# Patient Record
Sex: Female | Born: 1945
Health system: Southern US, Community
[De-identification: ages and names within clinical notes are randomized; demographics above are authoritative.]

## PROBLEM LIST (undated history)

## (undated) DIAGNOSIS — F988 Other specified behavioral and emotional disorders with onset usually occurring in childhood and adolescence: Secondary | ICD-10-CM

## (undated) DIAGNOSIS — F32A Depression, unspecified: Secondary | ICD-10-CM

## (undated) DIAGNOSIS — D649 Anemia, unspecified: Secondary | ICD-10-CM

## (undated) DIAGNOSIS — B009 Herpesviral infection, unspecified: Secondary | ICD-10-CM

## (undated) DIAGNOSIS — E785 Hyperlipidemia, unspecified: Secondary | ICD-10-CM

## (undated) DIAGNOSIS — H04123 Dry eye syndrome of bilateral lacrimal glands: Secondary | ICD-10-CM

## (undated) DIAGNOSIS — T7840XA Allergy, unspecified, initial encounter: Secondary | ICD-10-CM

## (undated) DIAGNOSIS — F329 Major depressive disorder, single episode, unspecified: Secondary | ICD-10-CM

## (undated) DIAGNOSIS — L718 Other rosacea: Secondary | ICD-10-CM

## (undated) DIAGNOSIS — C801 Malignant (primary) neoplasm, unspecified: Secondary | ICD-10-CM

## (undated) DIAGNOSIS — M8580 Other specified disorders of bone density and structure, unspecified site: Secondary | ICD-10-CM

## (undated) DIAGNOSIS — F419 Anxiety disorder, unspecified: Secondary | ICD-10-CM

## (undated) DIAGNOSIS — Z923 Personal history of irradiation: Secondary | ICD-10-CM

## (undated) DIAGNOSIS — M51369 Other intervertebral disc degeneration, lumbar region without mention of lumbar back pain or lower extremity pain: Secondary | ICD-10-CM

## (undated) DIAGNOSIS — K219 Gastro-esophageal reflux disease without esophagitis: Secondary | ICD-10-CM

## (undated) DIAGNOSIS — M5136 Other intervertebral disc degeneration, lumbar region: Secondary | ICD-10-CM

## (undated) HISTORY — DX: Allergy, unspecified, initial encounter: T78.40XA

## (undated) HISTORY — PX: TONSILLECTOMY AND ADENOIDECTOMY: SUR1326

## (undated) HISTORY — PX: SKIN BIOPSY: SHX1

## (undated) HISTORY — DX: Other intervertebral disc degeneration, lumbar region without mention of lumbar back pain or lower extremity pain: M51.369

## (undated) HISTORY — DX: Depression, unspecified: F32.A

## (undated) HISTORY — DX: Other specified disorders of bone density and structure, unspecified site: M85.80

## (undated) HISTORY — DX: Major depressive disorder, single episode, unspecified: F32.9

## (undated) HISTORY — DX: Anemia, unspecified: D64.9

## (undated) HISTORY — DX: Malignant (primary) neoplasm, unspecified: C80.1

## (undated) HISTORY — DX: Other specified behavioral and emotional disorders with onset usually occurring in childhood and adolescence: F98.8

## (undated) HISTORY — DX: Other rosacea: L71.8

## (undated) HISTORY — DX: Dry eye syndrome of bilateral lacrimal glands: H04.123

## (undated) HISTORY — PX: UPPER GI ENDOSCOPY: SHX6162

## (undated) HISTORY — DX: Herpesviral infection, unspecified: B00.9

## (undated) HISTORY — DX: Anxiety disorder, unspecified: F41.9

## (undated) HISTORY — DX: Other intervertebral disc degeneration, lumbar region: M51.36

## (undated) HISTORY — DX: Gastro-esophageal reflux disease without esophagitis: K21.9

---

## 2002-01-16 ENCOUNTER — Other Ambulatory Visit: Admission: RE | Admit: 2002-01-16 | Discharge: 2002-01-16 | Payer: Self-pay | Admitting: Gynecology

## 2003-03-28 ENCOUNTER — Other Ambulatory Visit: Admission: RE | Admit: 2003-03-28 | Discharge: 2003-03-28 | Payer: Self-pay | Admitting: Gynecology

## 2004-05-10 ENCOUNTER — Other Ambulatory Visit: Admission: RE | Admit: 2004-05-10 | Discharge: 2004-05-10 | Payer: Self-pay | Admitting: Obstetrics and Gynecology

## 2004-06-14 LAB — HM COLONOSCOPY

## 2005-05-30 ENCOUNTER — Other Ambulatory Visit: Admission: RE | Admit: 2005-05-30 | Discharge: 2005-05-30 | Payer: Self-pay | Admitting: Obstetrics and Gynecology

## 2005-12-22 ENCOUNTER — Ambulatory Visit (HOSPITAL_COMMUNITY): Admission: RE | Admit: 2005-12-22 | Discharge: 2005-12-22 | Payer: Self-pay | Admitting: Internal Medicine

## 2011-09-18 ENCOUNTER — Emergency Department (HOSPITAL_COMMUNITY)
Admission: EM | Admit: 2011-09-18 | Discharge: 2011-09-19 | Disposition: A | Discharge status: Home / Self Care | Payer: Self-pay | Attending: Emergency Medicine | Admitting: Emergency Medicine

## 2011-09-18 DIAGNOSIS — L259 Unspecified contact dermatitis, unspecified cause: Secondary | ICD-10-CM | POA: Insufficient documentation

## 2011-09-19 MED FILL — Prednisone Tab 20 MG: ORAL | Qty: 3 | Status: AC

## 2011-09-19 NOTE — ED Notes (Signed)
See downtime charting. 

## 2011-12-28 ENCOUNTER — Ambulatory Visit (HOSPITAL_COMMUNITY)
Admission: RE | Admit: 2011-12-28 | Discharge: 2011-12-28 | Disposition: A | Discharge status: Home / Self Care | Payer: 59 | Source: Ambulatory Visit | Attending: Internal Medicine | Admitting: Internal Medicine

## 2011-12-28 ENCOUNTER — Other Ambulatory Visit (HOSPITAL_COMMUNITY): Payer: Self-pay | Admitting: Internal Medicine

## 2011-12-28 DIAGNOSIS — R05 Cough: Secondary | ICD-10-CM

## 2011-12-28 DIAGNOSIS — R059 Cough, unspecified: Secondary | ICD-10-CM | POA: Insufficient documentation

## 2011-12-28 DIAGNOSIS — Z0000 Encounter for general adult medical examination without abnormal findings: Secondary | ICD-10-CM | POA: Insufficient documentation

## 2012-12-31 ENCOUNTER — Other Ambulatory Visit: Payer: Self-pay | Admitting: Internal Medicine

## 2012-12-31 LAB — CBC WITH DIFFERENTIAL/PLATELET
Eosinophils Relative: 4 % (ref 0–5)
Hemoglobin: 14.2 g/dL (ref 12.0–15.0)
Lymphocytes Relative: 26 % (ref 12–46)
Lymphs Abs: 1.3 10*3/uL (ref 0.7–4.0)
MCV: 92.1 fL (ref 78.0–100.0)
Neutrophils Relative %: 58 % (ref 43–77)
Platelets: 202 10*3/uL (ref 150–400)
RBC: 4.45 MIL/uL (ref 3.87–5.11)
WBC: 5 10*3/uL (ref 4.0–10.5)

## 2012-12-31 LAB — BASIC METABOLIC PANEL WITH GFR
CO2: 29 mEq/L (ref 19–32)
Chloride: 102 mEq/L (ref 96–112)
Creat: 0.77 mg/dL (ref 0.50–1.10)
Glucose, Bld: 99 mg/dL (ref 70–99)

## 2012-12-31 LAB — HEPATIC FUNCTION PANEL
ALT: 17 U/L (ref 0–35)
AST: 15 U/L (ref 0–37)
Albumin: 4.4 g/dL (ref 3.5–5.2)
Total Protein: 6.6 g/dL (ref 6.0–8.3)

## 2012-12-31 LAB — MAGNESIUM: Magnesium: 2.1 mg/dL (ref 1.5–2.5)

## 2012-12-31 LAB — LIPID PANEL
Cholesterol: 287 mg/dL — ABNORMAL HIGH (ref 0–200)
HDL: 93 mg/dL (ref >39)
Total CHOL/HDL Ratio: 3.1 Ratio

## 2013-01-01 LAB — URINALYSIS, ROUTINE W REFLEX MICROSCOPIC
Bilirubin Urine: NEGATIVE
Ketones, ur: NEGATIVE mg/dL
Nitrite: NEGATIVE
Specific Gravity, Urine: 1.016 (ref 1.005–1.030)
pH: 6.5 (ref 5.0–8.0)

## 2013-01-01 LAB — MICROALBUMIN / CREATININE URINE RATIO: Microalb, Ur: 0.5 mg/dL (ref 0.00–1.89)

## 2013-02-15 ENCOUNTER — Other Ambulatory Visit: Payer: Self-pay | Admitting: Physician Assistant

## 2013-02-15 MED ORDER — AMPHETAMINE-DEXTROAMPHETAMINE 30 MG PO TABS: 30.0000 mg | ORAL_TABLET | Freq: Three times a day (TID) | ORAL | Status: DC

## 2013-06-10 ENCOUNTER — Other Ambulatory Visit: Payer: Self-pay | Admitting: Emergency Medicine

## 2013-06-26 ENCOUNTER — Encounter: Payer: Self-pay | Admitting: *Deleted

## 2013-06-26 DIAGNOSIS — T7840XA Allergy, unspecified, initial encounter: Secondary | ICD-10-CM | POA: Insufficient documentation

## 2013-06-26 DIAGNOSIS — F988 Other specified behavioral and emotional disorders with onset usually occurring in childhood and adolescence: Secondary | ICD-10-CM | POA: Insufficient documentation

## 2013-06-26 DIAGNOSIS — D649 Anemia, unspecified: Secondary | ICD-10-CM | POA: Insufficient documentation

## 2013-06-26 DIAGNOSIS — L718 Other rosacea: Secondary | ICD-10-CM | POA: Insufficient documentation

## 2013-06-26 DIAGNOSIS — M8580 Other specified disorders of bone density and structure, unspecified site: Secondary | ICD-10-CM | POA: Insufficient documentation

## 2013-06-26 DIAGNOSIS — M5136 Other intervertebral disc degeneration, lumbar region: Secondary | ICD-10-CM | POA: Insufficient documentation

## 2013-06-26 DIAGNOSIS — K219 Gastro-esophageal reflux disease without esophagitis: Secondary | ICD-10-CM | POA: Insufficient documentation

## 2013-06-26 DIAGNOSIS — M51369 Other intervertebral disc degeneration, lumbar region without mention of lumbar back pain or lower extremity pain: Secondary | ICD-10-CM | POA: Insufficient documentation

## 2013-06-26 DIAGNOSIS — B009 Herpesviral infection, unspecified: Secondary | ICD-10-CM | POA: Insufficient documentation

## 2013-06-26 DIAGNOSIS — F325 Major depressive disorder, single episode, in full remission: Secondary | ICD-10-CM | POA: Insufficient documentation

## 2013-06-27 ENCOUNTER — Ambulatory Visit (INDEPENDENT_AMBULATORY_CARE_PROVIDER_SITE_OTHER): Payer: Medicare Other | Admitting: Physician Assistant

## 2013-06-27 ENCOUNTER — Encounter: Payer: Self-pay | Admitting: Physician Assistant

## 2013-06-27 VITALS — BP 132/80 | HR 72 | Temp 97.9°F | Resp 16 | Wt 184.0 lb

## 2013-06-27 DIAGNOSIS — Z79899 Other long term (current) drug therapy: Secondary | ICD-10-CM

## 2013-06-27 DIAGNOSIS — E785 Hyperlipidemia, unspecified: Secondary | ICD-10-CM | POA: Insufficient documentation

## 2013-06-27 DIAGNOSIS — Z0000 Encounter for general adult medical examination without abnormal findings: Secondary | ICD-10-CM

## 2013-06-27 DIAGNOSIS — Z789 Other specified health status: Secondary | ICD-10-CM

## 2013-06-27 DIAGNOSIS — F988 Other specified behavioral and emotional disorders with onset usually occurring in childhood and adolescence: Secondary | ICD-10-CM

## 2013-06-27 DIAGNOSIS — F32A Depression, unspecified: Secondary | ICD-10-CM

## 2013-06-27 DIAGNOSIS — E559 Vitamin D deficiency, unspecified: Secondary | ICD-10-CM

## 2013-06-27 DIAGNOSIS — F329 Major depressive disorder, single episode, unspecified: Secondary | ICD-10-CM

## 2013-06-27 DIAGNOSIS — T7840XA Allergy, unspecified, initial encounter: Secondary | ICD-10-CM

## 2013-06-27 LAB — CBC WITH DIFFERENTIAL/PLATELET
BASOS ABS: 0 10*3/uL (ref 0.0–0.1)
BASOS PCT: 0 % (ref 0–1)
EOS ABS: 0.2 10*3/uL (ref 0.0–0.7)
Eosinophils Relative: 3 % (ref 0–5)
HCT: 39.3 % (ref 36.0–46.0)
HEMOGLOBIN: 13.3 g/dL (ref 12.0–15.0)
Lymphocytes Relative: 24 % (ref 12–46)
Lymphs Abs: 1.5 10*3/uL (ref 0.7–4.0)
MCH: 31.6 pg (ref 26.0–34.0)
MCHC: 33.8 g/dL (ref 30.0–36.0)
MCV: 93.3 fL (ref 78.0–100.0)
MONO ABS: 0.7 10*3/uL (ref 0.1–1.0)
MONOS PCT: 11 % (ref 3–12)
NEUTROS ABS: 3.8 10*3/uL (ref 1.7–7.7)
NEUTROS PCT: 62 % (ref 43–77)
Platelets: 209 10*3/uL (ref 150–400)
RBC: 4.21 MIL/uL (ref 3.87–5.11)
RDW: 12.7 % (ref 11.5–15.5)
WBC: 6.2 10*3/uL (ref 4.0–10.5)

## 2013-06-27 LAB — HEPATIC FUNCTION PANEL
ALT: 16 U/L (ref 0–35)
AST: 16 U/L (ref 0–37)
Albumin: 4.2 g/dL (ref 3.5–5.2)
Alkaline Phosphatase: 64 U/L (ref 39–117)
Bilirubin, Direct: 0.1 mg/dL (ref 0.0–0.3)
Indirect Bilirubin: 0.6 mg/dL (ref 0.2–1.2)
TOTAL PROTEIN: 6.4 g/dL (ref 6.0–8.3)
Total Bilirubin: 0.7 mg/dL (ref 0.2–1.2)

## 2013-06-27 LAB — BASIC METABOLIC PANEL WITH GFR
BUN: 17 mg/dL (ref 6–23)
CALCIUM: 9.1 mg/dL (ref 8.4–10.5)
CO2: 28 mEq/L (ref 19–32)
Chloride: 103 mEq/L (ref 96–112)
Creat: 0.87 mg/dL (ref 0.50–1.10)
GFR, EST AFRICAN AMERICAN: 81 mL/min
GFR, EST NON AFRICAN AMERICAN: 70 mL/min
GLUCOSE: 87 mg/dL (ref 70–99)
Potassium: 4.3 mEq/L (ref 3.5–5.3)
SODIUM: 140 meq/L (ref 135–145)

## 2013-06-27 LAB — LIPID PANEL
CHOLESTEROL: 265 mg/dL — AB (ref 0–200)
HDL: 84 mg/dL (ref >39)
LDL Cholesterol: 159 mg/dL — ABNORMAL HIGH (ref 0–99)
TRIGLYCERIDES: 110 mg/dL (ref <150)
Total CHOL/HDL Ratio: 3.2 Ratio
VLDL: 22 mg/dL (ref 0–40)

## 2013-06-27 LAB — TSH: TSH: 2.101 u[IU]/mL (ref 0.350–4.500)

## 2013-06-27 LAB — MAGNESIUM: Magnesium: 2.1 mg/dL (ref 1.5–2.5)

## 2013-06-27 MED ORDER — AMPHETAMINE-DEXTROAMPHETAMINE 30 MG PO TABS: 30.0000 mg | ORAL_TABLET | Freq: Three times a day (TID) | ORAL | Status: DC

## 2013-06-27 NOTE — Patient Instructions (Signed)
Your LDL is not in range. Your LDL is the bad cholesterol that can lead to heart attack and stroke. To lower your number you can decrease your fatty foods, red meat, cheese, milk and increase fiber like whole grains and veggies. You can also add a fiber supplement Benefiber.     Bad carbs also include fruit juice, alcohol, and sweet tea. These are empty calories that do not signal to your brain that you are full.   Please remember the good carbs are still carbs which convert into sugar. So please measure them out no more than 1/2-1 cup of rice, oatmeal, pasta, and beans.  Veggies are however free foods! Pile them on.   I like lean protein at every meal such as chicken, [place], pork chops, cottage cheese, etc. Just do not fry these meats and please center your meal around vegetable, the meats should be a side dish.   No all fruit is created equal. Please see the list below, the fruit at the bottom is higher in sugars than the fruit at the top   Preventative Care for Adults - Female      MAINTAIN REGULAR HEALTH EXAMS:  A routine yearly physical is a good way to check in with your primary care provider about your health and preventive screening. It is also an opportunity to share updates about your health and any concerns you have, and receive a thorough all-over exam.   Most health insurance companies pay for at least some preventative services.  Check with your health plan for specific coverages.  WHAT PREVENTATIVE SERVICES DO WOMEN NEED?  Adult women should have their weight and blood pressure checked regularly.   Women age 70 and older should have their cholesterol levels checked regularly.  Women should be screened for cervical cancer with a Pap smear and pelvic exam beginning at either age 51, or 3 years after they become sexually activity.    Breast cancer screening generally begins at age 25 with a mammogram and breast exam by your primary care provider.    Beginning at age 62 and  continuing to age 32, women should be screened for colorectal cancer.  Certain people may need continued testing until age 50.  Updating vaccinations is part of preventative care.  Vaccinations help protect against diseases such as the flu.  Osteoporosis is a disease in which the bones lose minerals and strength as we age. Women ages 70 and over should discuss this with their caregivers, as should women after menopause who have other risk factors.  Lab tests are generally done as part of preventative care to screen for anemia and blood disorders, to screen for problems with the kidneys and liver, to screen for bladder problems, to check blood sugar, and to check your cholesterol level.  Preventative services generally include counseling about diet, exercise, avoiding tobacco, drugs, excessive alcohol consumption, and sexually transmitted infections.    GENERAL RECOMMENDATIONS FOR GOOD HEALTH:  Healthy diet:  Eat a variety of foods, including fruit, vegetables, animal or vegetable protein, such as meat, fish, chicken, and eggs, or beans, lentils, tofu, and grains, such as rice.  Drink plenty of water daily.  Decrease saturated fat in the diet, avoid lots of red meat, processed foods, sweets, fast foods, and fried foods.  Exercise:  Aerobic exercise helps maintain good heart health. At least 30-40 minutes of moderate-intensity exercise is recommended. For example, a brisk walk that increases your heart rate and breathing. This should be done on most  days of the week.   Find a type of exercise or a variety of exercises that you enjoy so that it becomes a part of your daily life.  Examples are running, walking, swimming, water aerobics, and biking.  For motivation and support, explore group exercise such as aerobic class, spin class, Zumba, Yoga,or  martial arts, etc.    Set exercise goals for yourself, such as a certain weight goal, walk or run in a race such as a 5k walk/run.  Speak to your  primary care provider about exercise goals.  Disease prevention:  If you smoke or chew tobacco, find out from your caregiver how to quit. It can literally save your life, no matter how long you have been a tobacco user. If you do not use tobacco, never begin.   Maintain a healthy diet and normal weight. Increased weight leads to problems with blood pressure and diabetes.   The Body Mass Index or BMI is a way of measuring how much of your body is fat. Having a BMI above 27 increases the risk of heart disease, diabetes, hypertension, stroke and other problems related to obesity. Your caregiver can help determine your BMI and based on it develop an exercise and dietary program to help you achieve or maintain this important measurement at a healthful level.  High blood pressure causes heart and blood vessel problems.  Persistent high blood pressure should be treated with medicine if weight loss and exercise do not work.   Fat and cholesterol leaves deposits in your arteries that can block them. This causes heart disease and vessel disease elsewhere in your body.  If your cholesterol is found to be high, or if you have heart disease or certain other medical conditions, then you may need to have your cholesterol monitored frequently and be treated with medication.   Ask if you should have a cardiac stress test if your history suggests this. A stress test is a test done on a treadmill that looks for heart disease. This test can find disease prior to there being a problem.  Menopause can be associated with physical symptoms and risks. Hormone replacement therapy is available to decrease these. You should talk to your caregiver about whether starting or continuing to take hormones is right for you.   Osteoporosis is a disease in which the bones lose minerals and strength as we age. This can result in serious bone fractures. Risk of osteoporosis can be identified using a bone density scan. Women ages 63 and  over should discuss this with their caregivers, as should women after menopause who have other risk factors. Ask your caregiver whether you should be taking a calcium supplement and Vitamin D, to reduce the rate of osteoporosis.   Avoid drinking alcohol in excess (more than two drinks per day).  Avoid use of street drugs. Do not share needles with anyone. Ask for professional help if you need assistance or instructions on stopping the use of alcohol, cigarettes, and/or drugs.  Brush your teeth twice a day with fluoride toothpaste, and floss once a day. Good oral hygiene prevents tooth decay and gum disease. The problems can be painful, unattractive, and can cause other health problems. Visit your dentist for a routine oral and dental check up and preventive care every 6-12 months.   Look at your skin regularly.  Use a mirror to look at your back. Notify your caregivers of changes in moles, especially if there are changes in shapes, colors, a size larger  than a pencil eraser, an irregular border, or development of new moles.  Safety:  Use seatbelts 100% of the time, whether driving or as a passenger.  Use safety devices such as hearing protection if you work in environments with loud noise or significant background noise.  Use safety glasses when doing any work that could send debris in to the eyes.  Use a helmet if you ride a bike or motorcycle.  Use appropriate safety gear for contact sports.  Talk to your caregiver about gun safety.  Use sunscreen with a SPF (or skin protection factor) of 15 or greater.  Lighter skinned people are at a greater risk of skin cancer. Don't forget to also wear sunglasses in order to protect your eyes from too much damaging sunlight. Damaging sunlight can accelerate cataract formation.   Practice safe sex. Use condoms. Condoms are used for birth control and to help reduce the spread of sexually transmitted infections (or STIs).  Some of the STIs are gonorrhea (the clap),  chlamydia, syphilis, trichomonas, herpes, HPV (human papilloma virus) and HIV (human immunodeficiency virus) which causes AIDS. The herpes, HIV and HPV are viral illnesses that have no cure. These can result in disability, cancer and death.   Keep carbon monoxide and smoke detectors in your home functioning at all times. Change the batteries every 6 months or use a model that plugs into the wall.   Vaccinations:  Stay up to date with your tetanus shots and other required immunizations. You should have a booster for tetanus every 10 years. Be sure to get your flu shot every year, since 5%-20% of the [place] population comes down with the flu. The flu vaccine changes each year, so being vaccinated once is not enough. Get your shot in the fall, before the flu season peaks.   Other vaccines to consider:  Human Papilloma Virus or HPV causes cancer of the cervix, and other infections that can be transmitted from person to person. There is a vaccine for HPV, and females should get immunized between the ages of 77 and 20. It requires a series of 3 shots.   Pneumococcal vaccine to protect against certain types of pneumonia.  This is normally recommended for adults age 28 or older.  However, adults younger than 66 years old with certain underlying conditions such as diabetes, heart or lung disease should also receive the vaccine.  Shingles vaccine to protect against Varicella Zoster if you are older than age 4, or younger than 66 years old with certain underlying illness.  Hepatitis A vaccine to protect against a form of infection of the liver by a virus acquired from food.  Hepatitis B vaccine to protect against a form of infection of the liver by a virus acquired from blood or body fluids, particularly if you work in health care.  If you plan to travel internationally, check with your local health department for specific vaccination recommendations.  Cancer Screening:  Breast cancer screening is  essential to preventive care for women. All women age 59 and older should perform a breast self-exam every month. At age 14 and older, women should have their caregiver complete a breast exam each year. Women at ages 67 and older should have a mammogram (x-ray film) of the breasts. Your caregiver can discuss how often you need mammograms.    Cervical cancer screening includes taking a Pap smear (sample of cells examined under a microscope) from the cervix (end of the uterus). It also includes testing for HPV (  Human Papilloma Virus, which can cause cervical cancer). Screening and a pelvic exam should begin at age 46, or 3 years after a woman becomes sexually active. Screening should occur every year, with a Pap smear but no HPV testing, up to age 48. After age 9, you should have a Pap smear every 3 years with HPV testing, if no HPV was found previously.   Most routine colon cancer screening begins at the age of 37. On a yearly basis, doctors may provide special easy to use take-home tests to check for hidden blood in the stool. Sigmoidoscopy or colonoscopy can detect the earliest forms of colon cancer and is life saving. These tests use a small camera at the end of a tube to directly examine the colon. Speak to your caregiver about this at age 58, when routine screening begins (and is repeated every 5 years unless early forms of pre-cancerous polyps or small growths are found).

## 2013-06-27 NOTE — Progress Notes (Signed)
Subjective:   Kathy Cook is a 68 y.o. female who presents for Medicare Annual Wellness Visit and 3 month follow up on hypertension, hyperlipidemia, vitamin D def.  Date of last medicare wellness visit is unknown.   Her blood pressure has been controlled at home, today their BP is   She does workout, walks. She denies chest pain, shortness of breath, dizziness.  She is not on cholesterol medication, can not tolerate statins and stopped the zetia because she decreased meat and wanted to try diet. Her cholesterol is not at goal. The cholesterol last visit was:   Lab Results  Component Value Date   CHOL 287* 12/31/2012   HDL 93 12/31/2012   LDLCALC 169* 12/31/2012   TRIG 123 12/31/2012   CHOLHDL 3.1 12/31/2012   Last A1C in the office was:  Lab Results  Component Value Date   HGBA1C 5.6 12/31/2012   Patient is on Vitamin D supplement. She does well on the Adderall, helps with concentration.  Has leg cramping at night.  Has some stress incontinence last 2-3 weeks with sneezing, does not want to check urine and does not want meds.   Names of Other Physician/Practitioners you currently use: 1. [facility]- here for primary care 2. [facility], eye doctor, last visit once yearly 3.  Dr. Johnnye Sima, dentist, last visit q 6 months, doing invisiline Patient Care Team: Unk Pinto, MD as PCP - General (Internal Medicine) Cyril Mourning, MD as Consulting Physician (Obstetrics and Gynecology) Deliah Goody, MD as Consulting Physician (Ophthalmology) Lindwood Coke, MD as Consulting Physician (Dermatology)  Medication Review Current Outpatient Prescriptions on File Prior to Visit  Medication Sig Dispense Refill  . Cholecalciferol (VITAMIN D3) 5000 UNITS TABS Take 10,000 Units by mouth.      . cycloSPORINE (RESTASIS) 0.05 % ophthalmic emulsion 1 drop 2 (two) times daily.      Marland Kitchen estradiol (ESTRACE) 0.1 MG/GM vaginal cream Place 1 Applicatorful vaginally at  bedtime.      . famciclovir (FAMVIR) 500 MG tablet Take by mouth as needed.      . hydrochlorothiazide (HYDRODIURIL) 25 MG tablet Take 25 mg by mouth daily as needed.      . Omega-3 Fatty Acids (FISH OIL) 1000 MG CAPS Take by mouth daily.      . Progesterone Micronized 10 % CREA Place onto the skin.       No current facility-administered medications on file prior to visit.    Current Problems (verified) Patient Active Problem List   Diagnosis Date Noted  . Other and unspecified hyperlipidemia 06/27/2013  . Dry eyes   . Attention deficit disorder (ADD)   . GERD (gastroesophageal reflux disease)   . Allergy   . Depression   . Anxiety   . Anemia   . Ocular rosacea   . HSV infection   . Osteopenia   . Lumbar degenerative disc disease     Screening Tests Health Maintenance  Topic Date Due  . Mammogram  02/09/1998  . Colonoscopy  02/09/1998  . Zostavax  02/10/2008  . Pneumococcal Polysaccharide Vaccine Age 39 And Over  02/09/2013  . Influenza Vaccine  09/28/2013  . Tetanus/tdap  03/29/2014     Immunization History  Administered Date(s) Administered  . Pneumococcal-Unspecified 12/31/2012  . Td 03/29/2004    Preventative care: Last colonoscopy: 2006 due 2016 Last mammogram: 11/2012 Last pap smear/pelvic exam: 11/2012 DEXA 07/2012  Prior vaccinations: TD or Tdap: 2006  Influenza: declines Pneumococcal: 2014  Shingles/Zostavax: check insurance  History reviewed: allergies, current medications, past family history, past medical history, past social history, past surgical history and problem list  Risk Factors: Osteoporosis: postmenopausal estrogen deficiency and dietary calcium and/or vitamin D deficiency History of fracture in the past year: no  Tobacco History  Substance Use Topics  . Smoking status: Former Smoker -- 0.50 packs/day for 15 years    Types: Cigarettes  . Smokeless tobacco: Never Used  . Alcohol Use: Yes     Comment: social   She does not  smoke.  Patient is a former smoker. Are there smokers in your home (other than you)?  No  Alcohol Current alcohol use: social drinker  Caffeine Current caffeine use: coffee 1-2 /day  Exercise Exercise limitations: The patient has no exercise limitations. Current exercise: walking  Nutrition/Diet Current diet: in general, a "healthy" diet    Cardiac risk factors: advanced age (older than 9 for men, 50 for women) and dyslipidemia.  Depression Screen (Note: if answer to either of the following is "Yes", a more complete depression screening is indicated)   Q1: Over the past two weeks, have you felt down, depressed or hopeless? No  Q2: Over the past two weeks, have you felt little interest or pleasure in doing things? No  Have you lost interest or pleasure in daily life? No  Do you often feel hopeless? No  Do you cry easily over simple problems? No  Activities of Daily Living In your present state of health, do you have any difficulty performing the following activities?:  Driving? No Managing money?  No Feeding yourself? No Getting from bed to chair? No Climbing a flight of stairs? No Preparing food and eating?: No Bathing or showering? No Getting dressed: No Getting to the toilet? No Using the toilet:No Moving around from place to place: No In the past year have you fallen or had a near fall?:No   Are you sexually active?  Yes  Do you have more than one partner?  No  Vision Difficulties: No  Hearing Difficulties: No Do you often ask people to speak up or repeat themselves? No Do you experience ringing or noises in your ears? No Do you have difficulty understanding soft or whispered voices? No  Cognition  Do you feel that you have a problem with memory?No  Do you often misplace items? No  Do you feel safe at home?  Yes  Advanced directives Does patient have a [place]ines? No Does patient have a Living Will? No   Objective:   Pulse 72,  temperature 97.9 F (36.6 C), resp. rate 16, weight 184 lb (83.462 kg). There is no height on file to calculate BMI.  General appearance: alert, no distress, WD/WN,  female Cognitive Testing  Alert? Yes  Normal Appearance?Yes  Oriented to person? Yes  Place? Yes   Time? Yes  Recall of three objects?  Yes  Can perform simple calculations? Yes  Displays appropriate judgment?Yes  Can read the correct time from a watch face?Yes  HEENT: normocephalic, sclerae anicteric, TMs pearly, nares patent, no discharge or erythema, pharynx normal Oral cavity: MMM, no lesions Neck: supple, no lymphadenopathy, no thyromegaly, no masses Heart: RRR, normal S1, S2, no murmurs Lungs: CTA bilaterally, no wheezes, rhonchi, or rales Abdomen: +bs, soft, non tender, non distended, no masses, no hepatomegaly, no splenomegaly Musculoskeletal: nontender, no swelling, no obvious deformity Extremities: no edema, no cyanosis, no clubbing Pulses: 2+ symmetric, upper and lower extremities, normal cap  refill Neurological: alert, oriented x 3, CN2-12 intact, strength normal upper extremities and lower extremities, sensation normal throughout, DTRs 2+ throughout, no cerebellar signs, gait normal Psychiatric: normal affect, behavior normal, pleasant  Breast: defer Gyn: defer Rectal: defer   Assessment:   1. Attention deficit disorder (ADD) Given RX  2. Allergy  Allegra OTC, increase H20, allergy hygiene explained.  3. Other and unspecified hyperlipidemia - long discussion about risks of not taking a medication for chol such as stroke, MI, and death. Can not tolerate statins, recheck and possibly willing to retry zetia -CBC with Differential - BASIC METABOLIC PANEL WITH GFR - Hepatic function panel - Lipid panel - TSH  4. Encounter for long-term (current) use of other medications - Magnesium  5. Unspecified vitamin D deficiency - Vit D  25 hydroxy (rtn osteoporosis monitoring)    Plan:   During the  course of the visit the patient was educated and counseled about appropriate screening and preventive services including:    Pneumococcal vaccine   Influenza vaccine  Td vaccine  Screening electrocardiogram  Screening mammography  Bone densitometry screening  Colorectal cancer screening  Diabetes screening  Glaucoma screening  Nutrition counseling   Advanced directives: given info  Screening recommendations, referrals:  Vaccinations: Tdap vaccine not indicated Influenza vaccine not indicated Pneumococcal vaccine not indicated Shingles vaccine too expensive Hep B vaccine not indicated  Nutrition assessed and recommended  Colonoscopy not indicated Mammogram not indicated Pap smear not indicated Pelvic exam not indicated Recommended yearly ophthalmology/optometry visit for glaucoma screening and checkup Recommended yearly dental visit for hygiene and checkup Advanced directives - given info  Conditions/risks identified: BMI: Discussed weight loss, diet, and increase physical activity.  Increase physical activity: AHA recommends 150 minutes of physical activity a week.  Medications reviewed DEXA- does at OB/GYN Urinary Incontinence is an issue, stress incontinence, declines UA/C&S and meds: discussed non pharmacology and pharmacology options.  Fall risk: low- discussed PT, home fall assessment, medications.   Medicare Attestation I have personally reviewed: The patient's medical and social history Their use of alcohol, tobacco or illicit drugs Their current medications and supplements The patient's functional ability including ADLs,fall risks, home safety risks, cognitive, and hearing and visual impairment Diet and physical activities Evidence for depression or mood disorders  The patient's weight, height, BMI, and visual acuity have been recorded in the chart.  I have made referrals, counseling, and provided education to the patient based on review of the  above and I have provided the patient with a written personalized care plan for preventive services.     Vicie Mutters, PA-C   06/27/2013

## 2013-06-28 LAB — VITAMIN D 25 HYDROXY (VIT D DEFICIENCY, FRACTURES): VIT D 25 HYDROXY: 54 ng/mL (ref 30–89)

## 2013-10-07 ENCOUNTER — Other Ambulatory Visit: Payer: Self-pay | Admitting: Physician Assistant

## 2013-10-07 MED ORDER — AMPHETAMINE-DEXTROAMPHETAMINE 30 MG PO TABS: 30.0000 mg | ORAL_TABLET | Freq: Three times a day (TID) | ORAL | Status: DC

## 2013-12-31 ENCOUNTER — Other Ambulatory Visit: Payer: Self-pay

## 2013-12-31 ENCOUNTER — Ambulatory Visit (INDEPENDENT_AMBULATORY_CARE_PROVIDER_SITE_OTHER): Payer: Medicare Other | Admitting: Physician Assistant

## 2013-12-31 ENCOUNTER — Encounter: Payer: Self-pay | Admitting: Physician Assistant

## 2013-12-31 VITALS — BP 138/82 | HR 68 | Temp 98.6°F | Resp 16 | Ht 62.0 in | Wt 190.0 lb

## 2013-12-31 DIAGNOSIS — F988 Other specified behavioral and emotional disorders with onset usually occurring in childhood and adolescence: Secondary | ICD-10-CM

## 2013-12-31 DIAGNOSIS — F32A Depression, unspecified: Secondary | ICD-10-CM

## 2013-12-31 DIAGNOSIS — E559 Vitamin D deficiency, unspecified: Secondary | ICD-10-CM

## 2013-12-31 DIAGNOSIS — F329 Major depressive disorder, single episode, unspecified: Secondary | ICD-10-CM

## 2013-12-31 DIAGNOSIS — Z79899 Other long term (current) drug therapy: Secondary | ICD-10-CM

## 2013-12-31 DIAGNOSIS — F909 Attention-deficit hyperactivity disorder, unspecified type: Secondary | ICD-10-CM

## 2013-12-31 DIAGNOSIS — E785 Hyperlipidemia, unspecified: Secondary | ICD-10-CM

## 2013-12-31 LAB — HEPATIC FUNCTION PANEL
ALK PHOS: 55 U/L (ref 39–117)
ALT: 12 U/L (ref 0–35)
AST: 13 U/L (ref 0–37)
Albumin: 3.9 g/dL (ref 3.5–5.2)
BILIRUBIN INDIRECT: 0.4 mg/dL (ref 0.2–1.2)
Bilirubin, Direct: 0.1 mg/dL (ref 0.0–0.3)
TOTAL PROTEIN: 6.1 g/dL (ref 6.0–8.3)
Total Bilirubin: 0.5 mg/dL (ref 0.2–1.2)

## 2013-12-31 LAB — CBC WITH DIFFERENTIAL/PLATELET
BASOS ABS: 0.1 10*3/uL (ref 0.0–0.1)
Basophils Relative: 1 % (ref 0–1)
Eosinophils Absolute: 0.2 10*3/uL (ref 0.0–0.7)
Eosinophils Relative: 3 % (ref 0–5)
HEMATOCRIT: 39.2 % (ref 36.0–46.0)
HEMOGLOBIN: 13.4 g/dL (ref 12.0–15.0)
LYMPHS ABS: 1.1 10*3/uL (ref 0.7–4.0)
LYMPHS PCT: 21 % (ref 12–46)
MCH: 32 pg (ref 26.0–34.0)
MCHC: 34.2 g/dL (ref 30.0–36.0)
MCV: 93.6 fL (ref 78.0–100.0)
MONO ABS: 0.5 10*3/uL (ref 0.1–1.0)
MONOS PCT: 9 % (ref 3–12)
NEUTROS ABS: 3.6 10*3/uL (ref 1.7–7.7)
Neutrophils Relative %: 66 % (ref 43–77)
Platelets: 205 10*3/uL (ref 150–400)
RBC: 4.19 MIL/uL (ref 3.87–5.11)
RDW: 13.2 % (ref 11.5–15.5)
WBC: 5.4 10*3/uL (ref 4.0–10.5)

## 2013-12-31 LAB — LIPID PANEL
CHOL/HDL RATIO: 2.9 ratio
Cholesterol: 258 mg/dL — ABNORMAL HIGH (ref 0–200)
HDL: 89 mg/dL (ref >39)
LDL CALC: 143 mg/dL — AB (ref 0–99)
Triglycerides: 128 mg/dL (ref <150)
VLDL: 26 mg/dL (ref 0–40)

## 2013-12-31 LAB — BASIC METABOLIC PANEL WITH GFR
BUN: 19 mg/dL (ref 6–23)
CHLORIDE: 106 meq/L (ref 96–112)
CO2: 28 meq/L (ref 19–32)
Calcium: 9.1 mg/dL (ref 8.4–10.5)
Creat: 0.73 mg/dL (ref 0.50–1.10)
GFR, EST NON AFRICAN AMERICAN: 87 mL/min
GFR, Est African American: >89 mL/min
Glucose, Bld: 86 mg/dL (ref 70–99)
POTASSIUM: 4.5 meq/L (ref 3.5–5.3)
Sodium: 141 mEq/L (ref 135–145)

## 2013-12-31 LAB — TSH: TSH: 2.602 u[IU]/mL (ref 0.350–4.500)

## 2013-12-31 LAB — MAGNESIUM: Magnesium: 2 mg/dL (ref 1.5–2.5)

## 2013-12-31 NOTE — Patient Instructions (Addendum)
Benefiber is good for constipation/diarrhea/irritable bowel syndrome, it helps with weight loss and can help lower your bad cholesterol. Please do 1-2 TBSP in the morning in water, coffee, or tea. It can take up to a month before you can see a difference with your bowel movements. It is cheapest from [employer], [employer], [employer].   Your LDL is not in range. Your LDL is the bad cholesterol that can lead to heart attack and stroke. To lower your number you can decrease your fatty foods, red meat, cheese, milk and increase fiber like whole grains and veggies. You can also add a fiber supplement like Metamucil or Benefiber.    Lisdexamfetamine Oral Capsule What is this medicine? LISDEXAMFETAMINE (lis DEX am fet a meen) is used to treat attention-deficit hyperactivity disorder (ADHD) in adults and children. It is also used to treat binge-eating disorder in adults. Federal law prohibits giving this medicine to any person other than the person for whom it was prescribed. Do not share this medicine with anyone else. This medicine may be used for other purposes; ask your health care provider or pharmacist if you have questions. COMMON BRAND NAME(S): Vyvanse What should I tell my health care provider before I take this medicine? They need to know if you have any of these conditions: -anxiety or panic attacks -circulation problems in fingers and toes -glaucoma -hardening or blockages of the arteries or heart blood vessels -heart disease or a heart defect -high blood pressure -history of a drug or alcohol abuse problem -history of stroke -kidney disease -liver disease -mental illness -seizures -suicidal thoughts, plans, or attempt; a previous suicide attempt by you or a family member -thyroid disease -Tourette's syndrome -an unusual or allergic reaction to lisdexamfetamine, other medicines, foods, dyes, or preservatives -pregnant or trying to get pregnant -breast-feeding How should I use this  medicine? Take this medicine by mouth. Follow the directions on the prescription label. Swallow the capsules with a drink of water. You may open capsule and add to a glass of water, then drink right away. Take your doses at regular intervals. Do not take your medicine more often than directed. Do not suddenly stop your medicine. You must gradually reduce the dose or you may feel withdrawal effects. Ask your doctor or health care professional for advice. A special MedGuide will be given to you by the pharmacist with each prescription and refill. Be sure to read this information carefully each time. Talk to your pediatrician regarding the use of this medicine in children. While this drug may be prescribed for children as young as 40 years of age for selected conditions, precautions do apply. Overdosage: If you think you have taken too much of this medicine contact a poison control center or emergency room at once. NOTE: This medicine is only for you. Do not share this medicine with others. What if I miss a dose? If you miss a dose, take it as soon as you can. If it is almost time for your next dose, take only that dose. Do not take double or extra doses. What may interact with this medicine? Do not take this medicine with any of the following medications: -certain medicines for migraine headache like almotriptan, eletriptan, frovatriptan, naratriptan, rizatriptan, sumatriptan, zolmitriptan -MAOIs like Carbex, Eldepryl, Marplan, Nardil, and Parnate -meperidine -other stimulant medicines for attention disorders, weight loss, or to stay awake -pimozide -procarbazine This medicine may also interact with the following medications: -acetazolamide -ammonium chloride -antacids -ascorbic acid -atomoxetine -caffeine -certain medicines for blood pressure -  certain medicines for depression, anxiety, or psychotic disturbances -certain medicines for seizures like carbamazepine, phenobarbital,  phenytoin -certain medicines for stomach problems like cimetidine, famotidine, omeprazole, lansoprazole -cold or allergy medicines -green tea -levodopa -linezolid -medicines for sleep during surgery -methenamine -norepinephrine -phenothiazines like chlorpromazine, mesoridazine, prochlorperazine, thioridazine -propoxyphene -sodium acid phosphate -sodium bicarbonate This list may not describe all possible interactions. Give your health care provider a list of all the medicines, herbs, non-prescription drugs, or dietary supplements you use. Also tell them if you smoke, drink alcohol, or use illegal drugs. Some items may interact with your medicine. What should I watch for while using this medicine? Visit your doctor for regular check ups. This prescription requires that you follow special procedures with your doctor and pharmacy. You will need to have a new written prescription from your doctor every time you need a refill. This medicine may affect your concentration, or hide signs of tiredness. Until you know how this medicine affects you, do not drive, ride a bicycle, use machinery, or do anything that needs mental alertness. Tell your doctor or health care professional if this medicine loses its effects, or if you feel you need to take more than the prescribed amount. Do not change your dose without talking to your doctor or health care professional. Decreased appetite is a common side effect when starting this medicine. Eating small, frequent meals or snacks can help. Talk to your doctor if you continue to have poor eating habits. Height and weight growth of a child taking this medicine will be monitored closely. Do not take this medicine close to bedtime. It may prevent you from sleeping. If you are going to need surgery, a MRI, CT scan, or other procedure, tell your doctor that you are taking this medicine. You may need to stop taking this medicine before the procedure. Tell your doctor or  healthcare professional right away if you notice unexplained wounds on your fingers and toes while taking this medicine. You should also tell your healthcare provider if you experience numbness or pain, changes in the skin color, or sensitivity to temperature in your fingers or toes. What side effects may I notice from receiving this medicine? Side effects that you should report to your doctor or health care professional as soon as possible: -allergic reactions like skin rash, itching or hives, swelling of the face, lips, or tongue -changes in vision -chest pain or chest tightness -confusion, trouble speaking or understanding -fast, irregular heartbeat -fingers or toes feel numb, cool, painful -hallucination, loss of contact with reality -high blood pressure -males: prolonged or painful erection -seizures -severe headaches -shortness of breath -suicidal thoughts or other mood changes -trouble walking, dizziness, loss of balance or coordination -uncontrollable head, mouth, neck, arm, or leg movements Side effects that usually do not require medical attention (report to your doctor or health care professional if they continue or are bothersome): -anxious -headache -loss of appetite -nausea, vomiting -trouble sleeping -weight loss This list may not describe all possible side effects. Call your doctor for medical advice about side effects. You may report side effects to FDA at 1-800-FDA-1088. Where should I keep my medicine? Keep out of the reach of children. This medicine can be abused. Keep your medicine in a safe place to protect it from theft. Do not share this medicine with anyone. Selling or giving away this medicine is dangerous and against the law. Store at room temperature between 15 and 30 degrees C (59 and 86 degrees F). Protect from light. Keep  container tightly closed. Throw away any unused medicine after the expiration date. NOTE: This sheet is a summary. It may not cover all  possible information. If you have questions about this medicine, talk to your doctor, pharmacist, or health care provider.  2015, [employer]. (2013-04-04 15:41:08)

## 2013-12-31 NOTE — Progress Notes (Signed)
Assessment and Plan:  Hypertension: Continue medication, monitor blood pressure at home. Continue DASH diet.  Reminder to go to the ER if any CP, SOB, nausea, dizziness, severe HA, changes vision/speech, left arm numbness and tingling, and jaw pain. Cholesterol: Continue diet and exercise. Check cholesterol.  ADD-  Continue ADD medication, helps with focus, no AE's. The patient was counseled on the addictive nature of the medication and was encouraged to take drug holidays when not needed. Discussed Vyvanse Vitamin D Def- check level and continue medications.   Continue diet and meds as discussed. Further disposition pending results of labs.  HPI 68 y.o. female  presents for 3 month follow up with hypertension, hyperlipidemia, prediabetes and vitamin D. Her blood pressure has been controlled at home, today their BP is BP: 138/82 mmHg She does not workout currently but wants to get back to it.. She denies chest pain, shortness of breath, dizziness.  She is not on cholesterol medication and denies myalgias. Her cholesterol is at goal. The cholesterol last visit was:   Lab Results  Component Value Date   CHOL 265* 06/27/2013   HDL 84 06/27/2013   LDLCALC 159* 06/27/2013   TRIG 110 06/27/2013   CHOLHDL 3.2 06/27/2013    Last A1C in the office was:  Lab Results  Component Value Date   HGBA1C 5.6 12/31/2012   Patient is on Vitamin D supplement.   Lab Results  Component Value Date   VD25OH 37 06/27/2013     Patient is on an ADD medication, she states that the medication is helping and she denies any adverse reactions.  She takes 1-1/2 pills per day. She states that she has consistently low energy, her adderall helps. She has been on the adderall for a long time and interested in other medications, we discussed Vyvanse, she will call her insurance. She does admit to some binge eating at night but states it is better with the adderall.   Current Medications:  Current Outpatient  Prescriptions on File Prior to Visit  Medication Sig Dispense Refill  . amphetamine-dextroamphetamine (ADDERALL) 30 MG tablet Take 1 tablet (30 mg total) by mouth 3 (three) times daily. 90 tablet 0  . Cholecalciferol (VITAMIN D3) 5000 UNITS TABS Take 10,000 Units by mouth.    . cycloSPORINE (RESTASIS) 0.05 % ophthalmic emulsion 1 drop 2 (two) times daily.    . famciclovir (FAMVIR) 500 MG tablet Take by mouth as needed.    . Omega-3 Fatty Acids (FISH OIL) 1000 MG CAPS Take by mouth daily.     No current facility-administered medications on file prior to visit.   Medical History:  Past Medical History  Diagnosis Date  . Dry eyes   . Attention deficit disorder (ADD)   . GERD (gastroesophageal reflux disease)   . Allergy   . Depression   . Anxiety   . Anemia   . Ocular rosacea   . HSV infection   . Osteopenia   . Lumbar degenerative disc disease   . Cancer     ? basal/squam cell on chest   Allergies:  Allergies  Allergen Reactions  . Statins     paralyzing  . Strattera [Atomoxetine Hcl]     Increased sadness     Review of Systems: [X]  = complains of  [ ]  = denies  General: Fatigue [ ]  Fever [ ]  Chills [ ]  Weakness [ ]   Insomnia [ ]  Eyes: Redness [ ]  Blurred vision [ ]  Diplopia [ ]   ENT: Congestion [ ]   Sinus Pain [ ]  Post Nasal Drip [ ]  Sore Throat [ ]  Earache [ ]   Cardiac: Chest pain/pressure [ ]  SOB [ ]  Orthopnea [ ]   Palpitations [ ]   Paroxysmal nocturnal dyspnea[ ]  Claudication [ ]  Edema [ ]   Pulmonary: Cough [ ]  Wheezing[ ]   SOB [ ]   Snoring [ ]   GI: Nausea [ ]  Vomiting[ ]  Dysphagia[ ]  Heartburn[ ]  Abdominal pain [ ]  Constipation [ ] ; Diarrhea [ ] ; BRBPR [ ]  Melena[ ]  GU: Hematuria[ ]  Dysuria [ ]  Nocturia[ ]  Urgency [ ]   Hesitancy [ ]  Discharge [ ]  Neuro: Headaches[ ]  Vertigo[ ]  Paresthesias[ ]  Spasm [ ]  Speech changes [ ]  Incoordination [ ]   Ortho: Arthritis bilateral feet pain, high arches [x ] Joint pain [ ]  Muscle pain [ ]  Joint swelling [ ]  Back Pain [ ]  Skin:   Rash [ ]   Pruritis [ ]  Change in skin lesion [ ]   Psych: Depression[ ]  Anxiety[ ]  Confusion [ ]  Memory loss [ ]   Heme/Lypmh: Bleeding [ ]  Bruising [ ]  Enlarged lymph nodes [ ]   Endocrine: Visual blurring [ ]  Paresthesia [ ]  Polyuria [ ]  Polydypsea [ ]    Heat/cold intolerance [ ]  Hypoglycemia [ ]   Family history- Review and unchanged Social history- Review and unchanged Physical Exam: BP 138/82 mmHg  Pulse 68  Temp(Src) 98.6 F (37 C)  Resp 16  Wt 190 lb (86.183 kg) Wt Readings from Last 3 Encounters:  12/31/13 190 lb (86.183 kg)  06/27/13 184 lb (83.462 kg)   General Appearance: Well nourished, in no apparent distress. Eyes: PERRLA, EOMs, conjunctiva no swelling or erythema Sinuses: No Frontal/maxillary tenderness ENT/Mouth: Ext aud canals clear, TMs without erythema, bulging. No erythema, swelling, or exudate on post pharynx.  Tonsils not swollen or erythematous. Hearing normal.  Neck: Supple, thyroid normal.  Respiratory: Respiratory effort normal, BS equal bilaterally without rales, rhonchi, wheezing or stridor.  Cardio: RRR with no MRGs, prominent S2. Brisk peripheral pulses without edema.  Abdomen: Soft, + BS.  Non tender, no guarding, rebound, hernias, masses. Lymphatics: Non tender without lymphadenopathy.  Musculoskeletal: Full ROM, 5/5 strength, normal gait.  Skin: Warm, dry without rashes, lesions, ecchymosis.  Neuro: Cranial nerves intact. Normal muscle tone, no cerebellar symptoms. Sensation intact.  Psych: Awake and oriented X 3, normal affect, Insight and Judgment appropriate.    Vicie Mutters, PA-C 9:31 AM [facility]

## 2014-01-01 ENCOUNTER — Encounter: Payer: Self-pay | Admitting: Emergency Medicine

## 2014-01-01 LAB — VITAMIN D 25 HYDROXY (VIT D DEFICIENCY, FRACTURES): Vit D, 25-Hydroxy: 57 ng/mL (ref 30–89)

## 2014-01-28 ENCOUNTER — Other Ambulatory Visit: Payer: Self-pay | Admitting: Physician Assistant

## 2014-01-28 MED ORDER — AMPHETAMINE-DEXTROAMPHETAMINE 30 MG PO TABS: 30.0000 mg | ORAL_TABLET | Freq: Three times a day (TID) | ORAL | Status: DC

## 2014-03-09 ENCOUNTER — Encounter: Payer: Self-pay | Admitting: *Deleted

## 2014-04-17 DIAGNOSIS — H04123 Dry eye syndrome of bilateral lacrimal glands: Secondary | ICD-10-CM | POA: Diagnosis not present

## 2014-04-17 DIAGNOSIS — H25813 Combined forms of age-related cataract, bilateral: Secondary | ICD-10-CM | POA: Diagnosis not present

## 2014-05-18 ENCOUNTER — Encounter: Payer: Self-pay | Admitting: *Deleted

## 2014-07-03 ENCOUNTER — Ambulatory Visit: Payer: Self-pay | Admitting: Sports Medicine

## 2014-07-04 ENCOUNTER — Encounter: Payer: Self-pay | Admitting: Sports Medicine

## 2014-07-04 ENCOUNTER — Ambulatory Visit (INDEPENDENT_AMBULATORY_CARE_PROVIDER_SITE_OTHER): Payer: Medicare Other | Admitting: Sports Medicine

## 2014-07-04 VITALS — BP 142/66 | HR 69 | Ht 62.0 in | Wt 190.0 lb

## 2014-07-04 DIAGNOSIS — M216X9 Other acquired deformities of unspecified foot: Secondary | ICD-10-CM

## 2014-07-04 DIAGNOSIS — M129 Arthropathy, unspecified: Secondary | ICD-10-CM

## 2014-07-04 DIAGNOSIS — Q667 Congenital pes cavus, unspecified foot: Secondary | ICD-10-CM | POA: Insufficient documentation

## 2014-07-04 DIAGNOSIS — M19079 Primary osteoarthritis, unspecified ankle and foot: Secondary | ICD-10-CM

## 2014-07-04 DIAGNOSIS — E669 Obesity, unspecified: Secondary | ICD-10-CM

## 2014-07-04 NOTE — Progress Notes (Signed)
Patient ID: Kathy Cook, female   DOB: 01/13/1946, 69 y.o.   MRN: [id number]  Pleasant patient with several year history of bilateral foot pain She was diagnosed with plantar fasciitis several years ago She had an orthopedic evaluation and went through corticosteroid injections Did not help Massage was helpful for her lower legs and feet Acupuncture was helpful with pain  Since that time she has had bilateral foot pain worse on the right She has pain in the mornings some days but not every day Pain in the right foot is actually worse over the dorsum of the arch Pain in the left foot stretches along the longitudinal arch Mild pain at the heel insertion on the right  She saw orthopedist -- some arthritic changes in her right midfoot  Chiropractor made her 2 pairs orthotics Walking shoe with orthotic was very uncomfortable 3/4 orthotic in her other shoes is more comfortable  Exam NAD/ Pleasant BP 142/66 mmHg  Pulse 69  Ht 5\' 2"  (1.575 m)  Wt 190 lb (86.183 kg)  BMI 34.74 kg/m2 seated bilateral feet are cavus Splaying between toes 2 and 3 on the left Hammertoes 4 and 5 right and left Morton's callus right and left  When standing she has loss of longitudinal arch Walking and standing -- pronation  Minimal tenderness to palpation along the plantar fascia slightly more right than left Good first MTP motion bilaterally  Ultrasound There is spurring and arthritic change at the TMT joints of the right foot Right plantar fascia shows a small calcaneal spur but no significant abnormality with thickness 0.45 Left foot shows TMT arthritic change only at the fourth Plantar fascia on the left is normal with a thickness of 0.46

## 2014-07-04 NOTE — Assessment & Plan Note (Signed)
She was given arch straps today  Because of the arthritis she needs a much more cushioned orthotic  She will return with the cushioned shoes and we will make her a custom pair

## 2014-07-04 NOTE — Assessment & Plan Note (Signed)
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

## 2014-07-11 ENCOUNTER — Other Ambulatory Visit: Payer: Self-pay

## 2014-07-11 MED ORDER — AMPHETAMINE-DEXTROAMPHETAMINE 30 MG PO TABS: 30.0000 mg | ORAL_TABLET | Freq: Three times a day (TID) | ORAL | Status: DC

## 2014-07-15 ENCOUNTER — Ambulatory Visit: Payer: Self-pay | Admitting: Internal Medicine

## 2014-07-19 ENCOUNTER — Other Ambulatory Visit: Payer: Self-pay | Admitting: Internal Medicine

## 2014-07-24 ENCOUNTER — Other Ambulatory Visit: Payer: Self-pay | Admitting: *Deleted

## 2014-07-24 ENCOUNTER — Other Ambulatory Visit: Payer: Self-pay | Admitting: Internal Medicine

## 2014-07-24 MED ORDER — AMPHETAMINE-DEXTROAMPHETAMINE 30 MG PO TABS: 30.0000 mg | ORAL_TABLET | Freq: Two times a day (BID) | ORAL | Status: DC

## 2014-08-01 ENCOUNTER — Encounter: Payer: Self-pay | Admitting: Internal Medicine

## 2014-08-01 ENCOUNTER — Ambulatory Visit (INDEPENDENT_AMBULATORY_CARE_PROVIDER_SITE_OTHER): Payer: Medicare Other | Admitting: Internal Medicine

## 2014-08-01 ENCOUNTER — Telehealth: Payer: Self-pay | Admitting: *Deleted

## 2014-08-01 VITALS — BP 122/86 | HR 60 | Temp 97.9°F | Resp 16 | Ht 62.25 in | Wt 191.4 lb

## 2014-08-01 DIAGNOSIS — E559 Vitamin D deficiency, unspecified: Secondary | ICD-10-CM | POA: Insufficient documentation

## 2014-08-01 DIAGNOSIS — E785 Hyperlipidemia, unspecified: Secondary | ICD-10-CM | POA: Diagnosis not present

## 2014-08-01 DIAGNOSIS — R7309 Other abnormal glucose: Secondary | ICD-10-CM | POA: Diagnosis not present

## 2014-08-01 DIAGNOSIS — Z79899 Other long term (current) drug therapy: Secondary | ICD-10-CM

## 2014-08-01 DIAGNOSIS — R030 Elevated blood-pressure reading, without diagnosis of hypertension: Secondary | ICD-10-CM | POA: Diagnosis not present

## 2014-08-01 DIAGNOSIS — F988 Other specified behavioral and emotional disorders with onset usually occurring in childhood and adolescence: Secondary | ICD-10-CM

## 2014-08-01 DIAGNOSIS — R0989 Other specified symptoms and signs involving the circulatory and respiratory systems: Secondary | ICD-10-CM | POA: Insufficient documentation

## 2014-08-01 DIAGNOSIS — IMO0001 Reserved for inherently not codable concepts without codable children: Secondary | ICD-10-CM

## 2014-08-01 LAB — CBC WITH DIFFERENTIAL/PLATELET
Basophils Absolute: 0.1 10*3/uL (ref 0.0–0.1)
Basophils Relative: 1 % (ref 0–1)
EOS PCT: 4 % (ref 0–5)
Eosinophils Absolute: 0.2 10*3/uL (ref 0.0–0.7)
HCT: 40.7 % (ref 36.0–46.0)
Hemoglobin: 13.4 g/dL (ref 12.0–15.0)
LYMPHS PCT: 24 % (ref 12–46)
Lymphs Abs: 1.3 10*3/uL (ref 0.7–4.0)
MCH: 31.4 pg (ref 26.0–34.0)
MCHC: 32.9 g/dL (ref 30.0–36.0)
MCV: 95.3 fL (ref 78.0–100.0)
MONO ABS: 0.4 10*3/uL (ref 0.1–1.0)
MPV: 10 fL (ref 8.6–12.4)
Monocytes Relative: 8 % (ref 3–12)
Neutro Abs: 3.5 10*3/uL (ref 1.7–7.7)
Neutrophils Relative %: 63 % (ref 43–77)
Platelets: 218 10*3/uL (ref 150–400)
RBC: 4.27 MIL/uL (ref 3.87–5.11)
RDW: 13.2 % (ref 11.5–15.5)
WBC: 5.6 10*3/uL (ref 4.0–10.5)

## 2014-08-01 LAB — LIPID PANEL
Cholesterol: 292 mg/dL — ABNORMAL HIGH (ref 0–200)
HDL: 94 mg/dL (ref >=46)
LDL Cholesterol: 164 mg/dL — ABNORMAL HIGH (ref 0–99)
Total CHOL/HDL Ratio: 3.1 Ratio
Triglycerides: 171 mg/dL — ABNORMAL HIGH (ref <150)
VLDL: 34 mg/dL (ref 0–40)

## 2014-08-01 LAB — BASIC METABOLIC PANEL WITH GFR
BUN: 13 mg/dL (ref 6–23)
CALCIUM: 9 mg/dL (ref 8.4–10.5)
CHLORIDE: 103 meq/L (ref 96–112)
CO2: 27 mEq/L (ref 19–32)
CREATININE: 0.7 mg/dL (ref 0.50–1.10)
GFR, Est African American: >89 mL/min
GLUCOSE: 109 mg/dL — AB (ref 70–99)
Potassium: 4.6 mEq/L (ref 3.5–5.3)
SODIUM: 143 meq/L (ref 135–145)

## 2014-08-01 LAB — HEPATIC FUNCTION PANEL
ALBUMIN: 4.1 g/dL (ref 3.5–5.2)
ALK PHOS: 59 U/L (ref 39–117)
ALT: 18 U/L (ref 0–35)
AST: 16 U/L (ref 0–37)
BILIRUBIN DIRECT: 0.1 mg/dL (ref 0.0–0.3)
BILIRUBIN INDIRECT: 0.7 mg/dL (ref 0.2–1.2)
TOTAL PROTEIN: 6.3 g/dL (ref 6.0–8.3)
Total Bilirubin: 0.8 mg/dL (ref 0.2–1.2)

## 2014-08-01 LAB — TSH: TSH: 2.854 u[IU]/mL (ref 0.350–4.500)

## 2014-08-01 LAB — MAGNESIUM: Magnesium: 2 mg/dL (ref 1.5–2.5)

## 2014-08-01 NOTE — Telephone Encounter (Signed)
,  patient filled her RX for 07/11/2014 for Adderall at a pharmacy in [place], [place], due to the fact that the local pharmacy did not have the manufacturer she prefers.  No RX needed at this time and her insurance covers only 2 tabs daily.

## 2014-08-01 NOTE — Patient Instructions (Addendum)
Recommend Adult Low dose Aspirin or baby Aspirin 81 mg daily   To reduce risk of Colon Cancer 20 %,   Skin Cancer 26 % ,   Melanoma 46%   and   Pancreatic cancer 60%  ++++++++++++++++++  Vitamin D goal is between 70-100.   Please make sure that you are taking your Vitamin D as directed.   It is very important as a natural anti-inflammatory   helping hair, skin, and nails, as well as reducing stroke and heart attack risk.   It helps your bones and helps with mood.  It also decreases numerous cancer risks so please take it as directed.   Low Vit D is associated with a 200-300% higher risk for CANCER   and 200-300% higher risk for HEART   ATTACK  &  STROKE.    .....................................Marland Kitchen  It is also associated with higher death rate at younger ages,   autoimmune diseases like Rheumatoid arthritis, Lupus, Multiple Sclerosis.     Also many other serious conditions, like depression, Alzheimer's  Dementia, infertility, muscle aches, fatigue, fibromyalgia - just to name a few.  +++++++++++++++++++    Recommend the book "The END of DIETING" by Dr Excell Seltzer   & the book "The END of DIABETES " by Dr Excell Seltzer  At [facility] [email or web address] - get book & Audio CD's     Being diabetic has a  300% increased risk for heart attack, stroke, cancer, and alzheimer- type vascular dementia. It is very important that you work harder with diet by avoiding all foods that are white. Avoid white rice (brown & wild rice is OK), white potatoes (sweetpotatoes in moderation is OK), White bread or wheat bread or anything made out of white flour like bagels, donuts, rolls, buns, biscuits, cakes, pastries, cookies, pizza crust, and pasta (made from white flour & egg whites) - vegetarian pasta or spinach or wheat pasta is OK. Multigrain breads like Arnold's or [employer], or multigrain sandwich thins or flatbreads.  Diet, exercise and weight loss can reverse and cure diabetes in the early  stages.  Diet, exercise and weight loss is very important in the control and prevention of complications of diabetes which affects every system in your body, ie. Brain - dementia/stroke, eyes - glaucoma/blindness, heart - heart attack/heart failure, kidneys - dialysis, stomach - gastric paralysis, intestines - malabsorption, nerves - severe painful neuritis, circulation - gangrene & loss of a leg(s), and finally cancer and Alzheimers.    I recommend avoid fried & greasy foods,  sweets/candy, white rice (brown or wild rice or Quinoa is OK), white potatoes (sweet potatoes are OK) - anything made from white flour - bagels, doughnuts, rolls, buns, biscuits,white and wheat breads, pizza crust and traditional pasta made of white flour & egg white(vegetarian pasta or spinach or wheat pasta is OK).  Multi-grain bread is OK - like multi-grain flat bread or sandwich thins. Avoid alcohol in excess. Exercise is also important.    Eat all the vegetables you want - avoid meat, especially red meat and dairy - especially cheese.  Cheese is the most concentrated form of trans-fats which is the worst thing to clog up our arteries. Veggie cheese is OK which can be found in the fresh produce section at [employer] or [employer] or [employer]  ++++++++++++++++++++++++++  Attention Deficit Hyperactivity Disorder Attention deficit hyperactivity disorder (ADHD) is a problem with behavior issues based on the way the brain functions (neurobehavioral disorder). It is a common reason for behavior and  academic problems in school. SYMPTOMS  There are 3 types of ADHD. The 3 types and some of the symptoms include:  Inattentive.  Gets bored or distracted easily.  Loses or forgets things. Forgets to hand in homework.  Has trouble organizing or completing tasks.  Difficulty staying on task.  An inability to organize daily tasks and school work.  Leaving projects, chores, or homework unfinished.  Trouble paying attention  or responding to details. Careless mistakes.  Difficulty following directions. Often seems like is not listening.  Dislikes activities that require sustained attention (like chores or homework).  Hyperactive-impulsive.  Feels like it is impossible to sit still or stay in a seat. Fidgeting with hands and feet.  Trouble waiting turn.  Talking too much or out of turn. Interruptive.  Speaks or acts impulsively.  Aggressive, disruptive behavior.  Constantly busy or on the go; noisy.  Often leaves seat when they are expected to remain seated.  Often runs or climbs where it is not appropriate, or feels very restless.  Combined.  Has symptoms of both of the above. Often children with ADHD feel discouraged about themselves and with school. They often perform well below their abilities in school. As children get older, the excess motor activities can calm down, but the problems with paying attention and staying organized persist. Most children do not outgrow ADHD but with good treatment can learn to cope with the symptoms. DIAGNOSIS  When ADHD is suspected, the diagnosis should be made by professionals trained in ADHD. This professional will collect information about the individual suspected of having ADHD. Information must be collected from various settings where the person lives, works, or attends school.  Diagnosis will include:  Confirming symptoms began in childhood.  Ruling out other reasons for the child's behavior.  The health care providers will check with the child's school and check their medical records.  They will talk to teachers and parents.  Behavior rating scales for the child will be filled out by those dealing with the child on a daily basis. A diagnosis is made only after all information has been considered. TREATMENT  Treatment usually includes behavioral treatment, tutoring or extra support in school, and stimulant medicines. Because of the way a person's  brain works with ADHD, these medicines decrease impulsivity and hyperactivity and increase attention. This is different than how they would work in a person who does not have ADHD. Other medicines used include antidepressants and certain blood pressure medicines. Most experts agree that treatment for ADHD should address all aspects of the person's functioning. Along with medicines, treatment should include structured classroom management at school. Parents should reward good behavior, provide constant discipline, and set limits. Tutoring should be available for the child as needed. ADHD is a lifelong condition. If untreated, the disorder can have long-term serious effects into adolescence and adulthood. HOME CARE INSTRUCTIONS   Often with ADHD there is a lot of frustration among family members dealing with the condition. Blame and anger are also feelings that are common. In many cases, because the problem affects the family as a whole, the entire family may need help. A therapist can help the family find better ways to handle the disruptive behaviors of the person with ADHD and promote change. If the person with ADHD is young, most of the therapist's work is with the parents. Parents will learn techniques for coping with and improving their child's behavior. Sometimes only the child with the ADHD needs counseling. Your health care providers can  help you make these decisions.  Children with ADHD may need help learning how to organize. Some helpful tips include:  Keep routines the same every day from wake-up time to bedtime. Schedule all activities, including homework and playtime. Keep the schedule in a place where the person with ADHD will often see it. Mark schedule changes as far in advance as possible.  Schedule outdoor and indoor recreation.  Have a place for everything and keep everything in its place. This includes clothing, backpacks, and school supplies.  Encourage writing down assignments and  bringing home needed books. Work with your child's teachers for assistance in organizing school work.  Offer your child a well-balanced diet. Breakfast that includes a balance of whole grains, protein, and fruits or vegetables is especially important for school performance. Children should avoid drinks with caffeine including:  Soft drinks.  Coffee.  Tea.  However, some older children (adolescents) may find these drinks helpful in improving their attention. Because it can also be common for adolescents with ADHD to become addicted to caffeine, talk with your health care provider about what is a safe amount of caffeine intake for your child.  Children with ADHD need consistent rules that they can understand and follow. If rules are followed, give small rewards. Children with ADHD often receive, and expect, criticism. Look for good behavior and praise it. Set realistic goals. Give clear instructions. Look for activities that can foster success and self-esteem. Make time for pleasant activities with your child. Give lots of affection.  Parents are their children's greatest advocates. Learn as much as possible about ADHD. This helps you become a stronger and better advocate for your child. It also helps you educate your child's teachers and instructors if they feel inadequate in these areas. Parent support groups are often helpful. A national group with local chapters is called Children and Adults with Attention Deficit Hyperactivity Disorder (CHADD). SEEK MEDICAL CARE IF:  Your child has repeated muscle twitches, cough, or speech outbursts.  Your child has sleep problems.  Your child has a marked loss of appetite.  Your child develops depression.  Your child has new or worsening behavioral problems.  Your child develops dizziness.  Your child has a racing heart.  Your child has stomach pains.  Your child develops headaches. SEEK IMMEDIATE MEDICAL CARE IF:  Your child has been  diagnosed with depression or anxiety and the symptoms seem to be getting worse.  Your child has been depressed and suddenly appears to have increased energy or motivation.  You are worried that your child is having a bad reaction to a medication he or she is taking for ADHD. Document Released: 02/04/2002 Document Revised: 02/19/2013 Document Reviewed: 10/22/2012 [facility] Patient Information 2015 [place], [place]. This information is not intended to replace advice given to you by your health care provider. Make sure you discuss any questions you have with your health care provider.

## 2014-08-01 NOTE — Progress Notes (Signed)
Patient ID: Kathy Cook, female   DOB: 1945/08/29, 69 y.o.   MRN: [id number]   This very nice 69 y.o. MWF presents for 3 month follow up with Hypertension, Hyperlipidemia, Pre-Diabetes and Vitamin D Deficiency.    Patient is monitored expectantly for labile or "white coat" HTN & BP has been controlled at home. Today's BP: 122/86 mmHg. Patient has had no complaints of any cardiac type chest pain, palpitations, dyspnea/orthopnea/PND, dizziness, claudication, or dependent edema.   Hyperlipidemia is controlled with diet & meds. Patient denies myalgias or other med SE's. Last Lipids were not at goal - with elevated Total Chol 258; HDL  89; elevated  LDL  143; Triglycerides 128 on 12/31/2013. Patient has hx/o statin intolerance with "severe" myalgias.    Also, the patient has history of Morbid Obesity (BMI 34.73) and is therefore screened for PreDiabetes and has had no symptoms of reactive hypoglycemia, diabetic polys, paresthesias or visual blurring.  Last A1c was 5.6% in Nov 2014.    Further, the patient also has history of Vitamin D Deficiency of 40 in 2010 and supplements vitamin D without any suspected side-effects. Last vitamin D was  57 on 12/31/2013.  Medication Sig  . ADDERALL 30 MG tablet Take 1 tablet by mouth 2 (two) times daily.  Marland Kitchen VITAMIN D 5000 UNITS  Take 10,000 Units by mouth.  . cycloSPORINE (RESTASIS) 0.05 % ophthalmic  1 drop 2 (two) times daily.  . famciclovir  500 MG tablet Take by mouth as needed.  Marland Kitchen FISH OIL  1000 MG CAPS Take by mouth daily.   Allergies  Allergen Reactions  . Statins     paralyzing  . Strattera [Atomoxetine Hcl]     Increased sadness   PMHx:   Past Medical History  Diagnosis Date  . Dry eyes   . Attention deficit disorder (ADD)   . GERD (gastroesophageal reflux disease)   . Allergy   . Depression   . Anxiety   . Anemia   . Ocular rosacea   . HSV infection   . Osteopenia   . Lumbar degenerative disc disease   . Cancer     ? basal/squam cell  on chest   Immunization History  Administered Date(s) Administered  . Pneumococcal-Unspecified 12/31/2012  . Td 03/29/2004   Past Surgical History  Procedure Laterality Date  . Tonsillectomy and adenoidectomy    . Skin biopsy      ?basal/squam cell on chest   FHx:    Reviewed / unchanged  SHx:    Reviewed / unchanged  Systems Review:  Constitutional: Denies fever, chills, wt changes, headaches, insomnia, fatigue, night sweats, change in appetite. Eyes: Denies redness, blurred vision, diplopia, discharge, itchy, watery eyes.  ENT: Denies discharge, congestion, post nasal drip, epistaxis, sore throat, earache, hearing loss, dental pain, tinnitus, vertigo, sinus pain, snoring.  CV: Denies chest pain, palpitations, irregular heartbeat, syncope, dyspnea, diaphoresis, orthopnea, PND, claudication or edema. Respiratory: denies cough, dyspnea, DOE, pleurisy, hoarseness, laryngitis, wheezing.  Gastrointestinal: Denies dysphagia, odynophagia, heartburn, reflux, water brash, abdominal pain or cramps, nausea, vomiting, bloating, diarrhea, constipation, hematemesis, melena, hematochezia  or hemorrhoids. Genitourinary: Denies dysuria, frequency, urgency, nocturia, hesitancy, discharge, hematuria or flank pain. Musculoskeletal: Denies arthralgias, myalgias, stiffness, jt. swelling, pain, limping or strain/sprain.  Skin: Denies pruritus, rash, hives, warts, acne, eczema or change in skin lesion(s). Neuro: No weakness, tremor, incoordination, spasms, paresthesia or pain. Psychiatric: Denies confusion, memory loss or sensory loss. Endo: Denies change in weight, skin or hair change.  Heme/Lymph:  No excessive bleeding, bruising or enlarged lymph nodes.  Physical Exam  BP 122/86   Pulse 60  Temp 97.9 F   Resp 16  Ht 5' 2.25"   Wt 191 lb 6.4 oz      BMI 34.73   Appears over nourished and in no distress. Eyes: PERRLA, EOMs, conjunctiva no swelling or erythema. Sinuses: No frontal/maxillary  tenderness ENT/Mouth: EAC's clear, TM's nl w/o erythema, bulging. Nares clear w/o erythema, swelling, exudates. Oropharynx clear without erythema or exudates. Oral hygiene is good. Tongue normal, non obstructing. Hearing intact.  Neck: Supple. Thyroid nl. Car 2+/2+ without bruits, nodes or JVD. Chest: Respirations nl with BS clear & equal w/o rales, rhonchi, wheezing or stridor.  Cor: Heart sounds normal w/ regular rate and rhythm without sig. murmurs, gallops, clicks, or rubs. Peripheral pulses normal and equal  without edema.  Abdomen: Soft & bowel sounds normal. Non-tender w/o guarding, rebound, hernias, masses, or organomegaly.  Lymphatics: Unremarkable.  Musculoskeletal: Full ROM all peripheral extremities, joint stability, 5/5 strength, and normal gait.  Skin: Warm, dry without exposed rashes, lesions or ecchymosis apparent.  Neuro: Cranial nerves intact, reflexes equal bilaterally. Sensory-motor testing grossly intact. Tendon reflexes grossly intact.  Pysch: Alert & oriented x 3.  Insight and judgement nl & appropriate. No ideations.  Assessment and Plan:  1. HTN, labile  - TSH  2. Hyperlipemia  - Lipid panel  3. Abnormal glucose  - Hemoglobin A1c - Insulin, random  4. Vitamin D deficiency  - Vit D  25 hydroxy   5. Attention deficit disorder (ADD)   6. Medication management  - CBC with Differential/Platelet - BASIC METABOLIC PANEL WITH GFR - Hepatic function panel - Magnesium   Recommended regular exercise, BP monitoring, weight control, and discussed med and SE's. Recommended labs to assess and monitor clinical status. Further disposition pending results of labs. Over 30 minutes of exam, counseling, chart review was performed ROV 3 mo for OV

## 2014-08-02 LAB — VITAMIN D 25 HYDROXY (VIT D DEFICIENCY, FRACTURES): Vit D, 25-Hydroxy: 33 ng/mL (ref 30–100)

## 2014-08-02 LAB — HEMOGLOBIN A1C
Hgb A1c MFr Bld: 5.7 % — ABNORMAL HIGH (ref <5.7)
Mean Plasma Glucose: 117 mg/dL — ABNORMAL HIGH (ref <117)

## 2014-08-02 LAB — INSULIN, RANDOM: INSULIN: 6.8 u[IU]/mL (ref 2.0–19.6)

## 2014-08-13 ENCOUNTER — Encounter: Payer: Self-pay | Admitting: Sports Medicine

## 2014-08-13 ENCOUNTER — Ambulatory Visit (INDEPENDENT_AMBULATORY_CARE_PROVIDER_SITE_OTHER): Payer: Medicare Other | Admitting: Sports Medicine

## 2014-08-13 VITALS — BP 160/95 | Ht 62.5 in | Wt 185.0 lb

## 2014-08-13 DIAGNOSIS — M129 Arthropathy, unspecified: Secondary | ICD-10-CM | POA: Diagnosis not present

## 2014-08-13 DIAGNOSIS — M19079 Primary osteoarthritis, unspecified ankle and foot: Secondary | ICD-10-CM

## 2014-08-13 DIAGNOSIS — M216X9 Other acquired deformities of unspecified foot: Secondary | ICD-10-CM | POA: Diagnosis not present

## 2014-08-13 NOTE — Progress Notes (Signed)
Patient ID: Kathy Cook, female   DOB: 1945/07/03, 69 y.o.   MRN: [id number]  Patient is returning for followup Chronic foot pain that limited her walking She was found to have some midfoot arthritis and cavus feet Arch straps and arch supports were used She has had dramatic improvement in pain She is now able to walk some for exercise  She comes today with multiple shoes to see if we can add arch support   Gen. No acute distress BP 160/95 mmHg  Ht 5' 2.5" (1.588 m)  Wt 185 lb (83.915 kg)  BMI 33.28 kg/m2  Cavus type foot Changes across the mid foot with some tarsal metatarsal spurring and bossing Slightly supinated gait No swelling No pain to palpation today

## 2014-08-13 NOTE — Assessment & Plan Note (Signed)
We modified her custom orthotics today to provide additional arch support  These felt good in her walking shoes  Walking gait was neutral  We added scaphoid pad to other pairs and that helped her feel more comfortable  She also has a modified three-quarter length orthotic that she is using in the other shoes  She will try this over the next 3 months and we will reevaluate

## 2014-08-13 NOTE — Assessment & Plan Note (Signed)
Arch strap sitting helpful so she will continue using these  When necessary Aleve for pain relief

## 2014-10-23 ENCOUNTER — Other Ambulatory Visit: Payer: Self-pay | Admitting: *Deleted

## 2014-10-23 MED ORDER — AMPHETAMINE-DEXTROAMPHETAMINE 30 MG PO TABS: 30.0000 mg | ORAL_TABLET | Freq: Two times a day (BID) | ORAL | Status: DC

## 2014-12-03 ENCOUNTER — Encounter: Payer: Self-pay | Admitting: Physician Assistant

## 2014-12-31 DIAGNOSIS — D225 Melanocytic nevi of trunk: Secondary | ICD-10-CM | POA: Diagnosis not present

## 2014-12-31 DIAGNOSIS — Z01419 Encounter for gynecological examination (general) (routine) without abnormal findings: Secondary | ICD-10-CM | POA: Diagnosis not present

## 2014-12-31 DIAGNOSIS — M8588 Other specified disorders of bone density and structure, other site: Secondary | ICD-10-CM | POA: Diagnosis not present

## 2014-12-31 DIAGNOSIS — Z6834 Body mass index (BMI) 34.0-34.9, adult: Secondary | ICD-10-CM | POA: Diagnosis not present

## 2014-12-31 DIAGNOSIS — D2261 Melanocytic nevi of right upper limb, including shoulder: Secondary | ICD-10-CM | POA: Diagnosis not present

## 2014-12-31 DIAGNOSIS — Z85828 Personal history of other malignant neoplasm of skin: Secondary | ICD-10-CM | POA: Diagnosis not present

## 2014-12-31 DIAGNOSIS — N958 Other specified menopausal and perimenopausal disorders: Secondary | ICD-10-CM | POA: Diagnosis not present

## 2014-12-31 DIAGNOSIS — Z1231 Encounter for screening mammogram for malignant neoplasm of breast: Secondary | ICD-10-CM | POA: Diagnosis not present

## 2014-12-31 DIAGNOSIS — I788 Other diseases of capillaries: Secondary | ICD-10-CM | POA: Diagnosis not present

## 2014-12-31 DIAGNOSIS — L821 Other seborrheic keratosis: Secondary | ICD-10-CM | POA: Diagnosis not present

## 2015-01-14 ENCOUNTER — Ambulatory Visit (INDEPENDENT_AMBULATORY_CARE_PROVIDER_SITE_OTHER): Payer: Medicare Other | Admitting: Physician Assistant

## 2015-01-14 ENCOUNTER — Encounter: Payer: Self-pay | Admitting: Physician Assistant

## 2015-01-14 VITALS — BP 124/66 | HR 68 | Temp 97.0°F | Resp 16 | Ht 62.5 in | Wt 189.0 lb

## 2015-01-14 DIAGNOSIS — L718 Other rosacea: Secondary | ICD-10-CM

## 2015-01-14 DIAGNOSIS — R7309 Other abnormal glucose: Secondary | ICD-10-CM | POA: Diagnosis not present

## 2015-01-14 DIAGNOSIS — Z79899 Other long term (current) drug therapy: Secondary | ICD-10-CM

## 2015-01-14 DIAGNOSIS — B009 Herpesviral infection, unspecified: Secondary | ICD-10-CM

## 2015-01-14 DIAGNOSIS — Z0001 Encounter for general adult medical examination with abnormal findings: Secondary | ICD-10-CM

## 2015-01-14 DIAGNOSIS — F325 Major depressive disorder, single episode, in full remission: Secondary | ICD-10-CM

## 2015-01-14 DIAGNOSIS — K219 Gastro-esophageal reflux disease without esophagitis: Secondary | ICD-10-CM

## 2015-01-14 DIAGNOSIS — Z23 Encounter for immunization: Secondary | ICD-10-CM | POA: Diagnosis not present

## 2015-01-14 DIAGNOSIS — M8580 Other specified disorders of bone density and structure, unspecified site: Secondary | ICD-10-CM

## 2015-01-14 DIAGNOSIS — D649 Anemia, unspecified: Secondary | ICD-10-CM | POA: Diagnosis not present

## 2015-01-14 DIAGNOSIS — F988 Other specified behavioral and emotional disorders with onset usually occurring in childhood and adolescence: Secondary | ICD-10-CM

## 2015-01-14 DIAGNOSIS — E559 Vitamin D deficiency, unspecified: Secondary | ICD-10-CM

## 2015-01-14 DIAGNOSIS — E669 Obesity, unspecified: Secondary | ICD-10-CM

## 2015-01-14 DIAGNOSIS — R6889 Other general symptoms and signs: Secondary | ICD-10-CM

## 2015-01-14 DIAGNOSIS — M5136 Other intervertebral disc degeneration, lumbar region: Secondary | ICD-10-CM

## 2015-01-14 DIAGNOSIS — Z1159 Encounter for screening for other viral diseases: Secondary | ICD-10-CM

## 2015-01-14 DIAGNOSIS — IMO0001 Reserved for inherently not codable concepts without codable children: Secondary | ICD-10-CM

## 2015-01-14 DIAGNOSIS — E785 Hyperlipidemia, unspecified: Secondary | ICD-10-CM

## 2015-01-14 DIAGNOSIS — T7840XD Allergy, unspecified, subsequent encounter: Secondary | ICD-10-CM

## 2015-01-14 DIAGNOSIS — Z0000 Encounter for general adult medical examination without abnormal findings: Secondary | ICD-10-CM

## 2015-01-14 DIAGNOSIS — R030 Elevated blood-pressure reading, without diagnosis of hypertension: Secondary | ICD-10-CM

## 2015-01-14 DIAGNOSIS — M51369 Other intervertebral disc degeneration, lumbar region without mention of lumbar back pain or lower extremity pain: Secondary | ICD-10-CM

## 2015-01-14 LAB — CBC WITH DIFFERENTIAL/PLATELET
BASOS ABS: 0.1 10*3/uL (ref 0.0–0.1)
Basophils Relative: 1 % (ref 0–1)
EOS ABS: 0.2 10*3/uL (ref 0.0–0.7)
EOS PCT: 4 % (ref 0–5)
HCT: 41.7 % (ref 36.0–46.0)
Hemoglobin: 13.9 g/dL (ref 12.0–15.0)
Lymphocytes Relative: 23 % (ref 12–46)
Lymphs Abs: 1.3 10*3/uL (ref 0.7–4.0)
MCH: 32.1 pg (ref 26.0–34.0)
MCHC: 33.3 g/dL (ref 30.0–36.0)
MCV: 96.3 fL (ref 78.0–100.0)
MPV: 10 fL (ref 8.6–12.4)
Monocytes Absolute: 0.6 10*3/uL (ref 0.1–1.0)
Monocytes Relative: 10 % (ref 3–12)
Neutro Abs: 3.5 10*3/uL (ref 1.7–7.7)
Neutrophils Relative %: 62 % (ref 43–77)
PLATELETS: 201 10*3/uL (ref 150–400)
RBC: 4.33 MIL/uL (ref 3.87–5.11)
RDW: 12.8 % (ref 11.5–15.5)
WBC: 5.7 10*3/uL (ref 4.0–10.5)

## 2015-01-14 LAB — HEMOGLOBIN A1C
HEMOGLOBIN A1C: 5.7 % — AB (ref <5.7)
MEAN PLASMA GLUCOSE: 117 mg/dL — AB (ref <117)

## 2015-01-14 MED ORDER — AMPHETAMINE-DEXTROAMPHETAMINE 30 MG PO TABS: 30.0000 mg | ORAL_TABLET | Freq: Two times a day (BID) | ORAL | Status: DC

## 2015-01-14 MED ORDER — FAMCICLOVIR 500 MG PO TABS: 500.0000 mg | ORAL_TABLET | Freq: Two times a day (BID) | ORAL | Status: DC

## 2015-01-14 NOTE — Patient Instructions (Signed)
We want weight loss that will last so you should lose 1-2 pounds a week.  THAT IS IT! Please pick THREE things a month to change. Once it is a habit check off the item. Then pick another three items off the list to become habits.  If you are already doing a habit on the list GREAT!  Cross that item off! o Don't drink your calories. Ie, alcohol, soda, fruit juice, and sweet tea.  o Drink more water. Drink a glass when you feel hungry or before each meal.  o Eat breakfast - Complex carb and protein (likeDannon light and fit yogurt, oatmeal, fruit, eggs, [place] bacon). o Measure your cereal.  Eat no more than one cup a day. (ie [place]) o Eat an apple a day. o Add a vegetable a day. o Try a new vegetable a month. o Use Pam! Stop using oil or butter to cook. o Don't finish your plate or use smaller plates. o Share your dessert. o Eat sugar free Jello for dessert or frozen grapes. o Don't eat 2-3 hours before bed. o Switch to whole wheat bread, pasta, and brown rice. o Make healthier choices when you eat out. No fries! o Pick baked chicken, NOT fried. o Don't forget to SLOW DOWN when you eat. It is not going anywhere.  o Take the stairs. o Park far away in the parking lot o [employer] (or weights) for 10 minutes while watching TV. o Walk at work for 10 minutes during break. o Walk outside 1 time a week with your friend, kids, dog, or significant other. o Start a walking group at [place] the mall as much as you can tolerate.  o Keep a food diary. o Weigh yourself daily. o Walk for 15 minutes 3 days per week. o Cook at home more often and eat out less.  If life happens and you go back to old habits, it is okay.  Just start over. You can do it!   If you experience chest pain, get short of breath, or tired during the exercise, please stop immediately and inform your doctor.      Bad carbs also include fruit juice, alcohol, and sweet tea. These are empty calories that do not signal  to your brain that you are full.   Please remember the good carbs are still carbs which convert into sugar. So please measure them out no more than 1/2-1 cup of rice, oatmeal, pasta, and beans  Veggies are however free foods! Pile them on.   Not all fruit is created equal. Please see the list below, the fruit at the bottom is higher in sugars than the fruit at the top. Please avoid all dried fruits.     Preventive Care for Adults A healthy lifestyle and preventive care can promote health and wellness. Preventive health guidelines for women include the following key practices.  A routine yearly physical is a good way to check with your health care provider about your health and preventive screening. It is a chance to share any concerns and updates on your health and to receive a thorough exam.  Visit your dentist for a routine exam and preventive care every 6 months. Brush your teeth twice a day and floss once a day. Good oral hygiene prevents tooth decay and gum disease.  The frequency of eye exams is based on your age, health, family medical history, use of contact lenses, and other factors. Follow your health care provider's  recommendations for frequency of eye exams.  Eat a healthy diet. Foods like vegetables, fruits, whole grains, low-fat dairy products, and lean protein foods contain the nutrients you need without too many calories. Decrease your intake of foods high in solid fats, added sugars, and salt. Eat the right amount of calories for you.Get information about a proper diet from your health care provider, if necessary.  Regular physical exercise is one of the most important things you can do for your health. Most adults should get at least 150 minutes of moderate-intensity exercise (any activity that increases your heart rate and causes you to sweat) each week. In addition, most adults need muscle-strengthening exercises on 2 or more days a week.  Maintain a healthy weight. The  body mass index (BMI) is a screening tool to identify possible weight problems. It provides an estimate of body fat based on height and weight. Your health care provider can find your BMI and can help you achieve or maintain a healthy weight.For adults 20 years and older:  A BMI below 18.5 is considered underweight.  A BMI of 18.5 to 24.9 is normal.  A BMI of 25 to 29.9 is considered overweight.  A BMI of 30 and above is considered obese.  Maintain normal blood lipids and cholesterol levels by exercising and minimizing your intake of saturated fat. Eat a balanced diet with plenty of fruit and vegetables. If your lipid or cholesterol levels are high, you are over 50, or you are at high risk for heart disease, you may need your cholesterol levels checked more frequently.Ongoing high lipid and cholesterol levels should be treated with medicines if diet and exercise are not working.  If you smoke, find out from your health care provider how to quit. If you do not use tobacco, do not start.  Lung cancer screening is recommended for adults aged 2-80 years who are at high risk for developing lung cancer because of a history of smoking. A yearly low-dose CT scan of the lungs is recommended for people who have at least a 30-pack-year history of smoking and are a current smoker or have quit within the past 15 years. A pack year of smoking is smoking an average of 1 pack of cigarettes a day for 1 year (for example: 1 pack a day for 30 years or 2 packs a day for 15 years). Yearly screening should continue until the smoker has stopped smoking for at least 15 years. Yearly screening should be stopped for people who develop a health problem that would prevent them from having lung cancer treatment.  Avoid use of street drugs. Do not share needles with anyone. Ask for help if you need support or instructions about stopping the use of drugs.  High blood pressure causes heart disease and increases the risk of  stroke.  Ongoing high blood pressure should be treated with medicines if weight loss and exercise do not work.  If you are 42-18 years old, ask your health care provider if you should take aspirin to prevent strokes.  Diabetes screening involves taking a blood sample to check your fasting blood sugar level. This should be done once every 3 years, after age 71, if you are within normal weight and without risk factors for diabetes. Testing should be considered at a younger age or be carried out more frequently if you are overweight and have at least 1 risk factor for diabetes.  Breast cancer screening is essential preventive care for women. You should practice "  breast self-awareness." This means understanding the normal appearance and feel of your breasts and may include breast self-examination. Any changes detected, no matter how small, should be reported to a health care provider. Women in their 7s and 30s should have a clinical breast exam (CBE) by a health care provider as part of a regular health exam every 1 to 3 years. After age 93, women should have a CBE every year. Starting at age 55, women should consider having a mammogram (breast X-ray test) every year. Women who have a family history of breast cancer should talk to their health care provider about genetic screening. Women at a high risk of breast cancer should talk to their health care providers about having an MRI and a mammogram every year.  Breast cancer gene (BRCA)-related cancer risk assessment is recommended for women who have family members with BRCA-related cancers. BRCA-related cancers include breast, ovarian, tubal, and peritoneal cancers. Having family members with these cancers may be associated with an increased risk for harmful changes (mutations) in the breast cancer genes BRCA1 and BRCA2. Results of the assessment will determine the need for genetic counseling and BRCA1 and BRCA2 testing.  Routine pelvic exams to screen for  cancer are no longer recommended for nonpregnant women who are considered low risk for cancer of the pelvic organs (ovaries, uterus, and vagina) and who do not have symptoms. Ask your health care provider if a screening pelvic exam is right for you.  If you have had past treatment for cervical cancer or a condition that could lead to cancer, you need Pap tests and screening for cancer for at least 20 years after your treatment. If Pap tests have been discontinued, your risk factors (such as having a new sexual partner) need to be reassessed to determine if screening should be resumed. Some women have medical problems that increase the chance of getting cervical cancer. In these cases, your health care provider may recommend more frequent screening and Pap tests.    Colorectal cancer can be detected and often prevented. Most routine colorectal cancer screening begins at the age of 32 years and continues through age 77 years. However, your health care provider may recommend screening at an earlier age if you have risk factors for colon cancer. On a yearly basis, your health care provider may provide home test kits to check for hidden blood in the stool. Use of a small camera at the end of a tube, to directly examine the colon (sigmoidoscopy or colonoscopy), can detect the earliest forms of colorectal cancer. Talk to your health care provider about this at age 52, when routine screening begins. Direct exam of the colon should be repeated every 5-10 years through age 59 years, unless early forms of pre-cancerous polyps or small growths are found.  Osteoporosis is a disease in which the bones lose minerals and strength with aging. This can result in serious bone fractures or breaks. The risk of osteoporosis can be identified using a bone density scan. Women ages 24 years and over and women at risk for fractures or osteoporosis should discuss screening with their health care providers. Ask your health care  provider whether you should take a calcium supplement or vitamin D to reduce the rate of osteoporosis.  Menopause can be associated with physical symptoms and risks. Hormone replacement therapy is available to decrease symptoms and risks. You should talk to your health care provider about whether hormone replacement therapy is right for you.  Use sunscreen. Apply sunscreen  liberally and repeatedly throughout the day. You should seek shade when your shadow is shorter than you. Protect yourself by wearing long sleeves, pants, a wide-brimmed hat, and sunglasses year round, whenever you are outdoors.  Once a month, do a whole body skin exam, using a mirror to look at the skin on your back. Tell your health care provider of new moles, moles that have irregular borders, moles that are larger than a pencil eraser, or moles that have changed in shape or color.  Stay current with required vaccines (immunizations).  Influenza vaccine. All adults should be immunized every year.  Tetanus, diphtheria, and acellular pertussis (Td, Tdap) vaccine. Pregnant women should receive 1 dose of Tdap vaccine during each pregnancy. The dose should be obtained regardless of the length of time since the last dose. Immunization is preferred during the 27th-36th week of gestation. An adult who has not previously received Tdap or who does not know her vaccine status should receive 1 dose of Tdap. This initial dose should be followed by tetanus and diphtheria toxoids (Td) booster doses every 10 years. Adults with an unknown or incomplete history of completing a 3-dose immunization series with Td-containing vaccines should begin or complete a primary immunization series including a Tdap dose. Adults should receive a Td booster every 10 years.    Zoster vaccine. One dose is recommended for adults aged 35 years or older unless certain conditions are present.    Pneumococcal 13-valent conjugate (PCV13) vaccine. When indicated, a  person who is uncertain of her immunization history and has no record of immunization should receive the PCV13 vaccine. An adult aged 29 years or older who has certain medical conditions and has not been previously immunized should receive 1 dose of PCV13 vaccine. This PCV13 should be followed with a dose of pneumococcal polysaccharide (PPSV23) vaccine. The PPSV23 vaccine dose should be obtained at least 8 weeks after the dose of PCV13 vaccine. An adult aged 37 years or older who has certain medical conditions and previously received 1 or more doses of PPSV23 vaccine should receive 1 dose of PCV13. The PCV13 vaccine dose should be obtained 1 or more years after the last PPSV23 vaccine dose.    Pneumococcal polysaccharide (PPSV23) vaccine. When PCV13 is also indicated, PCV13 should be obtained first. All adults aged 3 years and older should be immunized. An adult younger than age 48 years who has certain medical conditions should be immunized. Any person who resides in a nursing home or long-term care facility should be immunized. An adult smoker should be immunized. People with an immunocompromised condition and certain other conditions should receive both PCV13 and PPSV23 vaccines. People with human immunodeficiency virus (HIV) infection should be immunized as soon as possible after diagnosis. Immunization during chemotherapy or radiation therapy should be avoided. Routine use of PPSV23 vaccine is not recommended for American Indians, [place]atives, or people younger than 65 years unless there are medical conditions that require PPSV23 vaccine. When indicated, people who have unknown immunization and have no record of immunization should receive PPSV23 vaccine. One-time revaccination 5 years after the first dose of PPSV23 is recommended for people aged 19-64 years who have chronic kidney failure, nephrotic syndrome, asplenia, or immunocompromised conditions. People who received 1-2 doses of PPSV23 before age  34 years should receive another dose of PPSV23 vaccine at age 69 years or later if at least 5 years have passed since the previous dose. Doses of PPSV23 are not needed for people immunized with PPSV23  at or after age 47 years.   Preventive Services / Frequency  Ages 66 years and over  Blood pressure check.  Lipid and cholesterol check.  Lung cancer screening. / Every year if you are aged 30-80 years and have a 30-pack-year history of smoking and currently smoke or have quit within the past 15 years. Yearly screening is stopped once you have quit smoking for at least 15 years or develop a health problem that would prevent you from having lung cancer treatment.  Clinical breast exam.** / Every year after age 49 years.  BRCA-related cancer risk assessment.** / For women who have family members with a BRCA-related cancer (breast, ovarian, tubal, or peritoneal cancers).  Mammogram.** / Every year beginning at age 46 years and continuing for as long as you are in good health. Consult with your health care provider.  Pap test.** / Every 3 years starting at age 40 years through age 29 or 35 years with 3 consecutive normal Pap tests. Testing can be stopped between 65 and 70 years with 3 consecutive normal Pap tests and no abnormal Pap or HPV tests in the past 10 years.  Fecal occult blood test (FOBT) of stool. / Every year beginning at age 38 years and continuing until age 50 years. You may not need to do this test if you get a colonoscopy every 10 years.  Flexible sigmoidoscopy or colonoscopy.** / Every 5 years for a flexible sigmoidoscopy or every 10 years for a colonoscopy beginning at age 32 years and continuing until age 6 years.  Hepatitis C blood test.** / For all people born from [birth year] through 1963 and any individual with known risks for hepatitis C.  Osteoporosis screening.** / A one-time screening for women ages 72 years and over and women at risk for fractures or osteoporosis.  Skin  self-exam. / Monthly.  Influenza vaccine. / Every year.  Tetanus, diphtheria, and acellular pertussis (Tdap/Td) vaccine.** / 1 dose of Td every 10 years.  Zoster vaccine.** / 1 dose for adults aged 29 years or older.  Pneumococcal 13-valent conjugate (PCV13) vaccine.** / Consult your health care provider.  Pneumococcal polysaccharide (PPSV23) vaccine.** / 1 dose for all adults aged 63 years and older. Screening for abdominal aortic aneurysm (AAA)  by ultrasound is recommended for people who have history of high blood pressure or who are current or former smokers.

## 2015-01-14 NOTE — Progress Notes (Signed)
Medicare wellness and follow up visit.  Assessment:   1. HTN, labile - continue medications, DASH diet, exercise and monitor at home. Call if greater than 130/80.  - CBC with Differential/Platelet - BASIC METABOLIC PANEL WITH GFR - Hepatic function panel - TSH - Urinalysis, Routine w reflex microscopic (not at [facility]) - Microalbumin / creatinine urine ratio  2. Hyperlipemia -continue medications, check lipids, decrease fatty foods, increase activity.  - Lipid panel  3. Obesity Obesity with co morbidities- long discussion about weight loss, diet, and exercise  4. Attention deficit disorder (ADD) Continue adderall  5. Abnormal glucose - Insulin, fasting - Hemoglobin A1c  6. Anemia, unspecified anemia type - Iron and TIBC - Ferritin - Vitamin B12  7. Allergy, subsequent encounter Continue OTC allergy pills  8. Depression, major, in remission ([place]) remission  9. Lumbar degenerative disc disease RICE, NSAIDS, exercises given, if not better get xray and PT referral or ortho referral.   10. Ocular rosacea Follow up eye doctor  11. Osteopenia Get DEXA, continue vitamin D,Calcium  12. Gastroesophageal reflux disease, esophagitis presence not specified Continue PPI/H2 blocker, diet discussed  13. Vitamin D deficiency - VITAMIN D 25 Hydroxy (Vit-D Deficiency, Fractures)  14. Medication management - Magnesium  15. HSV infection Refill meds  16. Screening for viral disease - Hepatitis C antibody - Varicella zoster antibody, IgG  17. Need for prophylactic vaccination with combined diphtheria-tetanus-pertussis (DTP) vaccine - Dt vaccine greater than 7yo IM     Plan:   During the course of the visit the patient was educated and counseled about appropriate screening and preventive services including:    Pneumococcal vaccine   Influenza vaccine  Td vaccine  Screening electrocardiogram  Screening mammography  Bone densitometry screening  Colorectal  cancer screening  Diabetes screening  Glaucoma screening  Nutrition counseling   Advanced directives: given info  Conditions/risks identified: BMI: Discussed weight loss, diet, and increase physical activity.  Increase physical activity: AHA recommends 150 minutes of physical activity a week.  Medications reviewed DEXA- does at OB/GYN Urinary Incontinence is an issue, stress incontinence, declines UA/C&S and meds: discussed non pharmacology and pharmacology options.  Fall risk: low- discussed PT, home fall assessment, medications.   Subjective:   Kathy Cook is a 69 y.o. female who presents for Medicare Annual Wellness Visit and 3 month follow up on hypertension, hyperlipidemia, vitamin D def.  Date of last medicare wellness visit was 06/27/2013  Her blood pressure has been controlled at home, today their BP is BP: 124/66 mmHg She does workout, walks. She denies chest pain, shortness of breath, dizziness.  She is not on cholesterol medication, can not tolerate statins and never tried zetia.  Her cholesterol is not at goal. The cholesterol last visit was:   Lab Results  Component Value Date   CHOL 292* 08/01/2014   HDL 94 08/01/2014   LDLCALC 164* 08/01/2014   TRIG 171* 08/01/2014   CHOLHDL 3.1 08/01/2014   Last A1C in the office was:  Lab Results  Component Value Date   HGBA1C 5.7* 08/01/2014   Patient is on Vitamin D supplement. Lab Results  Component Value Date   VD25OH 33 08/01/2014   She does well on the Adderall, helps with concentration.  Has HSV 1 and has famcyclovir for occ out break.  Has some stress incontinence last 2-3 weeks with sneezing, does not want to check urine and does not want meds.  Has been seeing Dr. Oneida Alar for bilateral feet OA, feeling better,  has orthotics.  BMI is Body mass index is 34 kg/(m^2)., she is working on diet and exercise. Wt Readings from Last 3 Encounters:  01/14/15 189 lb (85.73 kg)  08/13/14 185 lb (83.915 kg)  08/01/14  191 lb 6.4 oz (86.818 kg)    Names of Other Physician/Practitioners you currently use: 1. [facility]- here for primary care 2. [place], eye doctor, 01/2014 3.  Dr. Johnnye Sima, dentist, last visit q 6 months, doing invisiline Patient Care Team: Unk Pinto, MD as PCP - General (Internal Medicine) Dian Queen, MD as Consulting Physician (Obstetrics and Gynecology) Clent Jacks, MD as Consulting Physician (Ophthalmology) Lindwood Coke, MD as Consulting Physician (Dermatology)  Medication Review Current Outpatient Prescriptions on File Prior to Visit  Medication Sig Dispense Refill  . amphetamine-dextroamphetamine (ADDERALL) 30 MG tablet Take 1 tablet by mouth 2 (two) times daily. 60 tablet 0  . Cholecalciferol (VITAMIN D3) 5000 UNITS TABS Take 10,000 Units by mouth.    . Estradiol-Estriol-Progesterone (BIEST/PROGESTERONE) CREA Place onto the skin. Marland Kitchen05 mg/ml cream to apply daily    . famciclovir (FAMVIR) 500 MG tablet Take by mouth as needed.    . Omega-3 Fatty Acids (FISH OIL) 1000 MG CAPS Take by mouth daily.    . Progesterone Micronized 10 % CREA Place onto the skin. Patient applies 2 % cream daily     No current facility-administered medications on file prior to visit.    Current Problems (verified) Patient Active Problem List   Diagnosis Date Noted  . HTN, labile 08/01/2014  . Abnormal glucose 08/01/2014  . Vitamin D deficiency 08/01/2014  . Medication management 08/01/2014  . Obesity 07/04/2014  . Cavus deformity of foot 07/04/2014  . Arthritis, midfoot 07/04/2014  . Hyperlipemia 06/27/2013  . Dry eyes   . Attention deficit disorder (ADD)   . GERD (gastroesophageal reflux disease)   . Allergy   . Depression   . Anxiety   . Anemia   . Ocular rosacea   . HSV infection   . Osteopenia   . Lumbar degenerative disc disease     Screening Tests Health Maintenance  Topic Date Due  . Hepatitis C Screening  05/02/1945  .  COLONOSCOPY  02/09/1998  . ZOSTAVAX  02/10/2008  . DEXA SCAN  02/09/2013  . PNA vac Low Risk Adult (1 of 2 - PCV13) 12/31/2013  . TETANUS/TDAP  03/29/2014  . INFLUENZA VACCINE  09/29/2014  . MAMMOGRAM  12/21/2015     Immunization History  Administered Date(s) Administered  . Pneumococcal-Unspecified 12/31/2012  . Td 03/29/2004    Preventative care: Last colonoscopy: 2006 due 2016 DUE Last mammogram: 11/2013 Last pap smear/pelvic exam: 11/2013 DEXA 07/2012 due this year  Prior vaccinations: TD or Tdap: 2006 DUE  Influenza: declines Pneumococcal: 2014 Prevnar 13: out in the office Shingles/Zostavax: declines, states has not had chicken pox  Allergies Allergies  Allergen Reactions  . Statins     paralyzing  . Strattera [Atomoxetine Hcl]     Increased sadness   Surgical history Past Surgical History  Procedure Laterality Date  . Tonsillectomy and adenoidectomy    . Skin biopsy      ?basal/squam cell on chest   Family history Family History  Problem Relation Age of Onset  . Hypertension Mother   . Depression Mother   . Macular degeneration Mother   . Stroke Father   . Diabetes Father    Tobacco Social History  Substance Use Topics  . Smoking status: Former Smoker --  0.50 packs/day for 15 years    Types: Cigarettes  . Smokeless tobacco: Never Used  . Alcohol Use: Yes     Comment: social   MEDICARE WELLNESS OBJECTIVES: Tobacco use: She does not smoke.  Patient is a former smoker. If yes, counseling given Alcohol Current alcohol use: none Osteoporosis: postmenopausal estrogen deficiency and dietary calcium and/or vitamin D deficiency, History of fracture in the past year: no Fall risk: Low Risk Diet: in general, a "healthy" diet   Physical activity: Current Exercise Habits:: Home exercise routine, Type of exercise: walking, Time (Minutes): 20, Frequency (Times/Week): 3, Weekly Exercise (Minutes/Week): 60, Intensity: Moderate Cardiac risk factors: Cardiac  Risk Factors include: advanced age (>13men, >28 women);dyslipidemia;hypertension;obesity (BMI >30kg/m2);sedentary lifestyle Depression/mood screen:   Depression screen [facility] 2/9 01/14/2015  Decreased Interest 0  Down, Depressed, Hopeless 0  PHQ - 2 Score 0    ADLs:  In your present state of health, do you have any difficulty performing the following activities: 01/14/2015  Hearing? N  Vision? Y  Difficulty concentrating or making decisions? N  Walking or climbing stairs? N  Dressing or bathing? N  Doing errands, shopping? N  Preparing Food and eating ? N  Using the Toilet? N  In the past six months, have you accidently leaked urine? N  Do you have problems with loss of bowel control? N  Managing your Medications? N  Managing your Finances? N  Housekeeping or managing your Housekeeping? N     Cognitive Testing  Alert? Yes  Normal Appearance?Yes  Oriented to person? Yes  Place? Yes   Time? Yes  Recall of three objects?  Yes  Can perform simple calculations? Yes  Displays appropriate judgment?Yes  Can read the correct time from a watch face?Yes  EOL planning: Does patient have an advance directive?: Yes Type of Advance Directive: [place], Living will Does patient want to make changes to advanced directive?: No - Patient declined Copy of advanced directive(s) in chart?: No - copy requested   Objective:   Blood pressure 124/66, pulse 68, temperature 97 F (36.1 C), temperature source Temporal, resp. rate 16, height 5' 2.5" (1.588 m), weight 189 lb (85.73 kg), SpO2 98 %. Body mass index is 34 kg/(m^2).  General appearance: alert, no distress, WD/WN,  female HEENT: normocephalic, sclerae anicteric, TMs pearly, nares patent, no discharge or erythema, pharynx normal Oral cavity: MMM, no lesions Neck: supple, no lymphadenopathy, no thyromegaly, no masses Heart: RRR, normal S1, S2, no murmurs Lungs: CTA bilaterally, no wheezes, rhonchi, or rales Abdomen:  +bs, soft, non tender, non distended, no masses, no hepatomegaly, no splenomegaly Musculoskeletal: nontender, no swelling, no obvious deformity Extremities: no edema, no cyanosis, no clubbing Pulses: 2+ symmetric, upper and lower extremities, normal cap refill Neurological: alert, oriented x 3, CN2-12 intact, strength normal upper extremities and lower extremities, sensation normal throughout, DTRs 2+ throughout, no cerebellar signs, gait normal Psychiatric: normal affect, behavior normal, pleasant  Breast: defer Gyn: defer Rectal: defer   Medicare Attestation I have personally reviewed: The patient's medical and social history Their use of alcohol, tobacco or illicit drugs Their current medications and supplements The patient's functional ability including ADLs,fall risks, home safety risks, cognitive, and hearing and visual impairment Diet and physical activities Evidence for depression or mood disorders  The patient's weight, height, BMI, and visual acuity have been recorded in the chart.  I have made referrals, counseling, and provided education to the patient based on review of the above and I have  provided the patient with a written personalized care plan for preventive services.     Vicie Mutters, PA-C   01/14/2015

## 2015-01-15 LAB — BASIC METABOLIC PANEL WITH GFR
BUN: 19 mg/dL (ref 7–25)
CALCIUM: 9 mg/dL (ref 8.6–10.4)
CO2: 27 mmol/L (ref 20–31)
CREATININE: 0.79 mg/dL (ref 0.50–0.99)
Chloride: 104 mmol/L (ref 98–110)
GFR, Est African American: >89 mL/min (ref >=60)
GFR, Est Non African American: 78 mL/min (ref >=60)
GLUCOSE: 86 mg/dL (ref 65–99)
POTASSIUM: 4.6 mmol/L (ref 3.5–5.3)
SODIUM: 141 mmol/L (ref 135–146)

## 2015-01-15 LAB — URINALYSIS, MICROSCOPIC ONLY
BACTERIA UA: NONE SEEN [HPF]
Casts: NONE SEEN [LPF]
RBC / HPF: NONE SEEN RBC/HPF (ref <=2)
WBC UA: NONE SEEN WBC/HPF (ref <=5)
YEAST: NONE SEEN [HPF]

## 2015-01-15 LAB — LIPID PANEL
Cholesterol: 284 mg/dL — ABNORMAL HIGH (ref 125–200)
HDL: 87 mg/dL (ref >=46)
LDL Cholesterol: 167 mg/dL — ABNORMAL HIGH (ref <130)
Total CHOL/HDL Ratio: 3.3 Ratio (ref <=5.0)
Triglycerides: 152 mg/dL — ABNORMAL HIGH (ref <150)
VLDL: 30 mg/dL (ref <30)

## 2015-01-15 LAB — VITAMIN B12: Vitamin B-12: 231 pg/mL (ref 211–911)

## 2015-01-15 LAB — MICROALBUMIN / CREATININE URINE RATIO
Creatinine, Urine: 223 mg/dL (ref 20–320)
Microalb Creat Ratio: 3 mcg/mg creat (ref <30)
Microalb, Ur: 0.7 mg/dL

## 2015-01-15 LAB — URINALYSIS, ROUTINE W REFLEX MICROSCOPIC
BILIRUBIN URINE: NEGATIVE
GLUCOSE, UA: NEGATIVE
Hgb urine dipstick: NEGATIVE
KETONES UR: NEGATIVE
Leukocytes, UA: NEGATIVE
Nitrite: NEGATIVE
PH: 5.5 (ref 5.0–8.0)
Protein, ur: NEGATIVE
SPECIFIC GRAVITY, URINE: 1.029 (ref 1.001–1.035)

## 2015-01-15 LAB — IRON AND TIBC
%SAT: 32 % (ref 11–50)
Iron: 105 ug/dL (ref 45–160)
TIBC: 329 ug/dL (ref 250–450)
UIBC: 224 ug/dL (ref 125–400)

## 2015-01-15 LAB — HEPATIC FUNCTION PANEL
ALT: 13 U/L (ref 6–29)
AST: 13 U/L (ref 10–35)
Albumin: 4.2 g/dL (ref 3.6–5.1)
Alkaline Phosphatase: 67 U/L (ref 33–130)
Bilirubin, Direct: 0.1 mg/dL (ref <=0.2)
Indirect Bilirubin: 0.5 mg/dL (ref 0.2–1.2)
Total Bilirubin: 0.6 mg/dL (ref 0.2–1.2)
Total Protein: 6.5 g/dL (ref 6.1–8.1)

## 2015-01-15 LAB — HEPATITIS C ANTIBODY: HCV Ab: NEGATIVE

## 2015-01-15 LAB — TSH: TSH: 2.358 u[IU]/mL (ref 0.350–4.500)

## 2015-01-15 LAB — MAGNESIUM: Magnesium: 2.1 mg/dL (ref 1.5–2.5)

## 2015-01-15 LAB — VARICELLA ZOSTER ANTIBODY, IGG: Varicella IgG: 1126 Index — ABNORMAL HIGH (ref <135.00)

## 2015-01-15 LAB — INSULIN, FASTING: INSULIN FASTING, SERUM: 3.9 u[IU]/mL (ref 2.0–19.6)

## 2015-01-15 LAB — FERRITIN: Ferritin: 122 ng/mL (ref 10–291)

## 2015-01-15 LAB — VITAMIN D 25 HYDROXY (VIT D DEFICIENCY, FRACTURES): Vit D, 25-Hydroxy: 32 ng/mL (ref 30–100)

## 2015-01-30 ENCOUNTER — Ambulatory Visit (INDEPENDENT_AMBULATORY_CARE_PROVIDER_SITE_OTHER): Payer: Medicare Other | Admitting: Internal Medicine

## 2015-01-30 VITALS — BP 118/74 | HR 76 | Temp 97.9°F | Resp 16 | Ht 62.5 in

## 2015-01-30 DIAGNOSIS — J069 Acute upper respiratory infection, unspecified: Secondary | ICD-10-CM

## 2015-01-30 MED ORDER — FLUTICASONE PROPIONATE 50 MCG/ACT NA SUSP: 2.0000 | Freq: Every day | NASAL | Status: DC

## 2015-01-30 MED ORDER — AZITHROMYCIN 250 MG PO TABS: ORAL_TABLET | ORAL | Status: DC

## 2015-01-30 MED ORDER — PREDNISONE 20 MG PO TABS: ORAL_TABLET | ORAL | Status: DC

## 2015-01-30 NOTE — Progress Notes (Signed)
Patient ID: Kathy Cook, female   DOB: Nov 01, 1945, 69 y.o.   MRN: [id number]  HPI  Patient presents to the office for evaluation of cough.  It has been going on for 1 weeks.  Patient reports all the time, dry minimal cough.  They also endorse change in voice, chills, postnasal drip, shortness of breath and sore throat, right ear pain, nasal congestion..  They have tried none.  They report that nothing has worked.  They denies other sick contacts.  She reports that her grandchildren were there at Thanksgiving.    She reports that she has seasonal allergies.    Review of Systems  Constitutional: Positive for chills. Negative for fever and malaise/fatigue.  HENT: Positive for congestion, ear pain and sore throat.   Eyes: Negative.   Respiratory: Positive for cough. Negative for sputum production, shortness of breath and wheezing.   Cardiovascular: Negative for chest pain, palpitations and leg swelling.  Skin: Negative.   Neurological: Positive for headaches.    PE:  Filed Vitals:   01/30/15 1009  BP: 118/74  Pulse: 76  Temp: 97.9 F (36.6 C)  Resp: 16   General:  Alert and non-toxic, WDWN, NAD HEENT: NCAT, PERLA, EOM normal, no occular discharge or erythema.  Nasal mucosal edema with sinus tenderness to palpation.  Oropharynx clear with minimal oropharyngeal edema and erythema.  Mucous membranes moist and pink. Neck:  Cervical adenopathy Chest:  RRR no MRGs.  Lungs clear to auscultation A&P with no wheezes rhonchi or rales.   Abdomen: +BS x 4 quadrants, soft, non-tender, no guarding, rigidity, or rebound. Skin: warm and dry no rash Neuro: A&Ox4, CN II-XII grossly intact  Assessment and Plan:  -zpak -prednisone -nasal saline -flonase -antihistamine -ibuprofen prn

## 2015-01-30 NOTE — Patient Instructions (Signed)
Please take the prednisone until it is all the way gone.  Please take the zpak until it is all the way gone.  Please use nasal saline as often as you can tolerate it.  Please buy either flonase 2 sprays per each nostril right before bedtime.  Spray either outside towards your ears or to the crown of your head.  Please take claritin, zyrtec, or allegra daily until the congestion resolves.  Store brands are okay.  Buy whatever is on sale.  If no relief in 10 days call the office.

## 2015-03-24 ENCOUNTER — Encounter: Payer: Self-pay | Admitting: Internal Medicine

## 2015-03-24 ENCOUNTER — Ambulatory Visit (INDEPENDENT_AMBULATORY_CARE_PROVIDER_SITE_OTHER): Payer: Medicare Other | Admitting: Physician Assistant

## 2015-03-24 VITALS — BP 138/90 | HR 82 | Temp 98.2°F | Resp 18 | Ht 62.5 in | Wt 193.0 lb

## 2015-03-24 DIAGNOSIS — F909 Attention-deficit hyperactivity disorder, unspecified type: Secondary | ICD-10-CM

## 2015-03-24 DIAGNOSIS — T7840XD Allergy, unspecified, subsequent encounter: Secondary | ICD-10-CM

## 2015-03-24 DIAGNOSIS — M5136 Other intervertebral disc degeneration, lumbar region: Secondary | ICD-10-CM

## 2015-03-24 DIAGNOSIS — R7309 Other abnormal glucose: Secondary | ICD-10-CM | POA: Diagnosis not present

## 2015-03-24 DIAGNOSIS — K219 Gastro-esophageal reflux disease without esophagitis: Secondary | ICD-10-CM

## 2015-03-24 DIAGNOSIS — M51369 Other intervertebral disc degeneration, lumbar region without mention of lumbar back pain or lower extremity pain: Secondary | ICD-10-CM

## 2015-03-24 DIAGNOSIS — E559 Vitamin D deficiency, unspecified: Secondary | ICD-10-CM | POA: Diagnosis not present

## 2015-03-24 DIAGNOSIS — E785 Hyperlipidemia, unspecified: Secondary | ICD-10-CM | POA: Diagnosis not present

## 2015-03-24 DIAGNOSIS — F325 Major depressive disorder, single episode, in full remission: Secondary | ICD-10-CM

## 2015-03-24 DIAGNOSIS — Z79899 Other long term (current) drug therapy: Secondary | ICD-10-CM

## 2015-03-24 DIAGNOSIS — Z23 Encounter for immunization: Secondary | ICD-10-CM

## 2015-03-24 DIAGNOSIS — F988 Other specified behavioral and emotional disorders with onset usually occurring in childhood and adolescence: Secondary | ICD-10-CM

## 2015-03-24 DIAGNOSIS — R030 Elevated blood-pressure reading, without diagnosis of hypertension: Secondary | ICD-10-CM | POA: Diagnosis not present

## 2015-03-24 DIAGNOSIS — E669 Obesity, unspecified: Secondary | ICD-10-CM | POA: Diagnosis not present

## 2015-03-24 DIAGNOSIS — B009 Herpesviral infection, unspecified: Secondary | ICD-10-CM | POA: Diagnosis not present

## 2015-03-24 DIAGNOSIS — Z0001 Encounter for general adult medical examination with abnormal findings: Secondary | ICD-10-CM

## 2015-03-24 DIAGNOSIS — R6889 Other general symptoms and signs: Secondary | ICD-10-CM | POA: Diagnosis not present

## 2015-03-24 DIAGNOSIS — L718 Other rosacea: Secondary | ICD-10-CM | POA: Diagnosis not present

## 2015-03-24 DIAGNOSIS — IMO0001 Reserved for inherently not codable concepts without codable children: Secondary | ICD-10-CM

## 2015-03-24 DIAGNOSIS — M8580 Other specified disorders of bone density and structure, unspecified site: Secondary | ICD-10-CM

## 2015-03-24 DIAGNOSIS — B029 Zoster without complications: Secondary | ICD-10-CM

## 2015-03-24 DIAGNOSIS — D649 Anemia, unspecified: Secondary | ICD-10-CM

## 2015-03-24 DIAGNOSIS — Z0000 Encounter for general adult medical examination without abnormal findings: Secondary | ICD-10-CM

## 2015-03-24 MED ORDER — TRIAMCINOLONE ACETONIDE 0.5 % EX CREA: 1.0000 "application " | TOPICAL_CREAM | Freq: Two times a day (BID) | CUTANEOUS | Status: DC

## 2015-03-24 NOTE — Patient Instructions (Addendum)
Do the famcyclovir 500mg  3 x a day for 7 days Will give triamcinolone  Shingles Shingles, which is also known as herpes zoster, is an infection that causes a painful skin rash and fluid-filled blisters. Shingles is not related to genital herpes, which is a sexually transmitted infection.   Shingles only develops in people who:  Have had chickenpox.  Have received the chickenpox vaccine. (This is rare.) CAUSES Shingles is caused by varicella-zoster virus (VZV). This is the same virus that causes chickenpox. After exposure to VZV, the virus stays in the body in an inactive (dormant) state. Shingles develops if the virus reactivates. This can happen many years after the initial exposure to VZV. It is not known what causes this virus to reactivate. RISK FACTORS People who have had chickenpox or received the chickenpox vaccine are at risk for shingles. Infection is more common in people who:  Are older than age 52.  Have a weakened defense (immune) system, such as those with HIV, AIDS, or cancer.  Are taking medicines that weaken the immune system, such as transplant medicines.  Are under great stress. SYMPTOMS Early symptoms of this condition include itching, tingling, and pain in an area on your skin. Pain may be described as burning, stabbing, or throbbing. A few days or weeks after symptoms start, a painful red rash appears, usually on one side of the body in a bandlike or beltlike pattern. The rash eventually turns into fluid-filled blisters that break open, scab over, and dry up in about 2-3 weeks. At any time during the infection, you may also develop:  A fever.  Chills.  A headache.  An upset stomach. DIAGNOSIS This condition is diagnosed with a skin exam. Sometimes, skin or fluid samples are taken from the blisters before a diagnosis is made. These samples are examined under a microscope or sent to a lab for testing. TREATMENT There is no specific cure for this condition.  Your health care provider will probably prescribe medicines to help you manage pain, recover more quickly, and avoid long-term problems. Medicines may include:  Antiviral drugs.  Anti-inflammatory drugs.  Pain medicines. If the area involved is on your face, you may be referred to a specialist, such as an eye doctor (ophthalmologist) or an ear, nose, and throat (ENT) doctor to help you avoid eye problems, chronic pain, or disability. HOME CARE INSTRUCTIONS Medicines  Take medicines only as directed by your health care provider.  Apply an anti-itch or numbing cream to the affected area as directed by your health care provider. Blister and Rash Care  Take a cool bath or apply cool compresses to the area of the rash or blisters as directed by your health care provider. This may help with pain and itching.  Keep your rash covered with a loose bandage (dressing). Wear loose-fitting clothing to help ease the pain of material rubbing against the rash.  Keep your rash and blisters clean with mild soap and cool water or as directed by your health care provider.  Check your rash every day for signs of infection. These include redness, swelling, and pain that lasts or increases.  Do not pick your blisters.  Do not scratch your rash. General Instructions  Rest as directed by your health care provider.  Keep all follow-up visits as directed by your health care provider. This is important.  Until your blisters scab over, your infection can cause chickenpox in people who have never had it or been vaccinated against it. To prevent this  from happening, avoid contact with other people, especially:  Babies.  Pregnant women.  Children who have eczema.  Elderly people who have transplants.  People who have chronic illnesses, such as leukemia or AIDS. SEEK MEDICAL CARE IF:  Your pain is not relieved with prescribed medicines.  Your pain does not get better after the rash heals.  Your rash  looks infected. Signs of infection include redness, swelling, and pain that lasts or increases. SEEK IMMEDIATE MEDICAL CARE IF:  The rash is on your face or nose.  You have facial pain, pain around your eye area, or loss of feeling on one side of your face.  You have ear pain or you have ringing in your ear.  You have loss of taste.  Your condition gets worse.   This information is not intended to replace advice given to you by your health care provider. Make sure you discuss any questions you have with your health care provider.   Document Released: 02/14/2005 Document Revised: 03/07/2014 Document Reviewed: 12/26/2013 Elsevier Interactive Patient Education [employer].

## 2015-03-24 NOTE — Progress Notes (Signed)
Medicare wellness and follow up visit.  Assessment:   1. HTN, labile - continue medications, DASH diet, exercise and monitor at home. Call if greater than 130/80.   2. Hyperlipemia -continue medications, check lipids, decrease fatty foods, increase activity.   3. Obesity Obesity with co morbidities- long discussion about weight loss, diet, and exercise  4. Attention deficit disorder (ADD) Continue adderall  5. Abnormal glucose Discussed general issues about diabetes pathophysiology and management., Educational material distributed., Suggested low cholesterol diet., Encouraged aerobic exercise., Discussed foot care., Reminded to get yearly retinal exam.  6. Anemia, unspecified anemia type - monitor, continue iron supp with Vitamin C and increase green leafy veggies  7. Allergy, subsequent encounter Continue OTC allergy pills  8. Depression, major, in remission ([place]) remission  9. Lumbar degenerative disc disease RICE, NSAIDS, exercises given, if not better get xray and PT referral or ortho referral.   10. Ocular rosacea Follow up eye doctor  11. Osteopenia Get DEXA, continue vitamin D,Calcium  12. Gastroesophageal reflux disease, esophagitis presence not specified Continue PPI/H2 blocker, diet discussed  13. Vitamin D deficiency - VITAMIN D 25 Hydroxy (Vit-D Deficiency, Fractures)  14. Medication management - Magnesium  15. HSV infection Refill meds  16. ? Shingles versus dermatitis famcyclovir 500mg  TID for 7 days Triamcinolone topical If need pain meds will call us/message [place]  17. Need prevnar 13 Get today  18. Medicare wellness Will schedule MGM and colonoscopy    Plan:   During the course of the visit the patient was educated and counseled about appropriate screening and preventive services including:    Pneumococcal vaccine   Influenza vaccine  Td vaccine  Screening electrocardiogram  Screening mammography  Bone densitometry  screening  Colorectal cancer screening  Diabetes screening  Glaucoma screening  Nutrition counseling   Advanced directives: given info  Conditions/risks identified: BMI: Discussed weight loss, diet, and increase physical activity.  Increase physical activity: AHA recommends 150 minutes of physical activity a week.  Medications reviewed DEXA- does at OB/GYN Urinary Incontinence is an issue, stress incontinence, declines UA/C&S and meds: discussed non pharmacology and pharmacology options.  Fall risk: low- discussed PT, home fall assessment, medications.   Subjective:   Kathy Cook is a 70 y.o. female who presents for Medicare Annual Wellness Visit and 3 month follow up on hypertension, hyperlipidemia, vitamin D def.  Date of last medicare wellness visit was 12/2014  Her blood pressure has been controlled at home, today their BP is BP: 138/90 mmHg She does workout, walks. She denies chest pain, shortness of breath, dizziness.  She is not on cholesterol medication, can not tolerate statins and never tried zetia.  Her cholesterol is not at goal. The cholesterol last visit was:   Lab Results  Component Value Date   CHOL 284* 01/14/2015   HDL 87 01/14/2015   LDLCALC 167* 01/14/2015   TRIG 152* 01/14/2015   CHOLHDL 3.3 01/14/2015   Last A1C in the office was:  Lab Results  Component Value Date   HGBA1C 5.7* 01/14/2015   Patient is on Vitamin D supplement. Lab Results  Component Value Date   VD25OH 32 01/14/2015   She does well on the Adderall, helps with concentration.  Has HSV 1 and has famcyclovir for occ out break. Did have + flank and had her friend look at it that is a Marine scientist, has 1 vesicle/pimple, declines prednisone.  Has some stress incontinence last 2-3 weeks with sneezing.  Has been seeing Dr. Oneida Alar for bilateral  feet OA, feeling better, has orthotics.  BMI is Body mass index is 34.72 kg/(m^2)., she is working on diet and exercise. Wt Readings from Last 3  Encounters:  03/24/15 193 lb (87.544 kg)  01/14/15 189 lb (85.73 kg)  08/13/14 185 lb (83.915 kg)    Names of Other Physician/Practitioners you currently use: 1. [facility]- here for primary care 2. [place], eye doctor, 01/2015 3.  Dr. Johnnye Sima, dentist, last visit q 6 months, doing invisiline Patient Care Team: Unk Pinto, MD as PCP - General (Internal Medicine) Dian Queen, MD as Consulting Physician (Obstetrics and Gynecology) Clent Jacks, MD as Consulting Physician (Ophthalmology) Lindwood Coke, MD as Consulting Physician (Dermatology)  Medication Review Current Outpatient Prescriptions on File Prior to Visit  Medication Sig Dispense Refill  . amphetamine-dextroamphetamine (ADDERALL) 30 MG tablet Take 1 tablet by mouth 2 (two) times daily. 60 tablet 0  . Cholecalciferol (VITAMIN D3) 5000 UNITS TABS Take 10,000 Units by mouth.    . Estradiol-Estriol-Progesterone (BIEST/PROGESTERONE) CREA Place onto the skin. Marland Kitchen05 mg/ml cream to apply daily    . famciclovir (FAMVIR) 500 MG tablet Take 1 tablet (500 mg total) by mouth 2 (two) times daily. 60 tablet 0  . Omega-3 Fatty Acids (FISH OIL) 1000 MG CAPS Take by mouth daily.    . Progesterone Micronized 10 % CREA Place onto the skin. Patient applies 2 % cream daily     No current facility-administered medications on file prior to visit.    Current Problems (verified) Patient Active Problem List   Diagnosis Date Noted  . Encounter for Medicare annual wellness exam 01/14/2015  . HTN, labile 08/01/2014  . Abnormal glucose 08/01/2014  . Vitamin D deficiency 08/01/2014  . Medication management 08/01/2014  . Obesity 07/04/2014  . Cavus deformity of foot 07/04/2014  . Arthritis, midfoot 07/04/2014  . Hyperlipemia 06/27/2013  . Attention deficit disorder (ADD)   . GERD (gastroesophageal reflux disease)   . Allergy   . Depression, major, in remission ([place])   . Anemia   . Ocular rosacea   .  HSV infection   . Osteopenia   . Lumbar degenerative disc disease     Screening Tests Health Maintenance  Topic Date Due  . ZOSTAVAX  02/10/2008  . PNA vac Low Risk Adult (1 of 2 - PCV13) 12/31/2013  . TETANUS/TDAP  03/29/2014  . COLONOSCOPY  01/14/2015  . INFLUENZA VACCINE  10/22/2015 (Originally 09/29/2014)  . MAMMOGRAM  12/21/2015  . DEXA SCAN  Completed  . Hepatitis C Screening  Completed     Immunization History  Administered Date(s) Administered  . DT 01/14/2015  . Pneumococcal-Unspecified 12/31/2012  . Td 03/29/2004    Preventative care: Last colonoscopy: 2006 due 2016 DUE Last mammogram: 11/2013 DUE Last pap smear/pelvic exam: 11/2013 DEXA 07/2012 due this year  Prior vaccinations: TD or Tdap: 2016  Influenza: declines Pneumococcal: 2014 Prevnar 13: needs Shingles/Zostavax: DUE  Allergies Allergies  Allergen Reactions  . Prednisone   . Statins     paralyzing  . Strattera [Atomoxetine Hcl]     Increased sadness   Surgical history Past Surgical History  Procedure Laterality Date  . Tonsillectomy and adenoidectomy    . Skin biopsy      ?basal/squam cell on chest   Family history Family History  Problem Relation Age of Onset  . Hypertension Mother   . Depression Mother   . Macular degeneration Mother   . Stroke Father   . Diabetes Father  Tobacco Social History  Substance Use Topics  . Smoking status: Former Smoker -- 0.50 packs/day for 15 years    Types: Cigarettes    Quit date: 03/23/1980  . Smokeless tobacco: Never Used  . Alcohol Use: 0.0 oz/week    0 Standard drinks or equivalent per week     Comment: social   MEDICARE WELLNESS OBJECTIVES: Tobacco use: She does not smoke.  Patient is a former smoker. If yes, counseling given Alcohol Current alcohol use: none Osteoporosis: postmenopausal estrogen deficiency and dietary calcium and/or vitamin D deficiency, History of fracture in the past year: no Fall risk: Low Risk Diet: in  general, a "healthy" diet   Physical activity: Current Exercise Habits:: Home exercise routine, Type of exercise: walking, Time (Minutes): 30, Frequency (Times/Week): 3, Weekly Exercise (Minutes/Week): 90, Intensity: Mild Cardiac risk factors: Cardiac Risk Factors include: advanced age (>52men, >66 women);dyslipidemia;hypertension;sedentary lifestyle;obesity (BMI >30kg/m2) Depression/mood screen:   Depression screen [facility] 2/9 03/25/2015  Decreased Interest 0  Down, Depressed, Hopeless 0  PHQ - 2 Score 0    ADLs:  In your present state of health, do you have any difficulty performing the following activities: 03/25/2015 01/14/2015  Hearing? N N  Vision? N Y  Difficulty concentrating or making decisions? N N  Walking or climbing stairs? N N  Dressing or bathing? N N  Doing errands, shopping? N N  Preparing Food and eating ? N N  Using the Toilet? N N  In the past six months, have you accidently leaked urine? N N  Do you have problems with loss of bowel control? N N  Managing your Medications? N N  Managing your Finances? N N  Housekeeping or managing your Housekeeping? N N     Cognitive Testing  Alert? Yes  Normal Appearance?Yes  Oriented to person? Yes  Place? Yes   Time? Yes  Recall of three objects?  Yes  Can perform simple calculations? Yes  Displays appropriate judgment?Yes  Can read the correct time from a watch face?Yes  EOL planning: Does patient have an advance directive?: Yes Type of Advance Directive: [place], Living will Does patient want to make changes to advanced directive?: No - Patient declined Copy of advanced directive(s) in chart?: No - copy requested   Objective:   Blood pressure 138/90, pulse 82, temperature 98.2 F (36.8 C), temperature source Temporal, resp. rate 18, height 5' 2.5" (1.588 m), weight 193 lb (87.544 kg). Body mass index is 34.72 kg/(m^2).  General appearance: alert, no distress, WD/WN,  female HEENT: normocephalic,  sclerae anicteric, TMs pearly, nares patent, no discharge or erythema, pharynx normal Oral cavity: MMM, no lesions Neck: supple, no lymphadenopathy, no thyromegaly, no masses Heart: RRR, normal S1, S2, no murmurs Lungs: CTA bilaterally, no wheezes, rhonchi, or rales Abdomen: +bs, soft, non tender, non distended, no masses, no hepatomegaly, no splenomegaly Musculoskeletal: nontender, no swelling, no obvious deformity Extremities: no edema, no cyanosis, no clubbing Pulses: 2+ symmetric, upper and lower extremities, normal cap refill Neurological: alert, oriented x 3, CN2-12 intact, strength normal upper extremities and lower extremities, sensation normal throughout, DTRs 2+ throughout, no cerebellar signs, gait normal Psychiatric: normal affect, behavior normal, pleasant  Skin: 1 left mid back at T9-T10 an erythematous scabbed papule, no other rashes, patient claims along dermatome there is pain to light touch.  Breast: defer Gyn: defer Rectal: defer   Medicare Attestation I have personally reviewed: The patient's medical and social history Their use of alcohol, tobacco or illicit drugs  Their current medications and supplements The patient's functional ability including ADLs,fall risks, home safety risks, cognitive, and hearing and visual impairment Diet and physical activities Evidence for depression or mood disorders  The patient's weight, height, BMI, and visual acuity have been recorded in the chart.  I have made referrals, counseling, and provided education to the patient based on review of the above and I have provided the patient with a written personalized care plan for preventive services.     Vicie Mutters, PA-C   03/25/2015

## 2015-03-25 ENCOUNTER — Encounter: Payer: Self-pay | Admitting: Physician Assistant

## 2015-03-30 ENCOUNTER — Encounter: Payer: Self-pay | Admitting: Physician Assistant

## 2015-03-30 ENCOUNTER — Other Ambulatory Visit: Payer: Self-pay | Admitting: Physician Assistant

## 2015-03-30 MED ORDER — GABAPENTIN 100 MG PO CAPS: 300.0000 mg | ORAL_CAPSULE | Freq: Three times a day (TID) | ORAL | Status: DC

## 2015-04-23 DIAGNOSIS — H3589 Other specified retinal disorders: Secondary | ICD-10-CM | POA: Diagnosis not present

## 2015-04-23 DIAGNOSIS — H16223 Keratoconjunctivitis sicca, not specified as Sjogren's, bilateral: Secondary | ICD-10-CM | POA: Diagnosis not present

## 2015-04-23 DIAGNOSIS — H2513 Age-related nuclear cataract, bilateral: Secondary | ICD-10-CM | POA: Diagnosis not present

## 2015-04-23 DIAGNOSIS — H31003 Unspecified chorioretinal scars, bilateral: Secondary | ICD-10-CM | POA: Diagnosis not present

## 2015-04-23 DIAGNOSIS — H10413 Chronic giant papillary conjunctivitis, bilateral: Secondary | ICD-10-CM | POA: Diagnosis not present

## 2015-04-24 ENCOUNTER — Other Ambulatory Visit: Payer: Self-pay | Admitting: Physician Assistant

## 2015-04-24 MED ORDER — GABAPENTIN 100 MG PO CAPS: 300.0000 mg | ORAL_CAPSULE | Freq: Three times a day (TID) | ORAL | Status: DC

## 2015-04-27 ENCOUNTER — Other Ambulatory Visit: Payer: Self-pay | Admitting: Physician Assistant

## 2015-04-27 MED ORDER — VITAMIN D (ERGOCALCIFEROL) 1.25 MG (50000 UNIT) PO CAPS: ORAL_CAPSULE | ORAL | Status: DC

## 2015-09-24 ENCOUNTER — Encounter: Payer: Self-pay | Admitting: Physician Assistant

## 2015-09-24 ENCOUNTER — Ambulatory Visit (INDEPENDENT_AMBULATORY_CARE_PROVIDER_SITE_OTHER): Payer: Medicare Other | Admitting: Physician Assistant

## 2015-09-24 VITALS — BP 118/70 | HR 69 | Temp 97.5°F | Resp 16 | Ht 62.5 in | Wt 179.0 lb

## 2015-09-24 DIAGNOSIS — Z79899 Other long term (current) drug therapy: Secondary | ICD-10-CM

## 2015-09-24 DIAGNOSIS — E785 Hyperlipidemia, unspecified: Secondary | ICD-10-CM | POA: Diagnosis not present

## 2015-09-24 DIAGNOSIS — F325 Major depressive disorder, single episode, in full remission: Secondary | ICD-10-CM | POA: Diagnosis not present

## 2015-09-24 DIAGNOSIS — IMO0001 Reserved for inherently not codable concepts without codable children: Secondary | ICD-10-CM

## 2015-09-24 DIAGNOSIS — F988 Other specified behavioral and emotional disorders with onset usually occurring in childhood and adolescence: Secondary | ICD-10-CM

## 2015-09-24 DIAGNOSIS — F909 Attention-deficit hyperactivity disorder, unspecified type: Secondary | ICD-10-CM

## 2015-09-24 DIAGNOSIS — R5383 Other fatigue: Secondary | ICD-10-CM | POA: Diagnosis not present

## 2015-09-24 DIAGNOSIS — R030 Elevated blood-pressure reading, without diagnosis of hypertension: Secondary | ICD-10-CM | POA: Diagnosis not present

## 2015-09-24 DIAGNOSIS — R7309 Other abnormal glucose: Secondary | ICD-10-CM

## 2015-09-24 DIAGNOSIS — E559 Vitamin D deficiency, unspecified: Secondary | ICD-10-CM

## 2015-09-24 DIAGNOSIS — E669 Obesity, unspecified: Secondary | ICD-10-CM | POA: Diagnosis not present

## 2015-09-24 LAB — BASIC METABOLIC PANEL WITH GFR
BUN: 20 mg/dL (ref 7–25)
CO2: 22 mmol/L (ref 20–31)
Calcium: 9.2 mg/dL (ref 8.6–10.4)
Chloride: 105 mmol/L (ref 98–110)
Creat: 0.95 mg/dL (ref 0.50–0.99)
GFR, EST AFRICAN AMERICAN: 72 mL/min (ref >=60)
GFR, EST NON AFRICAN AMERICAN: 62 mL/min (ref >=60)
Glucose, Bld: 101 mg/dL — ABNORMAL HIGH (ref 65–99)
POTASSIUM: 4.3 mmol/L (ref 3.5–5.3)
SODIUM: 142 mmol/L (ref 135–146)

## 2015-09-24 LAB — CBC WITH DIFFERENTIAL/PLATELET
BASOS PCT: 0 %
Basophils Absolute: 0 cells/uL (ref 0–200)
Eosinophils Absolute: 201 cells/uL (ref 15–500)
Eosinophils Relative: 3 %
HCT: 42.8 % (ref 35.0–45.0)
Hemoglobin: 14.1 g/dL (ref 11.7–15.5)
LYMPHS PCT: 23 %
Lymphs Abs: 1541 cells/uL (ref 850–3900)
MCH: 31.8 pg (ref 27.0–33.0)
MCHC: 32.9 g/dL (ref 32.0–36.0)
MCV: 96.6 fL (ref 80.0–100.0)
MONOS PCT: 8 %
MPV: 10.5 fL (ref 7.5–12.5)
Monocytes Absolute: 536 cells/uL (ref 200–950)
Neutro Abs: 4422 cells/uL (ref 1500–7800)
Neutrophils Relative %: 66 %
PLATELETS: 202 10*3/uL (ref 140–400)
RBC: 4.43 MIL/uL (ref 3.80–5.10)
RDW: 13.2 % (ref 11.0–15.0)
WBC: 6.7 10*3/uL (ref 3.8–10.8)

## 2015-09-24 LAB — LIPID PANEL
Cholesterol: 267 mg/dL — ABNORMAL HIGH (ref 125–200)
HDL: 86 mg/dL (ref >=46)
LDL CALC: 145 mg/dL — AB (ref <130)
TRIGLYCERIDES: 179 mg/dL — AB (ref <150)
Total CHOL/HDL Ratio: 3.1 Ratio (ref <=5.0)
VLDL: 36 mg/dL — AB (ref <30)

## 2015-09-24 LAB — VITAMIN B12: VITAMIN B 12: 1648 pg/mL — AB (ref 200–1100)

## 2015-09-24 LAB — MAGNESIUM: Magnesium: 1.9 mg/dL (ref 1.5–2.5)

## 2015-09-24 LAB — HEPATIC FUNCTION PANEL
ALBUMIN: 4.2 g/dL (ref 3.6–5.1)
ALK PHOS: 60 U/L (ref 33–130)
ALT: 13 U/L (ref 6–29)
AST: 15 U/L (ref 10–35)
BILIRUBIN INDIRECT: 0.4 mg/dL (ref 0.2–1.2)
BILIRUBIN TOTAL: 0.5 mg/dL (ref 0.2–1.2)
Bilirubin, Direct: 0.1 mg/dL (ref <=0.2)
Total Protein: 6.5 g/dL (ref 6.1–8.1)

## 2015-09-24 LAB — HEMOGLOBIN A1C
Hgb A1c MFr Bld: 5.5 % (ref <5.7)
Mean Plasma Glucose: 111 mg/dL

## 2015-09-24 LAB — TSH: TSH: 3.07 mIU/L

## 2015-09-24 MED ORDER — LISDEXAMFETAMINE DIMESYLATE 30 MG PO CAPS
30.0000 mg | ORAL_CAPSULE | Freq: Every day | ORAL | 0 refills | Status: DC
Start: 2015-09-24 — End: 2015-10-06

## 2015-09-24 NOTE — Progress Notes (Signed)
Assessment and Plan:  HTN, labile -     CBC with Differential/Platelet -     BASIC METABOLIC PANEL WITH GFR -     Hepatic function panel -     TSH  Hyperlipemia -     Lipid panel  Abnormal glucose -     Hemoglobin A1c  Obesity  Vitamin D deficiency -     VITAMIN D 25 Hydroxy (Vit-D Deficiency, Fractures)  Medication management -     Magnesium  Attention deficit disorder (ADD) -     lisdexamfetamine (VYVANSE) 30 MG capsule; Take 1 capsule (30 mg total) by mouth daily.  Depression, major, in remission ([place]) -     Methylmalonic acid, serum -     DHEA -     Testosterone, Total  Other fatigue -     Vitamin B12 -     Methylmalonic acid, serum -     DHEA -     Testosterone, Total  Continue diet and meds as discussed. Further disposition pending results of labs.  HPI 70 y.o. female  presents for 6 month follow up with hypertension, hyperlipidemia, prediabetes and vitamin D. Her blood pressure has been controlled at home, today their BP is BP: 118/70 She does not workout currently but wants to get back to it.. She denies chest pain, shortness of breath, dizziness.  She is not on cholesterol medication and denies myalgias. Her cholesterol is at goal. The cholesterol last visit was:   Lab Results  Component Value Date   CHOL 284 (H) 01/14/2015   HDL 87 01/14/2015   LDLCALC 167 (H) 01/14/2015   TRIG 152 (H) 01/14/2015   CHOLHDL 3.3 01/14/2015    Last A1C in the office was:  Lab Results  Component Value Date   HGBA1C 5.7 (H) 01/14/2015   Patient is on Vitamin D supplement.   Lab Results  Component Value Date   VD25OH 32 01/14/2015     Patient is on an ADD medication, she states that the medication is helping and she denies any adverse reactions.  She has had a bad reaction to different manufacturer, will go to different stores to get adderall. Check [place] system.  Has severe fatigue and states she does have withdrawals from not taking the adderall, could not tolerate  adderall ER either, if very tired, occ will want to fall asleep while driving, will listen to book on tape. Sleep very well at night. Taking care of mother, in assisted living, is very stressed.  BMI is Body mass index is 32.22 kg/m., she is working on diet and exercise. She is working with nutritionist, she would like her DHEA, testosterone, B12/meth acid, TSH.  Wt Readings from Last 3 Encounters:  09/24/15 179 lb (81.2 kg)  03/24/15 193 lb (87.5 kg)  01/14/15 189 lb (85.7 kg)    Current Medications:  Current Outpatient Prescriptions on File Prior to Visit  Medication Sig Dispense Refill  . amphetamine-dextroamphetamine (ADDERALL) 30 MG tablet Take 1 tablet by mouth 2 (two) times daily. 60 tablet 0  . Cholecalciferol (VITAMIN D3) 5000 UNITS TABS Take 10,000 Units by mouth.    . Estradiol-Estriol-Progesterone (BIEST/PROGESTERONE) CREA Place onto the skin. Marland Kitchen05 mg/ml cream to apply daily    . famciclovir (FAMVIR) 500 MG tablet Take 1 tablet (500 mg total) by mouth 2 (two) times daily. 60 tablet 0  . Omega-3 Fatty Acids (FISH OIL) 1000 MG CAPS Take by mouth daily.    . Progesterone Micronized 10 % CREA Place  onto the skin. Patient applies 2 % cream daily     No current facility-administered medications on file prior to visit.    Medical History:  Past Medical History:  Diagnosis Date  . Allergy   . Anemia   . Anxiety   . Attention deficit disorder (ADD)   . Cancer [facility])    ? basal/squam cell on chest  . Depression   . Dry eyes   . GERD (gastroesophageal reflux disease)   . HSV infection   . Lumbar degenerative disc disease   . Ocular rosacea   . Osteopenia    Allergies:  Allergies  Allergen Reactions  . Prednisone   . Statins     paralyzing  . Strattera [Atomoxetine Hcl]     Increased sadness    Review of Systems  Constitutional: Positive for malaise/fatigue.  HENT: Negative.   Eyes: Negative.   Respiratory: Negative.   Cardiovascular: Negative.   Gastrointestinal:  Negative.   Genitourinary: Negative.   Musculoskeletal: Negative.   Skin: Negative.   Neurological: Negative.   Endo/Heme/Allergies: Negative.   Psychiatric/Behavioral: Negative.     Family history- Review and unchanged Social history- Review and unchanged Physical Exam: BP 118/70   Pulse 69   Temp 97.5 F (36.4 C) (Temporal)   Resp 16   Ht 5' 2.5" (1.588 m)   Wt 179 lb (81.2 kg)   SpO2 96%   BMI 32.22 kg/m  Wt Readings from Last 3 Encounters:  09/24/15 179 lb (81.2 kg)  03/24/15 193 lb (87.5 kg)  01/14/15 189 lb (85.7 kg)   General Appearance: Well nourished, in no apparent distress. Eyes: PERRLA, EOMs, conjunctiva no swelling or erythema Sinuses: No Frontal/maxillary tenderness ENT/Mouth: Ext aud canals clear, TMs without erythema, bulging. No erythema, swelling, or exudate on post pharynx.  Tonsils not swollen or erythematous. Hearing normal.  Neck: Supple, thyroid normal.  Respiratory: Respiratory effort normal, BS equal bilaterally without rales, rhonchi, wheezing or stridor.  Cardio: RRR with no MRGs, prominent S2. Brisk peripheral pulses without edema.  Abdomen: Soft, + BS.  Non tender, no guarding, rebound, hernias, masses. Lymphatics: Non tender without lymphadenopathy.  Musculoskeletal: Full ROM, 5/5 strength, normal gait.  Skin: Warm, dry without rashes, lesions, ecchymosis.  Neuro: Cranial nerves intact. Normal muscle tone, no cerebellar symptoms. Sensation intact.  Psych: Awake and oriented X 3, normal affect, Insight and Judgment appropriate.    Vicie Mutters, PA-C 10:16 AM [facility]

## 2015-09-25 LAB — VITAMIN D 25 HYDROXY (VIT D DEFICIENCY, FRACTURES): VIT D 25 HYDROXY: 62 ng/mL (ref 30–100)

## 2015-09-25 LAB — TESTOSTERONE: TESTOSTERONE: 39 ng/dL

## 2015-09-27 LAB — METHYLMALONIC ACID, SERUM: Methylmalonic Acid, Quant: 163 nmol/L (ref 87–318)

## 2015-10-02 LAB — DHEA: DHEA: 112 ng/dL (ref 102–1185)

## 2015-10-06 ENCOUNTER — Other Ambulatory Visit: Payer: Self-pay | Admitting: Physician Assistant

## 2015-10-06 MED ORDER — AMPHETAMINE-DEXTROAMPHETAMINE 30 MG PO TABS: 30.0000 mg | ORAL_TABLET | Freq: Two times a day (BID) | ORAL | 0 refills | Status: DC

## 2015-10-24 DIAGNOSIS — H6691 Otitis media, unspecified, right ear: Secondary | ICD-10-CM | POA: Diagnosis not present

## 2015-10-24 DIAGNOSIS — H9201 Otalgia, right ear: Secondary | ICD-10-CM | POA: Diagnosis not present

## 2015-12-31 DIAGNOSIS — L821 Other seborrheic keratosis: Secondary | ICD-10-CM | POA: Diagnosis not present

## 2015-12-31 DIAGNOSIS — Z85828 Personal history of other malignant neoplasm of skin: Secondary | ICD-10-CM | POA: Diagnosis not present

## 2015-12-31 DIAGNOSIS — D225 Melanocytic nevi of trunk: Secondary | ICD-10-CM | POA: Diagnosis not present

## 2015-12-31 DIAGNOSIS — L738 Other specified follicular disorders: Secondary | ICD-10-CM | POA: Diagnosis not present

## 2015-12-31 DIAGNOSIS — D2272 Melanocytic nevi of left lower limb, including hip: Secondary | ICD-10-CM | POA: Diagnosis not present

## 2016-01-15 ENCOUNTER — Encounter: Payer: Self-pay | Admitting: Physician Assistant

## 2016-01-15 ENCOUNTER — Ambulatory Visit (INDEPENDENT_AMBULATORY_CARE_PROVIDER_SITE_OTHER): Payer: Medicare Other | Admitting: Internal Medicine

## 2016-01-15 ENCOUNTER — Encounter: Payer: Self-pay | Admitting: Internal Medicine

## 2016-01-15 VITALS — BP 138/84 | HR 88 | Temp 98.2°F | Resp 16 | Ht 62.5 in | Wt 178.0 lb

## 2016-01-15 DIAGNOSIS — F321 Major depressive disorder, single episode, moderate: Secondary | ICD-10-CM | POA: Diagnosis not present

## 2016-01-15 MED ORDER — AMPHETAMINE-DEXTROAMPHETAMINE 30 MG PO TABS: 30.0000 mg | ORAL_TABLET | Freq: Two times a day (BID) | ORAL | 0 refills | Status: DC

## 2016-01-15 MED ORDER — FAMCICLOVIR 500 MG PO TABS: 500.0000 mg | ORAL_TABLET | Freq: Two times a day (BID) | ORAL | 0 refills | Status: DC

## 2016-01-15 MED ORDER — BUPROPION HCL ER (XL) 150 MG PO TB24: 150.0000 mg | ORAL_TABLET | ORAL | 2 refills | Status: DC

## 2016-01-15 NOTE — Progress Notes (Signed)
Assessment and Plan:   1. Moderate single current episode of major depressive disorder (HCC) -wellbutrin xl -recommended recheck in 4-6 weeks -genomind performed and sent off.  Patient is aware that this testing may not be covered by insurance and that both myself and Dr. Melford Aase do not find this test very useful.    HPI 70 y.o.female presents for increased anxiety and depression.  She reports that her mother has end of life dementia and has been in the memory care and psych unit.  She reports that she is having a lot of anxiety.  She reports that her mother is being released next week.  She has to be discharged.   She reports that she has been on gabapentin and she stopped that because she felt that this was making her worse.  She does want to have a genomind test done to see which medication will work best for her.  It was sent to our office to be done.  She would like to request trying wellbutrin first while we wait on the result of this test.  She does have fatigue, feels like she is hiding from the world, is stress eating, and is unable to focus.     Past Medical History:  Diagnosis Date  . Allergy   . Anemia   . Anxiety   . Attention deficit disorder (ADD)   . Cancer [facility])    ? basal/squam cell on chest  . Depression   . Dry eyes   . GERD (gastroesophageal reflux disease)   . HSV infection   . Lumbar degenerative disc disease   . Ocular rosacea   . Osteopenia      Allergies  Allergen Reactions  . Prednisone   . Statins     paralyzing  . Strattera [Atomoxetine Hcl]     Increased sadness      Current Outpatient Prescriptions on File Prior to Visit  Medication Sig Dispense Refill  . amphetamine-dextroamphetamine (ADDERALL) 30 MG tablet Take 1 tablet by mouth 2 (two) times daily. 60 tablet 0  . Cholecalciferol (VITAMIN D3) 5000 UNITS TABS Take 10,000 Units by mouth.    . Estradiol-Estriol-Progesterone (BIEST/PROGESTERONE) CREA Place onto the skin. Marland Kitchen05 mg/ml cream to  apply daily    . famciclovir (FAMVIR) 500 MG tablet Take 1 tablet (500 mg total) by mouth 2 (two) times daily. 60 tablet 0  . Omega-3 Fatty Acids (FISH OIL) 1000 MG CAPS Take by mouth daily.    . Progesterone Micronized 10 % CREA Place onto the skin. Patient applies 2 % cream daily     No current facility-administered medications on file prior to visit.     ROS: all negative except above.   Physical Exam: Filed Weights   01/15/16 0906  Weight: 178 lb (80.7 kg)   BP 138/84   Pulse 88   Temp 98.2 F (36.8 C) (Temporal)   Resp 16   Ht 5' 2.5" (1.588 m)   Wt 178 lb (80.7 kg)   BMI 32.04 kg/m  General Appearance: Well developed well nourished, non-toxic appearing in no apparent distress. Eyes: PERRLA, EOMs, conjunctiva w/ no swelling or erythema or discharge Sinuses: No Frontal/maxillary tenderness ENT/Mouth: Ear canals clear without swelling or erythema.  TM's normal bilaterally with no retractions, bulging, or loss of landmarks.   Neck: Supple, thyroid normal, no notable JVD  Respiratory: Respiratory effort normal, Clear breath sounds anteriorly and posteriorly bilaterally without rales, rhonchi, wheezing or stridor. No retractions or accessory muscle usage. Cardio: RRR  with no MRGs.   Abdomen: Soft, + BS.  Non tender, no guarding, rebound, hernias, masses.  Musculoskeletal: Full ROM, 5/5 strength, normal gait.  Skin: Warm, dry without rashes  Neuro: Awake and oriented X 3, Cranial nerves intact. Normal muscle tone, no cerebellar symptoms. Sensation intact.  Psych: normal affect, Insight and Judgment appropriate.     Starlyn Skeans, PA-C 9:29 AM [facility]

## 2016-02-10 ENCOUNTER — Other Ambulatory Visit: Payer: Self-pay | Admitting: *Deleted

## 2016-02-10 MED ORDER — BUPROPION HCL ER (XL) 150 MG PO TB24: 150.0000 mg | ORAL_TABLET | ORAL | 2 refills | Status: DC

## 2016-03-03 DIAGNOSIS — Z1231 Encounter for screening mammogram for malignant neoplasm of breast: Secondary | ICD-10-CM | POA: Diagnosis not present

## 2016-03-03 DIAGNOSIS — Z01419 Encounter for gynecological examination (general) (routine) without abnormal findings: Secondary | ICD-10-CM | POA: Diagnosis not present

## 2016-03-03 DIAGNOSIS — Z124 Encounter for screening for malignant neoplasm of cervix: Secondary | ICD-10-CM | POA: Diagnosis not present

## 2016-03-24 ENCOUNTER — Ambulatory Visit (INDEPENDENT_AMBULATORY_CARE_PROVIDER_SITE_OTHER): Payer: Medicare Other | Admitting: Internal Medicine

## 2016-03-24 ENCOUNTER — Encounter: Payer: Self-pay | Admitting: Internal Medicine

## 2016-03-24 VITALS — BP 124/70 | HR 80 | Temp 98.0°F | Resp 16 | Ht 62.5 in | Wt 180.0 lb

## 2016-03-24 DIAGNOSIS — M546 Pain in thoracic spine: Secondary | ICD-10-CM

## 2016-03-24 MED ORDER — ACETAMINOPHEN-CODEINE #3 300-30 MG PO TABS: 1.0000 | ORAL_TABLET | Freq: Three times a day (TID) | ORAL | 0 refills | Status: DC | PRN

## 2016-03-24 MED ORDER — AMPHETAMINE-DEXTROAMPHETAMINE 30 MG PO TABS: 30.0000 mg | ORAL_TABLET | Freq: Two times a day (BID) | ORAL | 0 refills | Status: DC

## 2016-03-24 MED ORDER — BUPROPION HCL ER (XL) 300 MG PO TB24: 300.0000 mg | ORAL_TABLET | ORAL | 2 refills | Status: DC

## 2016-03-24 MED ORDER — VALACYCLOVIR HCL 500 MG PO TABS: 1000.0000 mg | ORAL_TABLET | Freq: Three times a day (TID) | ORAL | 0 refills | Status: AC

## 2016-03-24 NOTE — Progress Notes (Signed)
   Subjective:    Patient ID: Kathy Cook, female    DOB: 1945/11/28, 71 y.o.   MRN: [id number]  HPI  Patient presents to the office for evaluation of right upper back pain.  She reports that she has been having some chills and fatigue.  She reports that she has still had some pain on Tuesday.   She reports that she was told by her husband that there was a bump.  She reports that she has been taking 1000 mg TID.  She reports that she has been taking this for 1 day.  She reports that she refuses to take prednisone because it made her very sick to her stomach.  She report that she has not had any injury that she can think of.  She reports that this feels the way it did prior.  She has been taking tylenol over the counter.  She reports that tylenol is the only thing that is helping her.  She has not been taking gabapentin.  She reports that this was not helping either.  She stopped the gabapentin a year ago.  She reports that neither of these helped.    Review of Systems  Constitutional: Positive for chills. Negative for fatigue and fever.  Gastrointestinal: Negative for constipation, diarrhea, nausea and vomiting.  Skin: Positive for rash.       Objective:   Physical Exam  Constitutional: She is oriented to person, place, and time. She appears well-developed and well-nourished. No distress.  HENT:  Head: Normocephalic.  Mouth/Throat: Oropharynx is clear and moist. No oropharyngeal exudate.  Eyes: Conjunctivae are normal. No scleral icterus.  Neck: Normal range of motion. Neck supple. No JVD present. No thyromegaly present.  Cardiovascular: Normal rate, regular rhythm, normal heart sounds and intact distal pulses.  Exam reveals no gallop and no friction rub.   No murmur heard. Pulmonary/Chest: Effort normal and breath sounds normal. No respiratory distress. She has no wheezes. She has no rales. She exhibits no tenderness.  Abdominal: Soft. Bowel sounds are normal. She exhibits no  distension and no mass. There is no tenderness. There is no rebound and no guarding.  Musculoskeletal: Normal range of motion.  Lymphadenopathy:    She has no cervical adenopathy.  Neurological: [name], place, and time.  Skin: Skin is warm and dry. No rash noted. She is not diaphoretic. No erythema. No pallor.     Psychiatric: She has a normal mood and affect. Her behavior is normal. Judgment and thought content normal.  Nursing note and vitals reviewed.   Vitals:   03/24/16 1003  BP: 124/70  Pulse: 80  Resp: 16  Temp: 98 F (36.7 C)          Assessment & Plan:    1. Acute right-sided thoracic back pain -no evidence of rash on the right scapular area -tender to palpation -patient absolutely convinced that this is shingles although I am not.  Will do valtrex 1,000 mg TID x 1 week -try tylenol 3 qhs as needed for pain -likely muscular strain -follow-up as needed.

## 2016-04-04 ENCOUNTER — Ambulatory Visit (INDEPENDENT_AMBULATORY_CARE_PROVIDER_SITE_OTHER): Payer: Medicare Other | Admitting: Physician Assistant

## 2016-04-04 ENCOUNTER — Encounter: Payer: Self-pay | Admitting: Physician Assistant

## 2016-04-04 VITALS — BP 116/66 | HR 71 | Temp 97.6°F | Resp 16 | Ht 62.0 in | Wt 177.4 lb

## 2016-04-04 DIAGNOSIS — I10 Essential (primary) hypertension: Secondary | ICD-10-CM

## 2016-04-04 DIAGNOSIS — Z0001 Encounter for general adult medical examination with abnormal findings: Secondary | ICD-10-CM

## 2016-04-04 DIAGNOSIS — Z0000 Encounter for general adult medical examination without abnormal findings: Secondary | ICD-10-CM

## 2016-04-04 DIAGNOSIS — B009 Herpesviral infection, unspecified: Secondary | ICD-10-CM

## 2016-04-04 DIAGNOSIS — T7840XD Allergy, unspecified, subsequent encounter: Secondary | ICD-10-CM

## 2016-04-04 DIAGNOSIS — E785 Hyperlipidemia, unspecified: Secondary | ICD-10-CM

## 2016-04-04 DIAGNOSIS — E559 Vitamin D deficiency, unspecified: Secondary | ICD-10-CM | POA: Diagnosis not present

## 2016-04-04 DIAGNOSIS — R7309 Other abnormal glucose: Secondary | ICD-10-CM

## 2016-04-04 DIAGNOSIS — R202 Paresthesia of skin: Secondary | ICD-10-CM

## 2016-04-04 DIAGNOSIS — M8580 Other specified disorders of bone density and structure, unspecified site: Secondary | ICD-10-CM

## 2016-04-04 DIAGNOSIS — Z79899 Other long term (current) drug therapy: Secondary | ICD-10-CM | POA: Diagnosis not present

## 2016-04-04 DIAGNOSIS — D649 Anemia, unspecified: Secondary | ICD-10-CM

## 2016-04-04 DIAGNOSIS — F325 Major depressive disorder, single episode, in full remission: Secondary | ICD-10-CM

## 2016-04-04 DIAGNOSIS — Z136 Encounter for screening for cardiovascular disorders: Secondary | ICD-10-CM | POA: Diagnosis not present

## 2016-04-04 DIAGNOSIS — F988 Other specified behavioral and emotional disorders with onset usually occurring in childhood and adolescence: Secondary | ICD-10-CM

## 2016-04-04 DIAGNOSIS — K219 Gastro-esophageal reflux disease without esophagitis: Secondary | ICD-10-CM

## 2016-04-04 DIAGNOSIS — L718 Other rosacea: Secondary | ICD-10-CM

## 2016-04-04 DIAGNOSIS — R0989 Other specified symptoms and signs involving the circulatory and respiratory systems: Secondary | ICD-10-CM

## 2016-04-04 LAB — LIPID PANEL
CHOL/HDL RATIO: 4.1 ratio (ref <5.0)
Cholesterol: 320 mg/dL — ABNORMAL HIGH (ref <200)
HDL: 79 mg/dL (ref >50)
LDL CALC: 203 mg/dL — AB (ref <100)
Triglycerides: 188 mg/dL — ABNORMAL HIGH (ref <150)
VLDL: 38 mg/dL — ABNORMAL HIGH (ref <30)

## 2016-04-04 LAB — CBC WITH DIFFERENTIAL/PLATELET
BASOS ABS: 63 {cells}/uL (ref 0–200)
BASOS PCT: 1 %
EOS ABS: 189 {cells}/uL (ref 15–500)
Eosinophils Relative: 3 %
HCT: 42.3 % (ref 35.0–45.0)
HEMOGLOBIN: 14 g/dL (ref 11.7–15.5)
LYMPHS ABS: 1260 {cells}/uL (ref 850–3900)
Lymphocytes Relative: 20 %
MCH: 32 pg (ref 27.0–33.0)
MCHC: 33.1 g/dL (ref 32.0–36.0)
MCV: 96.8 fL (ref 80.0–100.0)
MONO ABS: 693 {cells}/uL (ref 200–950)
MPV: 9.8 fL (ref 7.5–12.5)
Monocytes Relative: 11 %
NEUTROS ABS: 4095 {cells}/uL (ref 1500–7800)
Neutrophils Relative %: 65 %
PLATELETS: 211 10*3/uL (ref 140–400)
RBC: 4.37 MIL/uL (ref 3.80–5.10)
RDW: 13.4 % (ref 11.0–15.0)
WBC: 6.3 10*3/uL (ref 3.8–10.8)

## 2016-04-04 LAB — BASIC METABOLIC PANEL WITH GFR
BUN: 20 mg/dL (ref 7–25)
CHLORIDE: 105 mmol/L (ref 98–110)
CO2: 30 mmol/L (ref 20–31)
CREATININE: 1.01 mg/dL — AB (ref 0.50–0.99)
Calcium: 9.5 mg/dL (ref 8.6–10.4)
GFR, Est African American: 66 mL/min (ref >=60)
GFR, Est Non African American: 57 mL/min — ABNORMAL LOW (ref >=60)
Glucose, Bld: 89 mg/dL (ref 65–99)
POTASSIUM: 4.5 mmol/L (ref 3.5–5.3)
Sodium: 142 mmol/L (ref 135–146)

## 2016-04-04 LAB — TSH: TSH: 2.9 m[IU]/L

## 2016-04-04 LAB — IRON AND TIBC
%SAT: 33 % (ref 11–50)
IRON: 101 ug/dL (ref 45–160)
TIBC: 306 ug/dL (ref 250–450)
UIBC: 205 ug/dL (ref 125–400)

## 2016-04-04 LAB — HEPATIC FUNCTION PANEL
ALK PHOS: 56 U/L (ref 33–130)
ALT: 11 U/L (ref 6–29)
AST: 13 U/L (ref 10–35)
Albumin: 4.2 g/dL (ref 3.6–5.1)
BILIRUBIN DIRECT: 0.1 mg/dL (ref <=0.2)
BILIRUBIN TOTAL: 0.6 mg/dL (ref 0.2–1.2)
Indirect Bilirubin: 0.5 mg/dL (ref 0.2–1.2)
Total Protein: 6.6 g/dL (ref 6.1–8.1)

## 2016-04-04 LAB — FERRITIN: FERRITIN: 166 ng/mL (ref 20–288)

## 2016-04-04 NOTE — Progress Notes (Signed)
CPE and follow up visit.  Assessment:   HTN, labile - continue medications, DASH diet, exercise and monitor at home. Call if greater than 130/80.    Hyperlipemia -continue medications, check lipids, decrease fatty foods, increase activity.   Obesity Obesity with co morbidities- long discussion about weight loss, diet, and exercise   Attention deficit disorder (ADD) Continue adderall  Abnormal glucose Discussed general issues about diabetes pathophysiology and management., Educational material distributed., Suggested low cholesterol diet., Encouraged aerobic exercise., Discussed foot care., Reminded to get yearly retinal exam.   Anemia, unspecified anemia type - monitor, continue iron supp with Vitamin C and increase green leafy veggies   Allergy, subsequent encounter Continue OTC allergy pills  Depression, major, in remission ([place]) remission  Ocular rosacea Follow up eye doctor   Osteopenia Get DEXA, continue vitamin D,Calcium  Gastroesophageal reflux disease, esophagitis presence not specified Continue PPI/H2 blocker, diet discussed  Vitamin D deficiency - VITAMIN D 25 Hydroxy (Vit-D Deficiency, Fractures)   Medication management - Magnesium  HSV infection Refill meds  Complete physical Will schedule MGM and colonoscopy  Paresthesias explained shingles can NOT be bilateral, discomfort can be stress from mother versus neck/cervical, declines work up at this time, lyrica samples given.   Future Appointments Date Time Provider [place]  04/05/2017 9:00 AM Vicie Mutters, PA-C [id number] None      Plan:   During the course of the visit the patient was educated and counseled about appropriate screening and preventive services including:    Pneumococcal vaccine   Influenza vaccine  Td vaccine  Screening electrocardiogram  Screening mammography  Bone densitometry screening  Colorectal cancer screening  Diabetes screening  Glaucoma  screening  Nutrition counseling   Advanced directives: given info   Subjective:   Kathy Cook is a 71 y.o. female who presents for Medicare Annual Wellness Visit and 3 month follow up on hypertension, hyperlipidemia, vitamin D def.   Her blood pressure has been controlled at home, today their BP is BP: 116/66 She does workout, walks. She denies chest pain, shortness of breath, dizziness.  She is not on cholesterol medication, can not tolerate statins and never tried zetia.  Her cholesterol is not at goal. The cholesterol last visit was:   Lab Results  Component Value Date   CHOL 267 (H) 09/24/2015   HDL 86 09/24/2015   LDLCALC 145 (H) 09/24/2015   TRIG 179 (H) 09/24/2015   CHOLHDL 3.1 09/24/2015   Last A1C in the office was:  Lab Results  Component Value Date   HGBA1C 5.5 09/24/2015   Patient is on Vitamin D supplement. Lab Results  Component Value Date   VD25OH 62 09/24/2015   She does well on the Adderall, helps with concentration.  Has HSV 1 and has famcyclovir for occ out break.  She has extra stress with her mom, she has had MI, broken hip, now in NH in [place], has been having a lot of stress with that, now [facility] [place] [facility] [place], mother 72.  Has been seeing Dr. Oneida Alar for bilateral feet OA, feeling better, has orthotics.  BMI is Body mass index is 32.45 kg/m., she is working on diet and exercise. Wt Readings from Last 3 Encounters:  04/04/16 177 lb 6.4 oz (80.5 kg)  03/24/16 180 lb (81.6 kg)  01/15/16 178 lb (80.7 kg)    Names of Other Physician/Practitioners you currently use: 1. [facility]- here for primary care 2. [place], eye doctor, 04/2015  3.  Dr. Johnnye Sima, dentist, last visit q 6 months Patient Care Team: Unk Pinto, MD as PCP - General (Internal Medicine) Dian Queen, MD as Consulting Physician (Obstetrics and Gynecology) Clent Jacks, MD as Consulting Physician (Ophthalmology) Lindwood Coke,  MD as Consulting Physician (Dermatology)  Medication Review Current Outpatient Prescriptions on File Prior to Visit  Medication Sig Dispense Refill  . acetaminophen-codeine (TYLENOL #3) 300-30 MG tablet Take 1-2 tablets by mouth every 8 (eight) hours as needed for moderate pain. 60 tablet 0  . amphetamine-dextroamphetamine (ADDERALL) 30 MG tablet Take 1 tablet by mouth 2 (two) times daily. 60 tablet 0  . buPROPion (WELLBUTRIN XL) 300 MG 24 hr tablet Take 1 tablet (300 mg total) by mouth every morning. 30 tablet 2  . Cholecalciferol (VITAMIN D3) 5000 UNITS TABS Take 10,000 Units by mouth.    . Estradiol-Estriol-Progesterone (BIEST/PROGESTERONE) CREA Place onto the skin. Marland Kitchen05 mg/ml cream to apply daily    . famciclovir (FAMVIR) 500 MG tablet Take 1 tablet (500 mg total) by mouth 2 (two) times daily. 60 tablet 0  . Omega-3 Fatty Acids (FISH OIL) 1000 MG CAPS Take by mouth daily.    . Progesterone Micronized 10 % CREA Place onto the skin. Patient applies 2 % cream daily    . valACYclovir (VALTREX) 500 MG tablet Take 2 tablets (1,000 mg total) by mouth 3 (three) times daily. 120 tablet 0   No current facility-administered medications on file prior to visit.     Current Problems (verified) Patient Active Problem List   Diagnosis Date Noted  . Encounter for Medicare annual wellness exam 01/14/2015  . Labile hypertension 08/01/2014  . Abnormal glucose 08/01/2014  . Vitamin D deficiency 08/01/2014  . Medication management 08/01/2014  . Obesity 07/04/2014  . Cavus deformity of foot 07/04/2014  . Arthritis, midfoot 07/04/2014  . Hyperlipemia 06/27/2013  . Attention deficit disorder (ADD)   . GERD (gastroesophageal reflux disease)   . Allergy   . Depression, major, in remission ([place])   . Anemia   . Ocular rosacea   . HSV infection   . Osteopenia   . Lumbar degenerative disc disease     Screening Tests Immunization History  Administered Date(s) Administered  . DT 01/14/2015  .  Pneumococcal Conjugate-13 03/24/2015  . Pneumococcal-Unspecified 12/31/2012  . Td 03/29/2004    Preventative care: Last colonoscopy: 2006 due 2016 OVER DUE Last mammogram: 02/2016 Last pap smear/pelvic exam: 02/2016  DEXA 2016 getting next year at GYN  Prior vaccinations: TD or Tdap: 2016  Influenza: declines Pneumococcal: 2014 Prevnar 13: 2017 Shingles/Zostavax: DUE  Allergies Allergies  Allergen Reactions  . Prednisone   . Statins     paralyzing  . Strattera [Atomoxetine Hcl]     Increased sadness    SURGICAL HISTORY She  has a past surgical history that includes Tonsillectomy and adenoidectomy and Skin biopsy. FAMILY HISTORY Her family history includes Depression in her mother; Diabetes in her father; Hypertension in her mother; Macular degeneration in her mother; Stroke in her father. SOCIAL HISTORY She  reports that she quit smoking about 36 years ago. Her smoking use included Cigarettes. She has a 7.50 pack-year smoking history. She has never used smokeless tobacco. She reports that she drinks alcohol. She reports that she does not use drugs.  Review of Systems  Constitutional: Negative.   HENT: Negative.   Eyes: Negative.   Respiratory: Negative.   Cardiovascular: Negative.   Gastrointestinal: Negative.   Genitourinary: Negative.   Musculoskeletal: Negative.  Skin: Negative.   Neurological: Positive for tingling (back worse with stress). Negative for dizziness, tremors, sensory change, speech change, focal weakness, seizures, loss of consciousness and headaches.     Objective:   Blood pressure 116/66, pulse 71, temperature 97.6 F (36.4 C), resp. rate 16, height 5\' 2"  (1.575 m), weight 177 lb 6.4 oz (80.5 kg), SpO2 98 %. Body mass index is 32.45 kg/m.  General appearance: alert, no distress, WD/WN,  female HEENT: normocephalic, sclerae anicteric, TMs pearly, nares patent, no discharge or erythema, pharynx normal Oral cavity: MMM, no lesions Neck:  supple, no lymphadenopathy, no thyromegaly, no masses Heart: RRR, normal S1, S2, no murmurs Lungs: CTA bilaterally, no wheezes, rhonchi, or rales Abdomen: +bs, soft, non tender, non distended, no masses, no hepatomegaly, no splenomegaly Musculoskeletal: nontender, no swelling, no obvious deformity Extremities: no edema, no cyanosis, no clubbing Pulses: 2+ symmetric, upper and lower extremities, normal cap refill Neurological: alert, oriented x 3, CN2-12 intact, strength normal upper extremities and lower extremities, sensation normal throughout, DTRs 2+ throughout, no cerebellar signs, gait normal Psychiatric: normal affect, behavior normal, pleasant  Skin: 1 left mid back at T9-T10 an erythematous scabbed papule, no other rashes, patient claims along dermatome there is pain to light touch.  Breast: defer Gyn: defer Rectal: defer    Vicie Mutters, PA-C   04/04/2016

## 2016-04-04 NOTE — Patient Instructions (Addendum)
Due for colonoscopy, call for colonoscopy  Check with your insurance about shingles vaccine  Can take the lyrica samples for nerve pain. It can make you sleepy so we suggest trying it at night first and please plan to not drive or do anything strenuous. Also please do not take this medication with alcohol.  Start out 1 pill at night before bed, can increase to 2 pills at night before bed. Please call the office if you have any side effects.   Can take 3 pills a day however you would like  Some examples: - 1 breakfast, lunch, bedtime. - 1 at breakfast, 2 at bed time  How should I use this medicine? Take this medicine by mouth with a glass of water. Follow the directions on the prescription label. You can take this medicine with or without food. Take your doses at regular intervals. Do not take your medicine more often than directed. Do not stop taking except on your doctor's advice.  What if I miss a dose? If you miss a dose, take it as soon as you can. If it is almost time for your next dose, take only that dose. Do not take double or extra doses.  What should I watch for while using this medicine? Tell your doctor or healthcare professional if your symptoms do not start to get better or if they get worse.   You may get drowsy or dizzy. Do not drive, use machinery, or do anything that needs mental alertness until you know how this medicine affects you. Do not stand or sit up quickly, especially if you are an older patient. This reduces the risk of dizzy or fainting spells. Alcohol may interfere with the effect of this medicine. Avoid alcoholic drinks. If you have a heart condition, like congestive heart failure, and notice that you are retaining water and have swelling in your hands or feet, contact your health care provider immediately.  What side effects may I notice from receiving this medicine? Side effects that you should report to your doctor or health care professional as soon as  possible and are very rare: -allergic reactions like skin rash, itching or hives, swelling of the face, lips, or tongue -breathing problems -changes in vision -jerking or unusual movements of any part of your body -suicidal thoughts or other mood changes -swelling of the ankles, feet, hands -unusual bruising or bleeding  Side effects that usually do not require medical attention (Report these to your doctor or health care professional if they continue or are bothersome.): -dizziness -drowsiness -dry mouth -nausea -tremors      Simple math prevails.    1st - exercise does not produce significant weight loss - at best one converts fat into muscle , "bulks up", loses inches, but usually stays "weight neutral"     2nd - think of your body weightas a check book: If you eat more calories than you burn up - you save money or gain weight .... Or if you spend more money than you put in the check book, ie burn up more calories than you eat, then you lose weight     3rd - if you walk or run 1 mile, you burn up 100 calories - you have to burn up 3,500 calories to lose 1 pound, ie you have to walk/run 35 miles to lose 1 measly pound. So if you want to lose 10 #, then you have to walk/run 350 miles, so.... clearly exercise is not the solution.  4. So if you consume 1,500 calories, then you have to burn up the equivalent of 15 miles to stay weight neutral - It also stands to reason that if you consume 1,500 cal/day and don't lose weight, then you must be burning up about 1,500 cals/day to stay weight neutral.     5. If you really want to lose weight, you must cut your calorie intake 300 calories /day and at that rate you should lose about 1 # every 3 days.   6. Please purchase Dr Fara Olden Fuhrman's book(s) "The End of Dieting" & "Eat to Live" . It has some great concepts and recipes.      Neuropathic Pain Introduction Neuropathic pain is pain caused by damage to the nerves that are responsible  for certain sensations in your body (sensory nerves). The pain can be caused by damage to:  The sensory nerves that send signals to your spinal cord and brain (peripheral nervous system).  The sensory nerves in your brain or spinal cord (central nervous system). Neuropathic pain can make you more sensitive to pain. What would be a minor sensation for most people may feel very painful if you have neuropathic pain. This is usually a long-term condition that can be difficult to treat. The type of pain can differ from person to person. It may start suddenly (acute), or it may develop slowly and last for a long time (chronic). Neuropathic pain may come and go as damaged nerves heal or may stay at the same level for years. It often causes emotional distress, loss of sleep, and a lower quality of life. What are the causes? The most common cause of damage to a sensory nerve is diabetes. Many other diseases and conditions can also cause neuropathic pain. Causes of neuropathic pain can be classified as:  Toxic. Many drugs and chemicals can cause toxic damage. The most common cause of toxic neuropathic pain is damage from drug treatment for cancer (chemotherapy).  Metabolic. This type of pain can happen when a disease causes imbalances that damage nerves. Diabetes is the most common of these diseases. Vitamin B deficiency caused by long-term alcohol abuse is another common cause.  Traumatic. Any injury that cuts, crushes, or stretches a nerve can cause damage and pain. A common example is feeling pain after losing an arm or leg (phantom limb pain).  Compression-related. If a sensory nerve gets trapped or compressed for a long period of time, the blood supply to the nerve can be cut off.  Vascular. Many blood vessel diseases can cause neuropathic pain by decreasing blood supply and oxygen to nerves.  Autoimmune. This type of pain results from diseases in which the body's defense system mistakenly attacks  sensory nerves. Examples of autoimmune diseases that can cause neuropathic pain include lupus and multiple sclerosis.  Inherited. Neuropathic pain can be a symptom of many diseases that are passed down through families (genetic). What are the signs or symptoms? The main symptom is pain. Neuropathic pain is often described as:  Burning.  Shock-like.  Stinging.  Hot or cold.  Itching. How is this diagnosed? No single test can diagnose neuropathic pain. Your health care provider will do a physical exam and ask you about your pain. You may use a pain scale to describe how bad your pain is. You may also have tests to see if you have a high sensitivity to pain and to help find the cause and location of any sensory nerve damage. These tests may include:  Imaging studies, such as:  X-rays.  CT scan.  MRI.  Nerve conduction studies to test how well nerve signals travel through your sensory nerves (electrodiagnostic testing).  Stimulating your sensory nerves through electrodes on your skin and measuring the response in your spinal cord and brain (somatosensory evoked potentials). How is this treated? Treatment for neuropathic pain may change over time. You may need to try different treatment options or a combination of treatments. Some options include:  Over-the-counter pain relievers.  Prescription medicines. Some medicines used to treat other conditions may also help neuropathic pain. These include medicines to:  Control seizures (anticonvulsants).  Relieve depression (antidepressants).  Prescription-strength pain relievers (narcotics). These are usually used when other pain relievers do not help.  Transcutaneous nerve stimulation (TENS). This uses electrical currents to block painful nerve signals. The treatment is painless.  Topical and local anesthetics. These are medicines that numb the nerves. They can be injected as a nerve block or applied to the skin.  Alternative  treatments, such as:  Acupuncture.  Meditation.  Massage.  Physical therapy.  Pain management programs.  Counseling. Follow these instructions at home:  Learn as much as you can about your condition.  Take medicines only as directed by your health care provider.  Work closely with all your health care providers to find what works best for you.  Have a good support system at home.  Consider joining a chronic pain support group. Contact a health care provider if:  Your pain treatments are not helping.  You are having side effects from your medicines.  You are struggling with fatigue, mood changes, depression, or anxiety. This information is not intended to replace advice given to you by your health care provider. Make sure you discuss any questions you have with your health care provider. Document Released: 11/12/2003 Document Revised: 09/04/2015 Document Reviewed: 07/25/2013  2017 [employer]

## 2016-04-05 LAB — MICROALBUMIN / CREATININE URINE RATIO
Creatinine, Urine: 249 mg/dL (ref 20–320)
Microalb Creat Ratio: 2 mcg/mg creat (ref <30)
Microalb, Ur: 0.5 mg/dL

## 2016-04-05 LAB — URINALYSIS, MICROSCOPIC ONLY
BACTERIA UA: NONE SEEN [HPF]
CASTS: NONE SEEN [LPF]
CRYSTALS: NONE SEEN [HPF]
RBC / HPF: NONE SEEN RBC/HPF (ref <=2)
Squamous Epithelial / LPF: NONE SEEN [HPF] (ref <=5)
Yeast: NONE SEEN [HPF]

## 2016-04-05 LAB — MAGNESIUM: MAGNESIUM: 2.1 mg/dL (ref 1.5–2.5)

## 2016-04-05 LAB — URINALYSIS, ROUTINE W REFLEX MICROSCOPIC
Bilirubin Urine: NEGATIVE
GLUCOSE, UA: NEGATIVE
Hgb urine dipstick: NEGATIVE
Ketones, ur: NEGATIVE
Leukocytes, UA: NEGATIVE
Nitrite: NEGATIVE
PROTEIN: NEGATIVE
SPECIFIC GRAVITY, URINE: 1.024 (ref 1.001–1.035)
pH: 5.5 (ref 5.0–8.0)

## 2016-04-05 LAB — VITAMIN D 25 HYDROXY (VIT D DEFICIENCY, FRACTURES): VIT D 25 HYDROXY: 59 ng/mL (ref 30–100)

## 2016-04-20 ENCOUNTER — Encounter: Payer: Self-pay | Admitting: *Deleted

## 2016-04-25 DIAGNOSIS — H10413 Chronic giant papillary conjunctivitis, bilateral: Secondary | ICD-10-CM | POA: Diagnosis not present

## 2016-04-25 DIAGNOSIS — H2513 Age-related nuclear cataract, bilateral: Secondary | ICD-10-CM | POA: Diagnosis not present

## 2016-04-25 DIAGNOSIS — H353131 Nonexudative age-related macular degeneration, bilateral, early dry stage: Secondary | ICD-10-CM | POA: Diagnosis not present

## 2016-04-25 DIAGNOSIS — H04123 Dry eye syndrome of bilateral lacrimal glands: Secondary | ICD-10-CM | POA: Diagnosis not present

## 2016-05-03 ENCOUNTER — Ambulatory Visit (INDEPENDENT_AMBULATORY_CARE_PROVIDER_SITE_OTHER): Payer: Medicare Other

## 2016-05-03 DIAGNOSIS — Z79899 Other long term (current) drug therapy: Secondary | ICD-10-CM

## 2016-05-03 LAB — BASIC METABOLIC PANEL
BUN: 18 mg/dL (ref 7–25)
CO2: 28 mmol/L (ref 20–31)
CREATININE: 1.04 mg/dL — AB (ref 0.50–0.99)
Calcium: 9.4 mg/dL (ref 8.6–10.4)
Chloride: 103 mmol/L (ref 98–110)
Glucose, Bld: 65 mg/dL (ref 65–99)
Potassium: 4.4 mmol/L (ref 3.5–5.3)
Sodium: 140 mmol/L (ref 135–146)

## 2016-05-03 NOTE — Progress Notes (Signed)
PT PRESENTS FOR LAB WORK FOR BMP WITHOUT QUESTIONS OR CONCERNS.

## 2016-06-09 ENCOUNTER — Ambulatory Visit (INDEPENDENT_AMBULATORY_CARE_PROVIDER_SITE_OTHER): Payer: Medicare Other | Admitting: Internal Medicine

## 2016-06-09 ENCOUNTER — Encounter: Payer: Self-pay | Admitting: Internal Medicine

## 2016-06-09 VITALS — BP 122/70 | HR 70 | Temp 98.2°F | Resp 14 | Ht 62.0 in | Wt 176.0 lb

## 2016-06-09 DIAGNOSIS — R6889 Other general symptoms and signs: Secondary | ICD-10-CM | POA: Diagnosis not present

## 2016-06-09 DIAGNOSIS — E785 Hyperlipidemia, unspecified: Secondary | ICD-10-CM

## 2016-06-09 DIAGNOSIS — Z79899 Other long term (current) drug therapy: Secondary | ICD-10-CM

## 2016-06-09 DIAGNOSIS — E559 Vitamin D deficiency, unspecified: Secondary | ICD-10-CM

## 2016-06-09 DIAGNOSIS — L718 Other rosacea: Secondary | ICD-10-CM | POA: Diagnosis not present

## 2016-06-09 DIAGNOSIS — Q667 Congenital pes cavus, unspecified foot: Secondary | ICD-10-CM

## 2016-06-09 DIAGNOSIS — R0989 Other specified symptoms and signs involving the circulatory and respiratory systems: Secondary | ICD-10-CM

## 2016-06-09 DIAGNOSIS — M150 Primary generalized (osteo)arthritis: Secondary | ICD-10-CM

## 2016-06-09 DIAGNOSIS — T7840XD Allergy, unspecified, subsequent encounter: Secondary | ICD-10-CM

## 2016-06-09 DIAGNOSIS — M1990 Unspecified osteoarthritis, unspecified site: Secondary | ICD-10-CM | POA: Insufficient documentation

## 2016-06-09 DIAGNOSIS — Z6832 Body mass index (BMI) 32.0-32.9, adult: Secondary | ICD-10-CM

## 2016-06-09 DIAGNOSIS — M8580 Other specified disorders of bone density and structure, unspecified site: Secondary | ICD-10-CM | POA: Diagnosis not present

## 2016-06-09 DIAGNOSIS — E6609 Other obesity due to excess calories: Secondary | ICD-10-CM

## 2016-06-09 DIAGNOSIS — Z0001 Encounter for general adult medical examination with abnormal findings: Secondary | ICD-10-CM

## 2016-06-09 DIAGNOSIS — K219 Gastro-esophageal reflux disease without esophagitis: Secondary | ICD-10-CM | POA: Diagnosis not present

## 2016-06-09 DIAGNOSIS — F325 Major depressive disorder, single episode, in full remission: Secondary | ICD-10-CM

## 2016-06-09 DIAGNOSIS — F988 Other specified behavioral and emotional disorders with onset usually occurring in childhood and adolescence: Secondary | ICD-10-CM

## 2016-06-09 DIAGNOSIS — I10 Essential (primary) hypertension: Secondary | ICD-10-CM | POA: Diagnosis not present

## 2016-06-09 DIAGNOSIS — Z0000 Encounter for general adult medical examination without abnormal findings: Secondary | ICD-10-CM

## 2016-06-09 DIAGNOSIS — M159 Polyosteoarthritis, unspecified: Secondary | ICD-10-CM

## 2016-06-09 DIAGNOSIS — B009 Herpesviral infection, unspecified: Secondary | ICD-10-CM

## 2016-06-09 DIAGNOSIS — D649 Anemia, unspecified: Secondary | ICD-10-CM

## 2016-06-09 MED ORDER — AMPHETAMINE-DEXTROAMPHETAMINE 30 MG PO TABS: 30.0000 mg | ORAL_TABLET | Freq: Two times a day (BID) | ORAL | 0 refills | Status: DC

## 2016-06-09 NOTE — Progress Notes (Signed)
MEDICARE ANNUAL WELLNESS VISIT AND FOLLOW UP Assessment:    1. Primary osteoarthritis involving multiple joints -followed by ortho -tylenol #3 as needed -heat and topical therapy as needed  2. Labile hypertension -well controlled -cont to monitor -dash diet -exercise as tolerated  3. Gastroesophageal reflux disease, esophagitis presence not specified -not currently on medications -OTC meds as needed -avoid trigger foods and NSAIDS  4. Ocular rosacea -followed by Opthalmology  5. Osteopenia, unspecified location -DEXA followed by Obgyn -cont Vit D and calcium  6. Allergic state, subsequent encounter -cont daily antihistamine as needed -avoid trigger allergen  7. Anemia, unspecified type -monitor CBC  8. Attention deficit disorder, unspecified hyperactivity presence -prescription given -last refill 03/22/16  9. Cavus deformity of foot -followed by ortho  10. Depression, major, in remission ([place]) -well controlled on wellbutrin 300  11. HSV infection -takes famciclovir as needed  12. Hyperlipidemia, unspecified hyperlipidemia type -cont diet and exercise  13. Medication management -monitor labs twice yearly -too soon for labs  14. Class 1 obesity due to excess calories with serious comorbidity and body mass index (BMI) of 32.0 to 32.9 in adult -cont diet and exercise  15. Vitamin D deficiency -cont Vit D supplement  16. Encounter for Medicare annual wellness exam -due next year       Over 30 minutes of exam, counseling, chart review, and critical decision making was performed  Future Appointments Date Time Provider [place]  10/04/2016 9:30 AM Vicie Mutters, PA-C [facility] None  04/05/2017 9:00 AM Vicie Mutters, PA-C [facility] None     Plan:   During the course of the visit the patient was educated and counseled about appropriate screening and preventive services including:    Pneumococcal vaccine   Influenza vaccine  Prevnar  13  Td vaccine  Screening electrocardiogram  Colorectal cancer screening  Diabetes screening  Glaucoma screening  Nutrition counseling    Subjective:  Kathy Cook is a 71 y.o. female who presents for Medicare Annual Wellness Visit and 3 month follow up for HTN, hyperlipidemia, and vitamin D Def.   Her blood pressure has been controlled at home, today their BP is BP: 122/70 She does not workout. She denies chest pain, shortness of breath, dizziness.  She is not on cholesterol medication and denies myalgias. Her cholesterol is not at goal. The cholesterol last visit was:   Lab Results  Component Value Date   CHOL 320 (H) 04/04/2016   HDL 79 04/04/2016   LDLCALC 203 (H) 04/04/2016   TRIG 188 (H) 04/04/2016   CHOLHDL 4.1 04/04/2016    Last GFR Lab Results  Component Value Date   GFRNONAA 57 (L) 04/04/2016     Lab Results  Component Value Date   GFRAA 66 04/04/2016   Patient is on Vitamin D supplement.   Lab Results  Component Value Date   VD25OH 59 04/04/2016      Medication Review: Current Outpatient Prescriptions on File Prior to Visit  Medication Sig Dispense Refill  . acetaminophen-codeine (TYLENOL #3) 300-30 MG tablet Take 1-2 tablets by mouth every 8 (eight) hours as needed for moderate pain. 60 tablet 0  . amphetamine-dextroamphetamine (ADDERALL) 30 MG tablet Take 1 tablet by mouth 2 (two) times daily. 60 tablet 0  . buPROPion (WELLBUTRIN XL) 300 MG 24 hr tablet Take 1 tablet (300 mg total) by mouth every morning. 30 tablet 2  . Cholecalciferol (VITAMIN D3) 5000 UNITS TABS Take 10,000 Units by mouth.    . Estradiol-Estriol-Progesterone (BIEST/PROGESTERONE) CREA  Place onto the skin. Marland Kitchen05 mg/ml cream to apply daily    . famciclovir (FAMVIR) 500 MG tablet Take 1 tablet (500 mg total) by mouth 2 (two) times daily. 60 tablet 0  . Omega-3 Fatty Acids (FISH OIL) 1000 MG CAPS Take by mouth daily.    . Progesterone Micronized 10 % CREA Place onto the skin. Patient  applies 2 % cream daily     No current facility-administered medications on file prior to visit.     Allergies: Allergies  Allergen Reactions  . Prednisone   . Statins     paralyzing  . Strattera [Atomoxetine Hcl]     Increased sadness    Current Problems (verified) has Attention deficit disorder (ADD); GERD (gastroesophageal reflux disease); Allergy; Depression, major, in remission ([place]); Anemia; Ocular rosacea; HSV infection; Osteopenia; Lumbar degenerative disc disease; Hyperlipemia; Obesity; Cavus deformity of foot; Arthritis, midfoot; Labile hypertension; Abnormal glucose; Vitamin D deficiency; Medication management; and Encounter for Medicare annual wellness exam on her problem list.  Screening Tests Immunization History  Administered Date(s) Administered  . DT 01/14/2015  . Pneumococcal Conjugate-13 03/24/2015  . Pneumococcal-Unspecified 12/31/2012  . Td 03/29/2004    Preventative care: Last colonoscopy: 2006 Mammogram: 2018 DEXA:  Handled by Dr. Runell Gess at Obgyn  Declines shingles vaccine due to cost  Names of Other Physician/Practitioners you currently use: 1. [facility] here for primary care 2. Dr. Katy Fitch and Earl Gala, eye doctor, last visit 2018 3. Dr. Alfredo Martinez , dentist, last visit 2018 Patient Care Team: Unk Pinto, MD as PCP - General (Internal Medicine) Dian Queen, MD as Consulting Physician (Obstetrics and Gynecology) Clent Jacks, MD as Consulting Physician (Ophthalmology) Lindwood Coke, MD as Consulting Physician (Dermatology)  Surgical: She  has a past surgical history that includes Tonsillectomy and adenoidectomy and Skin biopsy. Family Her family history includes Depression in her mother; Diabetes in her father; Hypertension in her mother; Macular degeneration in her mother; Stroke in her father. Social history  She reports that she quit smoking about 36 years ago. Her smoking use included  Cigarettes. She has a 7.50 pack-year smoking history. She has never used smokeless tobacco. She reports that she drinks alcohol. She reports that she does not use drugs.  MEDICARE WELLNESS OBJECTIVES: Physical activity: Current Exercise Habits: The patient does not participate in regular exercise at present, Exercise limited by: orthopedic condition(s) Cardiac risk factors: Cardiac Risk Factors include: advanced age (>71men, >63 women);dyslipidemia;hypertension;obesity (BMI >30kg/m2);sedentary lifestyle Depression/mood screen:   Depression screen [facility] 2/9 06/09/2016  Decreased Interest 0  Down, Depressed, Hopeless 0  PHQ - 2 Score 0    ADLs:  In your present state of health, do you have any difficulty performing the following activities: 06/09/2016  Hearing? N  Vision? N  Difficulty concentrating or making decisions? N  Walking or climbing stairs? N  Dressing or bathing? N  Doing errands, shopping? N  Preparing Food and eating ? N  Using the Toilet? N  In the past six months, have you accidently leaked urine? N  Do you have problems with loss of bowel control? N  Managing your Medications? N  Managing your Finances? N  Housekeeping or managing your Housekeeping? N  Some recent data might be hidden     Cognitive Testing  Alert? Yes  Normal Appearance?Yes  Oriented to person? Yes  Place? Yes   Time? Yes  Recall of three objects?  Yes  Can perform simple calculations? Yes  Displays appropriate judgment?Yes  Can  read the correct time from a watch face?Yes  EOL planning: Does Patient Have a Medical Advance Directive?: Yes Type of Advance Directive: Healthcare Power of Attorney, Living will Does patient want to make changes to medical advance directive?: Yes (MAU/Ambulatory/Procedural Areas - Information given) Copy of [place] in Chart?: No - copy requested   Objective:   Today's Vitals   06/09/16 1110  BP: 122/70  Pulse: 70  Resp: 14  Temp: 98.2 F  (36.8 C)  TempSrc: Temporal  Weight: 176 lb (79.8 kg)  Height: 5\' 2"  (1.575 m)   Body mass index is 32.19 kg/m.  General appearance: alert, no distress, WD/WN, female HEENT: normocephalic, sclerae anicteric, TMs pearly, nares patent, no discharge or erythema, pharynx normal Oral cavity: MMM, no lesions Neck: supple, no lymphadenopathy, no thyromegaly, no masses Heart: RRR, normal S1, S2, no murmurs Lungs: CTA bilaterally, no wheezes, rhonchi, or rales Abdomen: +bs, soft, non tender, non distended, no masses, no hepatomegaly, no splenomegaly Musculoskeletal: nontender, no swelling, no obvious deformity Extremities: no edema, no cyanosis, no clubbing Pulses: 2+ symmetric, upper and lower extremities, normal cap refill Neurological: alert, oriented x 3, CN2-12 intact, strength normal upper extremities and lower extremities, sensation normal throughout, DTRs 2+ throughout, no cerebellar signs, gait normal Psychiatric: normal affect, behavior normal, pleasant   Medicare Attestation I have personally reviewed: The patient's medical and social history Their use of alcohol, tobacco or illicit drugs Their current medications and supplements The patient's functional ability including ADLs,fall risks, home safety risks, cognitive, and hearing and visual impairment Diet and physical activities Evidence for depression or mood disorders  The patient's weight, height, BMI, and visual acuity have been recorded in the chart.  I have made referrals, counseling, and provided education to the patient based on review of the above and I have provided the patient with a written personalized care plan for preventive services.     Kathy Skeans, PA-C   06/09/2016

## 2016-07-06 ENCOUNTER — Other Ambulatory Visit: Payer: Self-pay | Admitting: *Deleted

## 2016-07-06 MED ORDER — BUPROPION HCL ER (XL) 300 MG PO TB24: 300.0000 mg | ORAL_TABLET | ORAL | 2 refills | Status: DC

## 2016-10-03 ENCOUNTER — Ambulatory Visit (INDEPENDENT_AMBULATORY_CARE_PROVIDER_SITE_OTHER): Payer: Medicare Other

## 2016-10-03 ENCOUNTER — Ambulatory Visit (INDEPENDENT_AMBULATORY_CARE_PROVIDER_SITE_OTHER): Payer: Medicare Other | Admitting: Orthopedic Surgery

## 2016-10-03 ENCOUNTER — Encounter (INDEPENDENT_AMBULATORY_CARE_PROVIDER_SITE_OTHER): Payer: Self-pay | Admitting: Family

## 2016-10-03 VITALS — Ht 62.0 in | Wt 176.0 lb

## 2016-10-03 DIAGNOSIS — M84374A Stress fracture, right foot, initial encounter for fracture: Secondary | ICD-10-CM

## 2016-10-03 DIAGNOSIS — M79672 Pain in left foot: Secondary | ICD-10-CM | POA: Diagnosis not present

## 2016-10-03 NOTE — Progress Notes (Addendum)
Assessment and Plan:  HTN, labile - continue medications, DASH diet, exercise and monitor at home. Call if greater than 130/80.  -     CBC with Differential/Platelet -     BASIC METABOLIC PANEL WITH GFR -     Hepatic function panel -     TSH  Hyperlipemia -continue medications, check lipids, decrease fatty foods, increase activity.  -     Lipid panel  Abnormal glucose Discussed general issues about diabetes pathophysiology and management., Educational material distributed., Suggested low cholesterol diet., Encouraged aerobic exercise., Discussed foot care., Reminded to get yearly retinal exam. -     Hemoglobin A1c  Morbid Obesity with co morbidities - long discussion about weight loss, diet, and exercise  Vitamin D deficiency -     VITAMIN D 25 Hydroxy (Vit-D Deficiency, Fractures)  Medication management -     Magnesium  Attention deficit disorder (ADD) -  Continue ADD medication, helps with focus, no AE's. The patient was counseled on the addictive nature of the medication and was encouraged to take drug holidays when not needed. -     lisdexamfetamine (VYVANSE) 30 MG capsule; Take 1 capsule (30 mg total) by mouth daily.  Depression, major, in remission ([place]) - Will add on medication, patient will find genetic testing and will send in something for her - will send in celexa 20mg  to day, decrease wellbutrin to 150 - continue medications, stress management techniques discussed, increase water, good sleep hygiene discussed, increase exercise, and increase veggies.    Continue diet and meds as discussed. Further disposition pending results of labs.  HPI 71 y.o. female  presents for 6 month follow up with hypertension, hyperlipidemia, prediabetes and vitamin D. Her blood pressure has been controlled at home, today their BP is BP: 130/80 She does not workout currently but wants to get back to it.. She denies chest pain, shortness of breath, dizziness. She currently has soft boot  left foot, following Dr. Sharol Given for lisfranc fracture.  She is not on cholesterol medication and denies myalgias. Her cholesterol is at goal. The cholesterol last visit was:   Lab Results  Component Value Date   CHOL 320 (H) 04/04/2016   HDL 79 04/04/2016   LDLCALC 203 (H) 04/04/2016   TRIG 188 (H) 04/04/2016   CHOLHDL 4.1 04/04/2016    Last A1C in the office was:  Lab Results  Component Value Date   HGBA1C 5.5 09/24/2015   Patient is on Vitamin D supplement.   Lab Results  Component Value Date   VD25OH 76 04/04/2016     Patient is on an ADD medication, she states that the medication is helping and she denies any adverse reactions.   Sleep very well at night. Taking care of mother, in assisted living, is very stressed.  BMI is There is no height or weight on file to calculate BMI., she is working on diet and exercise.  REFUSED WEIGHT DUE TO BOOT Wt Readings from Last 3 Encounters:  10/03/16 176 lb (79.8 kg)  06/09/16 176 lb (79.8 kg)  04/04/16 177 lb 6.4 oz (80.5 kg)    Current Medications:  Current Outpatient Prescriptions on File Prior to Visit  Medication Sig Dispense Refill  . acetaminophen-codeine (TYLENOL #3) 300-30 MG tablet Take 1-2 tablets by mouth every 8 (eight) hours as needed for moderate pain. 60 tablet 0  . amphetamine-dextroamphetamine (ADDERALL) 30 MG tablet Take 1 tablet by mouth 2 (two) times daily. 60 tablet 0  . buPROPion (WELLBUTRIN XL) 300  MG 24 hr tablet Take 1 tablet (300 mg total) by mouth every morning. 30 tablet 2  . Cholecalciferol (VITAMIN D3) 5000 UNITS TABS Take 10,000 Units by mouth.    . Estradiol-Estriol-Progesterone (BIEST/PROGESTERONE) CREA Place onto the skin. Marland Kitchen05 mg/ml cream to apply daily    . famciclovir (FAMVIR) 500 MG tablet Take 1 tablet (500 mg total) by mouth 2 (two) times daily. 60 tablet 0  . Omega-3 Fatty Acids (FISH OIL) 1000 MG CAPS Take by mouth daily.    . Progesterone Micronized 10 % CREA Place onto the skin. Patient applies 2  % cream daily     No current facility-administered medications on file prior to visit.    Medical History:  Past Medical History:  Diagnosis Date  . Allergy   . Anemia   . Anxiety   . Attention deficit disorder (ADD)   . Cancer [facility])    ? basal/squam cell on chest  . Depression   . Dry eyes   . GERD (gastroesophageal reflux disease)   . HSV infection   . Lumbar degenerative disc disease   . Ocular rosacea   . Osteopenia    Allergies:  Allergies  Allergen Reactions  . Prednisone   . Statins     paralyzing  . Strattera [Atomoxetine Hcl]     Increased sadness    Review of Systems  Constitutional: Positive for malaise/fatigue.  HENT: Negative.   Eyes: Negative.   Respiratory: Negative.   Cardiovascular: Negative.   Gastrointestinal: Negative.   Genitourinary: Negative.   Musculoskeletal: Negative.   Skin: Negative.   Neurological: Negative.   Endo/Heme/Allergies: Negative.   Psychiatric/Behavioral: Negative.     Family history- Review and unchanged Social history- Review and unchanged Physical Exam: BP 130/80   Pulse 62   Temp 97.7 F (36.5 C)   Resp 16   Ht 5\' 2"  (1.575 m)   SpO2 98%  Wt Readings from Last 3 Encounters:  10/03/16 176 lb (79.8 kg)  06/09/16 176 lb (79.8 kg)  04/04/16 177 lb 6.4 oz (80.5 kg)   General Appearance: Well nourished, in no apparent distress. Eyes: PERRLA, EOMs, conjunctiva no swelling or erythema Sinuses: No Frontal/maxillary tenderness ENT/Mouth: Ext aud canals clear, TMs without erythema, bulging. No erythema, swelling, or exudate on post pharynx.  Tonsils not swollen or erythematous. Hearing normal.  Neck: Supple, thyroid normal.  Respiratory: Respiratory effort normal, BS equal bilaterally without rales, rhonchi, wheezing or stridor.  Cardio: RRR with no MRGs, prominent S2. Brisk peripheral pulses without edema.  Abdomen: Soft, + BS.  Non tender, no guarding, rebound, hernias, masses. Lymphatics: Non tender without  lymphadenopathy.  Musculoskeletal: Full ROM, 5/5 strength, left leg in soft boot Skin: Warm, dry without rashes, lesions, ecchymosis.  Neuro: Cranial nerves intact. Normal muscle tone, no cerebellar symptoms. Sensation intact.  Psych: Awake and oriented X 3, normal affect, Insight and Judgment appropriate.    Vicie Mutters, PA-C 9:56 AM [facility]

## 2016-10-03 NOTE — Progress Notes (Signed)
Office Visit Note   Patient: Kathy Cook           Date of Birth: 1946/02/16           MRN: [id number] Visit Date: 10/03/2016              Requested by: Unk Pinto, [place] [address], [place] [address] PCP: Unk Pinto, MD  Chief Complaint  Patient presents with  . Left Foot - Pain      HPI: Patient is a 71 year old woman who was in a wedding in [place] when she sustained a injury to her left foot. Patient continued to ambulate on her foot and presents at this time for initial evaluation.  Assessment & Plan: Visit Diagnoses:  1. Pain in left foot   2. Metatarsal stress fracture, right, initial encounter     Plan: We will place her in a fracture boot weightbearing as tolerated. Discussed that if weightbearing is painful in the boot that she can get a kneeling scooter or a walker to unload the foot.  Three-view radiographs of the left foot at follow-up.  Patient is currently taking 5000 international units of vitamin D 3 a day.  Follow-Up Instructions: Return in about 3 weeks (around 10/24/2016).   Ortho Exam  Patient is alert, oriented, no adenopathy, well-dressed, normal affect, normal respiratory effort. Examination patient has a good pulse there is no venous stasis swelling the Lisfranc complex is nontender to palpation. Patient has ecchymosis and bruising into the toes as well as the lateral aspect of her foot. The fifth metatarsal is tender to palpation.  Imaging: Xr Foot Complete Left  Result Date: 10/03/2016 Three-view radiographs of the left foot shows a congruent Lisfranc complex no widening. Patient does have a comminuted fracture of the fifth metatarsal shaft left foot   Labs: Lab Results  Component Value Date   HGBA1C 5.5 09/24/2015   HGBA1C 5.7 (H) 01/14/2015   HGBA1C 5.7 (H) 08/01/2014    Orders:  Orders Placed This Encounter  Procedures  . XR Foot Complete Left   No orders of the defined types  were placed in this encounter.    Procedures: No procedures performed  Clinical Data: No additional findings.  ROS:  All other systems negative, except as noted in the HPI. Review of Systems  Objective: Vital Signs: Ht 5\' 2"  (1.575 m)   Wt 176 lb (79.8 kg)   BMI 32.19 kg/m   Specialty Comments:  No specialty comments available.  PMFS History: Patient Active Problem List   Diagnosis Date Noted  . Metatarsal stress fracture, right, initial encounter 10/03/2016  . DJD (degenerative joint disease) 06/09/2016  . Encounter for Medicare annual wellness exam 01/14/2015  . Labile hypertension 08/01/2014  . Vitamin D deficiency 08/01/2014  . Medication management 08/01/2014  . Obesity 07/04/2014  . Cavus deformity of foot 07/04/2014  . Hyperlipemia 06/27/2013  . Attention deficit disorder (ADD)   . GERD (gastroesophageal reflux disease)   . Allergy   . Depression, major, in remission ([place])   . Anemia   . Ocular rosacea   . HSV infection   . Osteopenia    Past Medical History:  Diagnosis Date  . Allergy   . Anemia   . Anxiety   . Attention deficit disorder (ADD)   . Cancer [facility])    ? basal/squam cell on chest  . Depression   . Dry eyes   . GERD (gastroesophageal reflux disease)   .  HSV infection   . Lumbar degenerative disc disease   . Ocular rosacea   . Osteopenia     Family History  Problem Relation Age of Onset  . Hypertension Mother   . Depression Mother   . Macular degeneration Mother   . Stroke Father   . Diabetes Father     Past Surgical History:  Procedure Laterality Date  . SKIN BIOPSY     ?basal/squam cell on chest  . TONSILLECTOMY AND ADENOIDECTOMY     Social History   Occupational History  . Not on file.   Social History Main Topics  . Smoking status: Former Smoker    Packs/day: 0.50    Years: 15.00    Types: Cigarettes    Quit date: 03/23/1980  . Smokeless tobacco: Never Used  . Alcohol use 0.0 oz/week     Comment: social  .  Drug use: No  . Sexual activity: Yes

## 2016-10-04 ENCOUNTER — Ambulatory Visit (INDEPENDENT_AMBULATORY_CARE_PROVIDER_SITE_OTHER): Payer: Medicare Other | Admitting: Physician Assistant

## 2016-10-04 ENCOUNTER — Encounter: Payer: Self-pay | Admitting: Physician Assistant

## 2016-10-04 VITALS — BP 130/80 | HR 62 | Temp 97.7°F | Resp 16 | Ht 62.0 in

## 2016-10-04 DIAGNOSIS — Z79899 Other long term (current) drug therapy: Secondary | ICD-10-CM | POA: Diagnosis not present

## 2016-10-04 DIAGNOSIS — Z6832 Body mass index (BMI) 32.0-32.9, adult: Secondary | ICD-10-CM

## 2016-10-04 DIAGNOSIS — R0989 Other specified symptoms and signs involving the circulatory and respiratory systems: Secondary | ICD-10-CM | POA: Diagnosis not present

## 2016-10-04 DIAGNOSIS — L718 Other rosacea: Secondary | ICD-10-CM | POA: Diagnosis not present

## 2016-10-04 DIAGNOSIS — F325 Major depressive disorder, single episode, in full remission: Secondary | ICD-10-CM

## 2016-10-04 DIAGNOSIS — E6609 Other obesity due to excess calories: Secondary | ICD-10-CM

## 2016-10-04 DIAGNOSIS — E782 Mixed hyperlipidemia: Secondary | ICD-10-CM | POA: Diagnosis not present

## 2016-10-04 LAB — BASIC METABOLIC PANEL WITH GFR
BUN: 18 mg/dL (ref 7–25)
CHLORIDE: 104 mmol/L (ref 98–110)
CO2: 29 mmol/L (ref 20–32)
Calcium: 9.4 mg/dL (ref 8.6–10.4)
Creat: 0.91 mg/dL (ref 0.50–0.99)
GFR, EST NON AFRICAN AMERICAN: 65 mL/min (ref >=60)
GFR, Est African American: 75 mL/min (ref >=60)
Glucose, Bld: 71 mg/dL (ref 65–99)
POTASSIUM: 4.6 mmol/L (ref 3.5–5.3)
SODIUM: 142 mmol/L (ref 135–146)

## 2016-10-04 LAB — LIPID PANEL
CHOL/HDL RATIO: 3.1 ratio (ref <5.0)
Cholesterol: 288 mg/dL — ABNORMAL HIGH (ref <200)
HDL: 93 mg/dL (ref >50)
LDL Cholesterol: 149 mg/dL — ABNORMAL HIGH (ref <100)
Triglycerides: 229 mg/dL — ABNORMAL HIGH (ref <150)
VLDL: 46 mg/dL — AB (ref <30)

## 2016-10-04 LAB — HEPATIC FUNCTION PANEL
ALK PHOS: 57 U/L (ref 33–130)
ALT: 13 U/L (ref 6–29)
AST: 14 U/L (ref 10–35)
Albumin: 4 g/dL (ref 3.6–5.1)
BILIRUBIN DIRECT: 0.1 mg/dL (ref <=0.2)
BILIRUBIN TOTAL: 0.4 mg/dL (ref 0.2–1.2)
Indirect Bilirubin: 0.3 mg/dL (ref 0.2–1.2)
Total Protein: 6.3 g/dL (ref 6.1–8.1)

## 2016-10-04 LAB — CBC WITH DIFFERENTIAL/PLATELET
Basophils Absolute: 0 cells/uL (ref 0–200)
Basophils Relative: 0 %
EOS PCT: 4 %
Eosinophils Absolute: 216 cells/uL (ref 15–500)
HEMATOCRIT: 40.9 % (ref 35.0–45.0)
HEMOGLOBIN: 13.5 g/dL (ref 11.7–15.5)
LYMPHS ABS: 1350 {cells}/uL (ref 850–3900)
Lymphocytes Relative: 25 %
MCH: 32.3 pg (ref 27.0–33.0)
MCHC: 33 g/dL (ref 32.0–36.0)
MCV: 97.8 fL (ref 80.0–100.0)
MPV: 10.4 fL (ref 7.5–12.5)
Monocytes Absolute: 486 cells/uL (ref 200–950)
Monocytes Relative: 9 %
NEUTROS PCT: 62 %
Neutro Abs: 3348 cells/uL (ref 1500–7800)
Platelets: 231 10*3/uL (ref 140–400)
RBC: 4.18 MIL/uL (ref 3.80–5.10)
RDW: 13 % (ref 11.0–15.0)
WBC: 5.4 10*3/uL (ref 3.8–10.8)

## 2016-10-04 LAB — TSH: TSH: 1.99 m[IU]/L

## 2016-10-04 MED ORDER — AMPHETAMINE-DEXTROAMPHETAMINE 30 MG PO TABS: 30.0000 mg | ORAL_TABLET | Freq: Two times a day (BID) | ORAL | 0 refills | Status: DC

## 2016-10-04 MED ORDER — EZETIMIBE 10 MG PO TABS: 10.0000 mg | ORAL_TABLET | Freq: Every day | ORAL | 3 refills | Status: DC

## 2016-10-04 NOTE — Patient Instructions (Signed)
Cut the wellbutrin in half, will send in medication for your to start Suggest counseling Start zetia  Add ENTERIC COATED low dose 81 mg Aspirin daily OR can do every other day if you have easy bruising to protect your heart and head. As well as to reduce risk of Colon Cancer by 20 %, Skin Cancer by 26 % , Melanoma by 46% and Pancreatic cancer by 60%    Bad carbs also include fruit juice, alcohol, and sweet tea. These are empty calories that do not signal to your brain that you are full.   Please remember the good carbs are still carbs which convert into sugar. So please measure them out no more than 1/2-1 cup of rice, oatmeal, pasta, and beans  Veggies are however free foods! Pile them on.   Not all fruit is created equal. Please see the list below, the fruit at the bottom is higher in sugars than the fruit at the top. Please avoid all dried fruits.

## 2016-10-05 LAB — MAGNESIUM: MAGNESIUM: 2 mg/dL (ref 1.5–2.5)

## 2016-10-06 ENCOUNTER — Encounter: Payer: Self-pay | Admitting: Physician Assistant

## 2016-10-06 MED ORDER — BUPROPION HCL ER (XL) 150 MG PO TB24: 150.0000 mg | ORAL_TABLET | ORAL | 2 refills | Status: DC

## 2016-10-06 MED ORDER — CITALOPRAM HYDROBROMIDE 20 MG PO TABS: 20.0000 mg | ORAL_TABLET | Freq: Every day | ORAL | 2 refills | Status: DC

## 2016-10-06 NOTE — Addendum Note (Signed)
Addended by: Vicie Mutters R on: 10/06/2016 02:29 PM   Modules accepted: Orders

## 2016-10-24 ENCOUNTER — Ambulatory Visit (INDEPENDENT_AMBULATORY_CARE_PROVIDER_SITE_OTHER): Payer: Medicare Other | Admitting: Orthopedic Surgery

## 2016-10-24 ENCOUNTER — Encounter (INDEPENDENT_AMBULATORY_CARE_PROVIDER_SITE_OTHER): Payer: Self-pay | Admitting: Orthopedic Surgery

## 2016-10-24 ENCOUNTER — Ambulatory Visit (INDEPENDENT_AMBULATORY_CARE_PROVIDER_SITE_OTHER): Payer: Medicare Other

## 2016-10-24 DIAGNOSIS — M84375D Stress fracture, left foot, subsequent encounter for fracture with routine healing: Secondary | ICD-10-CM | POA: Diagnosis not present

## 2016-10-24 NOTE — Progress Notes (Signed)
Office Visit Note   Patient: Kathy Cook           Date of Birth: [birth year]           MRN: [id number] Visit Date: 10/24/2016              Requested by: Unk Pinto, [address] [place], [place] [address] PCP: Unk Pinto, MD  Chief Complaint  Patient presents with  . Left Leg - Injury      HPI: Patient presents 3 weeks status post left foot comminuted fracture of the shaft of the fifth metatarsal. Patient has been elevating her foot minimal weightbearing.  Assessment & Plan: Visit Diagnoses:  1. Stress fracture of metatarsal bone of left foot with routine healing, subsequent encounter     Plan: Continue with the fracture boot repeat 3 view radiographs of the left foot at follow-up advance to a stiff soled sneaker at follow-up.  Follow-Up Instructions: Return in about 3 weeks (around 11/14/2016).   Ortho Exam  Patient is alert, oriented, no adenopathy, well-dressed, normal affect, normal respiratory effort. Examination the left foot there is no swelling there is good wrinkling of the skin her foot is plantar grade there is no skin breakdown.  Imaging: Xr Foot Complete Left  Result Date: 10/24/2016 Three-view radiographs of the left foot shows early callus formation at the fifth metatarsal fracture site. There is no varus valgus or hyperextension deformity there is shortening of the fifth metatarsal  No images are attached to the encounter.  Labs: Lab Results  Component Value Date   HGBA1C 5.5 09/24/2015   HGBA1C 5.7 (H) 01/14/2015   HGBA1C 5.7 (H) 08/01/2014    Orders:  Orders Placed This Encounter  Procedures  . XR Foot Complete Left   No orders of the defined types were placed in this encounter.    Procedures: No procedures performed  Clinical Data: No additional findings.  ROS:  All other systems negative, except as noted in the HPI. Review of Systems  Objective: Vital Signs: There were no vitals taken for  this visit.  Specialty Comments:  No specialty comments available.  PMFS History: Patient Active Problem List   Diagnosis Date Noted  . Metatarsal stress fracture, right, initial encounter 10/03/2016  . DJD (degenerative joint disease) 06/09/2016  . Encounter for Medicare annual wellness exam 01/14/2015  . Labile hypertension 08/01/2014  . Vitamin D deficiency 08/01/2014  . Medication management 08/01/2014  . Obesity 07/04/2014  . Cavus deformity of foot 07/04/2014  . Hyperlipemia 06/27/2013  . Attention deficit disorder (ADD)   . GERD (gastroesophageal reflux disease)   . Allergy   . Depression, major, in remission ([place])   . Anemia   . Ocular rosacea   . HSV infection   . Osteopenia    Past Medical History:  Diagnosis Date  . Allergy   . Anemia   . Anxiety   . Attention deficit disorder (ADD)   . Cancer [facility])    ? basal/squam cell on chest  . Depression   . Dry eyes   . GERD (gastroesophageal reflux disease)   . HSV infection   . Lumbar degenerative disc disease   . Ocular rosacea   . Osteopenia     Family History  Problem Relation Age of Onset  . Hypertension Mother   . Depression Mother   . Macular degeneration Mother   . Stroke Father   . Diabetes Father     Past Surgical History:  Procedure Laterality Date  . SKIN BIOPSY     ?basal/squam cell on chest  . TONSILLECTOMY AND ADENOIDECTOMY     Social History   Occupational History  . Not on file.   Social History Main Topics  . Smoking status: Former Smoker    Packs/day: 0.50    Years: 15.00    Types: Cigarettes    Quit date: 03/23/1980  . Smokeless tobacco: Never Used  . Alcohol use 0.0 oz/week     Comment: social  . Drug use: No  . Sexual activity: Yes

## 2016-11-14 ENCOUNTER — Ambulatory Visit (INDEPENDENT_AMBULATORY_CARE_PROVIDER_SITE_OTHER): Payer: Medicare Other

## 2016-11-14 ENCOUNTER — Encounter: Payer: Self-pay | Admitting: Internal Medicine

## 2016-11-14 ENCOUNTER — Ambulatory Visit (INDEPENDENT_AMBULATORY_CARE_PROVIDER_SITE_OTHER): Payer: Medicare Other | Admitting: Orthopedic Surgery

## 2016-11-14 DIAGNOSIS — M79672 Pain in left foot: Secondary | ICD-10-CM | POA: Diagnosis not present

## 2016-11-14 DIAGNOSIS — M84375D Stress fracture, left foot, subsequent encounter for fracture with routine healing: Secondary | ICD-10-CM

## 2016-11-14 NOTE — Progress Notes (Signed)
Office Visit Note   Patient: Kathy Cook           Date of Birth: February 29, 1946           MRN: [id number] Visit Date: 11/14/2016              Requested by: Unk Pinto, [place] [address], [place] [address] PCP: Unk Pinto, MD  Chief Complaint  Patient presents with  . Left Foot - Follow-up      HPI: Patient is a 71 year old woman who presents 6 weeks status post fracture through the fifth metatarsal left foot. Patient has been a fracture boot has been asymptomatic states that she is walked a few times without the boot on without symptoms.  Assessment & Plan: Visit Diagnoses:  1. Pain in left foot   2. Stress fracture of metatarsal bone of left foot with routine healing, subsequent encounter     Plan: Plan to advance to regular shoe wear as she feels comfortable follow-up in 4 weeks with repeat radiographs of the left foot.  Follow-Up Instructions: Return in about 4 weeks (around 12/12/2016).   Ortho Exam  Patient is alert, oriented, no adenopathy, well-dressed, normal affect, normal respiratory effort. Examination patient's of foot is nontender to palpation. She has good ankle good subtalar motion. There is no skin breakdown and no callus no ulcers. Patient does have an antalgic gait.  Imaging: Xr Foot Complete Left  Result Date: 11/14/2016 Three-view radiographs of the left foot shows interval healing of the fracture without any complicating features.  No images are attached to the encounter.  Labs: Lab Results  Component Value Date   HGBA1C 5.5 09/24/2015   HGBA1C 5.7 (H) 01/14/2015   HGBA1C 5.7 (H) 08/01/2014    Orders:  Orders Placed This Encounter  Procedures  . XR Foot Complete Left   No orders of the defined types were placed in this encounter.    Procedures: No procedures performed  Clinical Data: No additional findings.  ROS:  All other systems negative, except as noted in the HPI. Review of  Systems  Objective: Vital Signs: There were no vitals taken for this visit.  Specialty Comments:  No specialty comments available.  PMFS History: Patient Active Problem List   Diagnosis Date Noted  . Metatarsal stress fracture, right, initial encounter 10/03/2016  . DJD (degenerative joint disease) 06/09/2016  . Encounter for Medicare annual wellness exam 01/14/2015  . Labile hypertension 08/01/2014  . Vitamin D deficiency 08/01/2014  . Medication management 08/01/2014  . Obesity 07/04/2014  . Cavus deformity of foot 07/04/2014  . Hyperlipemia 06/27/2013  . Attention deficit disorder (ADD)   . GERD (gastroesophageal reflux disease)   . Allergy   . Depression, major, in remission ([place])   . Anemia   . Ocular rosacea   . HSV infection   . Osteopenia    Past Medical History:  Diagnosis Date  . Allergy   . Anemia   . Anxiety   . Attention deficit disorder (ADD)   . Cancer [facility])    ? basal/squam cell on chest  . Depression   . Dry eyes   . GERD (gastroesophageal reflux disease)   . HSV infection   . Lumbar degenerative disc disease   . Ocular rosacea   . Osteopenia     Family History  Problem Relation Age of Onset  . Hypertension Mother   . Depression Mother   . Macular degeneration Mother   . Stroke Father   .  Diabetes Father     Past Surgical History:  Procedure Laterality Date  . SKIN BIOPSY     ?basal/squam cell on chest  . TONSILLECTOMY AND ADENOIDECTOMY     Social History   Occupational History  . Not on file.   Social History Main Topics  . Smoking status: Former Smoker    Packs/day: 0.50    Years: 15.00    Types: Cigarettes    Quit date: 03/23/1980  . Smokeless tobacco: Never Used  . Alcohol use 0.0 oz/week     Comment: social  . Drug use: No  . Sexual activity: Yes

## 2016-12-12 ENCOUNTER — Ambulatory Visit (INDEPENDENT_AMBULATORY_CARE_PROVIDER_SITE_OTHER): Payer: Medicare Other | Admitting: Orthopedic Surgery

## 2016-12-12 ENCOUNTER — Ambulatory Visit (INDEPENDENT_AMBULATORY_CARE_PROVIDER_SITE_OTHER): Payer: Medicare Other

## 2016-12-12 DIAGNOSIS — M84375D Stress fracture, left foot, subsequent encounter for fracture with routine healing: Secondary | ICD-10-CM

## 2016-12-12 DIAGNOSIS — M79672 Pain in left foot: Secondary | ICD-10-CM

## 2016-12-12 NOTE — Progress Notes (Signed)
Office Visit Note   Patient: Kathy Cook           Date of Birth: 1945/12/08           MRN: [id number] Visit Date: 12/12/2016              Requested by: Unk Pinto, MD [address] [place], [place] [address] PCP: Unk Pinto, MD  No chief complaint on file.     HPI: Patient presents follow-up status post spiral fracture metatarsal shaft left foot fifth metatarsal  Assessment & Plan: Visit Diagnoses:  1. Pain in left foot   2. Stress fracture of metatarsal bone of left foot with routine healing, subsequent encounter     Plan: she will advance to [facility] sneakers. Continue heel cord stretching weightbearing as tolerated increase her activities as tolerated.  Follow-Up Instructions: Return if symptoms worsen or fail to improve.   Ortho Exam  Patient is alert, oriented, no adenopathy, well-dressed, normal affect, normal respiratory effort. Examination there is no swelling in the foot no ecchymosis or bruising no ulcers no callus. She has good range of motion of the ankle. She has good pulses. The fracture site is nontender to palpation.  Imaging: Xr Foot Complete Left  Result Date: 12/12/2016 Three-view radiographs of the left foot shows good callus formation at the fracture site fifth metatarsal left foot.  No images are attached to the encounter.  Labs: Lab Results  Component Value Date   HGBA1C 5.5 09/24/2015   HGBA1C 5.7 (H) 01/14/2015   HGBA1C 5.7 (H) 08/01/2014    Orders:  Orders Placed This Encounter  Procedures  . XR Foot Complete Left   No orders of the defined types were placed in this encounter.    Procedures: No procedures performed  Clinical Data: No additional findings.  ROS:  All other systems negative, except as noted in the HPI. Review of Systems  Objective: Vital Signs: There were no vitals taken for this visit.  Specialty Comments:  No specialty comments available.  PMFS History: Patient Active  Problem List   Diagnosis Date Noted  . Metatarsal stress fracture, right, initial encounter 10/03/2016  . DJD (degenerative joint disease) 06/09/2016  . Encounter for Medicare annual wellness exam 01/14/2015  . Labile hypertension 08/01/2014  . Vitamin D deficiency 08/01/2014  . Medication management 08/01/2014  . Obesity 07/04/2014  . Cavus deformity of foot 07/04/2014  . Hyperlipemia 06/27/2013  . Attention deficit disorder (ADD)   . GERD (gastroesophageal reflux disease)   . Allergy   . Depression, major, in remission ([place])   . Anemia   . Ocular rosacea   . HSV infection   . Osteopenia    Past Medical History:  Diagnosis Date  . Allergy   . Anemia   . Anxiety   . Attention deficit disorder (ADD)   . Cancer [facility])    ? basal/squam cell on chest  . Depression   . Dry eyes   . GERD (gastroesophageal reflux disease)   . HSV infection   . Lumbar degenerative disc disease   . Ocular rosacea   . Osteopenia     Family History  Problem Relation Age of Onset  . Hypertension Mother   . Depression Mother   . Macular degeneration Mother   . Stroke Father   . Diabetes Father     Past Surgical History:  Procedure Laterality Date  . SKIN BIOPSY     ?basal/squam cell on chest  . TONSILLECTOMY AND  ADENOIDECTOMY     Social History   Occupational History  . Not on file.   Social History Main Topics  . Smoking status: Former Smoker    Packs/day: 0.50    Years: 15.00    Types: Cigarettes    Quit date: 03/23/1980  . Smokeless tobacco: Never Used  . Alcohol use 0.0 oz/week     Comment: social  . Drug use: No  . Sexual activity: Yes

## 2016-12-30 ENCOUNTER — Other Ambulatory Visit: Payer: Self-pay | Admitting: Physician Assistant

## 2017-01-05 DIAGNOSIS — L821 Other seborrheic keratosis: Secondary | ICD-10-CM | POA: Diagnosis not present

## 2017-01-05 DIAGNOSIS — Z85828 Personal history of other malignant neoplasm of skin: Secondary | ICD-10-CM | POA: Diagnosis not present

## 2017-01-05 DIAGNOSIS — D1801 Hemangioma of skin and subcutaneous tissue: Secondary | ICD-10-CM | POA: Diagnosis not present

## 2017-01-05 DIAGNOSIS — D2262 Melanocytic nevi of left upper limb, including shoulder: Secondary | ICD-10-CM | POA: Diagnosis not present

## 2017-01-05 DIAGNOSIS — D225 Melanocytic nevi of trunk: Secondary | ICD-10-CM | POA: Diagnosis not present

## 2017-01-11 DIAGNOSIS — Z13820 Encounter for screening for osteoporosis: Secondary | ICD-10-CM | POA: Diagnosis not present

## 2017-01-11 DIAGNOSIS — N958 Other specified menopausal and perimenopausal disorders: Secondary | ICD-10-CM | POA: Diagnosis not present

## 2017-01-11 DIAGNOSIS — M8588 Other specified disorders of bone density and structure, other site: Secondary | ICD-10-CM | POA: Diagnosis not present

## 2017-02-28 HISTORY — PX: COLONOSCOPY: SHX174

## 2017-03-02 ENCOUNTER — Other Ambulatory Visit: Payer: Self-pay | Admitting: Physician Assistant

## 2017-03-02 ENCOUNTER — Encounter: Payer: Self-pay | Admitting: Physician Assistant

## 2017-03-02 DIAGNOSIS — F325 Major depressive disorder, single episode, in full remission: Secondary | ICD-10-CM

## 2017-03-02 MED ORDER — AMPHETAMINE-DEXTROAMPHETAMINE 30 MG PO TABS: 30.0000 mg | ORAL_TABLET | Freq: Two times a day (BID) | ORAL | 0 refills | Status: DC

## 2017-03-02 NOTE — Progress Notes (Signed)
Future Appointments  Date Time Provider [place]  04/05/2017  9:00 AM Vicie Mutters, PA-C [facility] None

## 2017-03-23 DIAGNOSIS — Z01419 Encounter for gynecological examination (general) (routine) without abnormal findings: Secondary | ICD-10-CM | POA: Diagnosis not present

## 2017-03-23 DIAGNOSIS — Z1231 Encounter for screening mammogram for malignant neoplasm of breast: Secondary | ICD-10-CM | POA: Diagnosis not present

## 2017-04-03 NOTE — Progress Notes (Signed)
CPE and follow up visit.  Assessment:   HTN, labile - continue medications, DASH diet, exercise and monitor at home. Call if greater than 130/80.    Hyperlipemia -continue medications, check lipids, decrease fatty foods, increase activity.   Obesity Obesity with co morbidities- long discussion about weight loss, diet, and exercise   Attention deficit disorder (ADD) Continue adderall [facility]  Abnormal glucose Discussed general issues about diabetes pathophysiology and management., Educational material distributed., Suggested low cholesterol diet., Encouraged aerobic exercise., Discussed foot care., Reminded to get yearly retinal exam.   Anemia, unspecified anemia type - monitor, continue iron supp with Vitamin C and increase green leafy veggies   Allergy, subsequent encounter Continue OTC allergy pills  Depression, major, in remission ([place]) remission  Ocular rosacea Follow up eye doctor   Osteopenia Get DEXA, continue vitamin D,Calcium  Vitamin D deficiency - VITAMIN D 25 Hydroxy (Vit-D Deficiency, Fractures)   Medication management - Magnesium  HSV infection Refill meds  Complete physical Will schedule MGM and colonoscopy  TMJ (temporomandibular joint syndrome) -     cyclobenzaprine (FLEXERIL) 10 MG tablet; Take 1 tablet (10 mg total) by mouth 3 (three) times daily as needed for muscle spasms. Start to wear night guard, follow up with dentist Info given  Diarrhea, unspecified type -     Ambulatory referral to Gastroenterology -     metroNIDAZOLE (FLAGYL) 500 MG tablet; Take 1 tablet (500 mg total) by mouth 3 (three) times daily for 14 days. -  X Nov, ? IBS versus infectious, will start on flagyl and refer to GI  Gastroesophageal reflux disease with esophagitis -     Ambulatory referral to Gastroenterology - start on PPI/H2 blocker, diet discussed     Future Appointments  Date Time Provider [place]  04/09/2018   9:00 AM Vicie Mutters, PA-C [facility] None     Subjective:   Kathy Cook is a 72 y.o. Cook who presents for CPE and 3 month follow up on hypertension, hyperlipidemia, vitamin D def.   She complains of burping, beltching, and diarrhea up to 7 x day after zetia start but stopped in Nov, no ABX in past 3 months, she has been visiting her mom in [place]. Has improved some.  No fever, chills, AB pain with BM improving. She also complains of GERD symptoms, sore throat, has taken pepcid. She also has some right ear pain.   Her blood pressure has been controlled at home, today their BP is BP: 122/74 She does workout, walks. She denies chest pain, shortness of breath, dizziness.  She is not on cholesterol medication, can not tolerate statins, she tried zetia but states she stopped in Nov due to GI issues, see above.    Her cholesterol is not at goal. The cholesterol last visit was:   Lab Results  Component Value Date   CHOL 288 (H) 10/04/2016   HDL 93 10/04/2016   LDLCALC 149 (H) 10/04/2016   TRIG 229 (H) 10/04/2016   CHOLHDL 3.1 10/04/2016   Last A1C in the office was:  Lab Results  Component Value Date   HGBA1C 5.5 09/24/2015   Patient is on Vitamin D supplement. Lab Results  Component Value Date   VD25OH 54 04/04/2016   She does well on the Adderall, helps with concentration.  Has HSV 1 and has famcyclovir for occ out break.  Has been seeing Dr. Oneida Alar for bilateral feet OA, feeling better, has orthotics.  BMI is Body mass index is 32.01 kg/m., she is working on diet and exercise. Wt Readings from Last 3 Encounters:  04/05/17 175 lb (79.4 kg)  10/03/16 176 lb (79.8 kg)  06/09/16 176 lb (79.8 kg)    Names of Other Physician/Practitioners you currently use: 1. [facility]- here for primary care 2. [place], eye doctor, 04/2015 3.  Dr. Johnnye Sima, dentist, last visit q 6 months Patient Care Team: Unk Pinto, MD as PCP - General  (Internal Medicine) Dian Queen, MD as Consulting Physician (Obstetrics and Gynecology) Clent Jacks, MD as Consulting Physician (Ophthalmology) Lindwood Coke, MD as Consulting Physician (Dermatology)  Medication Review Current Outpatient Medications on File Prior to Visit  Medication Sig Dispense Refill  . acetaminophen-codeine (TYLENOL #3) 300-30 MG tablet Take 1-2 tablets by mouth every 8 (eight) hours as needed for moderate pain. 60 tablet 0  . amphetamine-dextroamphetamine (ADDERALL) 30 MG tablet Take 1 tablet by mouth 2 (two) times daily. 60 tablet 0  . buPROPion (WELLBUTRIN XL) 150 MG 24 hr tablet take 1 tablet by mouth every morning 30 tablet 2  . Cholecalciferol (VITAMIN D3) 5000 UNITS TABS Take 10,000 Units by mouth.    . citalopram (CELEXA) 20 MG tablet take 1 tablet by mouth once daily 30 tablet 2  . Estradiol-Estriol-Progesterone (BIEST/PROGESTERONE) CREA Place onto the skin. Marland Kitchen05 mg/ml cream to apply daily    . famciclovir (FAMVIR) 500 MG tablet Take 1 tablet (500 mg total) by mouth 2 (two) times daily. 60 tablet 0  . Omega-3 Fatty Acids (FISH OIL) 1000 MG CAPS Take by mouth daily.    . Progesterone Micronized 10 % CREA Place onto the skin. Patient applies 2 % cream daily    . ezetimibe (ZETIA) 10 MG tablet Take 1 tablet (10 mg total) by mouth daily. (Patient not taking: Reported on 04/05/2017) 30 tablet 3   No current facility-administered medications on file prior to visit.     Current Problems (verified) Patient Active Problem List   Diagnosis Date Noted  . Metatarsal stress fracture, right, initial encounter 10/03/2016  . DJD (degenerative joint disease) 06/09/2016  . Labile hypertension 08/01/2014  . Vitamin D deficiency 08/01/2014  . Medication management 08/01/2014  . Obesity 07/04/2014  . Cavus deformity of foot 07/04/2014  . Hyperlipemia 06/27/2013  . Attention deficit disorder (ADD)   . GERD (gastroesophageal reflux disease)   . Allergy   . Depression,  major, in remission ([place])   . Anemia   . Ocular rosacea   . HSV infection   . Osteopenia     Screening Tests Immunization History  Administered Date(s) Administered  . DT 01/14/2015  . Pneumococcal Conjugate-13 03/24/2015  . Pneumococcal-Unspecified 12/31/2012  . Td 03/29/2004    Preventative care: Last colonoscopy: 2006 due 2016 OVER DUE Last mammogram: 02/2017 AT [facility] Last pap smear/pelvic exam: 02/2016  DEXA  GYN  Prior vaccinations: TD or Tdap: 2016  Influenza: declines Pneumococcal: 2014 Prevnar 13: 2017 Shingles/Zostavax: DUE will get at pharmacy  Patient Care Team: Unk Pinto, MD as PCP - General (Internal Medicine) Dian Queen, MD as Consulting Physician (Obstetrics and Gynecology) Clent Jacks, MD as Consulting Physician (Ophthalmology) Lindwood Coke, MD as Consulting Physician (Dermatology)  Allergies Allergies  Allergen Reactions  . Prednisone   . Statins     paralyzing  . Strattera [Atomoxetine Hcl]     Increased sadness    SURGICAL HISTORY She  has a past surgical history that includes Tonsillectomy and adenoidectomy and Skin  biopsy. FAMILY HISTORY Her family history includes Depression in her mother; Diabetes in her father; Hypertension in her mother; Macular degeneration in her mother; Stroke in her father. SOCIAL HISTORY She  reports that she quit smoking about 37 years ago. Her smoking use included cigarettes. She has a 7.50 pack-year smoking history. she has never used smokeless tobacco. She reports that she drinks alcohol. She reports that she does not use drugs.  Review of Systems  Constitutional: Negative.   HENT: Positive for ear pain.   Eyes: Negative.   Respiratory: Negative.   Cardiovascular: Negative.   Gastrointestinal: Positive for abdominal pain, diarrhea and heartburn. Negative for blood in stool, constipation, melena, nausea and vomiting.  Genitourinary: Negative.   Musculoskeletal: Negative.   Skin: Negative.    Neurological: Positive for tingling (back worse with stress). Negative for dizziness, tremors, sensory change, speech change, focal weakness, seizures, loss of consciousness and headaches.     Objective:   Blood pressure 122/74, pulse 69, temperature 97.9 F (36.6 C), height 5\' 2"  (1.575 m), weight 175 lb (79.4 kg), SpO2 99 %. Body mass index is 32.01 kg/m.  General appearance: alert, no distress, WD/WN,  Cook HEENT: normocephalic, sclerae anicteric, TMs pearly, nares patent, no discharge or erythema, pharynx normal Oral cavity: MMM, no lesions Neck: supple, no lymphadenopathy, no thyromegaly, no masses Heart: RRR, normal S1, S2, no murmurs Lungs: CTA bilaterally, no wheezes, rhonchi, or rales Abdomen: +bs, soft, non tender, non distended, no masses, no hepatomegaly, no splenomegaly Musculoskeletal: nontender, no swelling, no obvious deformity Extremities: no edema, no cyanosis, no clubbing Pulses: 2+ symmetric, upper and lower extremities, normal cap refill Neurological: alert, oriented x 3, CN2-12 intact, strength normal upper extremities and lower extremities, sensation normal throughout, DTRs 2+ throughout, no cerebellar signs, gait normal Psychiatric: normal affect, behavior normal, pleasant  Skin: without rashes, lesions, bumps Breast: defer Gyn: defer Rectal: defer  EKG WNL  Vicie Mutters, PA-C   04/05/2017

## 2017-04-05 ENCOUNTER — Other Ambulatory Visit: Payer: Self-pay | Admitting: Physician Assistant

## 2017-04-05 ENCOUNTER — Ambulatory Visit (INDEPENDENT_AMBULATORY_CARE_PROVIDER_SITE_OTHER): Payer: Medicare Other | Admitting: Physician Assistant

## 2017-04-05 ENCOUNTER — Encounter: Payer: Self-pay | Admitting: Internal Medicine

## 2017-04-05 ENCOUNTER — Encounter: Payer: Self-pay | Admitting: Physician Assistant

## 2017-04-05 VITALS — BP 122/74 | HR 69 | Temp 97.9°F | Ht 62.0 in | Wt 175.0 lb

## 2017-04-05 DIAGNOSIS — R0989 Other specified symptoms and signs involving the circulatory and respiratory systems: Secondary | ICD-10-CM | POA: Diagnosis not present

## 2017-04-05 DIAGNOSIS — Z136 Encounter for screening for cardiovascular disorders: Secondary | ICD-10-CM

## 2017-04-05 DIAGNOSIS — E559 Vitamin D deficiency, unspecified: Secondary | ICD-10-CM | POA: Diagnosis not present

## 2017-04-05 DIAGNOSIS — K219 Gastro-esophageal reflux disease without esophagitis: Secondary | ICD-10-CM

## 2017-04-05 DIAGNOSIS — Z0000 Encounter for general adult medical examination without abnormal findings: Secondary | ICD-10-CM | POA: Diagnosis not present

## 2017-04-05 DIAGNOSIS — R197 Diarrhea, unspecified: Secondary | ICD-10-CM

## 2017-04-05 DIAGNOSIS — F988 Other specified behavioral and emotional disorders with onset usually occurring in childhood and adolescence: Secondary | ICD-10-CM

## 2017-04-05 DIAGNOSIS — M150 Primary generalized (osteo)arthritis: Secondary | ICD-10-CM

## 2017-04-05 DIAGNOSIS — K2100 Gastro-esophageal reflux disease with esophagitis, without bleeding: Secondary | ICD-10-CM

## 2017-04-05 DIAGNOSIS — L718 Other rosacea: Secondary | ICD-10-CM

## 2017-04-05 DIAGNOSIS — M8580 Other specified disorders of bone density and structure, unspecified site: Secondary | ICD-10-CM

## 2017-04-05 DIAGNOSIS — Z79899 Other long term (current) drug therapy: Secondary | ICD-10-CM

## 2017-04-05 DIAGNOSIS — I10 Essential (primary) hypertension: Secondary | ICD-10-CM

## 2017-04-05 DIAGNOSIS — M159 Polyosteoarthritis, unspecified: Secondary | ICD-10-CM

## 2017-04-05 DIAGNOSIS — F325 Major depressive disorder, single episode, in full remission: Secondary | ICD-10-CM

## 2017-04-05 DIAGNOSIS — E785 Hyperlipidemia, unspecified: Secondary | ICD-10-CM

## 2017-04-05 DIAGNOSIS — D649 Anemia, unspecified: Secondary | ICD-10-CM

## 2017-04-05 DIAGNOSIS — M26609 Unspecified temporomandibular joint disorder, unspecified side: Secondary | ICD-10-CM

## 2017-04-05 MED ORDER — CYCLOBENZAPRINE HCL 10 MG PO TABS: 10.0000 mg | ORAL_TABLET | Freq: Three times a day (TID) | ORAL | 0 refills | Status: DC | PRN

## 2017-04-05 MED ORDER — METRONIDAZOLE 500 MG PO TABS: 500.0000 mg | ORAL_TABLET | Freq: Three times a day (TID) | ORAL | 0 refills | Status: AC

## 2017-04-05 NOTE — Patient Instructions (Signed)
Get on pepcid twice a day for 2-4 weeks See below  Take flexeril at night for 5-6 nights Heating pad  Soft foods and see below  See FOD MAP diet  Take omeprazole over the counter for 2 weeks, then go to zantac 150-300 mg OR pepcid 20 or 40mg  at night for 2 weeks, then you can stop or continue as needed.  Avoid alcohol, spicy foods, NSAIDS (aleve, ibuprofen) at this time. See foods below.   Food Choices for Gastroesophageal Reflux Disease When you have gastroesophageal reflux disease (GERD), the foods you eat and your eating habits are very important. Choosing the right foods can help ease the discomfort of GERD. WHAT GENERAL GUIDELINES DO I NEED TO FOLLOW?  Choose fruits, vegetables, whole grains, low-fat dairy products, and low-fat meat, fish, and poultry.  Limit fats such as oils, salad dressings, butter, nuts, and avocado.  Keep a food diary to identify foods that cause symptoms.  Avoid foods that cause reflux. These may be different for different people.  Eat frequent small meals instead of three large meals each day.  Eat your meals slowly, in a relaxed setting.  Limit fried foods.  Cook foods using methods other than frying.  Avoid drinking alcohol.  Avoid drinking large amounts of liquids with your meals.  Avoid bending over or lying down until 2-3 hours after eating. WHAT FOODS ARE NOT RECOMMENDED? The following are some foods and drinks that may worsen your symptoms: Vegetables Tomatoes. Tomato juice. Tomato and spaghetti sauce. Chili peppers. Onion and garlic. Horseradish. Fruits Oranges, grapefruit, and lemon (fruit and juice). Meats High-fat meats, fish, and poultry. This includes hot dogs, ribs, ham, sausage, salami, and bacon. Dairy Whole milk and chocolate milk. Sour cream. Cream. Butter. Ice cream. Cream cheese.  Beverages Coffee and tea, with or without caffeine. Carbonated beverages or energy drinks. Condiments Hot sauce. Barbecue sauce.    Sweets/Desserts Chocolate and cocoa. Donuts. Peppermint and spearmint. Fats and Oils High-fat foods, including [place] fries and potato chips. Other Vinegar. Strong spices, such as black pepper, white pepper, red pepper, cayenne, curry powder, cloves, ginger, and chili powder.  What is the TMJ? The temporomandibular (tem-PUH-ro-man-DIB-yoo-ler) joint, or the TMJ, connects the upper and lower jawbones. This joint allows the jaw to open wide and move back and forth when you chew, talk, or yawn.There are also several muscles that help this joint move. There can be muscle tightness and pain in the muscle that can cause several symptoms.  What causes TMJ pain? There are many causes of TMJ pain. Repeated chewing (for example, chewing gum) and clenching your teeth can cause pain in the joint. Some TMJ pain has no obvious cause. What can I do to ease the pain? There are many things you can do to help your pain get better. When you have pain:  Eat soft foods and stay away from chewy foods (for example, taffy) Try to use both sides of your mouth to chew Don't chew gum Massage Don't open your mouth wide (for example, during yawning or singing) Don't bite your cheeks or fingernails Lower your amount of stress and worry Applying a warm, damp washcloth to the joint may help. Over-the-counter pain medicines such as ibuprofen (one brand: Advil) or acetaminophen (one brand: Tylenol) might also help. Do not use these medicines if you are allergic to them or if your doctor told you not to use them. How can I stop the pain from coming back? When your pain is better, you can  do these exercises to make your muscles stronger and to keep the pain from coming back:  Resisted mouth opening: Place your thumb or two fingers under your chin and open your mouth slowly, pushing up lightly on your chin with your thumb. Hold for three to six seconds. Close your mouth slowly. Resisted mouth closing: Place your thumbs under  your chin and your two index fingers on the ridge between your mouth and the bottom of your chin. Push down lightly on your chin as you close your mouth. Tongue up: Slowly open and close your mouth while keeping the tongue touching the roof of the mouth. Side-to-side jaw movement: Place an object about one fourth of an inch thick (for example, two tongue depressors) between your front teeth. Slowly move your jaw from side to side. Increase the thickness of the object as the exercise becomes easier Forward jaw movement: Place an object about one fourth of an inch thick between your front teeth and move the bottom jaw forward so that the bottom teeth are in front of the top teeth. Increase the thickness of the object as the exercise becomes easier. These exercises should not be painful. If it hurts to do these exercises, stop doing them and talk to your family doctor.   If diarrhea is severe - 4+ watery diarrhea episodes, take imodium to reduce fluid loss, and start on electrolyte supplemented fluids.   Please go to the ER if you have any severe AB pain, unable to hold down food/water, blood in stool or vomit, chest pain, shortness of breath, or any worsening symptoms.   Consider keeping a food diary- common causes of diarrhea are dairy, certain carbs...  FODMAP stands for fermentable oligo-, di-, mono-saccharides and polyols (1). These are the scientific terms used to classify groups of carbs that are notorious for triggering digestive symptoms like bloating, gas and stomach pain.   FODMAPs are found in a wide range of foods in varying amounts. Some foods contain just one type, while others contain several.  The main dietary sources of the four groups of FODMAPs include:  Oligosaccharides: Wheat, rye, legumes and various fruits and vegetables, such as garlic and onions.  Disaccharides: Milk, yogurt and soft cheese. Lactose is the main carb.  Monosaccharides: Various fruit including figs and  mangoes, and sweeteners such as honey and agave nectar. Fructose is the main carb.  Polyols: Certain fruits and vegetables including blackberries and lychee, as well as some low-calorie sweeteners like those in sugar-free gum.   Keep a food diary. This will help you identify foods that cause symptoms. Write down: ? What you eat and when. ? What symptoms you have. ? When symptoms occur in relation to your meals.  Avoid foods that cause symptoms. Talk with your dietitian about other ways to get the same nutrients that are in these foods.  Eat your meals slowly, in a relaxed setting.  Aim to eat 5-6 small meals per day. Do not skip meals.  Drink enough fluids to keep your urine clear or pale yellow.  Ask your health care provider if you should take an over-the-counter probiotic during flare-ups to help restore healthy gut bacteria.  If you have cramping or diarrhea, try making your meals low in fat and high in carbohydrates. Examples of carbohydrates are pasta, rice, whole grain breads and cereals, fruits, and vegetables.  If dairy products cause your symptoms to flare up, try eating less of them. You might be able to handle yogurt better  than other dairy products because it contains bacteria that help with digestion.

## 2017-04-06 ENCOUNTER — Other Ambulatory Visit: Payer: Self-pay | Admitting: Physician Assistant

## 2017-04-06 DIAGNOSIS — F325 Major depressive disorder, single episode, in full remission: Secondary | ICD-10-CM

## 2017-04-06 LAB — LIPID PANEL
CHOL/HDL RATIO: 2.6 (calc) (ref <5.0)
CHOLESTEROL: 300 mg/dL — AB (ref <200)
HDL: 116 mg/dL (ref >50)
LDL Cholesterol (Calc): 161 mg/dL (calc) — ABNORMAL HIGH
Non-HDL Cholesterol (Calc): 184 mg/dL (calc) — ABNORMAL HIGH (ref <130)
Triglycerides: 109 mg/dL (ref <150)

## 2017-04-06 LAB — HEPATIC FUNCTION PANEL
AG Ratio: 1.9 (calc) (ref 1.0–2.5)
ALBUMIN MSPROF: 4.3 g/dL (ref 3.6–5.1)
ALT: 11 U/L (ref 6–29)
AST: 13 U/L (ref 10–35)
Alkaline phosphatase (APISO): 55 U/L (ref 33–130)
BILIRUBIN DIRECT: 0.1 mg/dL (ref 0.0–0.2)
GLOBULIN: 2.3 g/dL (ref 1.9–3.7)
Indirect Bilirubin: 0.6 mg/dL (calc) (ref 0.2–1.2)
Total Bilirubin: 0.7 mg/dL (ref 0.2–1.2)
Total Protein: 6.6 g/dL (ref 6.1–8.1)

## 2017-04-06 LAB — URINALYSIS, ROUTINE W REFLEX MICROSCOPIC
BILIRUBIN URINE: NEGATIVE
Bacteria, UA: NONE SEEN /HPF
GLUCOSE, UA: NEGATIVE
Hgb urine dipstick: NEGATIVE
Hyaline Cast: NONE SEEN /LPF
KETONES UR: NEGATIVE
NITRITE: NEGATIVE
Protein, ur: NEGATIVE
SPECIFIC GRAVITY, URINE: 1.025 (ref 1.001–1.03)
WBC, UA: NONE SEEN /HPF (ref 0–5)
pH: 6 (ref 5.0–8.0)

## 2017-04-06 LAB — CBC WITH DIFFERENTIAL/PLATELET
BASOS PCT: 0.5 %
Basophils Absolute: 30 cells/uL (ref 0–200)
Eosinophils Absolute: 204 cells/uL (ref 15–500)
Eosinophils Relative: 3.4 %
HCT: 40.7 % (ref 35.0–45.0)
HEMOGLOBIN: 13.9 g/dL (ref 11.7–15.5)
Lymphs Abs: 1080 cells/uL (ref 850–3900)
MCH: 32.4 pg (ref 27.0–33.0)
MCHC: 34.2 g/dL (ref 32.0–36.0)
MCV: 94.9 fL (ref 80.0–100.0)
MPV: 10.5 fL (ref 7.5–12.5)
Monocytes Relative: 10.1 %
Neutro Abs: 4080 cells/uL (ref 1500–7800)
Neutrophils Relative %: 68 %
Platelets: 212 10*3/uL (ref 140–400)
RBC: 4.29 10*6/uL (ref 3.80–5.10)
RDW: 11.7 % (ref 11.0–15.0)
Total Lymphocyte: 18 %
WBC: 6 10*3/uL (ref 3.8–10.8)
WBCMIX: 606 {cells}/uL (ref 200–950)

## 2017-04-06 LAB — BASIC METABOLIC PANEL WITH GFR
BUN: 15 mg/dL (ref 7–25)
CALCIUM: 9.6 mg/dL (ref 8.6–10.4)
CHLORIDE: 105 mmol/L (ref 98–110)
CO2: 31 mmol/L (ref 20–32)
Creat: 0.93 mg/dL (ref 0.50–0.99)
GFR, EST AFRICAN AMERICAN: 73 mL/min/{1.73_m2} (ref >=60)
GFR, EST NON AFRICAN AMERICAN: 63 mL/min/{1.73_m2} (ref >=60)
Glucose, Bld: 99 mg/dL (ref 65–99)
Potassium: 4.8 mmol/L (ref 3.5–5.3)
Sodium: 142 mmol/L (ref 135–146)

## 2017-04-06 LAB — VITAMIN D 25 HYDROXY (VIT D DEFICIENCY, FRACTURES): Vit D, 25-Hydroxy: 45 ng/mL (ref 30–100)

## 2017-04-06 LAB — TSH: TSH: 2.27 m[IU]/L (ref 0.40–4.50)

## 2017-04-06 LAB — VITAMIN B12: VITAMIN B 12: 347 pg/mL (ref 200–1100)

## 2017-04-06 LAB — MICROALBUMIN / CREATININE URINE RATIO
Creatinine, Urine: 220 mg/dL (ref 20–275)
MICROALB UR: 0.5 mg/dL
Microalb Creat Ratio: 2 mcg/mg creat (ref <30)

## 2017-04-06 LAB — MAGNESIUM: Magnesium: 2.2 mg/dL (ref 1.5–2.5)

## 2017-04-06 LAB — IRON, TOTAL/TOTAL IRON BINDING CAP
%SAT: 34 % (ref 11–50)
IRON: 114 ug/dL (ref 45–160)
TIBC: 332 ug/dL (ref 250–450)

## 2017-04-06 MED ORDER — AMPHETAMINE-DEXTROAMPHETAMINE 30 MG PO TABS: 30.0000 mg | ORAL_TABLET | Freq: Two times a day (BID) | ORAL | 0 refills | Status: DC

## 2017-04-11 ENCOUNTER — Encounter: Payer: Self-pay | Admitting: Internal Medicine

## 2017-04-25 DIAGNOSIS — H43812 Vitreous degeneration, left eye: Secondary | ICD-10-CM | POA: Diagnosis not present

## 2017-04-25 DIAGNOSIS — H2513 Age-related nuclear cataract, bilateral: Secondary | ICD-10-CM | POA: Diagnosis not present

## 2017-04-25 DIAGNOSIS — H353131 Nonexudative age-related macular degeneration, bilateral, early dry stage: Secondary | ICD-10-CM | POA: Diagnosis not present

## 2017-05-02 DIAGNOSIS — R140 Abdominal distension (gaseous): Secondary | ICD-10-CM | POA: Diagnosis not present

## 2017-05-02 DIAGNOSIS — R194 Change in bowel habit: Secondary | ICD-10-CM | POA: Diagnosis not present

## 2017-05-02 DIAGNOSIS — K219 Gastro-esophageal reflux disease without esophagitis: Secondary | ICD-10-CM | POA: Diagnosis not present

## 2017-05-25 DIAGNOSIS — K219 Gastro-esophageal reflux disease without esophagitis: Secondary | ICD-10-CM | POA: Diagnosis not present

## 2017-05-25 DIAGNOSIS — R194 Change in bowel habit: Secondary | ICD-10-CM | POA: Diagnosis not present

## 2017-05-25 DIAGNOSIS — Z1211 Encounter for screening for malignant neoplasm of colon: Secondary | ICD-10-CM | POA: Diagnosis not present

## 2017-07-04 ENCOUNTER — Telehealth: Payer: Self-pay | Admitting: Physician Assistant

## 2017-07-04 ENCOUNTER — Other Ambulatory Visit: Payer: Self-pay

## 2017-07-04 DIAGNOSIS — F325 Major depressive disorder, single episode, in full remission: Secondary | ICD-10-CM

## 2017-07-04 MED ORDER — FAMCICLOVIR 500 MG PO TABS: 500.0000 mg | ORAL_TABLET | Freq: Two times a day (BID) | ORAL | 0 refills | Status: DC

## 2017-07-04 MED ORDER — AMPHETAMINE-DEXTROAMPHETAMINE 30 MG PO TABS: 30.0000 mg | ORAL_TABLET | Freq: Two times a day (BID) | ORAL | 0 refills | Status: DC

## 2017-07-04 NOTE — Telephone Encounter (Signed)
-----   Message from Elenor Quinones, [place] sent at 07/04/2017  2:26 PM EDT ----- Regarding: refill Refill on: ADDERALL Per yellow note  Pharmacy: [facility]

## 2017-07-12 DIAGNOSIS — R194 Change in bowel habit: Secondary | ICD-10-CM | POA: Diagnosis not present

## 2017-07-12 DIAGNOSIS — Z1211 Encounter for screening for malignant neoplasm of colon: Secondary | ICD-10-CM | POA: Diagnosis not present

## 2017-07-12 LAB — HM COLONOSCOPY

## 2017-09-21 ENCOUNTER — Other Ambulatory Visit: Payer: Self-pay | Admitting: Internal Medicine

## 2017-10-25 NOTE — Progress Notes (Signed)
WELLNESS and follow up visit.  Assessment:   HTN, labile - continue medications, DASH diet, exercise and monitor at home. Call if greater than 130/80.    Hyperlipemia -continue medications, check lipids, decrease fatty foods, increase activity.  Try welchol  statin intolerant  Obesity Obesity with co morbidities- long discussion about weight loss, diet, and exercise   Attention deficit disorder (ADD) Continue adderall  Abnormal glucose Discussed general issues about diabetes pathophysiology and management., Educational material distributed., Suggested low cholesterol diet., Encouraged aerobic exercise., Discussed foot care., Reminded to get yearly retinal exam.   Anemia, unspecified anemia type - monitor, continue iron supp with Vitamin C and increase green leafy veggies   Allergy, subsequent encounter Continue OTC allergy pills  Depression, major, in remission ([place]) Remission Cut celexa in half and montior symptoms  Ocular rosacea Follow up eye doctor   Osteopenia Get DEXA, continue vitamin D,Calcium  Gastroesophageal reflux disease, esophagitis presence not specified Continue PPI/H2 blocker, diet discussed  Vitamin D deficiency - VITAMIN D 25 Hydroxy (Vit-D Deficiency, Fractures)   Medication management - Magnesium  HSV infection Refill meds  Complete physical UTD   Future Appointments  Date Time Provider [place]  04/09/2018  9:00 AM Vicie Mutters, PA-C [id number] None      Plan:   During the course of the visit the patient was educated and counseled about appropriate screening and preventive services including:    Pneumococcal vaccine   Influenza vaccine  Td vaccine  Screening electrocardiogram  Screening mammography  Bone densitometry screening  Colorectal cancer screening  Diabetes screening  Glaucoma screening  Nutrition counseling   Advanced directives: given info   Subjective:   Kathy Cook is a 72 y.o.  female who presents for Medicare Annual Wellness Visit and 3 month follow up on hypertension, hyperlipidemia, vitamin D def.   Her blood pressure has been controlled at home, today their BP is BP: 120/72 She does workout, walks. She denies chest pain, shortness of breath, dizziness.  She is not on cholesterol medication, can not tolerate statins and could not tolerate zetia due to diarrhea.  Her cholesterol is not at goal. The cholesterol last visit was:   Lab Results  Component Value Date   CHOL 300 (H) 04/05/2017   HDL 116 04/05/2017   LDLCALC 161 (H) 04/05/2017   TRIG 109 04/05/2017   CHOLHDL 2.6 04/05/2017   Last A1C in the office was:  Lab Results  Component Value Date   HGBA1C 5.5 09/24/2015   Patient is on Vitamin D supplement. Lab Results  Component Value Date   VD25OH 45 04/05/2017   She does well on the Adderall, helps with concentration however she feels that when she takes drug holidays that she is very very tired.  Has HSV 1 and has famcyclovir for occ out break.  Mother 69 passed in April, she has grief with this but feels that with her medications she is having a hard time processing.  Has been seeing Dr. Oneida Alar for bilateral feet OA, feeling better, has orthotics.  BMI is Body mass index is 34.24 kg/m., she is working on diet and exercise. Wt Readings from Last 3 Encounters:  10/26/17 187 lb 3.2 oz (84.9 kg)  04/05/17 175 lb (79.4 kg)  10/03/16 176 lb (79.8 kg)    Names of Other Physician/Practitioners you currently use: 1. [facility]- here for primary care 2. [place], eye doctor, 04/2015 3.  Dr. Johnnye Sima, dentist, last visit q 6 months  Patient Care Team: Unk Pinto, MD as PCP - General (Internal Medicine) Dian Queen, MD as Consulting Physician (Obstetrics and Gynecology) Clent Jacks, MD as Consulting Physician (Ophthalmology) Lindwood Coke, MD as Consulting Physician (Dermatology)  Medication  Review Current Outpatient Medications on File Prior to Visit  Medication Sig Dispense Refill  . acetaminophen-codeine (TYLENOL #3) 300-30 MG tablet Take 1-2 tablets by mouth every 8 (eight) hours as needed for moderate pain. 60 tablet 0  . amphetamine-dextroamphetamine (ADDERALL) 30 MG tablet Take 1 tablet by mouth 2 (two) times daily. 60 tablet 0  . buPROPion (WELLBUTRIN XL) 150 MG 24 hr tablet TAKE 1 TABLET BY MOUTH ONCE DAILY 90 tablet 0  . Cholecalciferol (VITAMIN D3) 5000 UNITS TABS Take 10,000 Units by mouth.    . citalopram (CELEXA) 20 MG tablet TAKE 1 TABLET BY MOUTH ONCE DAILY 90 tablet 0  . cyclobenzaprine (FLEXERIL) 10 MG tablet Take 1 tablet (10 mg total) by mouth 3 (three) times daily as needed for muscle spasms. 30 tablet 0  . Estradiol-Estriol-Progesterone (BIEST/PROGESTERONE) CREA Place onto the skin. Marland Kitchen05 mg/ml cream to apply daily    . famciclovir (FAMVIR) 500 MG tablet Take 1 tablet (500 mg total) by mouth 2 (two) times daily. 60 tablet 0  . Omega-3 Fatty Acids (FISH OIL) 1000 MG CAPS Take by mouth daily.    . Progesterone Micronized 10 % CREA Place onto the skin. Patient applies 2 % cream daily     No current facility-administered medications on file prior to visit.     Current Problems (verified) Patient Active Problem List   Diagnosis Date Noted  . Metatarsal stress fracture, right, initial encounter 10/03/2016  . DJD (degenerative joint disease) 06/09/2016  . Labile hypertension 08/01/2014  . Vitamin D deficiency 08/01/2014  . Medication management 08/01/2014  . Obesity 07/04/2014  . Hyperlipemia 06/27/2013  . Attention deficit disorder (ADD)   . GERD (gastroesophageal reflux disease)   . Allergy   . Depression, major, in remission ([place])   . Anemia   . Ocular rosacea   . HSV infection   . Osteopenia     Screening Tests Immunization History  Administered Date(s) Administered  . DT 01/14/2015  . Pneumococcal Conjugate-13 03/24/2015  .  Pneumococcal-Unspecified 12/31/2012  . Td 03/29/2004    Preventative care: Last colonoscopy: 06/2017 last colonoscopy per Dr. Collene Mares Last mammogram: 02/2016 at [facility] Last pap smear/pelvic exam: 02/2016 at [facility] Dr. Tressia Danas DEXA 2016 getting next year at [facility]  Prior vaccinations: TD or Tdap: 2016  Influenza: declines Pneumococcal: 2014 Prevnar 13: 2017 Shingles/Zostavax: DUE  Allergies Allergies  Allergen Reactions  . Prednisone   . Statins     paralyzing  . Strattera [Atomoxetine Hcl]     Increased sadness    SURGICAL HISTORY She  has a past surgical history that includes Tonsillectomy and adenoidectomy and Skin biopsy. FAMILY HISTORY Her family history includes Depression in her mother; Diabetes in her father; Hypertension in her mother; Macular degeneration in her mother; Stroke in her father. SOCIAL HISTORY She  reports that she quit smoking about 37 years ago. Her smoking use included cigarettes. She has a 7.50 pack-year smoking history. She has never used smokeless tobacco. She reports that she drinks alcohol. She reports that she does not use drugs.  MEDICARE WELLNESS OBJECTIVES: Physical activity:   Cardiac risk factors:   Depression/mood screen:   Depression screen [facility] 2/9 06/09/2016  Decreased Interest 0  Down, Depressed, Hopeless 0  PHQ - 2 Score 0  ADLs:  No flowsheet data found.   Cognitive Testing  Alert? Yes  Normal Appearance?Yes  Oriented to person? Yes  Place? Yes   Time? Yes  Recall of three objects?  Yes  Can perform simple calculations? Yes  Displays appropriate judgment?Yes  Can read the correct time from a watch face?Yes  EOL planning: Does Patient Have a Medical Advance Directive?: Yes Type of Advance Directive: Healthcare Power of Attorney, Living will Copy of [place] in Chart?: No - copy requested    Review of Systems  Constitutional: Negative.   HENT: Negative.   Eyes: Negative.   Respiratory: Negative.    Cardiovascular: Negative.   Gastrointestinal: Negative.   Genitourinary: Negative.   Musculoskeletal: Negative.   Skin: Negative.   Neurological: Positive for tingling (back worse with stress). Negative for dizziness, tremors, sensory change, speech change, focal weakness, seizures, loss of consciousness and headaches.     Objective:   Blood pressure 120/72, pulse 64, temperature 98.2 F (36.8 C), height 5\' 2"  (1.575 m), weight 187 lb 3.2 oz (84.9 kg), SpO2 98 %. Body mass index is 34.24 kg/m.  General appearance: alert, no distress, WD/WN,  female HEENT: normocephalic, sclerae anicteric, TMs pearly, nares patent, no discharge or erythema, pharynx normal Oral cavity: MMM, no lesions Neck: supple, no lymphadenopathy, no thyromegaly, no masses Heart: RRR, normal S1, S2, no murmurs Lungs: CTA bilaterally, no wheezes, rhonchi, or rales Abdomen: +bs, soft, non tender, non distended, no masses, no hepatomegaly, no splenomegaly Musculoskeletal: nontender, no swelling, no obvious deformity Extremities: no edema, no cyanosis, no clubbing Pulses: 2+ symmetric, upper and lower extremities, normal cap refill Neurological: alert, oriented x 3, CN2-12 intact, strength normal upper extremities and lower extremities, sensation normal throughout, DTRs 2+ throughout, no cerebellar signs, gait normal Psychiatric: normal affect, behavior normal, pleasant  Breast: defer Gyn: defer Rectal: defer   Medicare Attestation I have personally reviewed: The patient's medical and social history Their use of alcohol, tobacco or illicit drugs Their current medications and supplements The patient's functional ability including ADLs,fall risks, home safety risks, cognitive, and hearing and visual impairment Diet and physical activities Evidence for depression or mood disorders  The patient's weight, height, BMI, and visual acuity have been recorded in the chart.  I have made referrals, counseling, and  provided education to the patient based on review of the above and I have provided the patient with a written personalized care plan for preventive services.      Vicie Mutters, PA-C   10/26/2017

## 2017-10-26 ENCOUNTER — Encounter: Payer: Self-pay | Admitting: Physician Assistant

## 2017-10-26 ENCOUNTER — Ambulatory Visit (INDEPENDENT_AMBULATORY_CARE_PROVIDER_SITE_OTHER): Payer: Medicare Other | Admitting: Physician Assistant

## 2017-10-26 VITALS — BP 120/72 | HR 64 | Temp 98.2°F | Ht 62.0 in | Wt 187.2 lb

## 2017-10-26 DIAGNOSIS — K219 Gastro-esophageal reflux disease without esophagitis: Secondary | ICD-10-CM

## 2017-10-26 DIAGNOSIS — R6889 Other general symptoms and signs: Secondary | ICD-10-CM | POA: Diagnosis not present

## 2017-10-26 DIAGNOSIS — L718 Other rosacea: Secondary | ICD-10-CM

## 2017-10-26 DIAGNOSIS — Z0001 Encounter for general adult medical examination with abnormal findings: Secondary | ICD-10-CM

## 2017-10-26 DIAGNOSIS — T7840XD Allergy, unspecified, subsequent encounter: Secondary | ICD-10-CM

## 2017-10-26 DIAGNOSIS — F325 Major depressive disorder, single episode, in full remission: Secondary | ICD-10-CM | POA: Diagnosis not present

## 2017-10-26 DIAGNOSIS — E6609 Other obesity due to excess calories: Secondary | ICD-10-CM

## 2017-10-26 DIAGNOSIS — Z6832 Body mass index (BMI) 32.0-32.9, adult: Secondary | ICD-10-CM

## 2017-10-26 DIAGNOSIS — R0989 Other specified symptoms and signs involving the circulatory and respiratory systems: Secondary | ICD-10-CM | POA: Diagnosis not present

## 2017-10-26 DIAGNOSIS — M8580 Other specified disorders of bone density and structure, unspecified site: Secondary | ICD-10-CM

## 2017-10-26 DIAGNOSIS — E785 Hyperlipidemia, unspecified: Secondary | ICD-10-CM

## 2017-10-26 DIAGNOSIS — M159 Polyosteoarthritis, unspecified: Secondary | ICD-10-CM

## 2017-10-26 DIAGNOSIS — F988 Other specified behavioral and emotional disorders with onset usually occurring in childhood and adolescence: Secondary | ICD-10-CM

## 2017-10-26 DIAGNOSIS — Z79899 Other long term (current) drug therapy: Secondary | ICD-10-CM | POA: Diagnosis not present

## 2017-10-26 DIAGNOSIS — M150 Primary generalized (osteo)arthritis: Secondary | ICD-10-CM

## 2017-10-26 DIAGNOSIS — E559 Vitamin D deficiency, unspecified: Secondary | ICD-10-CM

## 2017-10-26 DIAGNOSIS — D649 Anemia, unspecified: Secondary | ICD-10-CM

## 2017-10-26 DIAGNOSIS — B009 Herpesviral infection, unspecified: Secondary | ICD-10-CM

## 2017-10-26 MED ORDER — AMPHETAMINE-DEXTROAMPHETAMINE 30 MG PO TABS: 30.0000 mg | ORAL_TABLET | Freq: Two times a day (BID) | ORAL | 0 refills | Status: DC

## 2017-10-26 MED ORDER — COLESEVELAM HCL 3.75 G PO PACK: PACK | ORAL | 3 refills | Status: DC

## 2017-10-26 NOTE — Patient Instructions (Addendum)
Cut the celexa in half for 4 weeks and continue wellbutrin, see how you do Then if you are doing very well you can try to stop the celexa or continue throught the winter at the 10 mg  [employer] with no obligation # [phone] Do not have to be a member Tues-Sat 10-6  [place]- free test with no obligation # [phone] MUST BE A MEMBER Call for store hours  Have had patient's get good cheaper hearing aids from [facility] The air version has good reviews.    We are starting you on a new medication. Here is some general information.   1) Medications are not always the solution, any medication we put you on there is always a hope to come off of it depending on the medication. For example, If we start you on a hypertension medication, I would love to get you off of it and we can address that every visit if you wish. I'm always willing to try to get you off a medication unless I really feel that it is beneficial for you.   2) With what I mentioned above, there is no magic pill, I need you to put in the work to get off any medication you wish to not be on. So things to help is move a little each day, drink plenty of water, eat veggies/fruit, and don't smoke.   3) Every medication has a potential for a side effect. Even over the counter medications have a potential side effect. So I start you on a medication and there is something different over the next 1-3 months let me know. It is always possible that it can be the medication.   Here is some information below about your new medication.  If you have any concerns or questions please contact the office and not Dr. Essie Hart. =) Remember also that during a study ANY symptoms someone has can be listed as a side effect even if it was not caused by the medication.     Exercising to Stay Healthy Exercising regularly is important. It has many health benefits, such as:  Improving your overall fitness, flexibility,  and endurance.  Increasing your bone density.  Helping with weight control.  Decreasing your body fat.  Increasing your muscle strength.  Reducing stress and tension.  Improving your overall health.  In order to become healthy and stay healthy, it is recommended that you do moderate-intensity and vigorous-intensity exercise. You can tell that you are exercising at a moderate intensity if you have a higher heart rate and faster breathing, but you are still able to hold a conversation. You can tell that you are exercising at a vigorous intensity if you are breathing much harder and faster and cannot hold a conversation while exercising. How often should I exercise? Choose an activity that you enjoy and set realistic goals. Your health care provider can help you to make an activity plan that works for you. Exercise regularly as directed by your health care provider. This may include:  Doing resistance training twice each week, such as: ? Push-ups. ? Sit-ups. ? Lifting weights. ? Using resistance bands.  Doing a given intensity of exercise for a given amount of time. Choose from these options: ? 150 minutes of moderate-intensity exercise every week. ? 75 minutes of vigorous-intensity exercise every week. ? A mix of moderate-intensity and vigorous-intensity exercise every week.  Children, pregnant women, people who are out of shape, people who are  overweight, and older adults may need to consult a health care provider for individual recommendations. If you have any sort of medical condition, be sure to consult your health care provider before starting a new exercise program. What are some exercise ideas? Some moderate-intensity exercise ideas include:  Walking at a rate of 1 mile in 15 minutes.  Biking.  Hiking.  Golfing.  Dancing.  Some vigorous-intensity exercise ideas include:  Walking at a rate of at least 4.5 miles per hour.  Jogging or running at a rate of 5 miles per  hour.  Biking at a rate of at least 10 miles per hour.  Lap swimming.  Roller-skating or in-line skating.  Cross-country skiing.  Vigorous competitive sports, such as football, basketball, and soccer.  Jumping rope.  Aerobic dancing.  What are some everyday activities that can help me to get exercise?  [place] work, such as: ? Pushing a Conservation officer, nature. ? Raking and bagging leaves.  Washing and waxing your car.  Pushing a stroller.  Shoveling snow.  Gardening.  Washing windows or floors. How can I be more active in my day-to-day activities?  Use the stairs instead of the elevator.  Take a walk during your lunch break.  If you drive, park your car farther away from work or school.  If you take public transportation, get off one stop early and walk the rest of the way.  Make all of your phone calls while standing up and walking around.  Get up, stretch, and walk around every 30 minutes throughout the day. What guidelines should I follow while exercising?  Do not exercise so much that you hurt yourself, feel dizzy, or get very short of breath.  Consult your health care provider before starting a new exercise program.  Wear comfortable clothes and shoes with good support.  Drink plenty of water while you exercise to prevent dehydration or heat stroke. Body water is lost during exercise and must be replaced.  Work out until you breathe faster and your heart beats faster. This information is not intended to replace advice given to you by your health care provider. Make sure you discuss any questions you have with your health care provider. Document Released: 03/19/2010 Document Revised: 07/23/2015 Document Reviewed: 07/18/2013 Elsevier Interactive Patient Education  2018 [employer].   Vascular Dementia Dementia is a condition in which a person has problems with thinking, memory, and behavior that are severe enough to interfere with daily life. Vascular dementia is a  type of dementia. It results from brain damage that is caused by the brain not getting enough blood. Vascular dementia usually begins between 16 and 75 years of age. What are the causes? Vascular dementia is caused by conditions that lessen blood flow to the brain. Common causes include:  Multiple small strokes. These may happen without symptoms (silent stroke).  Major stroke.  Damage to small blood vessels in the brain (cerebral small vessel disease).  What increases the risk?  Advancing age.  Having had a stroke.  Having high blood pressure (hypertension) or high cholesterol.  Having a disease that affects the heart or blood vessels.  Smoking.  Having diabetes.  Being female.  Being obese.  Not being active.  Having depression. What are the signs or symptoms? Symptoms can vary a lot from one person to another. Symptoms may be mild or severe depending on the amount of damage and which parts of the brain have been affected. Symptoms may begin suddenly or may develop gradually.  Symptoms may remain stable, or they may get worse over time. Symptoms of vascular dementia may be similar to those of Alzheimer disease. The two conditions can occur together (mixed dementia). Symptoms of vascular dementia may include: Mental  Confusion.  Memory problems.  Poor attention and concentration.  Trouble understanding speech.  Depression.  Personality changes.  Trouble recognizing familiar people.  Agitation or aggression.  Paranoia.  Delusions or hallucinations. Physical  Weakness.  Poor balance.  Loss of bladder or bowel control (incontinence).  Unsteady walking (gait).  Speaking problems. Behavioral  Getting lost in familiar places.  Problems with planning and judgment.  Trouble following instructions.  Social problems.  Emotional outbursts.  Trouble with daily activities and self-care.  Problems handling money. How is this diagnosed? There is not a  specific test to diagnose vascular dementia. The health care provider will consider the person's medical history and symptoms or changes that are reported by friends and family. The health care provider will do a physical exam and may order lab tests or other tests that check brain and nervous system function. Tests that may be done include:  Blood tests.  Brain imaging tests.  Tests of movement, speech, and other daily activities (neurological exam).  Tests of memory, thinking, and problem-solving (neuropsychological or neurocognitive testing).  Diagnosis may involve several specialists. These may include a health care provider who specializes in the brain and nervous system (neurologist), a provider who specializes in disorders of the mind (psychiatrist), and a provider who focuses on speech and language changes (Electrical engineer). How is this treated? There is no cure for vascular dementia. Brain damage that has already occurred cannot be reversed. Treatment depends on:  How severe the condition is.  Which parts of the brain have been affected.  The person's overall health.  Treatment measures aim to:  Treat the underlying cause of vascular dementia and manage risk factors. This may include: ? Controlling blood pressure. ? Lowering cholesterol. ? Treating diabetes. ? Quitting smoking. ? Losing weight.  Manage symptoms.  Prevent further brain damage.  Improve the person's health and quality of life.  Treatment for dementia may involve a team of health care providers, including:  A neurologist.  A psychiatrist.  An occupational therapist.  A speech pathologist.  A cardiologist.  An exercise physiologist or physical therapist.  Follow these instructions at home: Home care for a person with vascular dementia depends on what caused the condition and how severe the symptoms are. General guidelines for care at home include:  Following the health care provider's  instructions for treating the condition that caused the dementia.  Using medicines only as told by the person's health care provider.  Creating a safe living space.  Learning ways to help the person remember people, appointments, and daily activities.  Finding a support group to help caregivers and family to cope with the effects of dementia.  Helping family and friends learn about ways to communicate with a person who has dementia.  Making sure the person keeps all follow-up visits and goes to all rehabilitation appointments as told by the health care team. This is important.  Contact a health care provider if:  A fever develops.  New behavioral problems develop.  Problems with swallowing develop.  Confusion gets worse.  Sleepiness gets worse. Get help right away if:  Loss of consciousness occurs.  There is a sudden loss of speech, balance, or thinking ability.  New numbness or paralysis occurs.  Sudden, severe headache occurs.  Vision is lost or suddenly gets worse in one or both eyes. This information is not intended to replace advice given to you by your health care provider. Make sure you discuss any questions you have with your health care provider. Document Released: 02/04/2002 Document Revised: 07/23/2015 Document Reviewed: 05/28/2014 Elsevier Interactive Patient Education  2018 [place].  Colesevelam oral suspension What is this medicine? COLESEVELAM (koh le SEV e lam) is used to lower cholesterol in children and adults. It is also used to lower blood sugar in adults with type 2 diabetes. This medicine should be used in combination with diet and exercise This medicine may be used for other purposes; ask your health care provider or pharmacist if you have questions. COMMON BRAND NAME(S): WelChol What should I tell my health care provider before I take this medicine? They need to know if you have any of these conditions: -constipation or bowel  obstruction -high triglyceride levels -history of pancreatitis caused by high triglyceride levels -phenylketonuria -an unusual or allergic reaction to colesevelam, other medicines, foods, dyes, or preservatives -pregnant or trying to get pregnant -breast-feeding How should I use this medicine? This medicine is mixed in water and taken by mouth. DO NOT take this medicine in the dry form. Follow the directions on the prescription label. Empty the granules into a glass and add 4 to 8 ounces of water, fruit juice, or diet soda. Stir well and drink. Take your medicine at regular intervals. Do not take it more often than directed. Talk to your pediatrician regarding the use of this medicine in children. While this drug may be prescribed for children as young as 10 years for selected conditions, precautions do apply. Overdosage: If you think you have taken too much of this medicine contact a poison control center or emergency room at once. NOTE: This medicine is only for you. Do not share this medicine with others. What if I miss a dose? If you miss a dose, take it as soon as you can with your next meal. If it is almost time for your next dose, take only that dose. Do not take double or extra doses. What may interact with this medicine? -birth control pills -cyclosporine -insulin -medicines for diabetes like glimepiride, glipizide, and glyburide -medicines for seizures like carbamazepine, phenobarbital, and phenytoin -metformin -olmesartan -thyroid hormones -verapamil -vitamins -warfarin This list may not describe all possible interactions. Give your health care provider a list of all the medicines, herbs, non-prescription drugs, or dietary supplements you use. Also tell them if you smoke, drink alcohol, or use illegal drugs. Some items may interact with your medicine. What should I watch for while using this medicine? Visit your doctor or health care professional for regular checks on your  progress. Your blood sugar and other tests will be measured regularly. This medicine is only part of a total cholesterol or blood sugar-lowering program. Your health care professional or dietician can suggest a low-cholesterol and low-fat diet that will reduce your risk of getting heart and blood vessel disease. Avoid alcohol and smoking, and keep a proper exercise schedule. To reduce the chance of getting constipated, drink plenty of water and increase the amount of fiber in your diet. Ask your doctor or health care professional for advice if you are constipated. If you are taking this medicine for diabetes, wear a medical ID bracelet or chain, and carry a card that describes your disease and details of your medicine and dosage times. What side effects may I notice from  receiving this medicine? Side effects that you should report to your doctor or health care professional as soon as possible: -allergic reactions like skin rash, itching or hives, swelling of the face, lips, or tongue -bloody or black, tarry stools -breathing problems -muscle pain -nausea, vomiting -severe stomach pain Side effects that usually do not require medical attention (report to your doctor or health care professional if they continue or are bothersome): -heartburn or indigestion -stomach upset This list may not describe all possible side effects. Call your doctor for medical advice about side effects. You may report side effects to FDA at 1-800-FDA-1088. Where should I keep my medicine? Keep out of the reach of children. Store at room temperature between 15 and 30 degrees C (59 and 86 degrees F). Protect from moisture. Throw away any unused medicine after the expiration date. NOTE: This sheet is a summary. It may not cover all possible information. If you have questions about this medicine, talk to your doctor, pharmacist, or health care provider.  2018 Elsevier/Gold Standard (2015-03-19 12:44:40)

## 2017-10-26 NOTE — Addendum Note (Signed)
Addended by: Vicie Mutters R on: 10/26/2017 12:07 PM   Modules accepted: Orders

## 2017-10-27 LAB — COMPLETE METABOLIC PANEL WITH GFR
AG Ratio: 2 (calc) (ref 1.0–2.5)
ALBUMIN MSPROF: 4.3 g/dL (ref 3.6–5.1)
ALKALINE PHOSPHATASE (APISO): 64 U/L (ref 33–130)
ALT: 14 U/L (ref 6–29)
AST: 15 U/L (ref 10–35)
BUN/Creatinine Ratio: 18 (calc) (ref 6–22)
BUN: 19 mg/dL (ref 7–25)
CO2: 30 mmol/L (ref 20–32)
CREATININE: 1.04 mg/dL — AB (ref 0.50–0.99)
Calcium: 9.5 mg/dL (ref 8.6–10.4)
Chloride: 104 mmol/L (ref 98–110)
GFR, EST AFRICAN AMERICAN: 63 mL/min/{1.73_m2} (ref >=60)
GFR, EST NON AFRICAN AMERICAN: 55 mL/min/{1.73_m2} — AB (ref >=60)
GLOBULIN: 2.1 g/dL (ref 1.9–3.7)
Glucose, Bld: 95 mg/dL (ref 65–99)
Potassium: 4.6 mmol/L (ref 3.5–5.3)
SODIUM: 141 mmol/L (ref 135–146)
TOTAL PROTEIN: 6.4 g/dL (ref 6.1–8.1)
Total Bilirubin: 0.7 mg/dL (ref 0.2–1.2)

## 2017-10-27 LAB — CBC WITH DIFFERENTIAL/PLATELET
BASOS ABS: 51 {cells}/uL (ref 0–200)
BASOS PCT: 1.1 %
EOS PCT: 3.9 %
Eosinophils Absolute: 179 cells/uL (ref 15–500)
HCT: 41.1 % (ref 35.0–45.0)
HEMOGLOBIN: 13.8 g/dL (ref 11.7–15.5)
Lymphs Abs: 1049 cells/uL (ref 850–3900)
MCH: 32.1 pg (ref 27.0–33.0)
MCHC: 33.6 g/dL (ref 32.0–36.0)
MCV: 95.6 fL (ref 80.0–100.0)
MPV: 10.6 fL (ref 7.5–12.5)
Monocytes Relative: 11.4 %
NEUTROS ABS: 2797 {cells}/uL (ref 1500–7800)
Neutrophils Relative %: 60.8 %
Platelets: 203 10*3/uL (ref 140–400)
RBC: 4.3 10*6/uL (ref 3.80–5.10)
RDW: 11.8 % (ref 11.0–15.0)
Total Lymphocyte: 22.8 %
WBC mixed population: 524 cells/uL (ref 200–950)
WBC: 4.6 10*3/uL (ref 3.8–10.8)

## 2017-10-27 LAB — VITAMIN D 25 HYDROXY (VIT D DEFICIENCY, FRACTURES): Vit D, 25-Hydroxy: 36 ng/mL (ref 30–100)

## 2017-10-27 LAB — TSH: TSH: 1.71 mIU/L (ref 0.40–4.50)

## 2017-10-27 LAB — LIPID PANEL
Cholesterol: 284 mg/dL — ABNORMAL HIGH (ref <200)
HDL: 87 mg/dL (ref >50)
LDL CHOLESTEROL (CALC): 173 mg/dL — AB
Non-HDL Cholesterol (Calc): 197 mg/dL (calc) — ABNORMAL HIGH (ref <130)
TRIGLYCERIDES: 114 mg/dL (ref <150)
Total CHOL/HDL Ratio: 3.3 (calc) (ref <5.0)

## 2017-10-27 LAB — MAGNESIUM: Magnesium: 2.1 mg/dL (ref 1.5–2.5)

## 2017-11-03 MED ORDER — AMPHETAMINE-DEXTROAMPHETAMINE 20 MG PO TABS: 20.0000 mg | ORAL_TABLET | Freq: Two times a day (BID) | ORAL | 0 refills | Status: DC

## 2017-12-17 ENCOUNTER — Other Ambulatory Visit: Payer: Self-pay | Admitting: Internal Medicine

## 2017-12-22 ENCOUNTER — Telehealth: Payer: Self-pay | Admitting: Physician Assistant

## 2017-12-22 MED ORDER — AMPHETAMINE-DEXTROAMPHETAMINE 20 MG PO TABS: 20.0000 mg | ORAL_TABLET | Freq: Two times a day (BID) | ORAL | 0 refills | Status: DC

## 2017-12-22 NOTE — Telephone Encounter (Signed)
-----   Message from Elenor Quinones, [place] sent at 12/21/2017  3:22 PM EDT ----- Regarding: refill PER PT/YELLOW NOTE:  Refill on [facility] Please & thank you!  Pharmacy: [facility]

## 2018-02-14 ENCOUNTER — Telehealth: Payer: Self-pay | Admitting: Physician Assistant

## 2018-02-14 MED ORDER — AMPHETAMINE-DEXTROAMPHETAMINE 20 MG PO TABS: 20.0000 mg | ORAL_TABLET | Freq: Two times a day (BID) | ORAL | 0 refills | Status: DC

## 2018-02-14 NOTE — Telephone Encounter (Signed)
-----   Message from Elenor Quinones, [place] sent at 02/13/2018 11:42 AM EST ----- Regarding: med refill PER PT/YELLOW NOTE:  Refill on [facility]DERALL Please & thank you!  Pharmacy: [facility]

## 2018-02-28 ENCOUNTER — Other Ambulatory Visit: Payer: Self-pay | Admitting: Physician Assistant

## 2018-02-28 DIAGNOSIS — E785 Hyperlipidemia, unspecified: Secondary | ICD-10-CM

## 2018-04-09 ENCOUNTER — Encounter: Payer: Self-pay | Admitting: Physician Assistant

## 2018-04-26 NOTE — Progress Notes (Signed)
CPE and follow up visit.  Assessment:   HTN, labile - continue medications, DASH diet, exercise and monitor at home. Call if greater than 130/80.    Hyperlipemia -continue medications, check lipids, decrease fatty foods, increase activity.  Try welchol  statin intolerant  Obesity Obesity with co morbidities- long discussion about weight loss, diet, and exercise   Attention deficit disorder (ADD) Continue adderall  Abnormal glucose Discussed general issues about diabetes pathophysiology and management., Educational material distributed., Suggested low cholesterol diet., Encouraged aerobic exercise., Discussed foot care., Reminded to get yearly retinal exam.   Anemia, unspecified anemia type - monitor, continue iron supp with Vitamin C and increase green leafy veggies   Allergy, subsequent encounter Continue OTC allergy pills  Depression, major, in remission ([place]) Remission Continue celexa  Ocular rosacea Follow up eye doctor   Osteopenia Get DEXA, continue vitamin D,Calcium  Gastroesophageal reflux disease, esophagitis presence not specified Continue PPI/H2 blocker, diet discussed  Vitamin D deficiency - VITAMIN D 25 Hydroxy (Vit-D Deficiency, Fractures)   Medication management - Magnesium  HSV infection Refill meds  Complete physical UTD   Future Appointments  Date Time Provider [place]  11/07/2018  9:00 AM Vicie Mutters, PA-C [facility] None  05/06/2019  2:00 PM Vicie Mutters, PA-C [facility] None      Subjective:   Kathy Cook is a 73 y.o. female who presents for CPE and 3 month follow up on hypertension, hyperlipidemia, vitamin D def.   She has had sinus issues, on claritin.  She has been trying to taper celexa but has been 1 year since her mother died and she has taken over POA, has been diagnosed schizophrenic and now in hospital, he is in [facility] so she is back up to 1 pill.   Her blood pressure has been controlled at home, today their  BP is BP: 128/84 She does workout, walks. She denies chest pain, shortness of breath, dizziness.  She is not on cholesterol medication, can not tolerate statins and could not tolerate zetia due to diarrhea.  Her cholesterol is not at goal. The cholesterol last visit was:   Lab Results  Component Value Date   CHOL 284 (H) 10/26/2017   HDL 87 10/26/2017   LDLCALC 173 (H) 10/26/2017   TRIG 114 10/26/2017   CHOLHDL 3.3 10/26/2017   Last A1C in the office was:  Lab Results  Component Value Date   HGBA1C 5.5 09/24/2015   Patient is on Vitamin D supplement. Lab Results  Component Value Date   VD25OH 36 10/26/2017   She does well on the Adderall, helps with concentration however she feels that when she takes drug holidays that she is very very tired.  Has HSV 1 and has famcyclovir for occ out break.  Mother 62 passed in April 2019, she has grief with this but feels that with her medications she is having a hard time processing.  Has been seeing Dr. Oneida Alar for bilateral feet OA, feeling better, has orthotics.  BMI is Body mass index is 35.12 kg/m., she is working on diet and exercise. Wt Readings from Last 3 Encounters:  04/30/18 192 lb (87.1 kg)  10/26/17 187 lb 3.2 oz (84.9 kg)  04/05/17 175 lb (79.4 kg)    Names of Other Physician/Practitioners you currently use: 1.  [facility]- here for primary care 2. [place], eye doctor, 04/2018 3.  Dr. Johnnye Sima, dentist, last visit q 6 months Patient Care Team: Unk Pinto, MD as PCP - General (  Internal Medicine) Dian Queen, MD as Consulting Physician (Obstetrics and Gynecology) Clent Jacks, MD as Consulting Physician (Ophthalmology) Lindwood Coke, MD as Consulting Physician (Dermatology)  Medication Review Current Outpatient Medications on File Prior to Visit  Medication Sig Dispense Refill  . acetaminophen-codeine (TYLENOL #3) 300-30 MG tablet Take 1-2 tablets by mouth every 8 (eight)  hours as needed for moderate pain. 60 tablet 0  . amphetamine-dextroamphetamine (ADDERALL) 20 MG tablet Take 1 tablet (20 mg total) by mouth 2 (two) times daily. 60 tablet 0  . buPROPion (WELLBUTRIN XL) 150 MG 24 hr tablet TAKE 1 TABLET BY MOUTH ONCE DAILY 90 tablet 1  . Cholecalciferol (VITAMIN D3) 5000 UNITS TABS Take 10,000 Units by mouth.    . citalopram (CELEXA) 20 MG tablet TAKE 1 TABLET BY MOUTH ONCE DAILY 90 tablet 1  . Colesevelam HCl 3.75 g PACK LET 1 PACKET SIT IN WATER OR ORANGE JUICE THEN MIX AGAIN BEFORE DRINKING ONCE DAILY 90 each 3  . cyclobenzaprine (FLEXERIL) 10 MG tablet Take 1 tablet (10 mg total) by mouth 3 (three) times daily as needed for muscle spasms. 30 tablet 0  . Estradiol-Estriol-Progesterone (BIEST/PROGESTERONE) CREA Place onto the skin. Marland Kitchen05 mg/ml cream to apply daily    . famciclovir (FAMVIR) 500 MG tablet Take 1 tablet (500 mg total) by mouth 2 (two) times daily. 60 tablet 0  . Omega-3 Fatty Acids (FISH OIL) 1000 MG CAPS Take by mouth daily.    . Progesterone Micronized 10 % CREA Place onto the skin. Patient applies 2 % cream daily     No current facility-administered medications on file prior to visit.     Current Problems (verified) Patient Active Problem List   Diagnosis Date Noted  . Metatarsal stress fracture, right, initial encounter 10/03/2016  . DJD (degenerative joint disease) 06/09/2016  . Labile hypertension 08/01/2014  . Vitamin D deficiency 08/01/2014  . Medication management 08/01/2014  . Obesity 07/04/2014  . Hyperlipemia 06/27/2013  . Attention deficit disorder (ADD)   . GERD (gastroesophageal reflux disease)   . Allergy   . Depression, major, in remission ([place])   . Anemia   . Ocular rosacea   . HSV infection   . Osteopenia     Screening Tests Immunization History  Administered Date(s) Administered  . DT 01/14/2015  . Influenza, High Dose Seasonal PF 04/30/2018  . Pneumococcal Conjugate-13 03/24/2015  . Pneumococcal-Unspecified  12/31/2012  . Td 03/29/2004    Preventative care: Last colonoscopy: 06/2017 last colonoscopy per Dr. Collene Mares Last mammogram: 02/2017  at [facility] this year Last pap smear/pelvic exam: 02/2016 at [facility] Dr. Tressia Danas DEXA 2019 getting next year at [facility]  Prior vaccinations: TD or Tdap: 2016  Influenza: TODAY Pneumococcal: 2014 Prevnar 13: 2017 Shingles/Zostavax: DUE  Allergies Allergies  Allergen Reactions  . Prednisone   . Statins     paralyzing  . Strattera [Atomoxetine Hcl]     Increased sadness    SURGICAL HISTORY She  has a past surgical history that includes Tonsillectomy and adenoidectomy and Skin biopsy. FAMILY HISTORY Her family history includes Depression in her mother; Diabetes in her father; Hypertension in her mother; Macular degeneration in her mother; Stroke in her father. SOCIAL HISTORY She  reports that she quit smoking about 38 years ago. Her smoking use included cigarettes. She has a 7.50 pack-year smoking history. She has never used smokeless tobacco. She reports current alcohol use. She reports that she does not use drugs.    Review of Systems  Constitutional: Negative.  HENT: Negative.   Eyes: Negative.   Respiratory: Negative.   Cardiovascular: Negative.   Gastrointestinal: Negative.   Genitourinary: Negative.   Musculoskeletal: Negative.   Skin: Negative.   Neurological: Positive for tingling (back worse with stress). Negative for dizziness, tremors, sensory change, speech change, focal weakness, seizures, loss of consciousness and headaches.     Objective:   Blood pressure 128/84, pulse 72, temperature (!) 97.5 F (36.4 C), height 5\' 2"  (1.575 m), weight 192 lb (87.1 kg), SpO2 94 %. Body mass index is 35.12 kg/m.  General appearance: alert, no distress, WD/WN,  female HEENT: normocephalic, sclerae anicteric, pupils dilated at this time due to recent eye exam TMs pearly, nares patent, no discharge or erythema, pharynx normal Oral cavity: MMM,  no lesions Neck: supple, no lymphadenopathy, no thyromegaly, no masses Heart: RRR, normal S1, S2, no murmurs Lungs: CTA bilaterally, no wheezes, rhonchi, or rales Abdomen: +bs, soft, non tender, non distended, no masses, no hepatomegaly, no splenomegaly Musculoskeletal: nontender, no swelling, no obvious deformity Extremities: no edema, no cyanosis, no clubbing Pulses: 2+ symmetric, upper and lower extremities, normal cap refill Neurological: alert, oriented x 3, CN2-12 intact, strength normal upper extremities and lower extremities, sensation normal throughout, DTRs 2+ throughout, no cerebellar signs, gait normal Psychiatric: normal affect, behavior normal, pleasant  Breast: defer Gyn: defer Rectal: defer    Vicie Mutters, PA-C   04/30/2018

## 2018-04-30 ENCOUNTER — Ambulatory Visit (INDEPENDENT_AMBULATORY_CARE_PROVIDER_SITE_OTHER): Payer: Medicare Other | Admitting: Physician Assistant

## 2018-04-30 ENCOUNTER — Encounter: Payer: Self-pay | Admitting: Physician Assistant

## 2018-04-30 VITALS — BP 128/84 | HR 72 | Temp 97.5°F | Ht 62.0 in | Wt 192.0 lb

## 2018-04-30 DIAGNOSIS — H43812 Vitreous degeneration, left eye: Secondary | ICD-10-CM | POA: Diagnosis not present

## 2018-04-30 DIAGNOSIS — Z23 Encounter for immunization: Secondary | ICD-10-CM

## 2018-04-30 DIAGNOSIS — K219 Gastro-esophageal reflux disease without esophagitis: Secondary | ICD-10-CM

## 2018-04-30 DIAGNOSIS — M150 Primary generalized (osteo)arthritis: Secondary | ICD-10-CM

## 2018-04-30 DIAGNOSIS — R0989 Other specified symptoms and signs involving the circulatory and respiratory systems: Secondary | ICD-10-CM | POA: Diagnosis not present

## 2018-04-30 DIAGNOSIS — Z0000 Encounter for general adult medical examination without abnormal findings: Secondary | ICD-10-CM | POA: Diagnosis not present

## 2018-04-30 DIAGNOSIS — L718 Other rosacea: Secondary | ICD-10-CM

## 2018-04-30 DIAGNOSIS — R7309 Other abnormal glucose: Secondary | ICD-10-CM

## 2018-04-30 DIAGNOSIS — D649 Anemia, unspecified: Secondary | ICD-10-CM

## 2018-04-30 DIAGNOSIS — T7840XD Allergy, unspecified, subsequent encounter: Secondary | ICD-10-CM

## 2018-04-30 DIAGNOSIS — M159 Polyosteoarthritis, unspecified: Secondary | ICD-10-CM

## 2018-04-30 DIAGNOSIS — Z79899 Other long term (current) drug therapy: Secondary | ICD-10-CM

## 2018-04-30 DIAGNOSIS — E785 Hyperlipidemia, unspecified: Secondary | ICD-10-CM

## 2018-04-30 DIAGNOSIS — F325 Major depressive disorder, single episode, in full remission: Secondary | ICD-10-CM

## 2018-04-30 DIAGNOSIS — E559 Vitamin D deficiency, unspecified: Secondary | ICD-10-CM

## 2018-04-30 DIAGNOSIS — Z136 Encounter for screening for cardiovascular disorders: Secondary | ICD-10-CM | POA: Diagnosis not present

## 2018-04-30 DIAGNOSIS — F988 Other specified behavioral and emotional disorders with onset usually occurring in childhood and adolescence: Secondary | ICD-10-CM

## 2018-04-30 DIAGNOSIS — M8580 Other specified disorders of bone density and structure, unspecified site: Secondary | ICD-10-CM

## 2018-04-30 DIAGNOSIS — Z0001 Encounter for general adult medical examination with abnormal findings: Secondary | ICD-10-CM

## 2018-04-30 DIAGNOSIS — H353131 Nonexudative age-related macular degeneration, bilateral, early dry stage: Secondary | ICD-10-CM | POA: Diagnosis not present

## 2018-04-30 DIAGNOSIS — H2513 Age-related nuclear cataract, bilateral: Secondary | ICD-10-CM | POA: Diagnosis not present

## 2018-04-30 DIAGNOSIS — M84374A Stress fracture, right foot, initial encounter for fracture: Secondary | ICD-10-CM

## 2018-04-30 MED ORDER — LORATADINE 10 MG PO TABS: 10.0000 mg | ORAL_TABLET | Freq: Every day | ORAL | 2 refills | Status: DC

## 2018-04-30 MED ORDER — CITALOPRAM HYDROBROMIDE 20 MG PO TABS: 20.0000 mg | ORAL_TABLET | Freq: Every day | ORAL | 1 refills | Status: DC

## 2018-04-30 MED ORDER — BUPROPION HCL ER (XL) 150 MG PO TB24: 150.0000 mg | ORAL_TABLET | Freq: Every day | ORAL | 1 refills | Status: DC

## 2018-04-30 MED ORDER — FAMCICLOVIR 500 MG PO TABS: 500.0000 mg | ORAL_TABLET | Freq: Two times a day (BID) | ORAL | 0 refills | Status: DC

## 2018-04-30 MED ORDER — AMPHETAMINE-DEXTROAMPHETAMINE 30 MG PO TABS: 30.0000 mg | ORAL_TABLET | Freq: Two times a day (BID) | ORAL | 0 refills | Status: DC

## 2018-04-30 NOTE — Patient Instructions (Addendum)
Get your mammogram  Your LDL could improve, ideally we want it under a 100.  Your LDL is the bad cholesterol that can lead to heart attack and stroke. To lower your number you can decrease your fatty foods, red meat, cheese, milk and increase fiber like whole grains and veggies. You can also add a fiber supplement like Citracel or Benefiber, these do not cause gas and bloating and are safe to use. Especially if you have a strong family history of heart disease or stroke or you have evidence of plaque on any imaging like a chest xray, we may discuss at your next office visit putting you on a medication to get your number below 100.   B12 is low end of normal, add sublingual B12. The sublingual matters more than the dose, get any dose but make sure it melts in your mouth.   Will help with energy, memory/concentration, decrease nerve pain, and help with weight loss. B12 is water soluble vitamin so you can not over dose on it,.       When it comes to diets, agreement about the perfect plan isn't easy to find, even among the experts. Experts at the [employer] developed an idea known as the Healthy Eating Plate. Just imagine a plate divided into logical, healthy portions.  The emphasis is on diet quality:  Load up on vegetables and fruits - one-half of your plate: Aim for color and variety, and remember that potatoes don't count.  Go for whole grains - one-quarter of your plate: Whole wheat, barley, wheat berries, quinoa, oats, brown rice, and foods made with them. If you want pasta, go with whole wheat pasta.  Protein power - one-quarter of your plate: Fish, chicken, beans, and nuts are all healthy, versatile protein sources. Limit red meat.  The diet, however, does go beyond the plate, offering a few other suggestions.  Use healthy plant oils, such as olive, canola, soy, corn, sunflower and peanut. Check the labels, and avoid partially hydrogenated oil, which have unhealthy  trans fats.  If you're thirsty, drink water. Coffee and tea are good in moderation, but skip sugary drinks and limit milk and dairy products to one or two daily servings.  The type of carbohydrate in the diet is more important than the amount. Some sources of carbohydrates, such as vegetables, fruits, whole grains, and beans-are healthier than others.  Finally, stay active.

## 2018-05-01 ENCOUNTER — Encounter: Payer: Self-pay | Admitting: Physician Assistant

## 2018-05-01 DIAGNOSIS — D649 Anemia, unspecified: Secondary | ICD-10-CM | POA: Diagnosis not present

## 2018-05-01 DIAGNOSIS — E559 Vitamin D deficiency, unspecified: Secondary | ICD-10-CM | POA: Diagnosis not present

## 2018-05-01 DIAGNOSIS — R0989 Other specified symptoms and signs involving the circulatory and respiratory systems: Secondary | ICD-10-CM | POA: Diagnosis not present

## 2018-05-01 DIAGNOSIS — E785 Hyperlipidemia, unspecified: Secondary | ICD-10-CM | POA: Diagnosis not present

## 2018-05-01 DIAGNOSIS — R7309 Other abnormal glucose: Secondary | ICD-10-CM | POA: Diagnosis not present

## 2018-05-01 LAB — COMPLETE METABOLIC PANEL WITH GFR
AG Ratio: 1.8 (calc) (ref 1.0–2.5)
ALT: 13 U/L (ref 6–29)
AST: 14 U/L (ref 10–35)
Albumin: 4.2 g/dL (ref 3.6–5.1)
Alkaline phosphatase (APISO): 66 U/L (ref 37–153)
BUN/Creatinine Ratio: 16 (calc) (ref 6–22)
BUN: 17 mg/dL (ref 7–25)
CO2: 25 mmol/L (ref 20–32)
Calcium: 9.5 mg/dL (ref 8.6–10.4)
Chloride: 107 mmol/L (ref 98–110)
Creat: 1.04 mg/dL — ABNORMAL HIGH (ref 0.60–0.93)
GFR, EST AFRICAN AMERICAN: 63 mL/min/{1.73_m2} (ref >=60)
GFR, Est Non African American: 54 mL/min/{1.73_m2} — ABNORMAL LOW (ref >=60)
Globulin: 2.4 g/dL (calc) (ref 1.9–3.7)
Glucose, Bld: 93 mg/dL (ref 65–99)
POTASSIUM: 4.3 mmol/L (ref 3.5–5.3)
Sodium: 141 mmol/L (ref 135–146)
Total Bilirubin: 0.3 mg/dL (ref 0.2–1.2)
Total Protein: 6.6 g/dL (ref 6.1–8.1)

## 2018-05-01 LAB — LIPID PANEL
Cholesterol: 263 mg/dL — ABNORMAL HIGH (ref <200)
HDL: 79 mg/dL (ref >=50)
LDL Cholesterol (Calc): 132 mg/dL (calc) — ABNORMAL HIGH
NON-HDL CHOLESTEROL (CALC): 184 mg/dL — AB (ref <130)
Total CHOL/HDL Ratio: 3.3 (calc) (ref <5.0)
Triglycerides: 364 mg/dL — ABNORMAL HIGH (ref <150)

## 2018-05-01 LAB — CBC WITH DIFFERENTIAL/PLATELET
Absolute Monocytes: 607 cells/uL (ref 200–950)
Basophils Absolute: 41 cells/uL (ref 0–200)
Basophils Relative: 0.6 %
Eosinophils Absolute: 207 cells/uL (ref 15–500)
Eosinophils Relative: 3 %
HCT: 41.3 % (ref 35.0–45.0)
Hemoglobin: 13.8 g/dL (ref 11.7–15.5)
Lymphs Abs: 1622 cells/uL (ref 850–3900)
MCH: 32.3 pg (ref 27.0–33.0)
MCHC: 33.4 g/dL (ref 32.0–36.0)
MCV: 96.7 fL (ref 80.0–100.0)
MPV: 10.7 fL (ref 7.5–12.5)
Monocytes Relative: 8.8 %
Neutro Abs: 4423 cells/uL (ref 1500–7800)
Neutrophils Relative %: 64.1 %
Platelets: 227 10*3/uL (ref 140–400)
RBC: 4.27 10*6/uL (ref 3.80–5.10)
RDW: 11.8 % (ref 11.0–15.0)
Total Lymphocyte: 23.5 %
WBC: 6.9 10*3/uL (ref 3.8–10.8)

## 2018-05-01 LAB — MAGNESIUM: Magnesium: 2.1 mg/dL (ref 1.5–2.5)

## 2018-05-01 LAB — HEMOGLOBIN A1C
Hgb A1c MFr Bld: 5.4 % of total Hgb (ref <5.7)
Mean Plasma Glucose: 108 (calc)
eAG (mmol/L): 6 (calc)

## 2018-05-01 LAB — VITAMIN D 25 HYDROXY (VIT D DEFICIENCY, FRACTURES): Vit D, 25-Hydroxy: 27 ng/mL — ABNORMAL LOW (ref 30–100)

## 2018-05-01 LAB — TSH: TSH: 2.15 mIU/L (ref 0.40–4.50)

## 2018-05-01 LAB — VITAMIN B12: Vitamin B-12: 300 pg/mL (ref 200–1100)

## 2018-05-01 MED ORDER — COLESEVELAM HCL 3.75 G PO PACK: PACK | ORAL | 3 refills | Status: DC

## 2018-05-02 LAB — MICROALBUMIN / CREATININE URINE RATIO
Creatinine, Urine: 80 mg/dL (ref 20–275)
Microalb Creat Ratio: 4 mcg/mg creat (ref <30)
Microalb, Ur: 0.3 mg/dL

## 2018-05-02 LAB — URINALYSIS, ROUTINE W REFLEX MICROSCOPIC
Bacteria, UA: NONE SEEN /HPF
Bilirubin Urine: NEGATIVE
Glucose, UA: NEGATIVE
HGB URINE DIPSTICK: NEGATIVE
Hyaline Cast: NONE SEEN /LPF
KETONES UR: NEGATIVE
NITRITE: NEGATIVE
Protein, ur: NEGATIVE
Specific Gravity, Urine: 1.015 (ref 1.001–1.03)
Squamous Epithelial / LPF: NONE SEEN /HPF (ref <=5)
WBC UA: NONE SEEN /HPF (ref 0–5)
pH: <=5 (ref 5.0–8.0)

## 2018-05-10 DIAGNOSIS — K219 Gastro-esophageal reflux disease without esophagitis: Secondary | ICD-10-CM | POA: Diagnosis not present

## 2018-05-10 DIAGNOSIS — R194 Change in bowel habit: Secondary | ICD-10-CM | POA: Diagnosis not present

## 2018-05-10 DIAGNOSIS — K600 Acute anal fissure: Secondary | ICD-10-CM | POA: Diagnosis not present

## 2018-06-22 ENCOUNTER — Other Ambulatory Visit: Payer: Self-pay | Admitting: Internal Medicine

## 2018-08-01 ENCOUNTER — Other Ambulatory Visit: Payer: Self-pay

## 2018-08-02 DIAGNOSIS — Z1231 Encounter for screening mammogram for malignant neoplasm of breast: Secondary | ICD-10-CM | POA: Diagnosis not present

## 2018-08-02 MED ORDER — AMPHETAMINE-DEXTROAMPHETAMINE 30 MG PO TABS: 30.0000 mg | ORAL_TABLET | Freq: Two times a day (BID) | ORAL | 0 refills | Status: DC

## 2018-08-13 DIAGNOSIS — N951 Menopausal and female climacteric states: Secondary | ICD-10-CM | POA: Diagnosis not present

## 2018-08-13 DIAGNOSIS — R61 Generalized hyperhidrosis: Secondary | ICD-10-CM | POA: Diagnosis not present

## 2018-08-13 LAB — HM MAMMOGRAPHY: HM Mammogram: NORMAL (ref 0–4)

## 2018-11-01 NOTE — Progress Notes (Deleted)
MEDICARE WELLNESS and follow up visit.  Assessment:   HTN, labile - continue medications, DASH diet, exercise and monitor at home. Call if greater than 130/80.    Hyperlipemia -continue medications, check lipids, decrease fatty foods, increase activity.  Try welchol  statin intolerant  Obesity Obesity with co morbidities- long discussion about weight loss, diet, and exercise   Attention deficit disorder (ADD) Continue adderall  Abnormal glucose Discussed general issues about diabetes pathophysiology and management., Educational material distributed., Suggested low cholesterol diet., Encouraged aerobic exercise., Discussed foot care., Reminded to get yearly retinal exam.   Anemia, unspecified anemia type - monitor, continue iron supp with Vitamin C and increase green leafy veggies   Allergy, subsequent encounter Continue OTC allergy pills  Depression, major, in remission ([place]) Remission Continue celexa  Ocular rosacea Follow up eye doctor   Osteopenia Get DEXA, continue vitamin D,Calcium  Gastroesophageal reflux disease, esophagitis presence not specified Continue PPI/H2 blocker, diet discussed  Vitamin D deficiency - VITAMIN D 25 Hydroxy (Vit-D Deficiency, Fractures)   Medication management - Magnesium  HSV infection Refill meds   Future Appointments  Date Time Provider [place]  11/07/2018  9:00 AM Vicie Mutters, PA-C [facility] None  05/06/2019  2:00 PM Vicie Mutters, PA-C [facility] None      Subjective:   Kathy Cook is a 73 y.o. female who presents for medicare wellness and 3 month follow up on hypertension, hyperlipidemia, vitamin D def.   She has had sinus issues, on claritin.  She has been trying to taper celexa but has been 1 year since her mother died and she has taken over POA, has been diagnosed schizophrenic and now in hospital, he is in [facility] so she is back up to 1 pill.   Her blood pressure has been controlled at home, today  their BP is   She does workout, walks. She denies chest pain, shortness of breath, dizziness.  She is not on cholesterol medication, can not tolerate statins and could not tolerate zetia due to diarrhea.  Her cholesterol is not at goal. The cholesterol last visit was:   Lab Results  Component Value Date   CHOL 263 (H) 04/30/2018   HDL 79 04/30/2018   LDLCALC 132 (H) 04/30/2018   TRIG 364 (H) 04/30/2018   CHOLHDL 3.3 04/30/2018   Last A1C in the office was:  Lab Results  Component Value Date   HGBA1C 5.4 04/30/2018   Patient is on Vitamin D supplement. Lab Results  Component Value Date   VD25OH 27 (L) 04/30/2018   She does well on the Adderall, helps with concentration however she feels that when she takes drug holidays that she is very very tired.  Has HSV 1 and has famcyclovir for occ out break.  Mother 7 passed in April 2019, she has grief with this but feels that with her medications she is having a hard time processing.  Has been seeing Dr. Oneida Alar for bilateral feet OA, feeling better, has orthotics.  BMI is There is no height or weight on file to calculate BMI., she is working on diet and exercise. Wt Readings from Last 3 Encounters:  04/30/18 192 lb (87.1 kg)  10/26/17 187 lb 3.2 oz (84.9 kg)  04/05/17 175 lb (79.4 kg)    Names of Other Physician/Practitioners you currently use: 1. [facility]- here for primary care 2. [place], eye doctor, 04/2018 3.  Dr. Johnnye Sima, dentist, last visit q 6 months Patient Care Team: Unk Pinto,  MD as PCP - General (Internal Medicine) Dian Queen, MD as Consulting Physician (Obstetrics and Gynecology) Clent Jacks, MD as Consulting Physician (Ophthalmology) Lindwood Coke, MD as Consulting Physician (Dermatology)  Medication Review  Current Outpatient Medications (Endocrine & Metabolic):  .  Estradiol-Estriol-Progesterone (BIEST/PROGESTERONE) CREA, Place onto the skin. Marland Kitchen05 mg/ml cream to  apply daily .  Progesterone Micronized 10 % CREA, Place onto the skin. Patient applies 2 % cream daily  Current Outpatient Medications (Cardiovascular):  Marland Kitchen  Colesevelam HCl 3.75 g PACK, LET 1 PACKET SIT IN WATER OR ORANGE JUICE THEN MIX AGAIN BEFORE DRINKING ONCE DAILY  Current Outpatient Medications (Respiratory):  .  loratadine (CLARITIN) 10 MG tablet, Take 1 tablet (10 mg total) by mouth daily.  Current Outpatient Medications (Analgesics):  .  acetaminophen-codeine (TYLENOL #3) 300-30 MG tablet, Take 1-2 tablets by mouth every 8 (eight) hours as needed for moderate pain.   Current Outpatient Medications (Other):  .  amphetamine-dextroamphetamine (ADDERALL) 30 MG tablet, Take 1 tablet by mouth 2 (two) times daily. Marland Kitchen  buPROPion (WELLBUTRIN XL) 150 MG 24 hr tablet, Take 1 tablet Daily for Mood .  Cholecalciferol (VITAMIN D3) 5000 UNITS TABS, Take 10,000 Units by mouth. .  citalopram (CELEXA) 20 MG tablet, Take 1 tablet (20 mg total) by mouth daily. .  cyclobenzaprine (FLEXERIL) 10 MG tablet, Take 1 tablet (10 mg total) by mouth 3 (three) times daily as needed for muscle spasms. .  famciclovir (FAMVIR) 500 MG tablet, Take 1 tablet (500 mg total) by mouth 2 (two) times daily. .  Omega-3 Fatty Acids (FISH OIL) 1000 MG CAPS, Take by mouth daily.  Current Problems (verified) Patient Active Problem List   Diagnosis Date Noted  . Metatarsal stress fracture, right, initial encounter 10/03/2016  . DJD (degenerative joint disease) 06/09/2016  . Labile hypertension 08/01/2014  . Vitamin D deficiency 08/01/2014  . Medication management 08/01/2014  . Obesity 07/04/2014  . Hyperlipemia 06/27/2013  . Attention deficit disorder (ADD)   . GERD (gastroesophageal reflux disease)   . Allergy   . Depression, major, in remission ([place])   . Anemia   . Ocular rosacea   . HSV infection   . Osteopenia     Screening Tests Immunization History  Administered Date(s) Administered  . DT 01/14/2015  .  Influenza, High Dose Seasonal PF 04/30/2018  . Pneumococcal Conjugate-13 03/24/2015  . Pneumococcal-Unspecified 12/31/2012  . Td 03/29/2004    Preventative care: Last colonoscopy: 06/2017 last colonoscopy per Dr. Collene Mares Last mammogram: 02/2017  at [facility] this year Last pap smear/pelvic exam: 02/2016 at [facility] Dr. Tressia Danas DEXA 2019 getting next year at [facility]  Prior vaccinations: TD or Tdap: 2016  Influenza: TODAY Pneumococcal: 2014 Prevnar 13: 2017 Shingles/Zostavax: DUE  Allergies Allergies  Allergen Reactions  . Prednisone   . Statins     paralyzing  . Strattera [Atomoxetine Hcl]     Increased sadness    SURGICAL HISTORY She  has a past surgical history that includes Tonsillectomy and adenoidectomy and Skin biopsy. FAMILY HISTORY Her family history includes Depression in her mother; Diabetes in her father; Hypertension in her mother; Macular degeneration in her mother; Stroke in her father. SOCIAL HISTORY She  reports that she quit smoking about 38 years ago. Her smoking use included cigarettes. She has a 7.50 pack-year smoking history. She has never used smokeless tobacco. She reports current alcohol use. She reports that she does not use drugs.  MEDICARE WELLNESS OBJECTIVES: Physical activity:   Cardiac risk factors:  Depression/mood screen:   Depression screen [facility] 2/9 10/26/2017  Decreased Interest 0  Down, Depressed, Hopeless 0  PHQ - 2 Score 0    ADLs:  No flowsheet data found.   Cognitive Testing  Alert? Yes  Normal Appearance?Yes  Oriented to person? Yes  Place? Yes   Time? Yes  Recall of three objects?  Yes  Can perform simple calculations? Yes  Displays appropriate judgment?Yes  Can read the correct time from a watch face?Yes  EOL planning:     Review of Systems  Constitutional: Negative.   HENT: Negative.   Eyes: Negative.   Respiratory: Negative.   Cardiovascular: Negative.   Gastrointestinal: Negative.   Genitourinary: Negative.    Musculoskeletal: Negative.   Skin: Negative.   Neurological: Positive for tingling (back worse with stress). Negative for dizziness, tremors, sensory change, speech change, focal weakness, seizures, loss of consciousness and headaches.     Objective:   There were no vitals taken for this visit. There is no height or weight on file to calculate BMI.  General appearance: alert, no distress, WD/WN,  female HEENT: normocephalic, sclerae anicteric, pupils dilated at this time due to recent eye exam TMs pearly, nares patent, no discharge or erythema, pharynx normal Oral cavity: MMM, no lesions Neck: supple, no lymphadenopathy, no thyromegaly, no masses Heart: RRR, normal S1, S2, no murmurs Lungs: CTA bilaterally, no wheezes, rhonchi, or rales Abdomen: +bs, soft, non tender, non distended, no masses, no hepatomegaly, no splenomegaly Musculoskeletal: nontender, no swelling, no obvious deformity Extremities: no edema, no cyanosis, no clubbing Pulses: 2+ symmetric, upper and lower extremities, normal cap refill Neurological: alert, oriented x 3, CN2-12 intact, strength normal upper extremities and lower extremities, sensation normal throughout, DTRs 2+ throughout, no cerebellar signs, gait normal Psychiatric: normal affect, behavior normal, pleasant  Breast: defer Gyn: defer Rectal: defer   Medicare Attestation I have personally reviewed: The patient's medical and social history Their use of alcohol, tobacco or illicit drugs Their current medications and supplements The patient's functional ability including ADLs,fall risks, home safety risks, cognitive, and hearing and visual impairment Diet and physical activities Evidence for depression or mood disorders  The patient's weight, height, BMI, and visual acuity have been recorded in the chart.  I have made referrals, counseling, and provided education to the patient based on review of the above and I have provided the patient with a  written personalized care plan for preventive services.     Vicie Mutters, PA-C   11/01/2018

## 2018-11-07 ENCOUNTER — Ambulatory Visit: Payer: Self-pay | Admitting: Physician Assistant

## 2018-11-08 NOTE — Progress Notes (Signed)
MEDICARE WELLNESS and follow up visit.  Assessment:   HTN, labile - continue medications, DASH diet, exercise and monitor at home. Call if greater than 130/80.    Hyperlipemia -continue medications, check lipids, decrease fatty foods, increase activity.  Try welchol  statin intolerant  Obesity Obesity with co morbidities- long discussion about weight loss, diet, and exercise   Attention deficit disorder (ADD) Continue adderall  Abnormal glucose Discussed general issues about diabetes pathophysiology and management., Educational material distributed., Suggested low cholesterol diet., Encouraged aerobic exercise., Discussed foot care., Reminded to get yearly retinal exam.   Anemia, unspecified anemia type - monitor, continue iron supp with Vitamin C and increase green leafy veggies   Allergy, subsequent encounter Continue OTC allergy pills  Depression, major, in remission ([place]) Remission Continue celexa  Ocular rosacea Follow up eye doctor   Osteopenia Get DEXA, continue vitamin D,Calcium  Gastroesophageal reflux disease, esophagitis presence not specified Continue PPI/H2 blocker, diet discussed  Vitamin D deficiency - VITAMIN D 25 Hydroxy (Vit-D Deficiency, Fractures)   Medication management - Magnesium  HSV infection Refill meds   Future Appointments  Date Time Provider [place]  05/06/2019  2:00 PM Vicie Mutters, PA-C [facility] None  11/18/2019  9:00 AM Vicie Mutters, PA-C [facility] None      Subjective:   Kathy Cook is a 73 y.o. female who presents for medicare wellness and 3 month follow up on hypertension, hyperlipidemia, vitamin D def.   She has been having right knee pain x 1 month, saw Dr. Lorin Mercy, had steroid shot but still having pain.  She states she has had increased sweating over the last few years, worse this last year. She will have sweating head, arms, and groin.   BMI is Body mass index is 35.12 kg/m., she is working on  diet and exercise. Wt Readings from Last 3 Encounters:  11/13/18 192 lb (87.1 kg)  11/09/18 190 lb (86.2 kg)  04/30/18 192 lb (87.1 kg)   She has had sinus issues, on claritin.  She has been trying to taper celexa but has been 1 year since her mother died and she has taken over POA of her brother, has been diagnosed schizophrenic and now in hospital, he is in [facility] so she is back up to 1 pill.   Her blood pressure has been controlled at home, today their BP is BP: 130/72 She does workout, walks. She denies chest pain, shortness of breath, dizziness.  She is not on cholesterol medication, can not tolerate statins and could not tolerate zetia due to diarrhea.  Her cholesterol is not at goal. The cholesterol last visit was:   Lab Results  Component Value Date   CHOL 263 (H) 04/30/2018   HDL 79 04/30/2018   LDLCALC 132 (H) 04/30/2018   TRIG 364 (H) 04/30/2018   CHOLHDL 3.3 04/30/2018   Last A1C in the office was:  Lab Results  Component Value Date   HGBA1C 5.4 04/30/2018   Patient is on Vitamin D supplement. Lab Results  Component Value Date   VD25OH 27 (L) 04/30/2018   She does well on the Adderall, helps with concentration however she feels that when she takes drug holidays that she is very very tired.  Has HSV 1 and has famcyclovir for occ out break.    Names of Other Physician/Practitioners you currently use: 1. [facility]- here for primary care 2. [place], eye doctor, 04/2018 3.  Dr. Johnnye Sima, dentist, last visit q 6 months  Patient Care Team: Unk Pinto, MD as PCP - General (Internal Medicine) Dian Queen, MD as Consulting Physician (Obstetrics and Gynecology) Clent Jacks, MD as Consulting Physician (Ophthalmology) Lindwood Coke, MD as Consulting Physician (Dermatology)  Medication Review  Current Outpatient Medications (Endocrine & Metabolic):  .  Estradiol-Estriol-Progesterone (BIEST/PROGESTERONE) CREA, Place onto the  skin. Marland Kitchen05 mg/ml cream to apply daily .  Progesterone Micronized 10 % CREA, Place onto the skin. Patient applies 2 % cream daily  Current Outpatient Medications (Cardiovascular):  Marland Kitchen  Colesevelam HCl 3.75 g PACK, LET 1 PACKET SIT IN WATER OR ORANGE JUICE THEN MIX AGAIN BEFORE DRINKING ONCE DAILY  Current Outpatient Medications (Respiratory):  .  loratadine (CLARITIN) 10 MG tablet, Take 1 tablet (10 mg total) by mouth daily.  Current Outpatient Medications (Analgesics):  .  acetaminophen-codeine (TYLENOL #3) 300-30 MG tablet, Take 1-2 tablets by mouth every 8 (eight) hours as needed for moderate pain.   Current Outpatient Medications (Other):  .  amphetamine-dextroamphetamine (ADDERALL) 30 MG tablet, Take 1 tablet by mouth 2 (two) times daily. .  Cholecalciferol (VITAMIN D3) 5000 UNITS TABS, Take 10,000 Units by mouth. .  cyclobenzaprine (FLEXERIL) 10 MG tablet, Take 1 tablet (10 mg total) by mouth 3 (three) times daily as needed for muscle spasms. .  famciclovir (FAMVIR) 500 MG tablet, Take 1 tablet (500 mg total) by mouth 2 (two) times daily. .  Omega-3 Fatty Acids (FISH OIL) 1000 MG CAPS, Take by mouth daily. Marland Kitchen  omeprazole (PRILOSEC) 40 MG capsule, Take 1 capsule (40 mg total) by mouth daily.  Current Problems (verified) Patient Active Problem List   Diagnosis Date Noted  . Metatarsal stress fracture, right, initial encounter 10/03/2016  . DJD (degenerative joint disease) 06/09/2016  . Labile hypertension 08/01/2014  . Vitamin D deficiency 08/01/2014  . Medication management 08/01/2014  . Obesity 07/04/2014  . Hyperlipemia 06/27/2013  . Attention deficit disorder (ADD)   . GERD (gastroesophageal reflux disease)   . Allergy   . Depression, major, in remission ([place])   . Anemia   . Ocular rosacea   . HSV infection   . Osteopenia     Screening Tests Immunization History  Administered Date(s) Administered  . DT 01/14/2015  . Influenza, High Dose Seasonal PF 04/30/2018,  11/13/2018  . Pneumococcal Conjugate-13 03/24/2015  . Pneumococcal-Unspecified 12/31/2012  . Td 03/29/2004    Preventative care: Last colonoscopy: 06/2017 last colonoscopy per Dr. Collene Mares Last mammogram: 07/2018  at [facility]  Last pap smear/pelvic exam: 2020 at [facility] Dr. Tressia Danas DEXA 2019 getting next year at [facility]  Prior vaccinations: TD or Tdap: 2016  Influenza: TODAY Pneumococcal: 2014 Prevnar 13: 2017 Shingles/Zostavax: DUE  Allergies Allergies  Allergen Reactions  . Prednisone   . Statins     paralyzing  . Strattera [Atomoxetine Hcl]     Increased sadness    SURGICAL HISTORY She  has a past surgical history that includes Tonsillectomy and adenoidectomy and Skin biopsy. FAMILY HISTORY Her family history includes Depression in her mother; Diabetes in her father; Hypertension in her mother; Macular degeneration in her mother; Stroke in her father. SOCIAL HISTORY She  reports that she quit smoking about 38 years ago. Her smoking use included cigarettes. She has a 7.50 pack-year smoking history. She has never used smokeless tobacco. She reports current alcohol use. She reports that she does not use drugs.  MEDICARE WELLNESS OBJECTIVES: Physical activity: Current Exercise Habits: Home exercise routine, Type of exercise: walking, Intensity: Mild Cardiac risk factors: Cardiac Risk Factors include:  advanced age (>78men, >71 women);dyslipidemia;hypertension;obesity (BMI >30kg/m2);sedentary lifestyle Depression/mood screen:   Depression screen [facility] 2/9 11/13/2018  Decreased Interest 0  Down, Depressed, Hopeless 0  PHQ - 2 Score 0    ADLs:  In your present state of health, do you have any difficulty performing the following activities: 11/13/2018  Hearing? N  Vision? N  Difficulty concentrating or making decisions? N  Walking or climbing stairs? N  Dressing or bathing? N  Doing errands, shopping? N  Some recent data might be hidden     Cognitive Testing  Alert? Yes  Normal  Appearance?Yes  Oriented to person? Yes  Place? Yes   Time? Yes  Recall of three objects?  Yes  Can perform simple calculations? Yes  Displays appropriate judgment?Yes  Can read the correct time from a watch face?Yes  EOL planning: Does Patient Have a Medical Advance Directive?: Yes Type of Advance Directive: Healthcare Power of Attorney, Living will Does patient want to make changes to medical advance directive?: No - Patient declined   Review of Systems  Constitutional: Negative.   HENT: Negative.   Eyes: Negative.   Respiratory: Negative.   Cardiovascular: Negative.   Gastrointestinal: Negative.   Genitourinary: Negative.   Musculoskeletal: Negative.   Skin: Negative.   Neurological: Positive for tingling (back worse with stress). Negative for dizziness, tremors, sensory change, speech change, focal weakness, seizures, loss of consciousness and headaches.     Objective:   Blood pressure 130/72, pulse 74, temperature (!) 97.4 F (36.3 C), height 5\' 2"  (1.575 m), weight 192 lb (87.1 kg), SpO2 99 %. Body mass index is 35.12 kg/m.  General appearance: alert, no distress, WD/WN,  female HEENT: normocephalic, sclerae anicteric, pupils dilated at this time due to recent eye exam TMs pearly, nares patent, no discharge or erythema, pharynx normal Oral cavity: MMM, no lesions Neck: supple, no lymphadenopathy, no thyromegaly, no masses Heart: RRR, normal S1, S2, no murmurs Lungs: CTA bilaterally, no wheezes, rhonchi, or rales Abdomen: +bs, soft, non tender, non distended, no masses, no hepatomegaly, no splenomegaly Musculoskeletal: nontender, no swelling, no obvious deformity Extremities: no edema, no cyanosis, no clubbing Pulses: 2+ symmetric, upper and lower extremities, normal cap refill Neurological: alert, oriented x 3, CN2-12 intact, strength normal upper extremities and lower extremities, sensation normal throughout, DTRs 2+ throughout, no cerebellar signs, gait  normal Psychiatric: normal affect, behavior normal, pleasant  Breast: defer Gyn: defer Rectal: defer   Medicare Attestation I have personally reviewed: The patient's medical and social history Their use of alcohol, tobacco or illicit drugs Their current medications and supplements The patient's functional ability including ADLs,fall risks, home safety risks, cognitive, and hearing and visual impairment Diet and physical activities Evidence for depression or mood disorders  The patient's weight, height, BMI, and visual acuity have been recorded in the chart.  I have made referrals, counseling, and provided education to the patient based on review of the above and I have provided the patient with a written personalized care plan for preventive services.     Vicie Mutters, PA-C   11/13/2018

## 2018-11-09 ENCOUNTER — Ambulatory Visit: Payer: Self-pay

## 2018-11-09 ENCOUNTER — Encounter: Payer: Self-pay | Admitting: Orthopaedic Surgery

## 2018-11-09 ENCOUNTER — Other Ambulatory Visit: Payer: Self-pay

## 2018-11-09 ENCOUNTER — Ambulatory Visit (INDEPENDENT_AMBULATORY_CARE_PROVIDER_SITE_OTHER): Payer: Medicare Other | Admitting: Orthopaedic Surgery

## 2018-11-09 VITALS — BP 149/82 | HR 68 | Ht 62.0 in | Wt 190.0 lb

## 2018-11-09 DIAGNOSIS — M25561 Pain in right knee: Secondary | ICD-10-CM

## 2018-11-09 DIAGNOSIS — M1711 Unilateral primary osteoarthritis, right knee: Secondary | ICD-10-CM

## 2018-11-09 NOTE — Progress Notes (Signed)
Office Visit Note   Patient: Kathy Cook           Date of Birth: 23-May-1945           MRN: [id number] Visit Date: 11/09/2018              Requested by: Unk Pinto, MD [address] [place],  [place] [address] PCP: Unk Pinto, MD   Assessment & Plan: Visit Diagnoses:  1. Acute pain of right knee   2. Unilateral primary osteoarthritis, right knee     Plan: Right knee injection performed for acute synovitis.  She got some improvement but is still limping postinjection.  She has persistent problems she will let us know we will need to consider MRI scan.  Her plain radiographs just show some mild osteoarthritis.  She will call if she develops acute locking.  Follow-Up Instructions: No follow-ups on file.   Orders:  Orders Placed This Encounter  Procedures   Large Joint Inj: R knee   XR Knee 1-2 Views Right   No orders of the defined types were placed in this encounter.     Procedures: Large Joint Inj: R knee on 11/09/2018 3:00 PM Indications: pain and joint swelling Details: 22 G 1.5 in needle, anterolateral approach  Arthrogram: No  Medications: 40 mg methylPREDNISolone acetate 40 MG/ML; 0.5 mL lidocaine 1 %; 4 mL bupivacaine 0.25 % Outcome: tolerated well, no immediate complications Procedure, treatment alternatives, risks and benefits explained, specific risks discussed. Consent was given by the patient. Immediately prior to procedure a time out was called to verify the correct patient, procedure, equipment, support staff and site/side marked as required. Patient was prepped and draped in the usual sterile fashion.       Clinical Data: No additional findings.   Subjective: Chief Complaint  Patient presents with   Right Knee - Pain    HPI 73 year old female with bilateral knee osteoarthritis when hiking [place] her [place] including [place] and had increased pain in her right knee with difficulty reaching full  extension amatory with a limp.  She is taken anti-inflammatories used ice and has not gotten relief.  After walking this weekend she states she could barely ambulate and has been using ice repetitively.  Past history of problems with oral nausea vomiting with oral steroids.  She denies history of gout.  Review of Systems 14 point review of systems positive for GERD depression, bilateral knee osteoarthritis, hypertension, vitamin D deficiency, ADD.  Negative for gout or pseudogout.  Objective: Vital Signs: BP (!) 149/82    Pulse 68    Ht 5\' 2"  (1.575 m)    Wt 190 lb (86.2 kg)    BMI 34.75 kg/m   Physical Exam Constitutional:      Appearance: [name].  HENT:     Head: Normocephalic.     Right Ear: External ear normal.     Left Ear: External ear normal.  Eyes:     Pupils: Pupils are equal, round, and reactive to light.  Neck:     Thyroid: No thyromegaly.     Trachea: No tracheal deviation.  Cardiovascular:     Rate and Rhythm: Normal rate.  Pulmonary:     Effort: Pulmonary effort is normal.  Abdominal:     Palpations: Abdomen is soft.  Skin:    General: Skin is warm and dry.  Neurological:     Mental Status: [name], place, and  time.  Psychiatric:        Behavior: Behavior normal.     Ortho Exam patient has negative logroll to the hips negative straight leg raising with negative popliteal compression test.  Right knee lacks 10 degrees reaching full extension with 2+ synovitis.  Collateral ligaments are stable.  Crepitus with knee flexion extension right and left knee.  Distal pulses are 2+ no pitting edema.  Ankle dorsiflexion plantarflexion is strong.  Increased pain more on the right than left knee with patellar compression and quadriceps contracture.  Crepitus with patellar loading and knee extension.  Specialty Comments:  No specialty comments available.  Imaging: No results found.   PMFS History: Patient Active Problem List    Diagnosis Date Noted   Metatarsal stress fracture, right, initial encounter 10/03/2016   DJD (degenerative joint disease) 06/09/2016   Labile hypertension 08/01/2014   Vitamin D deficiency 08/01/2014   Medication management 08/01/2014   Obesity 07/04/2014   Hyperlipemia 06/27/2013   Attention deficit disorder (ADD)    GERD (gastroesophageal reflux disease)    Allergy    Depression, major, in remission ([place])    Anemia    Ocular rosacea    HSV infection    Osteopenia    Past Medical History:  Diagnosis Date   Allergy    Anemia    Anxiety    Attention deficit disorder (ADD)    Cancer ([place])    ? basal/squam cell on chest   Depression    Dry eyes    GERD (gastroesophageal reflux disease)    HSV infection    Lumbar degenerative disc disease    Ocular rosacea    Osteopenia     Family History  Problem Relation Age of Onset   Stroke Father    Diabetes Father    Hypertension Mother    Depression Mother    Macular degeneration Mother     Past Surgical History:  Procedure Laterality Date   SKIN BIOPSY     ?basal/squam cell on chest   TONSILLECTOMY AND ADENOIDECTOMY     Social History   Occupational History   Not on file  Tobacco Use   Smoking status: Former Smoker    Packs/day: 0.50    Years: 15.00    Pack years: 7.50    Types: Cigarettes    Quit date: 03/23/1980    Years since quitting: 38.6   Smokeless tobacco: Never Used  Substance and Sexual Activity   Alcohol use: Yes    Alcohol/week: 0.0 standard drinks    Comment: social   Drug use: No   Sexual activity: Yes

## 2018-11-10 MED ORDER — LIDOCAINE HCL 1 % IJ SOLN
0.5000 mL | INTRAMUSCULAR | Status: AC | PRN
  Administered 2018-11-09: .5 mL

## 2018-11-10 MED ORDER — METHYLPREDNISOLONE ACETATE 40 MG/ML IJ SUSP
40.0000 mg | INTRAMUSCULAR | Status: AC | PRN
  Administered 2018-11-09: 40 mg via INTRA_ARTICULAR

## 2018-11-10 MED ORDER — BUPIVACAINE HCL 0.25 % IJ SOLN
4.0000 mL | INTRAMUSCULAR | Status: AC | PRN
  Administered 2018-11-09: 4 mL via INTRA_ARTICULAR

## 2018-11-13 ENCOUNTER — Encounter: Payer: Self-pay | Admitting: Physician Assistant

## 2018-11-13 ENCOUNTER — Ambulatory Visit (INDEPENDENT_AMBULATORY_CARE_PROVIDER_SITE_OTHER): Payer: Medicare Other | Admitting: Physician Assistant

## 2018-11-13 ENCOUNTER — Other Ambulatory Visit: Payer: Self-pay

## 2018-11-13 VITALS — BP 130/72 | HR 74 | Temp 97.4°F | Ht 62.0 in | Wt 192.0 lb

## 2018-11-13 DIAGNOSIS — Z6832 Body mass index (BMI) 32.0-32.9, adult: Secondary | ICD-10-CM

## 2018-11-13 DIAGNOSIS — Z79899 Other long term (current) drug therapy: Secondary | ICD-10-CM | POA: Diagnosis not present

## 2018-11-13 DIAGNOSIS — M159 Polyosteoarthritis, unspecified: Secondary | ICD-10-CM

## 2018-11-13 DIAGNOSIS — R6889 Other general symptoms and signs: Secondary | ICD-10-CM | POA: Diagnosis not present

## 2018-11-13 DIAGNOSIS — Z23 Encounter for immunization: Secondary | ICD-10-CM

## 2018-11-13 DIAGNOSIS — D649 Anemia, unspecified: Secondary | ICD-10-CM

## 2018-11-13 DIAGNOSIS — M150 Primary generalized (osteo)arthritis: Secondary | ICD-10-CM

## 2018-11-13 DIAGNOSIS — R0989 Other specified symptoms and signs involving the circulatory and respiratory systems: Secondary | ICD-10-CM

## 2018-11-13 DIAGNOSIS — Z0001 Encounter for general adult medical examination with abnormal findings: Secondary | ICD-10-CM | POA: Diagnosis not present

## 2018-11-13 DIAGNOSIS — E785 Hyperlipidemia, unspecified: Secondary | ICD-10-CM | POA: Diagnosis not present

## 2018-11-13 DIAGNOSIS — E6609 Other obesity due to excess calories: Secondary | ICD-10-CM | POA: Diagnosis not present

## 2018-11-13 DIAGNOSIS — M8580 Other specified disorders of bone density and structure, unspecified site: Secondary | ICD-10-CM

## 2018-11-13 DIAGNOSIS — E559 Vitamin D deficiency, unspecified: Secondary | ICD-10-CM

## 2018-11-13 DIAGNOSIS — L718 Other rosacea: Secondary | ICD-10-CM

## 2018-11-13 DIAGNOSIS — B009 Herpesviral infection, unspecified: Secondary | ICD-10-CM

## 2018-11-13 DIAGNOSIS — F325 Major depressive disorder, single episode, in full remission: Secondary | ICD-10-CM

## 2018-11-13 DIAGNOSIS — Z0000 Encounter for general adult medical examination without abnormal findings: Secondary | ICD-10-CM

## 2018-11-13 MED ORDER — OMEPRAZOLE 40 MG PO CPDR: 40.0000 mg | DELAYED_RELEASE_CAPSULE | Freq: Every day | ORAL | 1 refills | Status: DC

## 2018-11-13 MED ORDER — AMPHETAMINE-DEXTROAMPHETAMINE 30 MG PO TABS: 30.0000 mg | ORAL_TABLET | Freq: Two times a day (BID) | ORAL | 0 refills | Status: DC

## 2018-11-13 NOTE — Patient Instructions (Addendum)
Stop the wellbutrin and the celexa See if this helps with the sweating/hot flashes If not we can add on lexapro 10mg  for hot flashes and mood.   Check out  Mini habits for weight loss book  2 apps for tracking food is myfitness pal  loseit OR can take picture of your food   Google mindful eating and here are some tips and tricks below.   Rate your hunger before you eat on a scale of 1-10, try to eat closer to a 6 or higher. And if you are at below that, why are you eating? Slow down and listen to your body.        Ask insurance and pharmacy about shingrix - new vaccine   Can go to [employer]br for more information  Shingrix Vaccination  Two vaccines are licensed and recommended to prevent shingles in the [place] Zoster vaccine live (ZVL, Zostavax) has been in use since 2006. Recombinant zoster vaccine (RZV, Shingrix), has been in use since 2017 and is recommended by ACIP as the preferred shingles vaccine.  What Everyone Should Know about Shingles Vaccine (Shingrix) One of the Recommended Vaccines by Disease Shingles vaccination is the only way to protect against shingles and postherpetic neuralgia (PHN), the most common complication from shingles. CDC recommends that healthy adults 50 years and older get two doses of the shingles vaccine called Shingrix (recombinant zoster vaccine), separated by 2 to 6 months, to prevent shingles and the complications from the disease. Your doctor or pharmacist can give you Shingrix as a shot in your upper arm. Shingrix provides strong protection against shingles and PHN. Two doses of Shingrix is more than 90% effective at preventing shingles and PHN. Protection stays above 85% for at least the first four years after you get vaccinated. Shingrix is the preferred vaccine, over Zostavax (zoster vaccine live), a shingles vaccine in use since 2006. Zostavax may still be used to prevent shingles in  healthy adults 60 years and older. For example, you could use Zostavax if a person is allergic to Shingrix, prefers Zostavax, or requests immediate vaccination and Shingrix is unavailable. Who Should Get Shingrix? Healthy adults 50 years and older should get two doses of Shingrix, separated by 2 to 6 months. You should get Shingrix even if in the past you . had shingles  . received Zostavax  . are not sure if you had chickenpox There is no maximum age for getting Shingrix. If you had shingles in the past, you can get Shingrix to help prevent future occurrences of the disease. There is no specific length of time that you need to wait after having shingles before you can receive Shingrix, but generally you should make sure the shingles rash has gone away before getting vaccinated. You can get Shingrix whether or not you remember having had chickenpox in the past. Studies show that more than 99% of Americans 40 years and older have had chickenpox, even if they don't remember having the disease. Chickenpox and shingles are related because they are caused by the same virus (varicella zoster virus). After a person recovers from chickenpox, the virus stays dormant (inactive) in the body. It can reactivate years later and cause shingles. If you had Zostavax in the recent past, you should wait at least eight weeks before getting Shingrix. Talk to your healthcare provider to determine the best time to get Shingrix. Shingrix is available in [employer] and pharmacies. To find doctor's offices or pharmacies near you that offer the vaccine, visit  HealthMap Vaccine FinderExternal. If you have questions about Shingrix, talk with your healthcare provider. Vaccine for Those 56 Years and Older  Shingrix reduces the risk of shingles and PHN by more than 90% in people 89 and older. CDC recommends the vaccine for healthy adults 44 and older.  Who Should Not Get Shingrix? You should not get Shingrix if you: . have  ever had a severe allergic reaction to any component of the vaccine or after a dose of Shingrix  . tested negative for immunity to varicella zoster virus. If you test negative, you should get chickenpox vaccine.  . currently have shingles  . currently are pregnant or breastfeeding. Women who are pregnant or breastfeeding should wait to get Shingrix.  Marland Kitchen receive specific antiviral drugs (acyclovir, famciclovir, or valacyclovir) 24 hours before vaccination (avoid use of these antiviral drugs for 14 days after vaccination)- zoster vaccine live only If you have a minor acute (starts suddenly) illness, such as a cold, you may get Shingrix. But if you have a moderate or severe acute illness, you should usually wait until you recover before getting the vaccine. This includes anyone with a temperature of 101.2F or higher. The side effects of the Shingrix are temporary, and usually last 2 to 3 days. While you may experience pain for a few days after getting Shingrix, the pain will be less severe than having shingles and the complications from the disease. How Well Does Shingrix Work? Two doses of Shingrix provides strong protection against shingles and postherpetic neuralgia (PHN), the most common complication of shingles. . In adults 39 to 71 years old who got two doses, Shingrix was 97% effective in preventing shingles; among adults 70 years and older, Shingrix was 91% effective.  . In adults 50 to 71 years old who got two doses, Shingrix was 91% effective in preventing PHN; among adults 70 years and older, Shingrix was 89% effective. Shingrix protection remained high (more than 85%) in people 70 years and older throughout the four years following vaccination. Since your risk of shingles and PHN increases as you get older, it is important to have strong protection against shingles in your older years. Top of Page  What Are the Possible Side Effects of Shingrix? Studies show that Shingrix is safe. The vaccine  helps your body create a strong defense against shingles. As a result, you are likely to have temporary side effects from getting the shots. The side effects may affect your ability to do normal daily activities for 2 to 3 days. Most people got a sore arm with mild or moderate pain after getting Shingrix, and some also had redness and swelling where they got the shot. Some people felt tired, had muscle pain, a headache, shivering, fever, stomach pain, or nausea. About 1 out of 6 people who got Shingrix experienced side effects that prevented them from doing regular activities. Symptoms went away on their own in about 2 to 3 days. Side effects were more common in younger people. You might have a reaction to the first or second dose of Shingrix, or both doses. If you experience side effects, you may choose to take over-the-counter pain medicine such as ibuprofen or acetaminophen. If you experience side effects from Shingrix, you should report them to the Vaccine Adverse Event Reporting System (VAERS). Your doctor might file this report, or you can do it yourself through the VAERS websiteExternal, or by calling [phone]. If you have any questions about side effects from Shingrix, talk with your doctor.  The shingles vaccine does not contain thimerosal (a preservative containing mercury). Top of Page  When Should I See a Doctor Because of the Side Effects I Experience From Shingrix? In clinical trials, Shingrix was not associated with serious adverse events. In fact, serious side effects from vaccines are extremely rare. For example, for every 1 million doses of a vaccine given, only one or two people may have a severe allergic reaction. Signs of an allergic reaction happen within minutes or hours after vaccination and include hives, swelling of the face and throat, difficulty breathing, a fast heartbeat, dizziness, or weakness. If you experience these or any other life-threatening symptoms, see a doctor  right away. Shingrix causes a strong response in your immune system, so it may produce short-term side effects more intense than you are used to from other vaccines. These side effects can be uncomfortable, but they are expected and usually go away on their own in 2 or 3 days. Top of Page  How Can I Pay For Shingrix? There are several ways shingles vaccine may be paid for: Medicare . Medicare Part D plans cover the shingles vaccine, but there may be a cost to you depending on your plan. There may be a copay for the vaccine, or you may need to pay in full then get reimbursed for a certain amount.  . Medicare Part B does not cover the shingles vaccine. Medicaid . Medicaid may or may not cover the vaccine. Contact your insurer to find out. Private health insurance . Many private health insurance plans will cover the vaccine, but there may be a cost to you depending on your plan. Contact your insurer to find out. Vaccine assistance programs . Some pharmaceutical companies provide vaccines to eligible adults who cannot afford them. You may want to check with the vaccine manufacturer, [employer], about Shingrix. If you do not currently have health insurance, learn more about affordable health coverage optionsExternal. To find doctor's offices or pharmacies near you that offer the vaccine, visit HealthMap Vaccine FinderExternal.

## 2018-11-14 LAB — COMPLETE METABOLIC PANEL WITH GFR
AG Ratio: 2 (calc) (ref 1.0–2.5)
ALT: 12 U/L (ref 6–29)
AST: 14 U/L (ref 10–35)
Albumin: 4.5 g/dL (ref 3.6–5.1)
Alkaline phosphatase (APISO): 66 U/L (ref 37–153)
BUN: 19 mg/dL (ref 7–25)
CO2: 28 mmol/L (ref 20–32)
Calcium: 9.7 mg/dL (ref 8.6–10.4)
Chloride: 102 mmol/L (ref 98–110)
Creat: 0.92 mg/dL (ref 0.60–0.93)
GFR, Est African American: 73 mL/min/{1.73_m2} (ref >=60)
GFR, Est Non African American: 63 mL/min/{1.73_m2} (ref >=60)
Globulin: 2.3 g/dL (calc) (ref 1.9–3.7)
Glucose, Bld: 100 mg/dL — ABNORMAL HIGH (ref 65–99)
Potassium: 4.9 mmol/L (ref 3.5–5.3)
Sodium: 139 mmol/L (ref 135–146)
Total Bilirubin: 0.6 mg/dL (ref 0.2–1.2)
Total Protein: 6.8 g/dL (ref 6.1–8.1)

## 2018-11-14 LAB — CBC WITH DIFFERENTIAL/PLATELET
Absolute Monocytes: 570 cells/uL (ref 200–950)
Basophils Absolute: 37 cells/uL (ref 0–200)
Basophils Relative: 0.6 %
Eosinophils Absolute: 180 cells/uL (ref 15–500)
Eosinophils Relative: 2.9 %
HCT: 41 % (ref 35.0–45.0)
Hemoglobin: 13.5 g/dL (ref 11.7–15.5)
Lymphs Abs: 1265 cells/uL (ref 850–3900)
MCH: 32 pg (ref 27.0–33.0)
MCHC: 32.9 g/dL (ref 32.0–36.0)
MCV: 97.2 fL (ref 80.0–100.0)
MPV: 10.1 fL (ref 7.5–12.5)
Monocytes Relative: 9.2 %
Neutro Abs: 4148 cells/uL (ref 1500–7800)
Neutrophils Relative %: 66.9 %
Platelets: 218 10*3/uL (ref 140–400)
RBC: 4.22 10*6/uL (ref 3.80–5.10)
RDW: 11.8 % (ref 11.0–15.0)
Total Lymphocyte: 20.4 %
WBC: 6.2 10*3/uL (ref 3.8–10.8)

## 2018-11-14 LAB — LIPID PANEL
Cholesterol: 311 mg/dL — ABNORMAL HIGH (ref <200)
HDL: 100 mg/dL (ref >=50)
LDL Cholesterol (Calc): 186 mg/dL (calc) — ABNORMAL HIGH
Non-HDL Cholesterol (Calc): 211 mg/dL (calc) — ABNORMAL HIGH (ref <130)
Total CHOL/HDL Ratio: 3.1 (calc) (ref <5.0)
Triglycerides: 119 mg/dL (ref <150)

## 2018-11-14 LAB — TSH: TSH: 2.97 mIU/L (ref 0.40–4.50)

## 2018-11-14 LAB — MAGNESIUM: Magnesium: 2 mg/dL (ref 1.5–2.5)

## 2018-11-16 ENCOUNTER — Telehealth: Payer: Self-pay | Admitting: Orthopaedic Surgery

## 2018-11-16 DIAGNOSIS — M25561 Pain in right knee: Secondary | ICD-10-CM

## 2018-11-16 NOTE — Telephone Encounter (Signed)
Patient called stated she received a cortisone injection in the knee and does not feel better.  Please call patient to advise.  [phone]

## 2018-11-16 NOTE — Telephone Encounter (Signed)
Please advise. Patient had cortisone injection in knee on 11/09/2018.

## 2018-11-16 NOTE — Telephone Encounter (Signed)
Referral entered. I called patient and advised.

## 2018-11-16 NOTE — Telephone Encounter (Signed)
I talked with her last PM. Set her up for some therapy for her knee for a few weeks and if not better then we can order MRI.knee is some better.   eval and treat for chondromalacia and limping. Thanks

## 2018-11-20 ENCOUNTER — Ambulatory Visit: Payer: Medicare Other | Attending: Orthopaedic Surgery

## 2018-11-20 ENCOUNTER — Other Ambulatory Visit: Payer: Self-pay

## 2018-11-20 DIAGNOSIS — M6281 Muscle weakness (generalized): Secondary | ICD-10-CM | POA: Diagnosis not present

## 2018-11-20 DIAGNOSIS — R262 Difficulty in walking, not elsewhere classified: Secondary | ICD-10-CM | POA: Diagnosis not present

## 2018-11-20 DIAGNOSIS — M25561 Pain in right knee: Secondary | ICD-10-CM | POA: Insufficient documentation

## 2018-11-20 NOTE — Therapy (Signed)
[facility] [place], [place], [address] Phone: [phone]   Fax:  [phone]  Physical Therapy Evaluation  Patient Details  Name: Kathy Cook MRN: [id number] Date of Birth: 24-Mar-1945 Referring Provider (PT): Rodell Perna, MD   Encounter Date: 11/20/2018  PT End of Session - 11/20/18 1145    Visit Number  1    Number of Visits  12    Date for PT Re-Evaluation  12/28/18    Authorization Type  UHC MCR    PT Start Time  [id number]    PT Stop Time  1230    PT Time Calculation (min)  45 min    Activity Tolerance  Patient tolerated treatment well    Behavior During Therapy  [facility] for tasks assessed/performed       Past Medical History:  Diagnosis Date  . Allergy   . Anemia   . Anxiety   . Attention deficit disorder (ADD)   . Cancer [facility])    ? basal/squam cell on chest  . Depression   . Dry eyes   . GERD (gastroesophageal reflux disease)   . HSV infection   . Lumbar degenerative disc disease   . Ocular rosacea   . Osteopenia     Past Surgical History:  Procedure Laterality Date  . SKIN BIOPSY     ?basal/squam cell on chest  . TONSILLECTOMY AND ADENOIDECTOMY      There were no vitals filed for this visit.   Subjective Assessment - 11/20/18 1150    Subjective  She reports RT knee pain . Injection with cortisone with moderate decr pain.    She limps at times getting out of chair.  Pain for a 4  weeks.    Pertinent History  Lt foot fracture aches with cold weather. 2 years ago    How long can you sit comfortably?  As needed    How long can you stand comfortably?  varies   did home tasks for 1.5 hours  the other day    How long can you walk comfortably?  max 2 houses down    Diagnostic tests  xray : degenerative changes and spurs    Patient Stated Goals  She wants to quite hurting. so she can walk miles for exercises    Currently in Pain?  No/denies    Pain Score  0-No pain   Mid discomfort on walking in.    Pain Location  Knee    Pain Orientation  Right;Anterior    Pain Descriptors / Indicators  Sharp;Sore    Pain Type  --   sub acute   Pain Onset  1 to 4 weeks ago    Pain Frequency  Intermittent    Aggravating Factors   getting out of chair ,  walking and standing.    Pain Relieving Factors  get off feet. Tori Milks         [facility] PT Assessment - 11/20/18 0001      Assessment   Medical Diagnosis  RT knee pain    Referring Provider (PT)  Rodell Perna, MD    Onset Date/Surgical Date  --   4 weeks ago   Prior Therapy  no      Precautions   Precautions  None      Restrictions   Weight Bearing Restrictions  No      Balance Screen   Has the patient fallen in the past 6 months  No  Prior Function   Level of Independence  Independent      Cognition   Overall Cognitive Status  Within Functional Limits for tasks assessed      Posture/Postural Control   Posture/Postural Control  No significant limitations      ROM / Strength   AROM / PROM / Strength  AROM;Strength      AROM   AROM Assessment Site  Knee    Right/Left Knee  Right;Left    Right Knee Extension  0    Right Knee Flexion  135    Left Knee Extension  0    Left Knee Flexion  135      Strength   Strength Assessment Site  Knee    Right/Left Knee  Right;Left    Right Knee Flexion  5/5    Right Knee Extension  5/5    Left Knee Flexion  5/5    Left Knee Extension  5/5      Flexibility   Soft Tissue Assessment /Muscle Length  yes    Hamstrings  70 degrees bilaterally      Normal gait pattern today          Objective measurements completed on examination: See above findings.              PT Education - 11/20/18 1157    Education Details  POC, HEP    Person(s) Educated  Patient    Methods  Explanation    Comprehension  Verbalized understanding       PT Short Term Goals - 11/20/18 1246      PT SHORT TERM GOAL #1   Title  she will be indpendent with initial HEP    Time  2    Period   Weeks    Status  New      PT SHORT TERM GOAL #2   Title  She will report pain decreased 30% or more with walking and able to walk at least 4 houses  before pain incr    Time  3    Period  Weeks    Status  New        PT Long Term Goals - 11/20/18 1247      PT LONG TERM GOAL #1   Title  She will be indpendent with all HEP issued.    Time  6    Period  Weeks    Status  New      PT LONG TERM GOAL #2   Title  RT knee pan will be decr by 75%  with wlaking 1/2 mile or more    Time  6    Period  Weeks    Status  New      PT LONG TERM GOAL #3   Title  She will report getting out of chair with no pain or limp on walking    Time  6    Period  Weeks    Status  New      PT LONG TERM GOAL #4   Title  FOTO score will decr from 53% to 35% limited to demo percieved improved function    Time  6    Period  Weeks    Status  New      PT LONG TERM GOAL #5   Title  She will report able to walk 12 steps without increased pain.    Time  6    Period  Weeks    Status  New             Plan - 11/20/18 1146    Clinical Impression Statement  [name] presents with RT knee pain that is intermittant more with getting out of chair and being on feet. Today pain was on the RT joint line and patella.  She was not comfortable in full extension or flexion.   She had  no limp walking.  She should do well with skilled PT and consistent HEP    Examination-Activity Limitations  Locomotion Level;Transfers;Stairs;Bend    Examination-Participation Restrictions  Community Activity;Cleaning    Stability/Clinical Decision Making  Stable/Uncomplicated    Clinical Decision Making  Low    Rehab Potential  Good    PT Frequency  2x / week    PT Duration  6 weeks    PT Treatment/Interventions  Taping;Patient/family education;Therapeutic activities;Therapeutic exercise;Ultrasound;Cryotherapy;Electrical Stimulation;Iontophoresis 4mg /ml Dexamethasone;Manual techniques    PT Next Visit Plan  Modalites , Hip and  quad strength, ionto or taping.    PT Home Exercise Plan  QS,  SLR with ER, hp abduciton    Consulted and Agree with Plan of Care  Patient       Patient will benefit from skilled therapeutic intervention in order to improve the following deficits and impairments:  Pain, Difficulty walking, Decreased activity tolerance, Decreased strength  Visit Diagnosis: Right knee pain, unspecified chronicity  Muscle weakness (generalized)  Difficulty in walking, not elsewhere classified     Problem List Patient Active Problem List   Diagnosis Date Noted  . Metatarsal stress fracture, right, initial encounter 10/03/2016  . DJD (degenerative joint disease) 06/09/2016  . Labile hypertension 08/01/2014  . Vitamin D deficiency 08/01/2014  . Medication management 08/01/2014  . Obesity 07/04/2014  . Hyperlipemia 06/27/2013  . Attention deficit disorder (ADD)   . GERD (gastroesophageal reflux disease)   . Allergy   . Depression, major, in remission ([place])   . Anemia   . Ocular rosacea   . HSV infection   . Marcine Matar  PT 11/20/2018, 12:55 PM  [facility] [address] [place], [place], [address] Phone: [phone]   Fax:  [phone]  Name: Kathy Cook MRN: [id number] Date of Birth: September 10, 1945

## 2018-11-20 NOTE — Patient Instructions (Addendum)
Side lye hip abduction    Quads sets    SLR with foot ER    2x0 reps daily with hole time of 5 sec

## 2018-11-22 ENCOUNTER — Encounter: Payer: Self-pay | Admitting: Physical Therapy

## 2018-11-22 ENCOUNTER — Other Ambulatory Visit: Payer: Self-pay

## 2018-11-22 ENCOUNTER — Ambulatory Visit: Payer: Medicare Other | Admitting: Physical Therapy

## 2018-11-22 DIAGNOSIS — M6281 Muscle weakness (generalized): Secondary | ICD-10-CM

## 2018-11-22 DIAGNOSIS — R262 Difficulty in walking, not elsewhere classified: Secondary | ICD-10-CM

## 2018-11-22 DIAGNOSIS — M25561 Pain in right knee: Secondary | ICD-10-CM | POA: Diagnosis not present

## 2018-11-22 NOTE — Therapy (Signed)
[place], [place], [address] Phone: [phone]   Fax:  [phone]  Physical Therapy Treatment  Patient Details  Name: Kathy Cook MRN: [id number] Date of Birth: [birth year] Referring Provider (PT): Rodell Perna, MD   Encounter Date: 11/22/2018  PT End of Session - 11/22/18 1240    Visit Number  2    Number of Visits  12    Date for PT Re-Evaluation  12/28/18    Authorization Type  UHC MCR    PT Start Time  [id number]    PT Stop Time  [id number]    PT Time Calculation (min)  38 min       Past Medical History:  Diagnosis Date  . Allergy   . Anemia   . Anxiety   . Attention deficit disorder (ADD)   . Cancer [facility])    ? basal/squam cell on chest  . Depression   . Dry eyes   . GERD (gastroesophageal reflux disease)   . HSV infection   . Lumbar degenerative disc disease   . Ocular rosacea   . Osteopenia     Past Surgical History:  Procedure Laterality Date  . SKIN BIOPSY     ?basal/squam cell on chest  . TONSILLECTOMY AND ADENOIDECTOMY      There were no vitals filed for this visit.  Subjective Assessment - 11/22/18 1242    Subjective  Discomfort today, not pain.    Currently in Pain?  Yes    Pain Score  4     Pain Location  Knee    Pain Orientation  Right    Pain Descriptors / Indicators  Discomfort                       [facility] Adult PT Treatment/Exercise - 11/22/18 0001      Knee/Hip Exercises: Stretches   Active Hamstring Stretch  3 reps;30 seconds      Knee/Hip Exercises: Aerobic   Nustep  L5 UE/LE x 5 minutes       Knee/Hip Exercises: Supine   Quad Sets  15 reps    Short Arc [employer]  15 reps    Short Arc Quad Sets Limitations  with ball squeeze     Heel Slides  10 reps    Heel Slides Limitations  3 reps with strap for stretch    Bridges with [employer]  10 reps    Straight Leg Raise with External Rotation  15 reps    Other Supine Knee/Hip Exercises  ball  queeze       Knee/Hip Exercises: Sidelying   Hip ABduction  Right;15 reps      Modalities   Modalities  Iontophoresis      Iontophoresis   Type of Iontophoresis  Dexamethasone    Location  Right medial/distal knee    Dose  1 ml    Time  6 hour patch       Manual Therapy   Manual therapy comments  massage roller to right quad               PT Short Term Goals - 11/20/18 1246      PT SHORT TERM GOAL #1   Title  she will be indpendent with initial HEP    Time  2    Period  Weeks    Status  New      PT SHORT TERM GOAL #2   Title  She will report pain decreased 30% or more with walking and able to walk at least 4 houses  before pain incr    Time  3    Period  Weeks    Status  New        PT Long Term Goals - 11/20/18 1247      PT LONG TERM GOAL #1   Title  She will be indpendent with all HEP issued.    Time  6    Period  Weeks    Status  New      PT LONG TERM GOAL #2   Title  RT knee pan will be decr by 75%  with wlaking 1/2 mile or more    Time  6    Period  Weeks    Status  New      PT LONG TERM GOAL #3   Title  She will report getting out of chair with no pain or limp on walking    Time  6    Period  Weeks    Status  New      PT LONG TERM GOAL #4   Title  FOTO score will decr from 53% to 35% limited to demo percieved improved function    Time  6    Period  Weeks    Status  New      PT LONG TERM GOAL #5   Title  She will report able to walk 12 steps without increased pain.    Time  6    Period  Weeks    Status  New            Plan - 11/22/18 1242    Clinical Impression Statement  Pt reports awkwardness and no improvement with KT tape so we removed it today. Reviewed HEP. Continued with quad activation and hip strength. Massage roller to right quad revealed tenderness in VMO/ITB. Trial of ionto to medial knee today. She had no pain at end of session.    PT Next Visit Plan  Modalites , Hip and quad strength, assess ionto,  taping.    PT  Home Exercise Plan  QS,  SLR with ER, hp abduciton       Patient will benefit from skilled therapeutic intervention in order to improve the following deficits and impairments:  Pain, Difficulty walking, Decreased activity tolerance, Decreased strength  Visit Diagnosis: Right knee pain, unspecified chronicity  Muscle weakness (generalized)  Difficulty in walking, not elsewhere classified     Problem List Patient Active Problem List   Diagnosis Date Noted  . Metatarsal stress fracture, right, initial encounter 10/03/2016  . DJD (degenerative joint disease) 06/09/2016  . Labile hypertension 08/01/2014  . Vitamin D deficiency 08/01/2014  . Medication management 08/01/2014  . Obesity 07/04/2014  . Hyperlipemia 06/27/2013  . Attention deficit disorder (ADD)   . GERD (gastroesophageal reflux disease)   . Allergy   . Depression, major, in remission ([place])   . Anemia   . Ocular rosacea   . HSV infection   . Osteopenia     Dorene Ar, [place] 11/22/2018, 2:13 PM  [facility] [address] [place], [place], [address] Phone: [phone]   Fax:  [phone]  Name: Kathy Cook MRN: [id number] Date of Birth: 1945/09/15

## 2018-11-26 ENCOUNTER — Other Ambulatory Visit: Payer: Self-pay

## 2018-11-26 ENCOUNTER — Encounter: Payer: Self-pay | Admitting: Physical Therapy

## 2018-11-26 ENCOUNTER — Ambulatory Visit: Payer: Medicare Other | Admitting: Physical Therapy

## 2018-11-26 DIAGNOSIS — R262 Difficulty in walking, not elsewhere classified: Secondary | ICD-10-CM | POA: Diagnosis not present

## 2018-11-26 DIAGNOSIS — M25561 Pain in right knee: Secondary | ICD-10-CM | POA: Diagnosis not present

## 2018-11-26 DIAGNOSIS — M6281 Muscle weakness (generalized): Secondary | ICD-10-CM | POA: Diagnosis not present

## 2018-11-26 NOTE — Therapy (Signed)
[place], [place], [address] Phone: [phone]   Fax:  [phone]  Physical Therapy Treatment  Patient Details  Name: Kathy Cook MRN: [id number] Date of Birth: 03/22/1945 Referring Provider (PT): Rodell Perna, MD   Encounter Date: 11/26/2018  PT End of Session - 11/26/18 1409    Visit Number  3    Number of Visits  12    Date for PT Re-Evaluation  12/28/18    Authorization Type  UHC MCR    PT Start Time  1400    PT Stop Time  1445    PT Time Calculation (min)  45 min    Activity Tolerance  Patient tolerated treatment well    Behavior During Therapy  [facility] for tasks assessed/performed       Past Medical History:  Diagnosis Date  . Allergy   . Anemia   . Anxiety   . Attention deficit disorder (ADD)   . Cancer [facility])    ? basal/squam cell on chest  . Depression   . Dry eyes   . GERD (gastroesophageal reflux disease)   . HSV infection   . Lumbar degenerative disc disease   . Ocular rosacea   . Osteopenia     Past Surgical History:  Procedure Laterality Date  . SKIN BIOPSY     ?basal/squam cell on chest  . TONSILLECTOMY AND ADENOIDECTOMY      There were no vitals filed for this visit.  Subjective Assessment - 11/26/18 1407    Subjective  Pt arriving to therapy reporting 2/10 achiness in her right knee. Pt reporting she feels it's a little sore today after being in a car for 4 hours coming back from beach.    Pertinent History  Lt foot fracture aches with cold weather. 2 years ago    How long can you sit comfortably?  As needed    How long can you stand comfortably?  varies   did home tasks for 1.5 hours  the other day    How long can you walk comfortably?  max 2 houses down    Diagnostic tests  xray : degenerative changes and spurs    Patient Stated Goals  She wants to quite hurting. so she can walk miles for exercises    Currently in Pain?  Yes    Pain Score  2     Pain Location   Knee    Pain Orientation  Right                       [facility] Adult PT Treatment/Exercise - 11/26/18 0001      Knee/Hip Exercises: Stretches   Active Hamstring Stretch  3 reps;30 seconds    Active Hamstring Stretch Limitations  Verbal instructions to keep knee straight      Knee/Hip Exercises: Aerobic   Nustep  L5 UE/LE x 6 minutes       Knee/Hip Exercises: Supine   Quad Sets  15 reps    Short Arc [employer]  15 reps    Short Arc Quad Sets Limitations  ball squeezes    Heel Slides  15 reps    Bridges with [employer]  10 reps    Straight Leg Raise with External Rotation  15 reps    Other Supine Knee/Hip Exercises  ball squeezes knees bent x 15 reps      Modalities   Modalities  Iontophoresis  Iontophoresis   Type of Iontophoresis  Dexamethasone    Location  Right medial/distal knee    Dose  1 ml    Time  6 hour patch                PT Short Term Goals - 11/26/18 1427      PT SHORT TERM GOAL #1   Title  she will be indpendent with initial HEP    Time  2    Period  Weeks    Status  On-going      PT SHORT TERM GOAL #2   Title  She will report pain decreased 30% or more with walking and able to walk at least 4 houses  before pain incr    Time  3    Period  Weeks    Status  On-going        PT Long Term Goals - 11/20/18 1247      PT LONG TERM GOAL #1   Title  She will be indpendent with all HEP issued.    Time  6    Period  Weeks    Status  New      PT LONG TERM GOAL #2   Title  RT knee pan will be decr by 75%  with wlaking 1/2 mile or more    Time  6    Period  Weeks    Status  New      PT LONG TERM GOAL #3   Title  She will report getting out of chair with no pain or limp on walking    Time  6    Period  Weeks    Status  New      PT LONG TERM GOAL #4   Title  FOTO score will decr from 53% to 35% limited to demo percieved improved function    Time  6    Period  Weeks    Status  New      PT LONG TERM GOAL #5   Title  She  will report able to walk 12 steps without increased pain.    Time  6    Period  Weeks    Status  New            Plan - 11/26/18 1410    Clinical Impression Statement  Pt reporting 2/10 pain at start of session. Pt also reporting the ionto patch really helped. Pt stated she has started using pain cream which has helped. Pt tolerating walking more. Pt still progressing with core strengtheing and R LE strengthening. Continue skilled PT to progress toward goals set.    Examination-Activity Limitations  Locomotion Level;Transfers;Stairs;Bend    Examination-Participation Restrictions  Community Activity;Cleaning    Stability/Clinical Decision Making  Stable/Uncomplicated    Rehab Potential  Good    PT Frequency  2x / week    PT Duration  6 weeks    PT Treatment/Interventions  Taping;Patient/family education;Therapeutic activities;Therapeutic exercise;Ultrasound;Cryotherapy;Electrical Stimulation;Iontophoresis 4mg /ml Dexamethasone;Manual techniques    PT Next Visit Plan  Modalites , Hip and quad strength, assess ionto,  taping.    PT Home Exercise Plan  QS,  SLR with ER, hp abduciton       Patient will benefit from skilled therapeutic intervention in order to improve the following deficits and impairments:  Pain, Difficulty walking, Decreased activity tolerance, Decreased strength  Visit Diagnosis: Right knee pain, unspecified chronicity  Muscle weakness (generalized)  Difficulty in walking, not elsewhere classified  Problem List Patient Active Problem List   Diagnosis Date Noted  . Metatarsal stress fracture, right, initial encounter 10/03/2016  . DJD (degenerative joint disease) 06/09/2016  . Labile hypertension 08/01/2014  . Vitamin D deficiency 08/01/2014  . Medication management 08/01/2014  . Obesity 07/04/2014  . Hyperlipemia 06/27/2013  . Attention deficit disorder (ADD)   . GERD (gastroesophageal reflux disease)   . Allergy   . Depression, major, in remission  ([place])   . Anemia   . Ocular rosacea   . HSV infection   . Osteopenia     Oretha Caprice, PT 11/26/2018, 2:56 PM  [facility] [address] [place], [place], [address] Phone: [phone]   Fax:  [phone]  Name: IRMALEE MOUND MRN: [id number] Date of Birth: 26-Apr-1945

## 2018-11-29 ENCOUNTER — Ambulatory Visit: Payer: Medicare Other | Attending: Orthopaedic Surgery

## 2018-11-29 ENCOUNTER — Other Ambulatory Visit: Payer: Self-pay

## 2018-11-29 DIAGNOSIS — M25561 Pain in right knee: Secondary | ICD-10-CM | POA: Diagnosis not present

## 2018-11-29 DIAGNOSIS — M6281 Muscle weakness (generalized): Secondary | ICD-10-CM | POA: Insufficient documentation

## 2018-11-29 DIAGNOSIS — R262 Difficulty in walking, not elsewhere classified: Secondary | ICD-10-CM

## 2018-11-29 NOTE — Therapy (Signed)
[place], [place], [address] Phone: [phone]   Fax:  [phone]  Physical Therapy Treatment  Patient Details  Name: Kathy Cook MRN: [id number] Date of Birth: [birth year] Referring Provider (PT): Rodell Perna, MD   Encounter Date: 11/29/2018  PT End of Session - 11/29/18 1130    Visit Number  4    Number of Visits  12    Date for PT Re-Evaluation  12/28/18    Authorization Type  UHC MCR    PT Start Time  1130    PT Stop Time  1210    PT Time Calculation (min)  40 min    Activity Tolerance  Patient tolerated treatment well    Behavior During Therapy  [facility] for tasks assessed/performed       Past Medical History:  Diagnosis Date  . Allergy   . Anemia   . Anxiety   . Attention deficit disorder (ADD)   . Cancer [facility])    ? basal/squam cell on chest  . Depression   . Dry eyes   . GERD (gastroesophageal reflux disease)   . HSV infection   . Lumbar degenerative disc disease   . Ocular rosacea   . Osteopenia     Past Surgical History:  Procedure Laterality Date  . SKIN BIOPSY     ?basal/squam cell on chest  . TONSILLECTOMY AND ADENOIDECTOMY      There were no vitals filed for this visit.  Subjective Assessment - 11/29/18 1151    Subjective  1.5 /10 pain     RT anterior knee    Pain Score  --   1.5   Pain Location  Knee    Pain Orientation  Right;Anterior    Pain Descriptors / Indicators  Aching;Discomfort    Pain Onset  More than a month ago    Pain Frequency  Intermittent    Aggravating Factors   walk /stand / getting out of chair    Pain Relieving Factors  rest                       [facility] Adult PT Treatment/Exercise - 11/29/18 0001      Knee/Hip Exercises: Stretches   Active Hamstring Stretch  Right;2 reps;30 seconds      Knee/Hip Exercises: Aerobic   Recumbent Bike  L2 5 min      Knee/Hip Exercises: Supine   Quad Sets  15 reps    Short Arc [employer]  20  reps    Short Arc Quad Sets Limitations  3#    Bridges with [employer]  10 reps    Straight Leg Raise with External Rotation  15 reps    Other Supine Knee/Hip Exercises  ball squeeze with PPT and hip lift x 20      Modalities   Modalities  Ultrasound      Ultrasound   Ultrasound Location  RT knee    Ultrasound Parameters  100% 3 MHz 1.1 Wcm2    Ultrasound Goals  Pain      Iontophoresis   Type of Iontophoresis  Dexamethasone    Location  Right medial/distal knee    Dose  1 ml    Time  6 hour patch                PT Short Term Goals - 11/26/18 1427      PT SHORT TERM GOAL #1   Title  she will be indpendent with initial HEP    Time  2    Period  Weeks    Status  On-going      PT SHORT TERM GOAL #2   Title  She will report pain decreased 30% or more with walking and able to walk at least 4 houses  before pain incr    Time  3    Period  Weeks    Status  On-going        PT Long Term Goals - 11/20/18 1247      PT LONG TERM GOAL #1   Title  She will be indpendent with all HEP issued.    Time  6    Period  Weeks    Status  New      PT LONG TERM GOAL #2   Title  RT knee pan will be decr by 75%  with wlaking 1/2 mile or more    Time  6    Period  Weeks    Status  New      PT LONG TERM GOAL #3   Title  She will report getting out of chair with no pain or limp on walking    Time  6    Period  Weeks    Status  New      PT LONG TERM GOAL #4   Title  FOTO score will decr from 53% to 35% limited to demo percieved improved function    Time  6    Period  Weeks    Status  New      PT LONG TERM GOAL #5   Title  She will report able to walk 12 steps without increased pain.    Time  6    Period  Weeks    Status  New            Plan - 11/29/18 1130    Clinical Impression Statement  Improveing but still with mild pain .  Treatment /ionto has been noticably helping.   She is still tender medial joint line and patella.  No incr pain post exercises.    PT  Treatment/Interventions  Taping;Patient/family education;Therapeutic activities;Therapeutic exercise;Ultrasound;Cryotherapy;Electrical Stimulation;Iontophoresis 4mg /ml Dexamethasone;Manual techniques    PT Next Visit Plan  Modalites , Hip and quad strength,  ionto,  taping.    PT Home Exercise Plan  QS,  SLR with ER, hp abduciton    Consulted and Agree with Plan of Care  Patient       Patient will benefit from skilled therapeutic intervention in order to improve the following deficits and impairments:  Pain, Difficulty walking, Decreased activity tolerance, Decreased strength  Visit Diagnosis: Muscle weakness (generalized)  Right knee pain, unspecified chronicity  Difficulty in walking, not elsewhere classified     Problem List Patient Active Problem List   Diagnosis Date Noted  . Metatarsal stress fracture, right, initial encounter 10/03/2016  . DJD (degenerative joint disease) 06/09/2016  . Labile hypertension 08/01/2014  . Vitamin D deficiency 08/01/2014  . Medication management 08/01/2014  . Obesity 07/04/2014  . Hyperlipemia 06/27/2013  . Attention deficit disorder (ADD)   . GERD (gastroesophageal reflux disease)   . Allergy   . Depression, major, in remission ([place])   . Anemia   . Ocular rosacea   . HSV infection   . Osteopenia     Kathy Cook  PT 11/29/2018, 12:15 PM  [place],  [place], [address] Phone: [phone]   Fax:  [phone]  Name: Kathy Cook MRN: [id number] Date of Birth: [birth year]

## 2018-12-04 ENCOUNTER — Other Ambulatory Visit: Payer: Self-pay

## 2018-12-04 ENCOUNTER — Ambulatory Visit: Payer: Medicare Other

## 2018-12-04 DIAGNOSIS — R262 Difficulty in walking, not elsewhere classified: Secondary | ICD-10-CM | POA: Diagnosis not present

## 2018-12-04 DIAGNOSIS — M25561 Pain in right knee: Secondary | ICD-10-CM | POA: Diagnosis not present

## 2018-12-04 DIAGNOSIS — M6281 Muscle weakness (generalized): Secondary | ICD-10-CM | POA: Diagnosis not present

## 2018-12-04 NOTE — Therapy (Signed)
[place], [place], [address] Phone: [phone]   Fax:  [phone]  Physical Therapy Treatment  Patient Details  Name: Kathy Cook MRN: [id number] Date of Birth: 08-30-1945 Referring Provider (PT): Rodell Perna, MD   Encounter Date: 12/04/2018  PT End of Session - 12/04/18 1225    Visit Number  5    Number of Visits  12    Date for PT Re-Evaluation  12/28/18    Authorization Type  UHC MCR    PT Start Time  1230    PT Stop Time  1300    PT Time Calculation (min)  30 min    Activity Tolerance  Patient tolerated treatment well    Behavior During Therapy  [facility] for tasks assessed/performed       Past Medical History:  Diagnosis Date  . Allergy   . Anemia   . Anxiety   . Attention deficit disorder (ADD)   . Cancer [facility])    ? basal/squam cell on chest  . Depression   . Dry eyes   . GERD (gastroesophageal reflux disease)   . HSV infection   . Lumbar degenerative disc disease   . Ocular rosacea   . Osteopenia     Past Surgical History:  Procedure Laterality Date  . SKIN BIOPSY     ?basal/squam cell on chest  . TONSILLECTOMY AND ADENOIDECTOMY      There were no vitals filed for this visit.  Subjective Assessment - 12/04/18 1228    Subjective  She reported she felt physically ill after last session.    Currently in Pain?  No/denies    Pain Score  --   mild discomfort   Pain Location  Knee    Pain Orientation  Right;Anterior;Medial    Pain Descriptors / Indicators  Discomfort    Pain Onset  More than a month ago    Pain Frequency  Intermittent    Aggravating Factors   activity on feet    Pain Relieving Factors  rest                       [facility] Adult PT Treatment/Exercise - 12/04/18 0001      Knee/Hip Exercises: Aerobic   Recumbent Bike  L3 5 min      Knee/Hip Exercises: Supine   Straight Leg Raise with External Rotation  20 reps    Straight Leg Raise with  External Rotation Limitations  3/3 1/2 at ank.e , 1/2 qt knee    Other Supine Knee/Hip Exercises  ball squeeze with PPT and hip lift x 20      Knee/Hip Exercises: Sidelying   Hip ABduction  Right;10 reps;2 sets    Hip ABduction Limitations  3#    Clams  RT 3# x20      Knee/Hip Exercises: Prone   Hamstring Curl  20 reps    Hamstring Curl Limitations  3#      Ultrasound   Ultrasound Location  --    Ultrasound Parameters  --    Ultrasound Goals  --      Iontophoresis   Type of Iontophoresis  Dexamethasone    Location  Right medial/distal knee    Dose  1 ml    Time  6 hour patch                PT Short Term Goals - 12/04/18 1318      PT SHORT  TERM GOAL #1   Title  she will be indpendent with initial HEP    Status  Achieved      PT SHORT TERM GOAL #2   Title  She will report pain decreased 30% or more with walking and able to walk at least 4 houses  before pain incr    Baseline  imprved but varies in intensity . Still tender medial patella and joint line      PT SHORT TERM GOAL #3   Status  On-going        PT Long Term Goals - 11/20/18 1247      PT LONG TERM GOAL #1   Title  She will be indpendent with all HEP issued.    Time  6    Period  Weeks    Status  New      PT LONG TERM GOAL #2   Title  RT knee pan will be decr by 75%  with wlaking 1/2 mile or more    Time  6    Period  Weeks    Status  New      PT LONG TERM GOAL #3   Title  She will report getting out of chair with no pain or limp on walking    Time  6    Period  Weeks    Status  New      PT LONG TERM GOAL #4   Title  FOTO score will decr from 53% to 35% limited to demo percieved improved function    Time  6    Period  Weeks    Status  New      PT LONG TERM GOAL #5   Title  She will report able to walk 12 steps without increased pain.    Time  6    Period  Weeks    Status  New            Plan - 12/04/18 1310    Clinical Impression Statement  She reported illness after last  session needing to go to bed . She related to Ionto patch and in past with oral steroids she has smoe isssued but sh wanted to try the patch again as her knee did feel better  after the first time she had ionto.  If she is ill we sill not dothe patch again.    PT Next Visit Plan  Modalites , Hip and quad strength,  ionto if no bad reaction,  taping if not able to do the ionto again.  start band for HEP.    PT Home Exercise Plan  QS,  SLR with ER, hp abduciton    Consulted and Agree with Plan of Care  Patient       Patient will benefit from skilled therapeutic intervention in order to improve the following deficits and impairments:     Visit Diagnosis: Difficulty in walking, not elsewhere classified  Right knee pain, unspecified chronicity     Problem List Patient Active Problem List   Diagnosis Date Noted  . Metatarsal stress fracture, right, initial encounter 10/03/2016  . DJD (degenerative joint disease) 06/09/2016  . Labile hypertension 08/01/2014  . Vitamin D deficiency 08/01/2014  . Medication management 08/01/2014  . Obesity 07/04/2014  . Hyperlipemia 06/27/2013  . Attention deficit disorder (ADD)   . GERD (gastroesophageal reflux disease)   . Allergy   . Depression, major, in remission ([place])   . Anemia   . Ocular rosacea   .  HSV infection   . Osteopenia     [name]  PT 12/04/2018, 1:21 PM  [facility] [address] [place], [place], [address] Phone: [phone]   Fax:  [phone]  Name: Kathy Cook MRN: [id number] Date of Birth: November 02, 1945

## 2018-12-06 ENCOUNTER — Other Ambulatory Visit: Payer: Self-pay

## 2018-12-06 ENCOUNTER — Ambulatory Visit: Payer: Medicare Other

## 2018-12-06 DIAGNOSIS — M6281 Muscle weakness (generalized): Secondary | ICD-10-CM

## 2018-12-06 DIAGNOSIS — M25561 Pain in right knee: Secondary | ICD-10-CM

## 2018-12-06 DIAGNOSIS — R262 Difficulty in walking, not elsewhere classified: Secondary | ICD-10-CM | POA: Diagnosis not present

## 2018-12-06 NOTE — Therapy (Signed)
[place], [place], [address] Phone: [phone]   Fax:  [phone]  Physical Therapy Treatment  Patient Details  Name: Kathy Cook MRN: [id number] Date of Birth: [birth year] Referring Provider (PT): Rodell Perna, MD   Encounter Date: 12/06/2018  PT End of Session - 12/06/18 1000    Visit Number  6    Number of Visits  12    Date for PT Re-Evaluation  12/28/18    Authorization Type  UHC MCR    PT Start Time  1000    PT Stop Time  1040    PT Time Calculation (min)  40 min    Activity Tolerance  Patient tolerated treatment well    Behavior During Therapy  [facility] for tasks assessed/performed       Past Medical History:  Diagnosis Date  . Allergy   . Anemia   . Anxiety   . Attention deficit disorder (ADD)   . Cancer [facility]    ? basal/squam cell on chest  . Depression   . Dry eyes   . GERD (gastroesophageal reflux disease)   . HSV infection   . Lumbar degenerative disc disease   . Ocular rosacea   . Osteopenia     Past Surgical History:  Procedure Laterality Date  . SKIN BIOPSY     ?basal/squam cell on chest  . TONSILLECTOMY AND ADENOIDECTOMY      There were no vitals filed for this visit.  Subjective Assessment - 12/06/18 1003    Subjective  Did not feel well again but not as bad as before. Having cramping in RT leg posterior at night.    Pain Score  2     Pain Location  Knee    Pain Orientation  Right;Anterior;Medial    Pain Descriptors / Indicators  Sore    Pain Onset  More than a month ago    Pain Frequency  Intermittent                       [facility] Adult PT Treatment/Exercise - 12/06/18 0001      Knee/Hip Exercises: Sidelying   Hip ABduction  Right;15 reps    Hip ABduction Limitations  blue band    Clams  RT blue band x 20      Ultrasound   Ultrasound Location  RT kne    Ultrasound Parameters  100% 3 MHZ 1/1 WCm2    Ultrasound Goals  Pain      Manual  Therapy   Manual therapy comments  kineseotape 2 Y's around patella               PT Short Term Goals - 12/06/18 1043      PT SHORT TERM GOAL #1   Title  she will be indpendent with initial HEP    Status  Achieved      PT SHORT TERM GOAL #2   Title  She will report pain decreased 30% or more with walking and able to walk at least 4 houses  before pain incr    Baseline  imprved but varies in intensity . Still tender medial patella and joint line    Status  On-going        PT Long Term Goals - 12/06/18 1043      PT LONG TERM GOAL #1   Title  She will be indpendent with all HEP issued.    Status  On-going  PT LONG TERM GOAL #2   Title  RT knee pan will be decr by 75%  with wlaking 1/2 mile or more    Status  On-going      PT LONG TERM GOAL #4   Title  FOTO score will decr from 53% to 35% limited to demo percieved improved function    Status  Unable to assess      PT LONG TERM GOAL #5   Title  She will report able to walk 12 steps without increased pain.    Status  On-going            Plan - 12/06/18 1000    Clinical Impression Statement  More sore today but no limp. She had stopped icing and I suggested she continue 2x/day with this for 4-5 days an see if that is helpful. Stopped ionto due to pt did nopt feel well post application.    Will tape and see x 2 and if no better return to MD    PT Treatment/Interventions  Taping;Patient/family education;Therapeutic activities;Therapeutic exercise;Ultrasound;Cryotherapy;Electrical Stimulation;Iontophoresis 4mg /ml Dexamethasone;Manual techniques    PT Next Visit Plan  Modalites , Hip and quad strength,  ionto if no bad reaction,  taping if not able to do the ionto again.  start band for HEP.    PT Home Exercise Plan  QS,  SLR with ER, hip abduciton    Consulted and Agree with Plan of Care  Patient       Patient will benefit from skilled therapeutic intervention in order to improve the following deficits and  impairments:  Pain, Difficulty walking, Decreased activity tolerance, Decreased strength  Visit Diagnosis: Right knee pain, unspecified chronicity  Muscle weakness (generalized)     Problem List Patient Active Problem List   Diagnosis Date Noted  . Metatarsal stress fracture, right, initial encounter 10/03/2016  . DJD (degenerative joint disease) 06/09/2016  . Labile hypertension 08/01/2014  . Vitamin D deficiency 08/01/2014  . Medication management 08/01/2014  . Obesity 07/04/2014  . Hyperlipemia 06/27/2013  . Attention deficit disorder (ADD)   . GERD (gastroesophageal reflux disease)   . Allergy   . Depression, major, in remission ([place])   . Anemia   . Ocular rosacea   . HSV infection   . Osteopenia     [name]  PT 12/06/2018, 10:44 AM  [facility] [address] [place], [place], [address] Phone: [phone]   Fax:  [phone]  Name: Kathy Cook MRN: [id number] Date of Birth: 09/04/1945

## 2018-12-11 ENCOUNTER — Ambulatory Visit: Payer: Medicare Other

## 2018-12-11 ENCOUNTER — Other Ambulatory Visit: Payer: Self-pay

## 2018-12-11 DIAGNOSIS — M25561 Pain in right knee: Secondary | ICD-10-CM

## 2018-12-11 DIAGNOSIS — R262 Difficulty in walking, not elsewhere classified: Secondary | ICD-10-CM

## 2018-12-11 DIAGNOSIS — M6281 Muscle weakness (generalized): Secondary | ICD-10-CM | POA: Diagnosis not present

## 2018-12-11 NOTE — Therapy (Signed)
[place], [place], [address] Phone: [phone]   Fax:  [phone]  Physical Therapy Treatment  Patient Details  Name: Kathy Cook MRN: [id number] Date of Birth: June 21, 1945 Referring Provider (PT): Rodell Perna, MD   Encounter Date: 12/11/2018  PT End of Session - 12/11/18 1218    Visit Number  7    Number of Visits  12    Date for PT Re-Evaluation  12/28/18    Authorization Type  [employer] MCR    PT Start Time  [id number]    PT Stop Time  [id number]    PT Time Calculation (min)  40 min    Activity Tolerance  Patient tolerated treatment well;No increased pain    Behavior During Therapy  WFL for tasks assessed/performed       Past Medical History:  Diagnosis Date  . Allergy   . Anemia   . Anxiety   . Attention deficit disorder (ADD)   . Cancer [facility])    ? basal/squam cell on chest  . Depression   . Dry eyes   . GERD (gastroesophageal reflux disease)   . HSV infection   . Lumbar degenerative disc disease   . Ocular rosacea   . Osteopenia     Past Surgical History:  Procedure Laterality Date  . SKIN BIOPSY     ?basal/squam cell on chest  . TONSILLECTOMY AND ADENOIDECTOMY      There were no vitals filed for this visit.  Subjective Assessment - 12/11/18 1221    Subjective  IT's been sore. Tape did not stay on more than a day.    Pain Score  2     Pain Location  Knee    Pain Orientation  Right;Anterior;Medial    Pain Descriptors / Indicators  Sore    Pain Onset  More than a month ago    Pain Frequency  Intermittent    Aggravating Factors   activity on feet    Pain Relieving Factors  rest                       [facility] Adult PT Treatment/Exercise - 12/11/18 0001      Knee/Hip Exercises: Aerobic   Recumbent Bike  L3 5 min      Knee/Hip Exercises: Supine   Short Arc Quad Sets  Right    Short Arc Quad Sets Limitations  10 with 3# then 20 with 5#    Straight Leg Raises  Right;20  reps    Straight Leg Raises Limitations  2#      Knee/Hip Exercises: Sidelying   Hip ABduction  Right;15 reps    Hip ABduction Limitations  3#    Clams  RT 3# x 15      Knee/Hip Exercises: Prone   Hamstring Curl  20 reps      [place] 100% 3.3MHz 1.1 Wc2  Anterior  Medial RT knee         PT Short Term Goals - 12/06/18 1043      PT SHORT TERM GOAL #1   Title  she will be indpendent with initial HEP    Status  Achieved      PT SHORT TERM GOAL #2   Title  She will report pain decreased 30% or more with walking and able to walk at least 4 houses  before pain incr    Baseline  imprved but varies in intensity . Still tender medial patella  and joint line    Status  On-going        PT Long Term Goals - 12/06/18 1043      PT LONG TERM GOAL #1   Title  She will be indpendent with all HEP issued.    Status  On-going      PT LONG TERM GOAL #2   Title  RT knee pan will be decr by 75%  with wlaking 1/2 mile or more    Status  On-going      PT LONG TERM GOAL #4   Title  FOTO score will decr from 53% to 35% limited to demo percieved improved function    Status  Unable to assess      PT LONG TERM GOAL #5   Title  She will report able to walk 12 steps without increased pain.    Status  On-going            Plan - 12/11/18 1305    Clinical Impression Statement  About the same but she feels it is helping some. so we agreed to add 4 more sessions  then discharge. declined taping today.    PT Treatment/Interventions  Taping;Patient/family education;Therapeutic activities;Therapeutic exercise;Ultrasound;Cryotherapy;Electrical Stimulation;Iontophoresis 4mg /ml Dexamethasone;Manual techniques    PT Next Visit Plan  Goals, Modalites , Hip and quad strength,  .  start band for HEP. suggested weight purchase by end of EOC  add SAQ,  hip adduction extension  and prone or standing knee flexion    PT Home Exercise Plan  QS,  SLR with ER, hip abduciton    Consulted and Agree with Plan of  Care  Patient       Patient will benefit from skilled therapeutic intervention in order to improve the following deficits and impairments:  Pain, Difficulty walking, Decreased activity tolerance, Decreased strength  Visit Diagnosis: Muscle weakness (generalized)  Right knee pain, unspecified chronicity  Difficulty in walking, not elsewhere classified     Problem List Patient Active Problem List   Diagnosis Date Noted  . Metatarsal stress fracture, right, initial encounter 10/03/2016  . DJD (degenerative joint disease) 06/09/2016  . Labile hypertension 08/01/2014  . Vitamin D deficiency 08/01/2014  . Medication management 08/01/2014  . Obesity 07/04/2014  . Hyperlipemia 06/27/2013  . Attention deficit disorder (ADD)   . GERD (gastroesophageal reflux disease)   . Allergy   . Depression, major, in remission ([place])   . Anemia   . Ocular rosacea   . HSV infection   . Osteopenia     [name]  PT 12/11/2018, 1:10 PM  [facility] [address] [place], [place], [address] Phone: [phone]   Fax:  [phone]  Name: Kathy Cook MRN: [id number] Date of Birth: Jul 17, 1945

## 2018-12-13 ENCOUNTER — Ambulatory Visit: Payer: Medicare Other

## 2018-12-18 ENCOUNTER — Ambulatory Visit: Payer: Medicare Other

## 2018-12-20 ENCOUNTER — Ambulatory Visit: Payer: Medicare Other

## 2018-12-20 ENCOUNTER — Other Ambulatory Visit: Payer: Self-pay

## 2018-12-20 DIAGNOSIS — R262 Difficulty in walking, not elsewhere classified: Secondary | ICD-10-CM

## 2018-12-20 DIAGNOSIS — M25561 Pain in right knee: Secondary | ICD-10-CM

## 2018-12-20 DIAGNOSIS — M6281 Muscle weakness (generalized): Secondary | ICD-10-CM | POA: Diagnosis not present

## 2018-12-20 NOTE — Therapy (Addendum)
[place], [place], [address] Phone: [phone]   Fax:  [phone]  Physical Therapy Treatment/Discharge  Patient Details  Name: Kathy Cook MRN: [id number] Date of Birth: Feb 16, 1946 Referring Provider (PT): Rodell Perna, MD   Encounter Date: 12/20/2018  PT End of Session - 12/20/18 1133    Visit Number  8    Number of Visits  12    Date for PT Re-Evaluation  12/28/18    Authorization Type  [employer] MCR    PT Start Time  1130    PT Stop Time  1215    PT Time Calculation (min)  45 min       Past Medical History:  Diagnosis Date  . Allergy   . Anemia   . Anxiety   . Attention deficit disorder (ADD)   . Cancer [facility])    ? basal/squam cell on chest  . Depression   . Dry eyes   . GERD (gastroesophageal reflux disease)   . HSV infection   . Lumbar degenerative disc disease   . Ocular rosacea   . Osteopenia     Past Surgical History:  Procedure Laterality Date  . SKIN BIOPSY     ?basal/squam cell on chest  . TONSILLECTOMY AND ADENOIDECTOMY      There were no vitals filed for this visit.  Subjective Assessment - 12/20/18 1136    Subjective  Intermittant pain . Did house work this AM and now have some pain.    Pain Score  3     Pain Location  Knee    Pain Orientation  Right;Anterior;Medial    Pain Descriptors / Indicators  Sore    Pain Type  Chronic pain    Pain Onset  More than a month ago    Pain Frequency  Intermittent    Aggravating Factors   activity on feet                       [facility] Adult PT Treatment/Exercise - 12/20/18 0001      Knee/Hip Exercises: Aerobic   Recumbent Bike  L3 5 min        Reviewed all HEP and she is able to do QS/SLR and hip abduction strength with 2-3# weight.    [place] 100% 3 MHZ 8 min  1.1 Wcm2     PT Short Term Goals - 12/20/18 1214      PT SHORT TERM GOAL #1   Title  she will be indpendent with initial HEP    Status  Achieved       PT SHORT TERM GOAL #2   Title  She will report pain decreased 30% or more with walking and able to walk at least 4 houses  before pain incr    Status  Partially Met        PT Long Term Goals - 12/20/18 1215      PT LONG TERM GOAL #1   Title  She will be indpendent with all HEP issued.    Status  On-going      PT LONG TERM GOAL #2   Title  RT knee pan will be decr by 75%  with wlaking 1/2 mile or more    Status  On-going            Plan - 12/20/18 1211    Clinical Impression Statement  Though some better the pain has not resolved and continued with activity  oin feet. //////No limp.  REst eases. She did not tolerate tape or iontophoresis. [place] was fine and no significant increase with exercise . She is doing HEP with light weight.  She feels and I agree  Dr Lorin Mercy should take another look to be sure she doesn't have other issues preventing progress.    PT Treatment/Interventions  Taping;Patient/family education;Therapeutic activities;Therapeutic exercise;Ultrasound;Cryotherapy;Electrical Stimulation;Iontophoresis 50m/ml Dexamethasone;Manual techniques    PT Next Visit Plan  She agreed to be put on hold for 4 weeks and return to MD.    PT Home Exercise Plan  QS,  SLR with ER, hip abduciton       Patient will benefit from skilled therapeutic intervention in order to improve the following deficits and impairments:  Pain, Difficulty walking, Decreased activity tolerance, Decreased strength  Visit Diagnosis: Right knee pain, unspecified chronicity  Muscle weakness (generalized)  Difficulty in walking, not elsewhere classified     Problem List Patient Active Problem List   Diagnosis Date Noted  . Metatarsal stress fracture, right, initial encounter 10/03/2016  . DJD (degenerative joint disease) 06/09/2016  . Labile hypertension 08/01/2014  . Vitamin D deficiency 08/01/2014  . Medication management 08/01/2014  . Obesity 07/04/2014  . Hyperlipemia 06/27/2013  .  Attention deficit disorder (ADD)   . GERD (gastroesophageal reflux disease)   . Allergy   . [place]   . Anemia   . Ocular rosacea   . HSV infection   . Osteopenia     CDarrel Hoover PT 12/20/2018, 12:15 PM  [facility] [address]Phone: [phone]  Fax:  [phone] Name: Kathy HINCHMRN: [id number] of Birth: [birth year] PHYSICAL THERAPY DISCHARGE SUMMARY  Visits from Start of Care: 8 Current functional level related to goals / functional outcomes: See above    Remaining deficits: See above   Education / Equipment: HEP Plan: Patient agrees to discharge.  Patient goals were not met. Patient is being discharged due to lack of progress.  ?????    SPearson ForsterPT   01/31/19

## 2018-12-25 ENCOUNTER — Ambulatory Visit: Payer: Medicare Other

## 2018-12-27 ENCOUNTER — Ambulatory Visit: Payer: Medicare Other

## 2019-01-01 ENCOUNTER — Ambulatory Visit: Payer: Medicare Other

## 2019-01-03 ENCOUNTER — Ambulatory Visit: Payer: Medicare Other

## 2019-01-09 ENCOUNTER — Encounter: Payer: Self-pay | Admitting: Orthopaedic Surgery

## 2019-01-09 ENCOUNTER — Ambulatory Visit: Payer: Medicare Other | Admitting: Orthopaedic Surgery

## 2019-01-09 ENCOUNTER — Other Ambulatory Visit: Payer: Self-pay

## 2019-01-09 DIAGNOSIS — M25561 Pain in right knee: Secondary | ICD-10-CM

## 2019-01-09 DIAGNOSIS — M2391 Unspecified internal derangement of right knee: Secondary | ICD-10-CM | POA: Diagnosis not present

## 2019-01-09 NOTE — Progress Notes (Signed)
Office Visit Note   Patient: Kathy Cook           Date of Birth: October 17, 1945           MRN: [id number] Visit Date: 01/09/2019              Requested by: Unk Pinto, [place] [address] [place],  [place] [id number] PCP: Unk Pinto, MD   Assessment & Plan: Visit Diagnoses: No diagnosis found.  Plan: Persistent right knee pain catching.  Patient needs to proceed with an MRI scan she has failed anti-inflammatories intermittent ice activity modification, intra-articular cortisone injections and physical therapy.  Office follow-up after MRI to rule out medial meniscal tear with her intermittent catching and locking symptoms.  Follow-Up Instructions: Follow-up after MRI scan right knee.  Orders:  No orders of the defined types were placed in this encounter.  No orders of the defined types were placed in this encounter.     Procedures: No procedures performed   Clinical Data: No additional findings.   Subjective: Chief Complaint  Patient presents with  . Right Knee - Pain, Follow-up    HPI 73 year old female returns with ongoing problems with her right knee.  Previous intra-articular injection 11/09/2018 only gave her some slight improvement for a few days.  She is going to therapy therapy so that it was not worthwhile to continue therapy since she is not having improvement with continued medial joint line pain and catching she has been amatory with a limp and has had right knee swelling.  Onset of symptoms began Labor Day when she went hiking with family and grandchildren and twisted her knee and had persistent pain.  Patient's been on anti-inflammatories ice elevation activity modification and intra-articular injection without relief.  Review of Systems 14 point review of systems updated unchanged from 11/09/2018 office visit.  No history of gout.  Previous x-ray showed mild knee osteoarthritis minimal spurring and minimal joint space narrowing.   Negative for acute changes.   Objective: Vital Signs: Ht 5\' 2"  (1.575 m)   Wt 192 lb (87.1 kg)   BMI 35.12 kg/m   Physical Exam Constitutional:      Appearance: [name].  HENT:     Head: Normocephalic.     Right Ear: External ear normal.     Left Ear: External ear normal.  Eyes:     Pupils: Pupils are equal, round, and reactive to light.  Neck:     Thyroid: No thyromegaly.     Trachea: No tracheal deviation.  Cardiovascular:     Rate and Rhythm: Normal rate.  Pulmonary:     Effort: Pulmonary effort is normal.  Abdominal:     Palpations: Abdomen is soft.  Skin:    General: Skin is warm and dry.  Neurological:     Mental Status: [name], place, and time.  Psychiatric:        Behavior: Behavior normal.     Ortho Exam patient is amatory with a right knee limp.  She has exquisite medial joint line tenderness positive McMurray's test negative Lachman negative anterior drawer negative posterior drawer.  Collateral ligaments are stable.  Mild discomfort with patellofemoral loading and quadriceps contracture.  Specialty Comments:  No specialty comments available.  Imaging: No results found.   PMFS History: Patient Active Problem List   Diagnosis Date Noted  . Metatarsal stress fracture, right, initial encounter 10/03/2016  . DJD (degenerative joint disease) 06/09/2016  .  Labile hypertension 08/01/2014  . Vitamin D deficiency 08/01/2014  . Medication management 08/01/2014  . Obesity 07/04/2014  . Hyperlipemia 06/27/2013  . Attention deficit disorder (ADD)   . GERD (gastroesophageal reflux disease)   . Allergy   . Depression, major, in remission ([place])   . Anemia   . Ocular rosacea   . HSV infection   . Osteopenia    Past Medical History:  Diagnosis Date  . Allergy   . Anemia   . Anxiety   . Attention deficit disorder (ADD)   . Cancer [facility])    ? basal/squam cell on chest  . Depression   . Dry eyes   . GERD  (gastroesophageal reflux disease)   . HSV infection   . Lumbar degenerative disc disease   . Ocular rosacea   . Osteopenia     Family History  Problem Relation Age of Onset  . Stroke Father   . Diabetes Father   . Hypertension Mother   . Depression Mother   . Macular degeneration Mother     Past Surgical History:  Procedure Laterality Date  . SKIN BIOPSY     ?basal/squam cell on chest  . TONSILLECTOMY AND ADENOIDECTOMY     Social History   Occupational History  . Not on file  Tobacco Use  . Smoking status: Former Smoker    Packs/day: 0.50    Years: 15.00    Pack years: 7.50    Types: Cigarettes    Quit date: 03/23/1980    Years since quitting: 38.8  . Smokeless tobacco: Never Used  Substance and Sexual Activity  . Alcohol use: Yes    Alcohol/week: 0.0 standard drinks    Comment: social  . Drug use: No  . Sexual activity: Yes

## 2019-01-09 NOTE — Addendum Note (Signed)
Addended by: Meyer Cory on: 01/09/2019 09:31 AM   Modules accepted: Orders

## 2019-01-10 ENCOUNTER — Other Ambulatory Visit: Payer: Self-pay

## 2019-01-11 MED ORDER — AMPHETAMINE-DEXTROAMPHETAMINE 30 MG PO TABS: ORAL_TABLET | ORAL | 0 refills | Status: DC

## 2019-02-02 ENCOUNTER — Other Ambulatory Visit: Payer: Self-pay

## 2019-02-02 ENCOUNTER — Ambulatory Visit
Admission: RE | Admit: 2019-02-02 | Discharge: 2019-02-02 | Disposition: A | Discharge status: Home / Self Care | Payer: Medicare Other | Source: Ambulatory Visit | Attending: Orthopaedic Surgery | Admitting: Orthopaedic Surgery

## 2019-02-02 DIAGNOSIS — M2391 Unspecified internal derangement of right knee: Secondary | ICD-10-CM

## 2019-02-02 DIAGNOSIS — M25561 Pain in right knee: Secondary | ICD-10-CM

## 2019-02-05 ENCOUNTER — Other Ambulatory Visit: Payer: Self-pay

## 2019-02-05 ENCOUNTER — Encounter: Payer: Self-pay | Admitting: Orthopaedic Surgery

## 2019-02-05 ENCOUNTER — Ambulatory Visit: Payer: Medicare Other | Admitting: Orthopaedic Surgery

## 2019-02-05 DIAGNOSIS — M1711 Unilateral primary osteoarthritis, right knee: Secondary | ICD-10-CM

## 2019-02-05 NOTE — Progress Notes (Signed)
Office Visit Note   Patient: Kathy Cook           Date of Birth: December 12, 1945           MRN: [id number] Visit Date: 02/05/2019              Requested by: Unk Pinto, [place] [employer] [place],  [place] [id number] PCP: Unk Pinto, MD   Assessment & Plan: Visit Diagnoses:  1. Unilateral primary osteoarthritis, right knee     Plan: We reviewed the MRI scan and previous knee x-rays.  She has significant narrowing of patellofemoral joint with marginal osteophytes.  No subchondral cyst formation.  MRI scan shows areas of full-thickness cartilage loss particularly at the patellofemoral joint and less severe in the medial lateral compartment.  She has medial lateral meniscal tearing noted on MRI scan and I discussed with her I do not think it is likely that arthroscopic partial meniscectomies would help her much since she has bone-on-bone changes in some areas and removing the meniscus likely would make her pain problem worse and decrease her ambulation rather than improving it.  Surgery option would be total knee arthroplasty which was discussed and reviewed.  Questions were elicited and answered she has a neighbor demonstrated just had the procedure and she is familiar with it.  She like to set this up after New Year's.  We discussed home physical therapy spinal anesthesia the importance of postoperative outpatient rehab after home therapy.  Follow-Up Instructions: No follow-ups on file.   Orders:  No orders of the defined types were placed in this encounter.  No orders of the defined types were placed in this encounter.     Procedures: No procedures performed   Clinical Data: No additional findings.   Subjective: Chief Complaint  Patient presents with  . Right Knee - Pain, Follow-up    MRI Review    HPI 73 year old female returns for chronic right knee osteoarthritis.  She has had intra-articular injections with cortisone without sustained relief.   She taken anti-inflammatories.  Her pain is been severe for more than 2 months preventing her from ambulating in the community since she went hiking with her family and grandchildren.  She has had intermittent pain problems problems getting from sitting to standing is amatory with a limp.  MRI scan has been obtained and is available for review.  She is done home exercise quad program used ice intermittently topical rubs without relief.  I have followed her since September with progressive increase symptoms repetitive catching and locking particularly getting from sitting to standing or with patellofemoral joint loading.  Review of Systems 14 point review of system positive for GERD, depression, hypertension vitamin D deficiency.  Negative the history for gout pseudogout.  No past history of knee injury she denies hip pain.  Otherwise negative is obtains HPI.   Objective: Vital Signs: BP (!) 147/84   Pulse 80   Ht 5\' 2"  (1.575 m)   Wt 180 lb (81.6 kg)   BMI 32.92 kg/m   Physical Exam Constitutional:      Appearance: [name].  HENT:     Head: Normocephalic.     Right Ear: External ear normal.     Left Ear: External ear normal.  Eyes:     Pupils: Pupils are equal, round, and reactive to light.  Neck:     Thyroid: No thyromegaly.     Trachea: No tracheal deviation.  Cardiovascular:  Rate and Rhythm: Normal rate.     Comments: Normal S1 normal S2.  No murmur or gallop. Pulmonary:     Effort: Pulmonary effort is normal.     Comments: Lung sounds posteriorly anteriorly are normal without wheezing rales or rhonchi. Abdominal:     Palpations: Abdomen is soft.  Skin:    General: Skin is warm and dry.  Neurological:     Mental Status: [name], place, and time.  Psychiatric:        Behavior: Behavior normal.     Ortho Exam patient has crepitus with both right and left knee flexion extension she can flex down to 20 degrees she does come out to  full extension.  Good quad strength negative logroll the hips.  Homans test is negative no lower extremity atrophy.  Normal distal pulses.  Specialty Comments:  No specialty comments available.  Imaging: CLINICAL DATA:  Right knee pain since a hiking injury 11/01/2018  EXAM: MRI OF THE RIGHT KNEE WITHOUT CONTRAST  TECHNIQUE: Multiplanar, multisequence MR imaging of the knee was performed. No intravenous contrast was administered.  COMPARISON:  None.  FINDINGS: MENISCI  Medial meniscus: Inferior articular surface tear involving the posterior horn and extending into the mid body junction region.  Lateral meniscus: Intrasubstance degenerative type changes involving the anterior horn and mild discoid morphology. There is also a small peripheral inferior articular surface tear involving the posterior horn.  LIGAMENTS  Cruciates:  Intact  Collaterals:  Intact  CARTILAGE  Patellofemoral: Advanced degenerative chondrosis with areas of full and near full-thickness cartilage loss, joint space narrowing, spurring and subchondral cystic change.  Medial:  Moderate degenerative chondrosis.  Lateral:  Moderate degenerative chondrosis.  Joint:  Small joint effusion.  Popliteal Fossa: No popliteal mass or Baker's cyst. There is some fluid tracking back along the popliteus tendon.  Extensor Mechanism: The patella retinacular structures are intact and the quadriceps and patellar tendons are intact.  Bones:  No acute bony findings.  Other: Normal knee musculature.  IMPRESSION: 1. Medial and lateral meniscal tears as described above. 2. Intact ligamentous structures and no acute bony findings. 3. Tricompartmental degenerative changes most significant in the patellofemoral joint. 4. Small joint effusion.   Electronically Signed   By: Marijo Sanes M.D.   On: 02/03/2019 11:54   PMFS History: Patient Active Problem List   Diagnosis Date Noted  .  Unilateral primary osteoarthritis, right knee 02/05/2019  . Knee locking, right 01/09/2019  . Metatarsal stress fracture, right, initial encounter 10/03/2016  . DJD (degenerative joint disease) 06/09/2016  . Labile hypertension 08/01/2014  . Vitamin D deficiency 08/01/2014  . Medication management 08/01/2014  . Obesity 07/04/2014  . Hyperlipemia 06/27/2013  . Attention deficit disorder (ADD)   . GERD (gastroesophageal reflux disease)   . Allergy   . Depression, major, in remission ([place])   . Anemia   . Ocular rosacea   . HSV infection   . Osteopenia    Past Medical History:  Diagnosis Date  . Allergy   . Anemia   . Anxiety   . Attention deficit disorder (ADD)   . Cancer [facility])    ? basal/squam cell on chest  . Depression   . Dry eyes   . GERD (gastroesophageal reflux disease)   . HSV infection   . Lumbar degenerative disc disease   . Ocular rosacea   . Osteopenia     Family History  Problem Relation Age of Onset  .  Stroke Father   . Diabetes Father   . Hypertension Mother   . Depression Mother   . Macular degeneration Mother     Past Surgical History:  Procedure Laterality Date  . SKIN BIOPSY     ?basal/squam cell on chest  . TONSILLECTOMY AND ADENOIDECTOMY     Social History   Occupational History  . Not on file  Tobacco Use  . Smoking status: Former Smoker    Packs/day: 0.50    Years: 15.00    Pack years: 7.50    Types: Cigarettes    Quit date: 03/23/1980    Years since quitting: 38.8  . Smokeless tobacco: Never Used  Substance and Sexual Activity  . Alcohol use: Yes    Alcohol/week: 0.0 standard drinks    Comment: social  . Drug use: No  . Sexual activity: Yes

## 2019-02-19 NOTE — Pre-Procedure Instructions (Signed)
BONNELL LERUM  02/19/2019      [facility] [phone] - [place], [place] - [address] [place] [address] Phone: [phone] Fax: [phone]    Your procedure is scheduled on March 04, 2019.  Report to [facility] Entrance "A" at 1030 AM.  Call this number if you have problems the morning of surgery:  [phone]   Call [phone] if you have any questions prior to your surgery date Monday-Friday 8am-4pm    Remember:  Do not eat or drink after midnight.    Take these medicines the morning of surgery with A SIP OF WATER  -none  Do Not take Adderall the day of surgery  7 days prior to surgery STOP taking any Aspirin (unless otherwise instructed by your surgeon), Aleve, Naproxen, Ibuprofen, Motrin, Advil, Goody's, BC's, all herbal medications, fish oil, and all vitamins   Day of surgery:  Do not wear jewelry, make-up or nail polish.  Do not wear lotions, powders, or perfumes, or deodorant.  Do not shave 48 hours prior to surgery.    Do not bring valuables to the hospital.  [facility] is not responsible for any belongings or valuables.  Contacts, dentures or bridgework may not be worn into surgery.  Leave your suitcase in the car.  After surgery it may be brought to your room.  For patients admitted to the hospital, discharge time will be determined by your treatment team.  Patients discharged the day of surgery will not be allowed to drive home.    [facility]- Preparing For Surgery  Before surgery, you can play an important role. Because skin is not sterile, your skin needs to be as free of germs as possible. You can reduce the number of germs on your skin by washing with CHG (chlorahexidine gluconate) Soap before surgery.  CHG is an antiseptic cleaner which kills germs and bonds with the skin to continue killing germs even after washing.    Oral Hygiene is also important to reduce  your risk of infection.  Remember - BRUSH YOUR TEETH THE MORNING OF SURGERY WITH YOUR REGULAR TOOTHPASTE  Please do not use if you have an allergy to CHG or antibacterial soaps. If your skin becomes reddened/irritated stop using the CHG.  Do not shave (including legs and underarms) for at least 48 hours prior to first CHG shower. It is OK to shave your face.  Please follow these instructions carefully.   1. Shower the NIGHT BEFORE SURGERY and the MORNING OF SURGERY with CHG.   2. If you chose to wash your hair, wash your hair first as usual with your normal shampoo.  3. After you shampoo, rinse your hair and body thoroughly to remove the shampoo.  4. Use CHG as you would any other liquid soap. You can apply CHG directly to the skin and wash gently with a scrungie or a clean washcloth.   5. Apply the CHG Soap to your body ONLY FROM THE NECK DOWN.  Do not use on open wounds or open sores. Avoid contact with your eyes, ears, mouth and genitals (private parts). Wash Face and genitals (private parts)  with your normal soap.  6. Wash thoroughly, paying special attention to the area where your surgery will be performed.  7. Thoroughly rinse your body with warm water from the neck down.  8. DO NOT shower/wash with your normal soap after using and rinsing off the CHG Soap.  9. Pat yourself dry with a CLEAN TOWEL.  10. Wear CLEAN PAJAMAS to bed the night before surgery, wear comfortable clothes the morning of surgery  11. Place CLEAN SHEETS on your bed the night of your first shower and DO NOT SLEEP WITH PETS.  Day of Surgery: Shower with CHG as instructed above Do not apply any deodorants/lotions.  Please wear clean clothes to the hospital/surgery center.   Remember to brush your teeth WITH YOUR REGULAR TOOTHPASTE.  Please read over the following fact sheets that you were given.

## 2019-02-20 ENCOUNTER — Encounter (HOSPITAL_COMMUNITY)
Admission: RE | Admit: 2019-02-20 | Discharge: 2019-02-20 | Disposition: A | Discharge status: Home / Self Care | Payer: Medicare Other | Source: Ambulatory Visit | Attending: Orthopaedic Surgery | Admitting: Orthopaedic Surgery

## 2019-02-20 ENCOUNTER — Encounter (HOSPITAL_COMMUNITY): Payer: Self-pay

## 2019-02-20 ENCOUNTER — Ambulatory Visit (HOSPITAL_COMMUNITY)
Admission: RE | Admit: 2019-02-20 | Discharge: 2019-02-20 | Disposition: A | Discharge status: Home / Self Care | Payer: Medicare Other | Source: Ambulatory Visit | Attending: Surgery | Admitting: Surgery

## 2019-02-20 ENCOUNTER — Other Ambulatory Visit: Payer: Self-pay

## 2019-02-20 DIAGNOSIS — Z01818 Encounter for other preprocedural examination: Secondary | ICD-10-CM | POA: Insufficient documentation

## 2019-02-20 LAB — COMPREHENSIVE METABOLIC PANEL
ALT: 18 U/L (ref 0–44)
AST: 19 U/L (ref 15–41)
Albumin: 3.9 g/dL (ref 3.5–5.0)
Alkaline Phosphatase: 68 U/L (ref 38–126)
Anion gap: 11 (ref 5–15)
BUN: 16 mg/dL (ref 8–23)
CO2: 24 mmol/L (ref 22–32)
Calcium: 9.2 mg/dL (ref 8.9–10.3)
Chloride: 104 mmol/L (ref 98–111)
Creatinine, Ser: 0.9 mg/dL (ref 0.44–1.00)
GFR calc Af Amer: >60 mL/min (ref >60)
GFR calc non Af Amer: >60 mL/min (ref >60)
Glucose, Bld: 82 mg/dL (ref 70–99)
Potassium: 4 mmol/L (ref 3.5–5.1)
Sodium: 139 mmol/L (ref 135–145)
Total Bilirubin: 0.9 mg/dL (ref 0.3–1.2)
Total Protein: 6.3 g/dL — ABNORMAL LOW (ref 6.5–8.1)

## 2019-02-20 LAB — CBC
HCT: 42.6 % (ref 36.0–46.0)
Hemoglobin: 13.5 g/dL (ref 12.0–15.0)
MCH: 32.5 pg (ref 26.0–34.0)
MCHC: 31.7 g/dL (ref 30.0–36.0)
MCV: 102.7 fL — ABNORMAL HIGH (ref 80.0–100.0)
Platelets: 206 10*3/uL (ref 150–400)
RBC: 4.15 MIL/uL (ref 3.87–5.11)
RDW: 12.1 % (ref 11.5–15.5)
WBC: 6.5 10*3/uL (ref 4.0–10.5)
nRBC: 0 % (ref 0.0–0.2)

## 2019-02-20 LAB — URINALYSIS, ROUTINE W REFLEX MICROSCOPIC
Bilirubin Urine: NEGATIVE
Glucose, UA: NEGATIVE mg/dL
Hgb urine dipstick: NEGATIVE
Ketones, ur: NEGATIVE mg/dL
Leukocytes,Ua: NEGATIVE
Nitrite: NEGATIVE
Protein, ur: NEGATIVE mg/dL
Specific Gravity, Urine: >1.03 — ABNORMAL HIGH (ref 1.005–1.030)
pH: 5.5 (ref 5.0–8.0)

## 2019-02-20 LAB — SURGICAL PCR SCREEN
MRSA, PCR: NEGATIVE
Staphylococcus aureus: NEGATIVE

## 2019-02-20 NOTE — Pre-Procedure Instructions (Signed)
Kathy Cook  02/20/2019      [facility] [phone] - [place], [place] - [address] AT [address] [place] [address] Phone: [phone] Fax: [phone]    Your procedure is scheduled on March 04, 2019.  Report to [facility] Entrance "A" at 1030 AM.  Call this number if you have problems the morning of surgery:  [phone]   Call [phone] if you have any questions prior to your surgery date Monday-Friday 8am-4pm    Remember:  Do not eat after midnight.    You can have clear liquids until 9:30A.M. the day of surgery.Clear Liquids are: Water, juice( non-citric and without pulp), carbonated beverages, clear tea, black coffee only, and gatorade   Please complete your PRE-SURGERY ENSURE that was provided to you by 9:30 the morning of surgery.  Please, if able, drink it in one setting. DO NOT SIP.  Take these medicines the morning of surgery with A SIP OF WATER  -none  Do Not take Adderall the day of surgery  7 days prior to surgery STOP taking any Aspirin (unless otherwise instructed by your surgeon), Aleve, Naproxen, Ibuprofen, Motrin, Advil, Goody's, BC's, all herbal medications, fish oil, and all vitamins   Day of surgery:  Do not wear jewelry, make-up or nail polish.  Do not wear lotions, powders, or perfumes, or deodorant.  Do not shave 48 hours prior to surgery.    Do not bring valuables to the hospital.  [facility] is not responsible for any belongings or valuables.  Contacts, dentures or bridgework may not be worn into surgery.  Leave your suitcase in the car.  After surgery it may be brought to your room.  For patients admitted to the hospital, discharge time will be determined by your treatment team.  Patients discharged the day of surgery will not be allowed to drive home.    [facility]- Preparing For Surgery  Before surgery, you can play an important role. Because skin is not  sterile, your skin needs to be as free of germs as possible. You can reduce the number of germs on your skin by washing with CHG (chlorahexidine gluconate) Soap before surgery.  CHG is an antiseptic cleaner which kills germs and bonds with the skin to continue killing germs even after washing.    Oral Hygiene is also important to reduce your risk of infection.  Remember - BRUSH YOUR TEETH THE MORNING OF SURGERY WITH YOUR REGULAR TOOTHPASTE  Please do not use if you have an allergy to CHG or antibacterial soaps. If your skin becomes reddened/irritated stop using the CHG.  Do not shave (including legs and underarms) for at least 48 hours prior to first CHG shower. It is OK to shave your face.  Please follow these instructions carefully.   1. Shower the NIGHT BEFORE SURGERY and the MORNING OF SURGERY with CHG.   2. If you chose to wash your hair, wash your hair first as usual with your normal shampoo.  3. After you shampoo, rinse your hair and body thoroughly to remove the shampoo.  4. Use CHG as you would any other liquid soap. You can apply CHG directly to the skin and wash gently with a scrungie or a clean washcloth.   5. Apply the CHG Soap to your body ONLY FROM THE NECK DOWN.  Do not use on open wounds or open sores. Avoid contact with your eyes, ears, mouth and genitals (private parts).  Wash Face and genitals (private parts)  with your normal soap.  6. Wash thoroughly, paying special attention to the area where your surgery will be performed.  7. Thoroughly rinse your body with warm water from the neck down.  8. DO NOT shower/wash with your normal soap after using and rinsing off the CHG Soap.  9. Pat yourself dry with a CLEAN TOWEL.  10. Wear CLEAN PAJAMAS to bed the night before surgery, wear comfortable clothes the morning of surgery  11. Place CLEAN SHEETS on your bed the night of your first shower and DO NOT SLEEP WITH PETS.  Day of Surgery: Shower with CHG as instructed  above Do not apply any deodorants/lotions.  Please wear clean clothes to the hospital/surgery center.   Remember to brush your teeth WITH YOUR REGULAR TOOTHPASTE.  Please read over the following fact sheets that you were given.

## 2019-02-20 NOTE — Progress Notes (Signed)
PCP - Magnus Sinning Cardiologist - na  Chest x-ray - 02/20/19 EKG - 04/30/18 Stress Test - na ECHO - na Cardiac Cath - na  Sleep Study - na CPAP -   Fasting Blood Sugar - na Checks Blood Sugar _____ times a day  Blood Thinner Instructions:na Aspirin Instructions:  ERAS Protcol -yes PRE-SURGERY Ensure given  COVID TEST- 03/02/19   Anesthesia review:   Patient denies shortness of breath, fever, cough and chest pain at PAT appointment   All instructions explained to the patient, with a verbal understanding of the material. Patient agrees to go over the instructions while at home for a better understanding. Patient also instructed to self quarantine after being tested for COVID-19. The opportunity to ask questions was provided.

## 2019-02-27 ENCOUNTER — Encounter: Payer: Self-pay | Admitting: Surgery

## 2019-02-27 ENCOUNTER — Telehealth: Payer: Self-pay | Admitting: *Deleted

## 2019-02-27 ENCOUNTER — Other Ambulatory Visit: Payer: Self-pay

## 2019-02-27 ENCOUNTER — Ambulatory Visit: Payer: Medicare Other | Admitting: Surgery

## 2019-02-27 VITALS — BP 122/82 | HR 64

## 2019-02-27 DIAGNOSIS — M1711 Unilateral primary osteoarthritis, right knee: Secondary | ICD-10-CM

## 2019-02-27 NOTE — Care Plan (Signed)
RNCM met with patient today in office for her H&P appointment with Benjiman Core, PA-C. She is scheduled for a Right TKA with Dr. Lorin Mercy on Monday, 03/04/19. Reviewed Ortho bundle through [facility] and provided opportunity for patient to ask questions related to pre-surgery and post-surgery care. She lives with a spouse and will have assistance after discharge from the hospital. She will need a FWW and declines a 3in1/BSC. Anticipate HHPT will be needed after short hospital stay. Choice provided. Referral made to [facility]. OPPT also anticipated to begin around 2 weeks post-op. CM will make referral and schedule with Marga Hoots (Here at this office) to begin around that time. Will continue to provide CM through episode of care.

## 2019-02-27 NOTE — Telephone Encounter (Signed)
Ortho bundle pre-op call completed. 

## 2019-02-27 NOTE — Progress Notes (Signed)
73 year old white female history of end-stage DJD right knee and pain comes in for preop evaluation.  Right knee symptoms unchanged from previous visit.  She is wanting to proceed with right total knee replacement as scheduled.  Today history and physical performed and will be placed in patient's chart.  Surgical procedure along with potential rehab/recovery time discussed in great detail.  All questions answered and she wishes to proceed.

## 2019-02-28 ENCOUNTER — Other Ambulatory Visit: Payer: Self-pay | Admitting: *Deleted

## 2019-02-28 DIAGNOSIS — M1711 Unilateral primary osteoarthritis, right knee: Secondary | ICD-10-CM

## 2019-02-28 MED ORDER — BUPIVACAINE LIPOSOME 1.3 % IJ SUSP
20.0000 mL | Freq: Once | INTRAMUSCULAR | Status: DC
  Filled 2019-02-28: qty 20

## 2019-03-02 ENCOUNTER — Other Ambulatory Visit (HOSPITAL_COMMUNITY)
Admission: RE | Admit: 2019-03-02 | Discharge: 2019-03-02 | Disposition: A | Discharge status: Home / Self Care | Payer: Medicare Other | Source: Ambulatory Visit | Attending: Orthopaedic Surgery | Admitting: Orthopaedic Surgery

## 2019-03-02 DIAGNOSIS — Z96641 Presence of right artificial hip joint: Secondary | ICD-10-CM | POA: Diagnosis not present

## 2019-03-02 DIAGNOSIS — Z6834 Body mass index (BMI) 34.0-34.9, adult: Secondary | ICD-10-CM | POA: Diagnosis not present

## 2019-03-02 DIAGNOSIS — Z823 Family history of stroke: Secondary | ICD-10-CM | POA: Diagnosis not present

## 2019-03-02 DIAGNOSIS — Z833 Family history of diabetes mellitus: Secondary | ICD-10-CM | POA: Diagnosis not present

## 2019-03-02 DIAGNOSIS — Z8249 Family history of ischemic heart disease and other diseases of the circulatory system: Secondary | ICD-10-CM | POA: Diagnosis not present

## 2019-03-02 DIAGNOSIS — Z96651 Presence of right artificial knee joint: Secondary | ICD-10-CM | POA: Diagnosis not present

## 2019-03-02 DIAGNOSIS — I10 Essential (primary) hypertension: Secondary | ICD-10-CM | POA: Diagnosis not present

## 2019-03-02 DIAGNOSIS — E559 Vitamin D deficiency, unspecified: Secondary | ICD-10-CM | POA: Diagnosis not present

## 2019-03-02 DIAGNOSIS — M1711 Unilateral primary osteoarthritis, right knee: Secondary | ICD-10-CM | POA: Diagnosis not present

## 2019-03-02 DIAGNOSIS — Z471 Aftercare following joint replacement surgery: Secondary | ICD-10-CM | POA: Diagnosis not present

## 2019-03-02 DIAGNOSIS — Z20822 Contact with and (suspected) exposure to covid-19: Secondary | ICD-10-CM | POA: Insufficient documentation

## 2019-03-02 DIAGNOSIS — G8918 Other acute postprocedural pain: Secondary | ICD-10-CM | POA: Diagnosis not present

## 2019-03-02 DIAGNOSIS — E669 Obesity, unspecified: Secondary | ICD-10-CM | POA: Diagnosis present

## 2019-03-02 DIAGNOSIS — Z87891 Personal history of nicotine dependence: Secondary | ICD-10-CM | POA: Diagnosis not present

## 2019-03-02 DIAGNOSIS — Z79890 Hormone replacement therapy (postmenopausal): Secondary | ICD-10-CM | POA: Diagnosis not present

## 2019-03-02 DIAGNOSIS — Z01812 Encounter for preprocedural laboratory examination: Secondary | ICD-10-CM | POA: Insufficient documentation

## 2019-03-03 LAB — SARS CORONAVIRUS 2 (TAT 6-24 HRS): SARS Coronavirus 2: NEGATIVE

## 2019-03-04 ENCOUNTER — Encounter (HOSPITAL_COMMUNITY)
Admission: AD | Disposition: A | Discharge status: Home Health | Payer: Self-pay | Source: Home / Self Care | Attending: Orthopaedic Surgery

## 2019-03-04 ENCOUNTER — Encounter (HOSPITAL_COMMUNITY): Payer: Self-pay | Admitting: Orthopaedic Surgery

## 2019-03-04 ENCOUNTER — Observation Stay (HOSPITAL_COMMUNITY): Payer: Medicare Other

## 2019-03-04 ENCOUNTER — Ambulatory Visit (HOSPITAL_COMMUNITY): Payer: Medicare Other | Admitting: Anesthesiology

## 2019-03-04 ENCOUNTER — Inpatient Hospital Stay (HOSPITAL_COMMUNITY)
Admission: AD | Admit: 2019-03-04 | Discharge: 2019-03-06 | DRG: 470 | Disposition: A | Discharge status: Home Health | Payer: Medicare Other | Attending: Orthopaedic Surgery | Admitting: Orthopaedic Surgery

## 2019-03-04 ENCOUNTER — Other Ambulatory Visit: Payer: Self-pay

## 2019-03-04 DIAGNOSIS — Z823 Family history of stroke: Secondary | ICD-10-CM

## 2019-03-04 DIAGNOSIS — Z6834 Body mass index (BMI) 34.0-34.9, adult: Secondary | ICD-10-CM

## 2019-03-04 DIAGNOSIS — Z96651 Presence of right artificial knee joint: Secondary | ICD-10-CM | POA: Diagnosis not present

## 2019-03-04 DIAGNOSIS — Z20822 Contact with and (suspected) exposure to covid-19: Secondary | ICD-10-CM | POA: Diagnosis not present

## 2019-03-04 DIAGNOSIS — I10 Essential (primary) hypertension: Secondary | ICD-10-CM | POA: Diagnosis not present

## 2019-03-04 DIAGNOSIS — M1711 Unilateral primary osteoarthritis, right knee: Principal | ICD-10-CM

## 2019-03-04 DIAGNOSIS — Z96641 Presence of right artificial hip joint: Secondary | ICD-10-CM | POA: Diagnosis not present

## 2019-03-04 DIAGNOSIS — Z09 Encounter for follow-up examination after completed treatment for conditions other than malignant neoplasm: Secondary | ICD-10-CM

## 2019-03-04 DIAGNOSIS — Z833 Family history of diabetes mellitus: Secondary | ICD-10-CM

## 2019-03-04 DIAGNOSIS — Z87891 Personal history of nicotine dependence: Secondary | ICD-10-CM

## 2019-03-04 DIAGNOSIS — G8918 Other acute postprocedural pain: Secondary | ICD-10-CM | POA: Diagnosis not present

## 2019-03-04 DIAGNOSIS — Z471 Aftercare following joint replacement surgery: Secondary | ICD-10-CM | POA: Diagnosis not present

## 2019-03-04 DIAGNOSIS — E669 Obesity, unspecified: Secondary | ICD-10-CM | POA: Diagnosis present

## 2019-03-04 DIAGNOSIS — Z8249 Family history of ischemic heart disease and other diseases of the circulatory system: Secondary | ICD-10-CM

## 2019-03-04 DIAGNOSIS — E559 Vitamin D deficiency, unspecified: Secondary | ICD-10-CM | POA: Diagnosis not present

## 2019-03-04 DIAGNOSIS — Z79890 Hormone replacement therapy (postmenopausal): Secondary | ICD-10-CM

## 2019-03-04 HISTORY — PX: TOTAL KNEE ARTHROPLASTY: SHX125

## 2019-03-04 SURGERY — ARTHROPLASTY, KNEE, TOTAL
Anesthesia: Monitor Anesthesia Care | Site: Knee | Laterality: Right

## 2019-03-04 MED ORDER — MENTHOL 3 MG MT LOZG: 1.0000 | LOZENGE | OROMUCOSAL | Status: DC | PRN

## 2019-03-04 MED ORDER — BUPIVACAINE HCL (PF) 0.25 % IJ SOLN
INTRAMUSCULAR | Status: AC
  Filled 2019-03-04: qty 30

## 2019-03-04 MED ORDER — FENTANYL CITRATE (PF) 100 MCG/2ML IJ SOLN
INTRAMUSCULAR | Status: AC
  Administered 2019-03-04: 50 ug via INTRAVENOUS
  Filled 2019-03-04: qty 2

## 2019-03-04 MED ORDER — AMPHETAMINE-DEXTROAMPHETAMINE 30 MG PO TABS: 15.0000 mg | ORAL_TABLET | ORAL | Status: DC

## 2019-03-04 MED ORDER — OXYCODONE HCL 5 MG PO TABS
5.0000 mg | ORAL_TABLET | ORAL | Status: DC | PRN
  Administered 2019-03-04: 5 mg via ORAL
  Filled 2019-03-04: qty 1

## 2019-03-04 MED ORDER — PROGESTERONE MICRONIZED 10 % TD CREA: TOPICAL_CREAM | Freq: Every day | TRANSDERMAL | Status: DC

## 2019-03-04 MED ORDER — EPHEDRINE 5 MG/ML INJ
INTRAVENOUS | Status: AC
  Filled 2019-03-04: qty 10

## 2019-03-04 MED ORDER — DOCUSATE SODIUM 100 MG PO CAPS
100.0000 mg | ORAL_CAPSULE | Freq: Two times a day (BID) | ORAL | Status: DC
  Administered 2019-03-04 – 2019-03-06 (×4): 100 mg via ORAL
  Filled 2019-03-04 (×4): qty 1

## 2019-03-04 MED ORDER — MIDAZOLAM HCL 2 MG/2ML IJ SOLN: 1.0000 mg | Freq: Once | INTRAMUSCULAR | Status: AC

## 2019-03-04 MED ORDER — METOCLOPRAMIDE HCL 5 MG/ML IJ SOLN: 5.0000 mg | Freq: Three times a day (TID) | INTRAMUSCULAR | Status: DC | PRN

## 2019-03-04 MED ORDER — ONDANSETRON HCL 4 MG PO TABS: 4.0000 mg | ORAL_TABLET | Freq: Four times a day (QID) | ORAL | Status: DC | PRN

## 2019-03-04 MED ORDER — MIDAZOLAM HCL 2 MG/2ML IJ SOLN
INTRAMUSCULAR | Status: AC
  Filled 2019-03-04: qty 2

## 2019-03-04 MED ORDER — ONDANSETRON HCL 4 MG/2ML IJ SOLN
INTRAMUSCULAR | Status: AC
  Filled 2019-03-04: qty 2

## 2019-03-04 MED ORDER — FENTANYL CITRATE (PF) 100 MCG/2ML IJ SOLN
INTRAMUSCULAR | Status: DC | PRN
  Administered 2019-03-04: 50 ug via INTRAVENOUS

## 2019-03-04 MED ORDER — CHLORHEXIDINE GLUCONATE 4 % EX LIQD: 60.0000 mL | Freq: Once | CUTANEOUS | Status: DC

## 2019-03-04 MED ORDER — METHOCARBAMOL 1000 MG/10ML IJ SOLN
500.0000 mg | Freq: Four times a day (QID) | INTRAVENOUS | Status: DC | PRN
  Filled 2019-03-04: qty 5

## 2019-03-04 MED ORDER — HYDROMORPHONE HCL 1 MG/ML IJ SOLN
0.5000 mg | INTRAMUSCULAR | Status: DC | PRN
  Administered 2019-03-05 (×3): 0.5 mg via INTRAVENOUS
  Filled 2019-03-04 (×3): qty 1

## 2019-03-04 MED ORDER — BUPIVACAINE LIPOSOME 1.3 % IJ SUSP
INTRAMUSCULAR | Status: DC | PRN
  Administered 2019-03-04: 20 mL

## 2019-03-04 MED ORDER — METOCLOPRAMIDE HCL 5 MG/ML IJ SOLN: 10.0000 mg | Freq: Once | INTRAMUSCULAR | Status: DC | PRN

## 2019-03-04 MED ORDER — PROPOFOL 10 MG/ML IV BOLUS
INTRAVENOUS | Status: AC
  Filled 2019-03-04: qty 20

## 2019-03-04 MED ORDER — ROPIVACAINE HCL 7.5 MG/ML IJ SOLN
INTRAMUSCULAR | Status: DC | PRN
  Administered 2019-03-04: 20 mL via PERINEURAL

## 2019-03-04 MED ORDER — METHOCARBAMOL 500 MG PO TABS
500.0000 mg | ORAL_TABLET | Freq: Four times a day (QID) | ORAL | Status: DC | PRN
  Administered 2019-03-04 – 2019-03-05 (×3): 500 mg via ORAL
  Filled 2019-03-04 (×3): qty 1

## 2019-03-04 MED ORDER — TRANEXAMIC ACID-NACL 1000-0.7 MG/100ML-% IV SOLN
1000.0000 mg | INTRAVENOUS | Status: AC
  Administered 2019-03-04: 1000 mg via INTRAVENOUS
  Filled 2019-03-04: qty 100

## 2019-03-04 MED ORDER — FENTANYL CITRATE (PF) 250 MCG/5ML IJ SOLN
INTRAMUSCULAR | Status: AC
  Filled 2019-03-04: qty 5

## 2019-03-04 MED ORDER — LIDOCAINE HCL (CARDIAC) PF 100 MG/5ML IV SOSY
PREFILLED_SYRINGE | INTRAVENOUS | Status: DC | PRN
  Administered 2019-03-04 (×2): 50 mg via INTRATRACHEAL

## 2019-03-04 MED ORDER — MEPERIDINE HCL 25 MG/ML IJ SOLN: 6.2500 mg | INTRAMUSCULAR | Status: DC | PRN

## 2019-03-04 MED ORDER — BUPIVACAINE HCL (PF) 0.25 % IJ SOLN
INTRAMUSCULAR | Status: DC | PRN
  Administered 2019-03-04: 20 mL

## 2019-03-04 MED ORDER — ACETAMINOPHEN 325 MG PO TABS: 325.0000 mg | ORAL_TABLET | Freq: Four times a day (QID) | ORAL | Status: DC | PRN

## 2019-03-04 MED ORDER — TRANEXAMIC ACID-NACL 1000-0.7 MG/100ML-% IV SOLN
INTRAVENOUS | Status: AC
  Filled 2019-03-04: qty 100

## 2019-03-04 MED ORDER — 0.9 % SODIUM CHLORIDE (POUR BTL) OPTIME
TOPICAL | Status: DC | PRN
  Administered 2019-03-04: 10:00:00 1000 mL

## 2019-03-04 MED ORDER — LACTATED RINGERS IV SOLN: INTRAVENOUS | Status: DC

## 2019-03-04 MED ORDER — PROPOFOL 500 MG/50ML IV EMUL
INTRAVENOUS | Status: DC | PRN
  Administered 2019-03-04: 35 ug/kg/min via INTRAVENOUS

## 2019-03-04 MED ORDER — MIDAZOLAM HCL 5 MG/5ML IJ SOLN
INTRAMUSCULAR | Status: DC | PRN
  Administered 2019-03-04 (×2): 1 mg via INTRAVENOUS

## 2019-03-04 MED ORDER — SODIUM CHLORIDE 0.9 % IV SOLN: INTRAVENOUS | Status: DC

## 2019-03-04 MED ORDER — POLYETHYLENE GLYCOL 3350 17 G PO PACK: 17.0000 g | PACK | Freq: Every day | ORAL | Status: DC | PRN

## 2019-03-04 MED ORDER — MIDAZOLAM HCL 2 MG/2ML IJ SOLN
INTRAMUSCULAR | Status: AC
  Administered 2019-03-04: 1 mg via INTRAVENOUS
  Filled 2019-03-04: qty 2

## 2019-03-04 MED ORDER — FENTANYL CITRATE (PF) 100 MCG/2ML IJ SOLN
INTRAMUSCULAR | Status: AC
  Filled 2019-03-04: qty 2

## 2019-03-04 MED ORDER — OXYCODONE HCL 5 MG PO TABS
5.0000 mg | ORAL_TABLET | ORAL | Status: DC | PRN
  Administered 2019-03-04: 10 mg via ORAL
  Administered 2019-03-04: 5 mg via ORAL
  Administered 2019-03-04 – 2019-03-06 (×4): 10 mg via ORAL
  Filled 2019-03-04: qty 1
  Filled 2019-03-04 (×5): qty 2

## 2019-03-04 MED ORDER — FENTANYL CITRATE (PF) 100 MCG/2ML IJ SOLN
25.0000 ug | INTRAMUSCULAR | Status: DC | PRN
  Administered 2019-03-04: 50 ug via INTRAVENOUS

## 2019-03-04 MED ORDER — METOCLOPRAMIDE HCL 5 MG PO TABS: 5.0000 mg | ORAL_TABLET | Freq: Three times a day (TID) | ORAL | Status: DC | PRN

## 2019-03-04 MED ORDER — CEFAZOLIN SODIUM-DEXTROSE 2-4 GM/100ML-% IV SOLN
2.0000 g | INTRAVENOUS | Status: AC
  Administered 2019-03-04: 2 g via INTRAVENOUS
  Filled 2019-03-04: qty 100

## 2019-03-04 MED ORDER — FENTANYL CITRATE (PF) 100 MCG/2ML IJ SOLN: 50.0000 ug | Freq: Once | INTRAMUSCULAR | Status: AC

## 2019-03-04 MED ORDER — PHENYLEPHRINE 40 MCG/ML (10ML) SYRINGE FOR IV PUSH (FOR BLOOD PRESSURE SUPPORT)
PREFILLED_SYRINGE | INTRAVENOUS | Status: AC
  Filled 2019-03-04: qty 10

## 2019-03-04 MED ORDER — ASPIRIN EC 325 MG PO TBEC
325.0000 mg | DELAYED_RELEASE_TABLET | Freq: Every day | ORAL | Status: DC
  Administered 2019-03-05 – 2019-03-06 (×2): 325 mg via ORAL
  Filled 2019-03-04 (×2): qty 1

## 2019-03-04 MED ORDER — SODIUM CHLORIDE 0.9 % IR SOLN
Status: DC | PRN
  Administered 2019-03-04: 3000 mL

## 2019-03-04 MED ORDER — EPHEDRINE SULFATE 50 MG/ML IJ SOLN
INTRAMUSCULAR | Status: DC | PRN
  Administered 2019-03-04 (×2): 10 mg via INTRAVENOUS

## 2019-03-04 MED ORDER — ONDANSETRON HCL 4 MG/2ML IJ SOLN
INTRAMUSCULAR | Status: DC | PRN
  Administered 2019-03-04: 4 mg via INTRAVENOUS

## 2019-03-04 MED ORDER — BUPIVACAINE IN DEXTROSE 0.75-8.25 % IT SOLN
INTRATHECAL | Status: DC | PRN
  Administered 2019-03-04: 1.4 mL via INTRATHECAL

## 2019-03-04 MED ORDER — PHENYLEPHRINE HCL-NACL 10-0.9 MG/250ML-% IV SOLN
INTRAVENOUS | Status: DC | PRN
  Administered 2019-03-04: 35 ug/min via INTRAVENOUS

## 2019-03-04 MED ORDER — PHENOL 1.4 % MT LIQD: 1.0000 | OROMUCOSAL | Status: DC | PRN

## 2019-03-04 MED ORDER — PHENYLEPHRINE HCL-NACL 10-0.9 MG/250ML-% IV SOLN
INTRAVENOUS | Status: AC
  Filled 2019-03-04: qty 500

## 2019-03-04 MED ORDER — ONDANSETRON HCL 4 MG/2ML IJ SOLN: 4.0000 mg | Freq: Four times a day (QID) | INTRAMUSCULAR | Status: DC | PRN

## 2019-03-04 MED ORDER — LIDOCAINE 2% (20 MG/ML) 5 ML SYRINGE
INTRAMUSCULAR | Status: AC
  Filled 2019-03-04: qty 10

## 2019-03-04 SURGICAL SUPPLY — 73 items
ATTUNE PS FEM RT SZ 5 CEM KNEE (Femur) ×3 IMPLANT
ATTUNE PSRP INSR SZ5 5 KNEE (Insert) ×2 IMPLANT
ATTUNE PSRP INSR SZ5 5MM KNEE (Insert) ×1 IMPLANT
BANDAGE ESMARK 6X9 LF (GAUZE/BANDAGES/DRESSINGS) ×1 IMPLANT
BASEPLATE TIBIAL ROTATING SZ 4 (Knees) ×3 IMPLANT
BENZOIN TINCTURE PRP APPL 2/3 (GAUZE/BANDAGES/DRESSINGS) ×3 IMPLANT
BLADE SAGITTAL 25.0X1.19X90 (BLADE) ×2 IMPLANT
BLADE SAGITTAL 25.0X1.19X90MM (BLADE) ×1
BLADE SAW SGTL 13X75X1.27 (BLADE) ×3 IMPLANT
BNDG ELASTIC 4X5.8 VLCR STR LF (GAUZE/BANDAGES/DRESSINGS) ×3 IMPLANT
BNDG ELASTIC 6X10 VLCR STRL LF (GAUZE/BANDAGES/DRESSINGS) ×3 IMPLANT
BNDG ESMARK 6X9 LF (GAUZE/BANDAGES/DRESSINGS) ×3
BOWL SMART MIX CTS (DISPOSABLE) ×3 IMPLANT
CEMENT HV SMART SET (Cement) ×6 IMPLANT
CLOSURE STERI-STRIP 1/2X4 (GAUZE/BANDAGES/DRESSINGS) ×1
CLSR STERI-STRIP ANTIMIC 1/2X4 (GAUZE/BANDAGES/DRESSINGS) ×2 IMPLANT
COVER SURGICAL LIGHT HANDLE (MISCELLANEOUS) ×3 IMPLANT
COVER WAND RF STERILE (DRAPES) ×3 IMPLANT
CUFF TOURN SGL QUICK 34 (TOURNIQUET CUFF) ×2
CUFF TOURN SGL QUICK 42 (TOURNIQUET CUFF) IMPLANT
CUFF TRNQT CYL 34X4.125X (TOURNIQUET CUFF) ×1 IMPLANT
DRAPE ORTHO SPLIT 77X108 STRL (DRAPES) ×4
DRAPE SURG ORHT 6 SPLT 77X108 (DRAPES) ×2 IMPLANT
DRAPE U-SHAPE 47X51 STRL (DRAPES) ×3 IMPLANT
DRSG PAD ABDOMINAL 8X10 ST (GAUZE/BANDAGES/DRESSINGS) ×3 IMPLANT
DURAPREP 26ML APPLICATOR (WOUND CARE) ×6 IMPLANT
ELECT REM PT RETURN 9FT ADLT (ELECTROSURGICAL) ×3
ELECTRODE REM PT RTRN 9FT ADLT (ELECTROSURGICAL) ×1 IMPLANT
EVACUATOR 1/8 PVC DRAIN (DRAIN) IMPLANT
FACESHIELD WRAPAROUND (MASK) ×6 IMPLANT
GAUZE SPONGE 4X4 12PLY STRL (GAUZE/BANDAGES/DRESSINGS) ×3 IMPLANT
GAUZE XEROFORM 5X9 LF (GAUZE/BANDAGES/DRESSINGS) ×3 IMPLANT
GLOVE BIOGEL PI IND STRL 8 (GLOVE) ×2 IMPLANT
GLOVE BIOGEL PI INDICATOR 8 (GLOVE) ×4
GLOVE ORTHO TXT STRL SZ7.5 (GLOVE) ×6 IMPLANT
GOWN STRL REUS W/ TWL LRG LVL3 (GOWN DISPOSABLE) ×1 IMPLANT
GOWN STRL REUS W/ TWL XL LVL3 (GOWN DISPOSABLE) ×1 IMPLANT
GOWN STRL REUS W/TWL 2XL LVL3 (GOWN DISPOSABLE) ×3 IMPLANT
GOWN STRL REUS W/TWL LRG LVL3 (GOWN DISPOSABLE) ×2
GOWN STRL REUS W/TWL XL LVL3 (GOWN DISPOSABLE) ×2
HANDPIECE INTERPULSE COAX TIP (DISPOSABLE) ×2
IMMOBILIZER KNEE 22 UNIV (SOFTGOODS) ×3 IMPLANT
KIT BASIN OR (CUSTOM PROCEDURE TRAY) ×3 IMPLANT
KIT TURNOVER KIT B (KITS) ×3 IMPLANT
MANIFOLD NEPTUNE II (INSTRUMENTS) ×3 IMPLANT
MARKER SKIN DUAL TIP RULER LAB (MISCELLANEOUS) ×3 IMPLANT
NEEDLE 18GX1X1/2 (RX/OR ONLY) (NEEDLE) ×3 IMPLANT
NEEDLE HYPO 25GX1X1/2 BEV (NEEDLE) ×3 IMPLANT
NS IRRIG 1000ML POUR BTL (IV SOLUTION) ×3 IMPLANT
PACK TOTAL JOINT (CUSTOM PROCEDURE TRAY) ×3 IMPLANT
PAD ABD 8X10 STRL (GAUZE/BANDAGES/DRESSINGS) ×3 IMPLANT
PAD ARMBOARD 7.5X6 YLW CONV (MISCELLANEOUS) ×6 IMPLANT
PAD CAST 4YDX4 CTTN HI CHSV (CAST SUPPLIES) ×1 IMPLANT
PADDING CAST COTTON 4X4 STRL (CAST SUPPLIES) ×2
PADDING CAST COTTON 6X4 STRL (CAST SUPPLIES) ×3 IMPLANT
PATELLA MEDIAL ATTUN 35MM KNEE (Knees) ×3 IMPLANT
PIN STEINMAN FIXATION KNEE (PIN) ×3 IMPLANT
SET HNDPC FAN SPRY TIP SCT (DISPOSABLE) ×1 IMPLANT
STAPLER VISISTAT 35W (STAPLE) IMPLANT
SUCTION FRAZIER HANDLE 10FR (MISCELLANEOUS) ×2
SUCTION TUBE FRAZIER 10FR DISP (MISCELLANEOUS) ×1 IMPLANT
SUT VIC AB 0 CT1 27 (SUTURE) ×2
SUT VIC AB 0 CT1 27XBRD ANBCTR (SUTURE) ×1 IMPLANT
SUT VIC AB 1 CTX 36 (SUTURE) ×4
SUT VIC AB 1 CTX36XBRD ANBCTR (SUTURE) ×2 IMPLANT
SUT VIC AB 2-0 CT1 27 (SUTURE) ×4
SUT VIC AB 2-0 CT1 TAPERPNT 27 (SUTURE) ×2 IMPLANT
SUT VIC AB 3-0 X1 27 (SUTURE) ×3 IMPLANT
SYR 50ML LL SCALE MARK (SYRINGE) ×3 IMPLANT
SYR CONTROL 10ML LL (SYRINGE) ×3 IMPLANT
TOWEL GREEN STERILE (TOWEL DISPOSABLE) ×3 IMPLANT
TOWEL GREEN STERILE FF (TOWEL DISPOSABLE) ×3 IMPLANT
TRAY CATH 16FR W/PLASTIC CATH (SET/KITS/TRAYS/PACK) IMPLANT

## 2019-03-04 NOTE — Transfer of Care (Signed)
Immediate Anesthesia Transfer of Care Note  Patient: Kathy Cook  Procedure(s) Performed: RIGHT TOTAL KNEE ARTHROPLASTY (Right Knee)  Patient Location: PACU  Anesthesia Type:MAC combined with regional for post-op pain  Level of Consciousness: awake, alert , oriented and patient cooperative  Airway & Oxygen Therapy: Patient Spontanous Breathing  Post-op Assessment: Report given to RN and Post -op Vital signs reviewed and stable  Post vital signs: Reviewed and stable  Last Vitals:  Vitals Value Taken Time  BP 107/59 03/04/19 1205  Temp    Pulse 70 03/04/19 1207  Resp 19 03/04/19 1207  SpO2 95 % 03/04/19 1207  Vitals shown include unvalidated device data.  Last Pain:  Vitals:   03/04/19 0857  TempSrc:   PainSc: 0-No pain         Complications: No apparent anesthesia complications

## 2019-03-04 NOTE — Progress Notes (Signed)
Orthopedic Tech Progress Note Patient Details:  Kathy Cook 1945/12/26 [id number]  CPM Right Knee CPM Right Knee: On Right Knee Flexion (Degrees): 90 Right Knee Extension (Degrees): 0 Additional Comments: foot roll  Post Interventions Patient Tolerated: Well Instructions Provided: Care of device  [name] 03/04/2019, 12:58 PM

## 2019-03-04 NOTE — H&P (Signed)
TOTAL KNEE ADMISSION H&P  Patient is being admitted for right total knee arthroplasty.  Subjective:  Chief Complaint:right knee pain.  HPI: Kathy Cook, 74 y.o. female, has a history of pain and functional disability in the right knee due to arthritis and has failed non-surgical conservative treatments for greater than 12 weeks to includeNSAID's and/or analgesics, corticosteriod injections, use of assistive devices and activity modification.  Onset of symptoms was gradual, starting 10 years ago with gradually worsening course since that time.   Patient currently rates pain in the right knee(s) at 10 out of 10 with activity. Patient has night pain, worsening of pain with activity and weight bearing, pain that interferes with activities of daily living, pain with passive range of motion, crepitus and joint swelling.  Patient has evidence of subchondral sclerosis, periarticular osteophytes and joint space narrowing by imaging studies.  There is no active infection.  Patient Active Problem List   Diagnosis Date Noted  . Unilateral primary osteoarthritis, right knee 02/05/2019  . Knee locking, right 01/09/2019  . Metatarsal stress fracture, right, initial encounter 10/03/2016  . DJD (degenerative joint disease) 06/09/2016  . Labile hypertension 08/01/2014  . Vitamin D deficiency 08/01/2014  . Medication management 08/01/2014  . Obesity 07/04/2014  . Hyperlipemia 06/27/2013  . Attention deficit disorder (ADD)   . GERD (gastroesophageal reflux disease)   . Allergy   . Depression, major, in remission ([place])   . Anemia   . Ocular rosacea   . HSV infection   . Osteopenia    Past Medical History:  Diagnosis Date  . Allergy   . Anxiety   . Attention deficit disorder (ADD)   . Cancer [facility])    ? basal/squam cell on chest, and forehead  . Depression   . Dry eyes   . GERD (gastroesophageal reflux disease)   . HSV infection   . Lumbar degenerative disc disease   . Ocular rosacea   .  Osteopenia     Past Surgical History:  Procedure Laterality Date  . SKIN BIOPSY     ?basal/squam cell on chest  . TONSILLECTOMY AND ADENOIDECTOMY      Current Facility-Administered Medications  Medication Dose Route Frequency Provider Last Rate Last Admin  . bupivacaine liposome (EXPAREL) 1.3 % injection 266 mg  20 mL Infiltration Once [name], MD       Current Outpatient Medications  Medication Sig Dispense Refill Last Dose  . amphetamine-dextroamphetamine (ADDERALL) 30 MG tablet Take 1/2 to 1 tablet 2 x /day as needed for Focus & Concentration (Patient taking differently: Take 15-30 mg by mouth See admin instructions. Take 1/2 to 1 tablet 2 x /day as needed for Focus & Concentration) 60 tablet 0   . ascorbic acid (VITAMIN C) 500 MG tablet Take 500 mg by mouth daily.     . Cholecalciferol (VITAMIN D3) 5000 UNITS TABS Take 10,000 Units by mouth.     . Colesevelam HCl 3.75 g PACK LET 1 PACKET SIT IN WATER OR ORANGE JUICE THEN MIX AGAIN BEFORE DRINKING ONCE DAILY 90 each 3   . Estradiol-Estriol-Progesterone (BIEST/PROGESTERONE) CREA Place onto the skin. Marland Kitchen05 mg/ml cream to apply daily     . famciclovir (FAMVIR) 500 MG tablet Take 1 tablet (500 mg total) by mouth 2 (two) times daily. (Patient taking differently: Take 500 mg by mouth 2 (two) times daily as needed (fever blisters). ) 60 tablet 0   . Progesterone Micronized 10 % CREA Place onto the skin. Patient applies 2 %  cream daily      Allergies  Allergen Reactions  . Prednisone Nausea And Vomiting  . Statins     paralyzing  . Strattera [Atomoxetine Hcl]     Increased sadness    Social History   Tobacco Use  . Smoking status: Former Smoker    Packs/day: 0.50    Years: 15.00    Pack years: 7.50    Types: Cigarettes    Quit date: 03/23/1980    Years since quitting: 38.9  . Smokeless tobacco: Never Used  Substance Use Topics  . Alcohol use: Yes    Alcohol/week: 0.0 standard drinks    Comment: social    Family History   Problem Relation Age of Onset  . Stroke Father   . Diabetes Father   . Hypertension Mother   . Depression Mother   . Macular degeneration Mother      Review of Systems  Constitutional: Positive for activity change.  HENT: Negative.   Eyes: Negative.   Respiratory: Negative.   Cardiovascular: Negative.   Genitourinary: Negative.   Neurological: Negative.   Hematological: Negative.     Objective:  Physical Exam  Constitutional: She is oriented to person, place, and time. She appears well-nourished. No distress.  HENT:  Head: Normocephalic and atraumatic.  Eyes: Pupils are equal, round, and reactive to light. EOM are normal.  Cardiovascular: Normal heart sounds.  Respiratory: Effort normal and breath sounds normal. No respiratory distress.  GI: Soft. Bowel sounds are normal. She exhibits no distension. There is no abdominal tenderness.  Musculoskeletal:        General: Tenderness present.     Cervical back: Normal range of motion.  Neurological: [name], place, and time.  Skin: Skin is warm and dry.  Psychiatric: She has a normal mood and affect.    Vital signs in last 24 hours:    Labs:   Estimated body mass index is 35.14 kg/m as calculated from the following:   Height as of 02/20/19: 5\' 2"  (1.575 m).   Weight as of 02/20/19: 87.1 kg.   Imaging Review Plain radiographs demonstrate moderate degenerative joint disease of the right knee(s). The overall alignment isneutral. The bone quality appears to be good for age and reported activity level.      Assessment/Plan:  End stage arthritis, right knee   The patient history, physical examination, clinical judgment of the provider and imaging studies are consistent with end stage degenerative joint disease of the right knee(s) and total knee arthroplasty is deemed medically necessary. The treatment options including medical management, injection therapy arthroscopy and arthroplasty were  discussed at length. The risks and benefits of total knee arthroplasty were presented and reviewed. The risks due to aseptic loosening, infection, stiffness, patella tracking problems, thromboembolic complications and other imponderables were discussed. The patient acknowledged the explanation, agreed to proceed with the plan and consent was signed. Patient is being admitted for inpatient treatment for surgery, pain control, PT, OT, prophylactic antibiotics, VTE prophylaxis, progressive ambulation and ADL's and discharge planning. The patient is planning to be discharged home with home health services    Anticipated LOS equal to or greater than 2 midnights due to - Age 18 and older with one or more of the following:  - Obesity  - Expected need for hospital services (PT, OT, Nursing) required for safe  discharge  - Anticipated need for postoperative skilled nursing care or inpatient rehab  - Active co-morbidities: None OR   -  Unanticipated findings during/Post Surgery: None  - Patient is a high risk of re-admission due to: None

## 2019-03-04 NOTE — Evaluation (Signed)
Physical Therapy Evaluation Patient Details Name: Kathy Cook MRN: [id number] DOB: [birth year] Today's Date: 03/04/2019   History of Present Illness  Patient is a 74 y/o female admitted for RTKA.  PMH positive for HTN, ADD, GERD, HSV, ocular rosacea.  Clinical Impression  Patient presents with decreased mobility due to R LE pain, decreased AROM, decreased strength and will benefit from skilled PT in the acute setting to allow d/c home with family support.  Currently min A overall for OOB to BSC due to R LE buckling.  Previously was independent without devices.  Follow up PT recommended for continued progression of R LE ROM/strength and maximized mobility and safety.     Follow Up Recommendations Follow surgeon's recommendation for DC plan and follow-up therapies    Equipment Recommendations  Rolling walker with 5" wheels;3in1 (PT)    Recommendations for Other Services       Precautions / Restrictions Precautions Precautions: Fall Required Braces or Orthoses: Knee Immobilizer - Right Restrictions Weight Bearing Restrictions: Yes RLE Weight Bearing: Weight bearing as tolerated      Mobility  Bed Mobility Overal bed mobility: Needs Assistance Bed Mobility: Supine to Sit;Sit to Supine     Supine to sit: HOB elevated;Supervision Sit to supine: Min assist   General bed mobility comments: assist with R LE to supine  Transfers Overall transfer level: Needs assistance Equipment used: Rolling walker (2 wheeled) Transfers: Sit to/from [facility] Sit to Stand: Min assist Stand pivot transfers: Mod assist;Min assist       General transfer comment: assist up from EOB for balance, then R knee buckled attempting to ambulate to bathroom, so returned to sitting with assist for safety, placed BSC for SPT w/RW and mod cues for "shimmy" steps with L to avoid R LE buckling.  Ambulation/Gait                Stairs            Wheelchair Mobility     Modified Rankin (Stroke Patients Only)       Balance Overall balance assessment: Needs assistance   Sitting balance-Leahy Scale: Good     Standing balance support: Bilateral upper extremity supported Standing balance-Leahy Scale: Poor Standing balance comment: UE support for balance                             Pertinent Vitals/Pain Pain Assessment: 0-10 Pain Score: 4  Pain Location: R knee Pain Descriptors / Indicators: Aching Pain Intervention(s): Monitored during session;Repositioned    Home Living Family/patient expects to be discharged to:: Private residence Living Arrangements: Spouse/significant other Available Help at Discharge: Family Type of Home: House Home Access: Level entry     Home Layout: Two level Home Equipment: None      Prior Function Level of Independence: Independent         Comments: retired     Journalist, newspaper        Extremity/Trunk Assessment   Upper Extremity Assessment Upper Extremity Assessment: Overall WFL for tasks assessed    Lower Extremity Assessment Lower Extremity Assessment: RLE deficits/detail RLE Deficits / Details: AAROM knee flexion about 30-40 degrees in ace wrap, able to activate quads, but not to sustain standing due to spinal; ankle AROM WFL       Communication   Communication: No difficulties  Cognition Arousal/Alertness: Awake/alert Behavior During Therapy: WFL for tasks assessed/performed Overall Cognitive Status: Within Functional Limits for tasks assessed  General Comments      Exercises Total Joint Exercises Ankle Circles/Pumps: AROM;5 reps;Supine;Both Quad Sets: AROM;Right;5 reps;Supine Heel Slides: AAROM;5 reps;Supine;Right   Assessment/Plan    PT Assessment Patient needs continued PT services  PT Problem List Decreased strength;Decreased activity tolerance;Decreased range of motion;Decreased balance;Pain;Decreased  mobility       PT Treatment Interventions DME instruction;Stair training;Therapeutic activities;Balance training;Therapeutic exercise;Functional mobility training;Gait training;Patient/family education    PT Goals (Current goals can be found in the Care Plan section)  Acute Rehab PT Goals Patient Stated Goal: to go home PT Goal Formulation: With patient Time For Goal Achievement: 03/08/19 Potential to Achieve Goals: Good    Frequency 7X/week   Barriers to discharge        Co-evaluation               AM-PAC PT "6 Clicks" Mobility  Outcome Measure Help needed turning from your back to your side while in a flat bed without using bedrails?: A Little Help needed moving from lying on your back to sitting on the side of a flat bed without using bedrails?: A Little Help needed moving to and from a bed to a chair (including a wheelchair)?: A Little Help needed standing up from a chair using your arms (e.g., wheelchair or bedside chair)?: A Little Help needed to walk in hospital room?: A Little Help needed climbing 3-5 steps with a railing? : A Little 6 Click Score: 18    End of Session   Activity Tolerance: Other (comment)(limited due to R LE weakness/numbness) Patient left: in bed;with call bell/phone within reach;with nursing/sitter in room Nurse Communication: Mobility status PT Visit Diagnosis: Difficulty in walking, not elsewhere classified (R26.2);Pain Pain - Right/Left: Right Pain - part of body: Knee    Time: [id number] PT Time Calculation (min) (ACUTE ONLY): 27 min   Charges:   PT Evaluation $PT Eval Moderate Complexity: 1 Mod PT Treatments $Therapeutic Activity: 8-22 mins        Magda Kiel, [place] Acute [facility] Services [phone] 03/04/2019   Reginia Naas 03/04/2019, 6:17 PM

## 2019-03-04 NOTE — Anesthesia Postprocedure Evaluation (Signed)
Anesthesia Post Note  Patient: TATEANNA FAREWELL  Procedure(s) Performed: RIGHT TOTAL KNEE ARTHROPLASTY (Right Knee)     Patient location during evaluation: PACU Anesthesia Type: Spinal Level of consciousness: awake and alert and oriented Pain management: pain level controlled Vital Signs Assessment: post-procedure vital signs reviewed and stable Respiratory status: spontaneous breathing, nonlabored ventilation, respiratory function stable and patient connected to nasal cannula oxygen Cardiovascular status: stable and blood pressure returned to baseline Postop Assessment: no apparent nausea or vomiting, no headache, no backache, spinal receding and patient able to bend at knees Anesthetic complications: no    Last Vitals:  Vitals:   03/04/19 1250 03/04/19 1305  BP: (!) 103/55 (!) 109/58  Pulse: (!) 58 (!) 56  Resp: (!) 9 12  Temp:  36.4 C  SpO2: 95% 94%    Last Pain:  Vitals:   03/04/19 1305  TempSrc:   PainSc: Asleep                 Gerrard Crystal A.

## 2019-03-04 NOTE — Op Note (Signed)
Pre abductor block plus Exparel and Marcaine 20+20 = 40 op diagnosis: Right knee primary osteoarthritis  Postop diagnosis: Same  Procedure: Right total knee arthroplasty.  Surgeon: Rodell Perna, MD  Assistant: Benjiman Core, PA-C medically necessary and present for the entire procedure  Anesthesia: Spinal plus preoperative abductor block plus Exparel and Marcaine local.  Tourniquet: 350 x 54 minutes.  Implants: Depuy Size 5 femur size 4 tibia 5 mm rotating platform, 4mm 3 peg patella. Attune Depuy  Procedure: After preoperative block spinal anesthesia proximal thigh tourniquet lateral post heel bump DuraPrep from the tip of the toes to the tourniquet usual extremity sheets drapes sterile skin marker Betadine Steri-Drape split sheets and drapes were applied.  Timeout procedure was completed.  Leg was wrapped in Esmarch tourniquet inflated.  Midline incision was made medial parapatellar incision.  Tricompartmental degenerative arthritis with most severe changes on the patella grade 4 changes and some in the medial and lateral compartment with areas of more severe involvement with some areas with more normal cartilage.  ACL PCL meniscus remnants were resected.  Intramedullary hole was drilled in the femur 10 mm was resected off the femur 9 mm on the tibia.  Spacer block 5 mm allowed full extension good collateral balance.  Keel preparation on the tibia after chamfer blocks were made on the femur a box cut.  Trials were inserted 5 mm gave full extension good collateral balance.  Patella was prepared removing 10 mm and drilled trial was placed and set flush.  Pulsatile lavage vacuum mixing of the cement.  Tibia was cemented first followed by femur placement of the permanent 5 mm rotating platform and then patella held with a clamp.  Patella was sitting up slightly superior and lateral it was removed 1 hole was redrilled it still set up on the superior lateral aspect half millimeter but the rest of the  circumference was flat.  Cut looked good and did not appear to be slanted.  Cement was hard and it 15 minutes all excessive cement been removed.  Good balance and after cement was slowly setting up Exparel Marcaine was infiltrated and then tourniquet was deflated once the cement was hard at 15 minutes.  Hemostasis obtained in standard #1 Vicryl dyed suture in the deep retinaculum 2-0 Vicryl superficial retinaculum and subcuticular skin closure.  Instrument count needle count was correct.  Tincture benzoin Steri-Strips postop dressing was applied and patient was transferred recovery room in stable condition.

## 2019-03-04 NOTE — Care Plan (Signed)
Ortho Bundle Case Management Note  Patient Details  Name: Kathy Cook MRN: [id number] Date of Birth: May 21, 1945   [facility] met with patient last week in office for her H&P appointment with Benjiman Core, PA-C. She is scheduled for a Right TKA with Dr. Lorin Mercy on Monday, 03/04/19. Reviewed Ortho bundle through [facility] and provided opportunity for patient to ask questions related to pre-surgery and post-surgery care. She lives with a spouse and will have assistance after discharge from the hospital. She will need a FWW and declines a 3in1/BSC. Anticipate HHPT will be needed after short hospital stay. Choice provided. Referral made to [facility]. OPPT also anticipated to begin around 2 weeks post-op. CM will make referral and schedule with Marga Hoots (Here at this office) to begin around that time. Will continue to provide CM through episode of care.         DME Arranged:  Gilford Rile rolling DME Agency:  [employer]  HH Arranged:  PT [place] Agency:  [facility] (now [facility])  Additional Comments: Please contact me with any questions of if this plan should need to change.  Jamse Arn, RN, BSN, [employer]  [phone] 03/04/2019, 12:23 PM

## 2019-03-04 NOTE — Anesthesia Procedure Notes (Signed)
Spinal  Patient location during procedure: OR Start time: 03/04/2019 9:57 AM End time: 03/04/2019 10:00 AM Staffing Performed: anesthesiologist  Anesthesiologist: Josephine Igo, MD Preanesthetic Checklist Completed: patient identified, IV checked, site marked, risks and benefits discussed, surgical consent, monitors and equipment checked, pre-op evaluation and timeout performed Spinal Block Patient position: sitting Prep: DuraPrep and site prepped and draped Patient monitoring: heart rate, cardiac monitor, continuous pulse ox and blood pressure Approach: midline Location: L4-5 Injection technique: single-shot Needle Needle type: Pencan  Needle gauge: 24 G Needle length: 9 cm Assessment Sensory level: T6 Additional Notes Patient tolerated procedure well. Adequate sensory level.

## 2019-03-04 NOTE — Anesthesia Preprocedure Evaluation (Addendum)
Anesthesia Evaluation  Patient identified by MRN, date of birth, ID band Patient awake    Reviewed: Allergy & Precautions, NPO status , Patient's Chart, lab work & pertinent test results  Airway Mallampati: II  TM Distance: >3 FB Neck ROM: Full    Dental no notable dental hx.    Pulmonary neg pulmonary ROS, former smoker,    Pulmonary exam normal breath sounds clear to auscultation       Cardiovascular hypertension, Normal cardiovascular exam Rhythm:Regular Rate:Normal     Neuro/Psych PSYCHIATRIC DISORDERS Anxiety Depression ADDnegative neurological ROS     GI/Hepatic Neg liver ROS, GERD  Medicated,  Endo/Other  Hyperlipidemia  Renal/GU negative Renal ROS  negative genitourinary   Musculoskeletal  (+) Arthritis , Osteoarthritis,  DJD right knee Lumbar DDD   Abdominal (+) + obese,   Peds  Hematology  (+) anemia ,   Anesthesia Other Findings   Reproductive/Obstetrics HSV                             Anesthesia Physical Anesthesia Plan  ASA: II  Anesthesia Plan: Spinal   Post-op Pain Management:  Regional for Post-op pain   Induction:   PONV Risk Score and Plan: 4 or greater and Ondansetron, Dexamethasone and Treatment may vary due to age or medical condition  Airway Management Planned: Natural Airway, Nasal Cannula and Simple Face Mask  Additional Equipment:   Intra-op Plan:   Post-operative Plan:   Informed Consent: I have reviewed the patients History and Physical, chart, labs and discussed the procedure including the risks, benefits and alternatives for the proposed anesthesia with the patient or authorized representative who has indicated his/her understanding and acceptance.     Dental advisory given  Plan Discussed with: CRNA and Surgeon  Anesthesia Plan Comments:        Anesthesia Quick Evaluation

## 2019-03-04 NOTE — Plan of Care (Signed)
  Problem: Education: Goal: Knowledge of General Education information will improve Description: Including pain rating scale, medication(s)/side effects and non-pharmacologic comfort measures Outcome: Progressing   Problem: Clinical Measurements: Goal: Respiratory complications will improve Outcome: Progressing Note: On room air Goal: Cardiovascular complication will be avoided Outcome: Progressing   Problem: Activity: Goal: Risk for activity intolerance will decrease Outcome: Progressing Note: Up to [facility] once, tolerated fair, with pain   Problem: Nutrition: Goal: Adequate nutrition will be maintained Outcome: Progressing   Problem: Coping: Goal: Level of anxiety will decrease Outcome: Progressing   Problem: Elimination: Goal: Will not experience complications related to urinary retention Outcome: Progressing   Problem: Pain Managment: Goal: General experience of comfort will improve Outcome: Progressing Note: Treated for right knee pain with oxycodone and robaxin   Problem: Safety: Goal: Ability to remain free from injury will improve Outcome: Progressing

## 2019-03-04 NOTE — Anesthesia Procedure Notes (Signed)
Anesthesia Regional Block: Adductor canal block   Pre-Anesthetic Checklist: ,, timeout performed, Correct Patient, Correct Site, Correct Laterality, Correct Procedure, Correct Position, site marked, Risks and benefits discussed,  Surgical consent,  Pre-op evaluation,  At surgeon's request and post-op pain management  Laterality: Right  Prep: chloraprep       Needles:  Injection technique: Single-shot  Needle Type: Echogenic Stimulator Needle     Needle Length: 9cm  Needle Gauge: 21   Needle insertion depth: 8 cm   Additional Needles:   Procedures:,,,, ultrasound used (permanent image in chart),,,,  Narrative:  Start time: 03/04/2019 8:52 AM End time: 03/04/2019 8:57 AM Injection made incrementally with aspirations every 5 mL.  Performed by: Personally  Anesthesiologist: Josephine Igo, MD  Additional Notes: Timeout performed. Patient sedated. Relevant anatomy ID'd using [place]. Incremental 2-73ml injection of LA with frequent aspiration. Patient tolerated procedure well.        Right Adductor Canal Block

## 2019-03-04 NOTE — Interval H&P Note (Signed)
History and Physical Interval Note:  03/04/2019 9:10 AM  Kathy Cook  has presented today for surgery, with the diagnosis of right knee osteoarthritis.  The various methods of treatment have been discussed with the patient and family. After consideration of risks, benefits and other options for treatment, the patient has consented to  Procedure(s): RIGHT TOTAL KNEE ARTHROPLASTY (Right) as a surgical intervention.  The patient's history has been reviewed, patient examined, no change in status, stable for surgery.  I have reviewed the patient's chart and labs.  Questions were answered to the patient's satisfaction.     [name]

## 2019-03-05 DIAGNOSIS — Z96641 Presence of right artificial hip joint: Secondary | ICD-10-CM | POA: Diagnosis present

## 2019-03-05 DIAGNOSIS — M1711 Unilateral primary osteoarthritis, right knee: Secondary | ICD-10-CM | POA: Diagnosis present

## 2019-03-05 DIAGNOSIS — Z79890 Hormone replacement therapy (postmenopausal): Secondary | ICD-10-CM | POA: Diagnosis not present

## 2019-03-05 DIAGNOSIS — Z8249 Family history of ischemic heart disease and other diseases of the circulatory system: Secondary | ICD-10-CM | POA: Diagnosis not present

## 2019-03-05 DIAGNOSIS — Z823 Family history of stroke: Secondary | ICD-10-CM | POA: Diagnosis not present

## 2019-03-05 DIAGNOSIS — E669 Obesity, unspecified: Secondary | ICD-10-CM | POA: Diagnosis present

## 2019-03-05 DIAGNOSIS — Z6834 Body mass index (BMI) 34.0-34.9, adult: Secondary | ICD-10-CM | POA: Diagnosis not present

## 2019-03-05 DIAGNOSIS — Z20822 Contact with and (suspected) exposure to covid-19: Secondary | ICD-10-CM | POA: Diagnosis present

## 2019-03-05 DIAGNOSIS — Z833 Family history of diabetes mellitus: Secondary | ICD-10-CM | POA: Diagnosis not present

## 2019-03-05 DIAGNOSIS — Z87891 Personal history of nicotine dependence: Secondary | ICD-10-CM | POA: Diagnosis not present

## 2019-03-05 LAB — BASIC METABOLIC PANEL
Anion gap: 9 (ref 5–15)
BUN: 9 mg/dL (ref 8–23)
CO2: 26 mmol/L (ref 22–32)
Calcium: 8.4 mg/dL — ABNORMAL LOW (ref 8.9–10.3)
Chloride: 102 mmol/L (ref 98–111)
Creatinine, Ser: 0.82 mg/dL (ref 0.44–1.00)
GFR calc Af Amer: >60 mL/min (ref >60)
GFR calc non Af Amer: >60 mL/min (ref >60)
Glucose, Bld: 143 mg/dL — ABNORMAL HIGH (ref 70–99)
Potassium: 4.1 mmol/L (ref 3.5–5.1)
Sodium: 137 mmol/L (ref 135–145)

## 2019-03-05 LAB — CBC
HCT: 33.3 % — ABNORMAL LOW (ref 36.0–46.0)
Hemoglobin: 10.7 g/dL — ABNORMAL LOW (ref 12.0–15.0)
MCH: 32.8 pg (ref 26.0–34.0)
MCHC: 32.1 g/dL (ref 30.0–36.0)
MCV: 102.1 fL — ABNORMAL HIGH (ref 80.0–100.0)
Platelets: 175 10*3/uL (ref 150–400)
RBC: 3.26 MIL/uL — ABNORMAL LOW (ref 3.87–5.11)
RDW: 12.1 % (ref 11.5–15.5)
WBC: 8.1 10*3/uL (ref 4.0–10.5)
nRBC: 0 % (ref 0.0–0.2)

## 2019-03-05 MED ORDER — METHOCARBAMOL 500 MG PO TABS: 500.0000 mg | ORAL_TABLET | Freq: Four times a day (QID) | ORAL | 0 refills | Status: DC | PRN

## 2019-03-05 MED ORDER — ASPIRIN 325 MG PO TABS: 325.0000 mg | ORAL_TABLET | Freq: Every day | ORAL | Status: DC

## 2019-03-05 MED ORDER — OXYCODONE-ACETAMINOPHEN 5-325 MG PO TABS: 1.0000 | ORAL_TABLET | Freq: Four times a day (QID) | ORAL | 0 refills | Status: DC | PRN

## 2019-03-05 NOTE — Progress Notes (Signed)
   Subjective: 1 Day Post-Op Procedure(s) (LRB): RIGHT TOTAL KNEE ARTHROPLASTY (Right) Patient reports pain as severe.    Objective: Vital signs in last 24 hours: Temp:  [97 F (36.1 C)-99.2 F (37.3 C)] 99.2 F (37.3 C) (01/05 0730) Pulse Rate:  [56-72] 68 (01/05 0730) Resp:  [9-20] 17 (01/05 0730) BP: (103-165)/(51-80) 124/68 (01/05 0730) SpO2:  [93 %-100 %] 96 % (01/05 0730) Weight:  [86.2 kg] 86.2 kg (01/04 0815)  Intake/Output from previous day: 01/04 0701 - 01/05 0700 In: 1042.6 [I.V.:842.6; IV Piggyback:200] Out: 291 [Urine:216; Blood:75] Intake/Output this shift: No intake/output data recorded.  No results for input(s): HGB in the last 72 hours. No results for input(s): WBC, RBC, HCT, PLT in the last 72 hours. No results for input(s): NA, K, CL, CO2, BUN, CREATININE, GLUCOSE, CALCIUM in the last 72 hours. No results for input(s): LABPT, INR in the last 72 hours.  Neurologically intact DG Knee 1-2 Views Right  Result Date: 03/04/2019 CLINICAL DATA:  Post-op right knee replacement.best lateral-''could not get any better' EXAM: RIGHT KNEE - 1-2 VIEW COMPARISON:  None. FINDINGS: Total knee arthroplasty. Prosthetic components appear well seated. No complication. IMPRESSION: RIGHT total knee arthroplasty. Electronically Signed   By: Suzy Bouchard M.D.   On: 03/04/2019 13:00    Assessment/Plan: 1 Day Post-Op Procedure(s) (LRB): RIGHT TOTAL KNEE ARTHROPLASTY (Right) Up with therapy, dressing change. Used CPM. No labs back from AM draw.   [name] 03/05/2019, 7:46 AM

## 2019-03-05 NOTE — Progress Notes (Signed)
Pt not able to walk more than 20 ft. Not able to make it to BR and concern for fall risk with bedside commode. Slow progress due to pain.  Change to admit , hopeful home tomorrow afternoon. Hgb OK . Swelling normal.

## 2019-03-05 NOTE — Progress Notes (Signed)
Physical Therapy Treatment Patient Details Name: Kathy Cook MRN: [id number] DOB: 06-24-1945 Today's Date: 03/05/2019    History of Present Illness Patient is a 74 y/o female admitted for RTKA.  PMH positive for HTN, ADD, GERD, HSV, ocular rosacea.    PT Comments    Pt sleeping in chair on arrival, upon arousal states pain is returning and RN brought Robaxin at beginning of session. Pt unable to ambulate this session and with very limited ROM tolerance but tolerating 0-50 in CPM end of session. Pt educated for alternating pain meds and muscle relaxer and need to avoid dilaudid for pain control as she was barely able to function due to lethargy after meds this am. Pt is in a difficult position to progress therapy due to pain management and will continue to follow. RN aware.  KI utilized throughout transfers    Follow Up Recommendations  Follow surgeon's recommendation for DC plan and follow-up therapies;Home health PT     Equipment Recommendations  Rolling walker with 5" wheels;3in1 (PT)    Recommendations for Other Services       Precautions / Restrictions Precautions Precautions: Fall Required Braces or Orthoses: Knee Immobilizer - Right Knee Immobilizer - Right: Discontinue once straight leg raise with < 10 degree lag Restrictions RLE Weight Bearing: Weight bearing as tolerated    Mobility  Bed Mobility Overal bed mobility: Needs Assistance Bed Mobility: Sit to Supine       Sit to supine: Min assist   General bed mobility comments: assist to bring RLE to surface with increased time and cues for sequence  Transfers Overall transfer level: Needs assistance   Transfers: Sit to/from Stand;Stand Pivot Transfers Sit to Stand: Min guard Stand pivot transfers: Min assist       General transfer comment: cues for hand placement with pt able to rise from recliner with pt unable to significantly bear weight on RLE this session taking very short shuffling steps, declined  gait after 2 steps and pivoted from chair to bed with RW  Ambulation/Gait                 Stairs             Wheelchair Mobility    Modified Rankin (Stroke Patients Only)       Balance Overall balance assessment: Needs assistance Sitting-balance support: No upper extremity supported;Feet supported Sitting balance-Leahy Scale: Good Sitting balance - Comments: EOB without assist   Standing balance support: Bilateral upper extremity supported Standing balance-Leahy Scale: Poor Standing balance comment: UE support for balance                            Cognition Arousal/Alertness: Lethargic Behavior During Therapy: WFL for tasks assessed/performed Overall Cognitive Status: Impaired/Different from baseline Area of Impairment: Following commands                       Following Commands: Follows one step commands with increased time              Exercises Total Joint Exercises Ankle Circles/Pumps: AROM;Right;Supine;10 reps Heel Slides: AAROM;Supine;Right;10 reps(pt very resistant to ROM this session and only achieving grossly 15 degrees) Hip ABduction/ADduction: AAROM;Right;Supine;10 reps Straight Leg Raises: AAROM;Supine;Right;10 reps Goniometric ROM: unable to significantly range knee due to pt resistance. grossly 5-15 degrees    General Comments        Pertinent Vitals/Pain Pain Score: 8  Pain Location: R knee  Pain Descriptors / Indicators: Aching;Constant Pain Intervention(s): Limited activity within patient's tolerance;Monitored during session;RN gave pain meds during session;Repositioned;Ice applied    Home Living                      Prior Function            PT Goals (current goals can now be found in the care plan section) Progress towards PT goals: Progressing toward goals(very slowly limited by pain)    Frequency    7X/week      PT Plan Current plan remains appropriate    Co-evaluation               AM-PAC PT "6 Clicks" Mobility   Outcome Measure  Help needed turning from your back to your side while in a flat bed without using bedrails?: A Little Help needed moving from lying on your back to sitting on the side of a flat bed without using bedrails?: A Little Help needed moving to and from a bed to a chair (including a wheelchair)?: A Little Help needed standing up from a chair using your arms (e.g., wheelchair or bedside chair)?: A Little Help needed to walk in hospital room?: A Little Help needed climbing 3-5 steps with a railing? : A Lot 6 Click Score: 17    End of Session Equipment Utilized During Treatment: Gait belt Activity Tolerance: Patient limited by pain Patient left: in bed;with call bell/phone within reach;with nursing/sitter in room Nurse Communication: Mobility status;Weight bearing status PT Visit Diagnosis: Difficulty in walking, not elsewhere classified (R26.2);Pain;Other abnormalities of gait and mobility (R26.89) Pain - Right/Left: Right Pain - part of body: Knee     Time: 1130-1200 PT Time Calculation (min) (ACUTE ONLY): 30 min  Charges:  $Therapeutic Exercise: 8-22 mins $Therapeutic Activity: 8-22 mins                     Farris Blash P, PT Acute Rehabilitation Services Pager: [phone] Office: [place] 03/05/2019, 1:46 PM

## 2019-03-05 NOTE — Progress Notes (Signed)
PT Cancellation Note  Patient Details Name: Kathy Cook MRN: [id number] DOB: December 27, 1945   Cancelled Treatment:    Reason Eval/Treat Not Completed: Pain limiting ability to participate(pt reports intense pain after oxy and Rn requested to provide additional medication prior to session)   Aneeka Bowden B Huxton Glaus 03/05/2019, 7:29 AM Bayard Males, PT Acute Rehabilitation Services Pager: [phone] Office: [phone]

## 2019-03-05 NOTE — Progress Notes (Signed)
Physical Therapy Treatment Patient Details Name: Kathy Cook MRN: [id number] DOB: 14-Jun-1945 Today's Date: 03/05/2019    History of Present Illness Patient is a 74 y/o female admitted for RTKA.  PMH positive for HTN, ADD, GERD, HSV, ocular rosacea.    PT Comments    Pt premedicated and on arrival lethargic with limited arousal and focused attention. Pt with improved arousal once RLE movement initiated but pt remained slightly lethargic throughout session with nausea end of gait. Pt with limited strength and ROM RLE with improved SLR with repetition but KI maintained for transfer and gait this session due to limited strength and buckling last session. Pt educated for goals and progression with pt limited by pain. Pt in heel foam end of session and encouraged to maintain 20 min. Will continue to follow.    Follow Up Recommendations  Follow surgeon's recommendation for DC plan and follow-up therapies;Home health PT     Equipment Recommendations  Rolling walker with 5" wheels;3in1 (PT)    Recommendations for Other Services       Precautions / Restrictions Precautions Precautions: Fall Required Braces or Orthoses: Knee Immobilizer - Right Knee Immobilizer - Right: Discontinue once straight leg raise with < 10 degree lag Restrictions Weight Bearing Restrictions: Yes RLE Weight Bearing: Weight bearing as tolerated    Mobility  Bed Mobility Overal bed mobility: Needs Assistance Bed Mobility: Supine to Sit     Supine to sit: Min assist     General bed mobility comments: assist of RLE with cues for sequence, increased time and use of rail  Transfers Overall transfer level: Needs assistance   Transfers: Sit to/from Stand Sit to Stand: Min guard Stand pivot transfers: Min assist       General transfer comment: cues for hand placement to rise from bed and BSC, pivot bed to chair with RW with min assist  Ambulation/Gait Ambulation/Gait assistance: Min guard Gait Distance  (Feet): 20 Feet Assistive device: Rolling walker (2 wheeled) Gait Pattern/deviations: Step-to pattern;Decreased stance time - right   Gait velocity interpretation: <1.8 ft/sec, indicate of risk for recurrent falls General Gait Details: pt with slow cautious gait with cues for sequence and safety with KI on for stability for gait   Stairs             Wheelchair Mobility    Modified Rankin (Stroke Patients Only)       Balance Overall balance assessment: Needs assistance   Sitting balance-Leahy Scale: Good     Standing balance support: Bilateral upper extremity supported Standing balance-Leahy Scale: Poor Standing balance comment: UE support for balance                            Cognition Arousal/Alertness: Lethargic Behavior During Therapy: WFL for tasks assessed/performed Overall Cognitive Status: Within Functional Limits for tasks assessed                                        Exercises Total Joint Exercises Quad Sets: AROM;Right;Supine;10 reps Heel Slides: AAROM;Supine;Right;10 reps Hip ABduction/ADduction: AAROM;Right;Supine;10 reps Straight Leg Raises: AAROM;Supine;Right;10 reps    General Comments        Pertinent Vitals/Pain Pain Score: 8  Pain Location: R knee Pain Descriptors / Indicators: Aching;Constant Pain Intervention(s): Limited activity within patient's tolerance;Monitored during session;Premedicated before session;Repositioned    Home Living  Prior Function            PT Goals (current goals can now be found in the care plan section) Progress towards PT goals: Progressing toward goals    Frequency    7X/week      PT Plan Current plan remains appropriate    Co-evaluation              AM-PAC PT "6 Clicks" Mobility   Outcome Measure  Help needed turning from your back to your side while in a flat bed without using bedrails?: A Little Help needed moving from  lying on your back to sitting on the side of a flat bed without using bedrails?: A Little Help needed moving to and from a bed to a chair (including a wheelchair)?: A Little Help needed standing up from a chair using your arms (e.g., wheelchair or bedside chair)?: A Little Help needed to walk in hospital room?: A Little Help needed climbing 3-5 steps with a railing? : A Lot 6 Click Score: 17    End of Session Equipment Utilized During Treatment: Gait belt Activity Tolerance: Patient limited by pain Patient left: in chair;with call bell/phone within reach;with chair alarm set Nurse Communication: Mobility status PT Visit Diagnosis: Difficulty in walking, not elsewhere classified (R26.2);Pain;Other abnormalities of gait and mobility (R26.89)     Time: [id number] PT Time Calculation (min) (ACUTE ONLY): 34 min  Charges:  $Gait Training: 8-22 mins $Therapeutic Exercise: 8-22 mins                     Clary Meeker P, PT Acute Rehabilitation Services Pager: [phone] Office: [place] 03/05/2019, 9:45 AM

## 2019-03-05 NOTE — Care Management Obs Status (Signed)
[place] NOTIFICATION   Patient Details  Name: Kathy Cook MRN: [id number] Date of Birth: 25-May-1945   Medicare Observation Status Notification Given:  Yes    Atilano Median, [place] 03/05/2019, 2:14 PM

## 2019-03-06 LAB — CBC
HCT: 31.4 % — ABNORMAL LOW (ref 36.0–46.0)
Hemoglobin: 9.8 g/dL — ABNORMAL LOW (ref 12.0–15.0)
MCH: 32.3 pg (ref 26.0–34.0)
MCHC: 31.2 g/dL (ref 30.0–36.0)
MCV: 103.6 fL — ABNORMAL HIGH (ref 80.0–100.0)
Platelets: 145 10*3/uL — ABNORMAL LOW (ref 150–400)
RBC: 3.03 MIL/uL — ABNORMAL LOW (ref 3.87–5.11)
RDW: 12.2 % (ref 11.5–15.5)
WBC: 7.5 10*3/uL (ref 4.0–10.5)
nRBC: 0 % (ref 0.0–0.2)

## 2019-03-06 MED ORDER — BISACODYL 10 MG RE SUPP
10.0000 mg | Freq: Once | RECTAL | Status: AC
  Administered 2019-03-06: 10 mg via RECTAL
  Filled 2019-03-06: qty 1

## 2019-03-06 NOTE — Plan of Care (Signed)

## 2019-03-06 NOTE — Progress Notes (Signed)
Physical Therapy Progress Note  Clinical Impression: Pt seen for a third session today at RN's request as pt is d/c'ing home today with husband's support despite very minimal progress with mobility. Pt continues to require min A for bed mobility, mod A for transfers and she was only able tolerate ambulating 2' with RW and min A this session. Pt very upset regarding plan for d/c home today and does not want to go home. Pt would benefit from further acute rehab services prior to d/c'ing home with husband.  Pt would continue to benefit from skilled physical therapy services at this time while admitted and after d/c to address the below listed limitations in order to improve overall safety and independence with functional mobility.    03/06/19 1700  PT Visit Information  Last PT Received On 03/06/19  Assistance Needed +1  History of Present Illness Patient is a 74 y/o female s/p elective R TKA.  PMH positive for HTN, ADD, GERD, HSV, ocular rosacea.  Precautions  Precautions Fall;Knee  Restrictions  Weight Bearing Restrictions Yes  RLE Weight Bearing WBAT  Pain Assessment  Pain Assessment Faces  Faces Pain Scale 8  Pain Location R knee  Pain Descriptors / Indicators Aching;Constant  Pain Intervention(s) Monitored during session;Repositioned  Cognition  Arousal/Alertness Awake/alert  Behavior During Therapy Anxious  Overall Cognitive Status Impaired/Different from baseline  Area of Impairment Following commands  Following Commands Follows one step commands with increased time  Bed Mobility  Overal bed mobility Needs Assistance  Bed Mobility Supine to Sit  Supine to sit Min assist  General bed mobility comments increased time and effort needed, use of bed rails, HOB slightly elevated, min A for movement of R LE off of bed and for trunk elevation  Transfers  Overall transfer level Needs assistance  Equipment used Rolling walker (2 wheeled)  Transfers Sit to/from Stand  Sit to Stand Mod  assist  General transfer comment cueing again needed for safe hand placement and technique with RW, mod A needed to power into standing from EOB  Ambulation/Gait  Ambulation/Gait assistance Min assist  Gait Distance (Feet) 2 Feet  Assistive device Rolling walker (2 wheeled)  Gait Pattern/deviations Step-to pattern;Decreased step length - left;Decreased stance time - right;Decreased stride length;Decreased weight shift to right;Antalgic  General Gait Details pt with very slow, antalgic gait and significantly limited secondary to pain  Gait velocity decreased  Balance  Overall balance assessment Needs assistance  Sitting-balance support No upper extremity supported;Feet supported  Sitting balance-Leahy Scale Fair  Standing balance support Bilateral upper extremity supported  Standing balance-Leahy Scale Poor  PT - End of Session  Equipment Utilized During Treatment Gait belt  Activity Tolerance Patient limited by pain  Patient left in chair;with call bell/phone within reach  Nurse Communication Mobility status;Weight bearing status  CPM Right Knee  CPM Right Knee Off   PT - Assessment/Plan  PT Plan Current plan remains appropriate  PT Visit Diagnosis Difficulty in walking, not elsewhere classified (R26.2);Pain;Other abnormalities of gait and mobility (R26.89)  Pain - Right/Left Right  Pain - part of body Knee  PT Frequency (ACUTE ONLY) 7X/week  Follow Up Recommendations Home health PT;Supervision/Assistance - 24 hour  PT equipment Rolling walker with 5" wheels;3in1 (PT)  AM-PAC PT "6 Clicks" Mobility Outcome Measure (Version 2)  Help needed turning from your back to your side while in a flat bed without using bedrails? 3  Help needed moving from lying on your back to sitting on the side of  a flat bed without using bedrails? 3  Help needed moving to and from a bed to a chair (including a wheelchair)? 2  Help needed standing up from a chair using your arms (e.g., wheelchair or bedside  chair)? 2  Help needed to walk in hospital room? 2  Help needed climbing 3-5 steps with a railing?  2  6 Click Score 14  Consider Recommendation of Discharge To: CIR/SNF/LTACH  PT Goal Progression  Progress towards PT goals Progressing toward goals  Acute Rehab PT Goals  PT Goal Formulation With patient  Time For Goal Achievement 03/08/19  Potential to Achieve Goals Good  PT Time Calculation  PT Start Time (ACUTE ONLY) 1700  PT Stop Time (ACUTE ONLY) 1718  PT Time Calculation (min) (ACUTE ONLY) 18 min  PT General Charges  $$ ACUTE PT VISIT 1 Visit  PT Treatments  $Gait Training 8-22 mins  Anastasio Champion, DPT  Acute Rehabilitation Services Pager [phone] Office [phone]

## 2019-03-06 NOTE — Progress Notes (Signed)
BM times 2 with suppository.  OOB to bedside commode for voiding.  Home today. Husband will pick her up . Office one week.  They have my cell phone if any problems.

## 2019-03-06 NOTE — Discharge Instructions (Signed)

## 2019-03-06 NOTE — Progress Notes (Signed)
Physical Therapy Treatment Patient Details Name: Kathy Cook MRN: [id number] DOB: 08-Dec-1945 Today's Date: 03/06/2019    History of Present Illness Patient is a 74 y/o female s/p elective R TKA.  PMH positive for HTN, ADD, GERD, HSV, ocular rosacea.    PT Comments    Pt making very slow progress with functional mobility and remains greatly limited secondary to pain. She was only able to tolerate transfers this session with min A and frequent cueing for safety with use of RW. Per pt, plan is to d/c home today; however, she does not feel prepared. Feel that pt would greatly benefit from further acute therapy services prior to d/c'ing home with husband's support.  Pt would continue to benefit from skilled physical therapy services at this time while admitted and after d/c to address the below listed limitations in order to improve overall safety and independence with functional mobility.    Follow Up Recommendations  Home health PT;Supervision/Assistance - 24 hour     Equipment Recommendations  Rolling walker with 5" wheels;3in1 (PT)    Recommendations for Other Services       Precautions / Restrictions Precautions Precautions: Fall;Knee Required Braces or Orthoses: Knee Immobilizer - Right Knee Immobilizer - Right: Discontinue once straight leg raise with < 10 degree lag Restrictions Weight Bearing Restrictions: Yes RLE Weight Bearing: Weight bearing as tolerated    Mobility  Bed Mobility Overal bed mobility: Needs Assistance Bed Mobility: Supine to Sit     Supine to sit: Min assist     General bed mobility comments: min A for movement of R LE off of bed; pt very slow with all movement  Transfers Overall transfer level: Needs assistance Equipment used: Rolling walker (2 wheeled) Transfers: Sit to/from [facility] Sit to Stand: Min assist Stand pivot transfers: Min assist       General transfer comment: increased time and effort, cueing for safe  hand placement and technique with RW. Assistance needed for stability and frequent cueing needed for sequencing to perform task  Ambulation/Gait             General Gait Details: unable   Stairs             Wheelchair Mobility    Modified Rankin (Stroke Patients Only)       Balance Overall balance assessment: Needs assistance Sitting-balance support: No upper extremity supported;Feet supported Sitting balance-Leahy Scale: Fair     Standing balance support: Bilateral upper extremity supported Standing balance-Leahy Scale: Poor                              Cognition Arousal/Alertness: Awake/alert Behavior During Therapy: Anxious Overall Cognitive Status: Impaired/Different from baseline Area of Impairment: Following commands                       Following Commands: Follows one step commands with increased time              Exercises      General Comments        Pertinent Vitals/Pain Pain Assessment: Faces Faces Pain Scale: Hurts whole lot Pain Location: R knee Pain Descriptors / Indicators: Aching;Constant Pain Intervention(s): Monitored during session;Repositioned    Home Living                      Prior Function  PT Goals (current goals can now be found in the care plan section) Acute Rehab PT Goals PT Goal Formulation: With patient Time For Goal Achievement: 03/08/19 Potential to Achieve Goals: Good Progress towards PT goals: Progressing toward goals    Frequency    7X/week      PT Plan Current plan remains appropriate    Co-evaluation              AM-PAC PT "6 Clicks" Mobility   Outcome Measure  Help needed turning from your back to your side while in a flat bed without using bedrails?: A Little Help needed moving from lying on your back to sitting on the side of a flat bed without using bedrails?: A Little Help needed moving to and from a bed to a chair (including a  wheelchair)?: A Little Help needed standing up from a chair using your arms (e.g., wheelchair or bedside chair)?: A Little Help needed to walk in hospital room?: A Lot Help needed climbing 3-5 steps with a railing? : Total 6 Click Score: 15    End of Session Equipment Utilized During Treatment: Gait belt Activity Tolerance: Patient limited by pain Patient left: with call bell/phone within reach;Other (comment)(sitting on BSC to attempt BM) Nurse Communication: Mobility status;Weight bearing status PT Visit Diagnosis: Difficulty in walking, not elsewhere classified (R26.2);Pain;Other abnormalities of gait and mobility (R26.89) Pain - Right/Left: Right Pain - part of body: Knee     Time: [id number] PT Time Calculation (min) (ACUTE ONLY): 18 min  Charges:  $Therapeutic Activity: 8-22 mins                     Anastasio Champion, DPT  Acute Rehabilitation Services Pager [phone] Office [place] 03/06/2019, 5:27 PM

## 2019-03-06 NOTE — Progress Notes (Signed)
Physical Therapy Treatment Patient Details Name: Kathy Cook MRN: [id number] DOB: [birth year] Today's Date: 03/06/2019    History of Present Illness Patient is a 74 y/o female s/p elective R TKA.  PMH positive for HTN, ADD, GERD, HSV, ocular rosacea.    PT Comments    Pt incredibly limited this session secondary to pain. Pt unable to tolerate any OOB activity this session. Pt's RN was present at beginning, administering a suppository and pain meds. However, pt very limited with all mobility. Will return for a second session today with hopes of gait training. Pt would continue to benefit from skilled physical therapy services at this time while admitted and after d/c to address the below listed limitations in order to improve overall safety and independence with functional mobility.   Follow Up Recommendations  Home health PT;Supervision/Assistance - 24 hour     Equipment Recommendations  Rolling walker with 5" wheels;3in1 (PT)    Recommendations for Other Services       Precautions / Restrictions Precautions Precautions: Fall;Knee Required Braces or Orthoses: Knee Immobilizer - Right Knee Immobilizer - Right: Discontinue once straight leg raise with < 10 degree lag Restrictions Weight Bearing Restrictions: Yes RLE Weight Bearing: Weight bearing as tolerated    Mobility  Bed Mobility Overal bed mobility: Needs Assistance Bed Mobility: Supine to Sit;Sit to Supine     Supine to sit: Min assist Sit to supine: Min assist   General bed mobility comments: min A for movement of R LE off of and onto bed. Pt only able to achieve partial sitting at EOB, initially attempting towards pt's R side but pt quickly lying back down in pain. Then attempting to sit upright towards L side which she was able to sit upright; however, would not allow her R LE to dangle off of bed  Transfers                 General transfer comment: pt refusing at this time and unable to  tolerate  Ambulation/Gait                 Stairs             Wheelchair Mobility    Modified Rankin (Stroke Patients Only)       Balance                                            Cognition Arousal/Alertness: Awake/alert Behavior During Therapy: Anxious Overall Cognitive Status: Impaired/Different from baseline Area of Impairment: Following commands                       Following Commands: Follows one step commands with increased time              Exercises Total Joint Exercises Quad Sets: AAROM;Right;10 reps;Supine Heel Slides: AAROM;Right;10 reps;Supine Hip ABduction/ADduction: AAROM;Right;10 reps;Supine    General Comments        Pertinent Vitals/Pain Pain Assessment: Faces Faces Pain Scale: Hurts worst Pain Location: R knee Pain Descriptors / Indicators: Aching;Constant Pain Intervention(s): Monitored during session;Repositioned;RN gave pain meds during session    Home Living                      Prior Function            PT Goals (current goals can now be  found in the care plan section) Acute Rehab PT Goals PT Goal Formulation: With patient Time For Goal Achievement: 03/08/19 Potential to Achieve Goals: Good Progress towards PT goals: Not progressing toward goals - comment(limited secondary to pain)    Frequency    7X/week      PT Plan Current plan remains appropriate    Co-evaluation              AM-PAC PT "6 Clicks" Mobility   Outcome Measure  Help needed turning from your back to your side while in a flat bed without using bedrails?: A Little Help needed moving from lying on your back to sitting on the side of a flat bed without using bedrails?: A Little Help needed moving to and from a bed to a chair (including a wheelchair)?: A Little Help needed standing up from a chair using your arms (e.g., wheelchair or bedside chair)?: A Little Help needed to walk in hospital room?:  A Little Help needed climbing 3-5 steps with a railing? : A Lot 6 Click Score: 17    End of Session   Activity Tolerance: Patient limited by pain Patient left: in bed;with call bell/phone within reach Nurse Communication: Mobility status;Weight bearing status PT Visit Diagnosis: Difficulty in walking, not elsewhere classified (R26.2);Pain;Other abnormalities of gait and mobility (R26.89) Pain - Right/Left: Right Pain - part of body: Knee     Time: [id number] PT Time Calculation (min) (ACUTE ONLY): 19 min  Charges:  $Therapeutic Activity: 8-22 mins                     Anastasio Champion, DPT  Acute Rehabilitation Services Pager [phone] Office [place] 03/06/2019, 10:57 AM

## 2019-03-06 NOTE — Plan of Care (Signed)

## 2019-03-06 NOTE — Progress Notes (Signed)
Provided discharge education instructions, all questions and concerns addressed, Pt not in distress, discharged home with BSC, RW and belongings. 

## 2019-03-06 NOTE — Care Plan (Signed)
[facility] RNCM met with patient in her hospital room s/p R-TKA per Dr. Lorin Mercy on 03/04/19. Discussed that MD feels patient is ready to go home today. All medications have been called into her pharmacy per MD for pick up by her husband. Discussed with bedside RN. Also met with patient while therapy back in this evening. Patient up with therapy with RW and transferred to chair. Patient received call while CM was in room and discussed with Dr. Lorin Mercy discharge home. Reviewed that CM will be in touch after discharge and reminded to contact office for any questions or needs. Updated floor staff regarding MD order for discharge today. All home DME (FWW and 3in1) delivered to room while CM present.

## 2019-03-06 NOTE — Progress Notes (Signed)
   Subjective: 2 Days Post-Op Procedure(s) (LRB): RIGHT TOTAL KNEE ARTHROPLASTY (Right) Patient reports pain as moderate and severe.    Objective: Vital signs in last 24 hours: Temp:  [98.3 F (36.8 C)-99.7 F (37.6 C)] 99.7 F (37.6 C) (01/06 0338) Pulse Rate:  [67-74] 74 (01/06 0338) Resp:  [16-18] 16 (01/06 0338) BP: (127-141)/(58-68) 127/58 (01/06 0338) SpO2:  [91 %-98 %] 93 % (01/06 0338)  Intake/Output from previous day: No intake/output data recorded. Intake/Output this shift: No intake/output data recorded.  Recent Labs    03/05/19 0723 03/06/19 0413  HGB 10.7* 9.8*   Recent Labs    03/05/19 0723 03/06/19 0413  WBC 8.1 7.5  RBC 3.26* 3.03*  HCT 33.3* 31.4*  PLT 175 145*   Recent Labs    03/05/19 0723  NA 137  K 4.1  CL 102  CO2 26  BUN 9  CREATININE 0.82  GLUCOSE 143*  CALCIUM 8.4*   No results for input(s): LABPT, INR in the last 72 hours.  Neurologically intact, normal amount of post op knee effusion. Dressing dry No results found.  Assessment/Plan: 2 Days Post-Op Procedure(s) (LRB): RIGHT TOTAL KNEE ARTHROPLASTY (Right) Up with therapy, saline lock IV , work on OOB to BR, dulcolax supp this AM.  Discharge likely this afternoon.   Kathy Cook 03/06/2019, 7:57 AM

## 2019-03-07 ENCOUNTER — Telehealth: Payer: Self-pay | Admitting: *Deleted

## 2019-03-07 ENCOUNTER — Encounter: Payer: Self-pay | Admitting: *Deleted

## 2019-03-07 DIAGNOSIS — E785 Hyperlipidemia, unspecified: Secondary | ICD-10-CM | POA: Diagnosis not present

## 2019-03-07 DIAGNOSIS — Z471 Aftercare following joint replacement surgery: Secondary | ICD-10-CM | POA: Diagnosis not present

## 2019-03-07 DIAGNOSIS — M5136 Other intervertebral disc degeneration, lumbar region: Secondary | ICD-10-CM | POA: Diagnosis not present

## 2019-03-07 DIAGNOSIS — K219 Gastro-esophageal reflux disease without esophagitis: Secondary | ICD-10-CM | POA: Diagnosis not present

## 2019-03-07 DIAGNOSIS — L718 Other rosacea: Secondary | ICD-10-CM | POA: Diagnosis not present

## 2019-03-07 DIAGNOSIS — E559 Vitamin D deficiency, unspecified: Secondary | ICD-10-CM | POA: Diagnosis not present

## 2019-03-07 DIAGNOSIS — M8580 Other specified disorders of bone density and structure, unspecified site: Secondary | ICD-10-CM | POA: Diagnosis not present

## 2019-03-07 DIAGNOSIS — Z859 Personal history of malignant neoplasm, unspecified: Secondary | ICD-10-CM | POA: Diagnosis not present

## 2019-03-07 DIAGNOSIS — Z96651 Presence of right artificial knee joint: Secondary | ICD-10-CM | POA: Diagnosis not present

## 2019-03-07 DIAGNOSIS — Z9181 History of falling: Secondary | ICD-10-CM | POA: Diagnosis not present

## 2019-03-07 DIAGNOSIS — M84374D Stress fracture, right foot, subsequent encounter for fracture with routine healing: Secondary | ICD-10-CM | POA: Diagnosis not present

## 2019-03-07 DIAGNOSIS — Z96641 Presence of right artificial hip joint: Secondary | ICD-10-CM | POA: Diagnosis not present

## 2019-03-07 NOTE — Telephone Encounter (Signed)
[facility] D/C call completed. 

## 2019-03-07 NOTE — Care Plan (Signed)
RNCM D/C call to patient to check status. No answer on her cell number, so tried husband. Spoke with patient's husband, who states that his wife seems to be doing ok and had a comfortable night last night after discharge yesterday evening. She did get up and get to the restroom on her own during the night and seems to be doing well so far. Dr. Lorin Mercy has also called to check on them this morning. Reminded of how to contact office and CM for further needs. Will continue to monitor for CM needs.

## 2019-03-09 DIAGNOSIS — E785 Hyperlipidemia, unspecified: Secondary | ICD-10-CM | POA: Diagnosis not present

## 2019-03-09 DIAGNOSIS — L718 Other rosacea: Secondary | ICD-10-CM | POA: Diagnosis not present

## 2019-03-09 DIAGNOSIS — Z96651 Presence of right artificial knee joint: Secondary | ICD-10-CM | POA: Diagnosis not present

## 2019-03-09 DIAGNOSIS — K219 Gastro-esophageal reflux disease without esophagitis: Secondary | ICD-10-CM | POA: Diagnosis not present

## 2019-03-09 DIAGNOSIS — M8580 Other specified disorders of bone density and structure, unspecified site: Secondary | ICD-10-CM | POA: Diagnosis not present

## 2019-03-09 DIAGNOSIS — Z859 Personal history of malignant neoplasm, unspecified: Secondary | ICD-10-CM | POA: Diagnosis not present

## 2019-03-09 DIAGNOSIS — E559 Vitamin D deficiency, unspecified: Secondary | ICD-10-CM | POA: Diagnosis not present

## 2019-03-09 DIAGNOSIS — M5136 Other intervertebral disc degeneration, lumbar region: Secondary | ICD-10-CM | POA: Diagnosis not present

## 2019-03-09 DIAGNOSIS — Z96641 Presence of right artificial hip joint: Secondary | ICD-10-CM | POA: Diagnosis not present

## 2019-03-09 DIAGNOSIS — Z9181 History of falling: Secondary | ICD-10-CM | POA: Diagnosis not present

## 2019-03-09 DIAGNOSIS — Z471 Aftercare following joint replacement surgery: Secondary | ICD-10-CM | POA: Diagnosis not present

## 2019-03-09 DIAGNOSIS — M84374D Stress fracture, right foot, subsequent encounter for fracture with routine healing: Secondary | ICD-10-CM | POA: Diagnosis not present

## 2019-03-11 ENCOUNTER — Telehealth: Payer: Self-pay | Admitting: *Deleted

## 2019-03-11 NOTE — Telephone Encounter (Signed)
7 day Ortho bundle call completed. 

## 2019-03-11 NOTE — Care Plan (Signed)
RNCM call to patient to check status 7 days post-op. Patient states she feels she is doing much better overall. She feels that therapy is really helping and they came over the weekend, but not today. Anticipate she will see them tomorrow. No concerns at this time. Will continue to follow up as needed for CM needs.

## 2019-03-11 NOTE — Discharge Summary (Signed)
Patient ID: Kathy Cook MRN: [id number] DOB/AGE: 06/07/1945 74 y.o.  Admit date: 03/04/2019 Discharge date: 03/11/2019  Admission Diagnoses:  Active Problems:   Unilateral primary osteoarthritis, right knee   Arthritis of right knee   Status post total hip replacement, right   Discharge Diagnoses:  Active Problems:   Unilateral primary osteoarthritis, right knee   Arthritis of right knee   Status post total hip replacement, right  status post Procedure(s): RIGHT TOTAL KNEE ARTHROPLASTY  Past Medical History:  Diagnosis Date  . Allergy   . Anxiety   . Attention deficit disorder (ADD)   . Cancer [facility])    ? basal/squam cell on chest, and forehead  . Depression   . Dry eyes   . GERD (gastroesophageal reflux disease)   . HSV infection   . Lumbar degenerative disc disease   . Ocular rosacea   . Osteopenia     Surgeries: Procedure(s): RIGHT TOTAL KNEE ARTHROPLASTY on 03/04/2019   Consultants:   Discharged Condition: Improved  Hospital Course: Kathy Cook is an 74 y.o. female who was admitted 03/04/2019 for operative treatment of right knee arthritis. Patient failed conservative treatments (please see the history and physical for the specifics) and had severe unremitting pain that affects sleep, daily activities and work/hobbies. After pre-op clearance, the patient was taken to the operating room on 03/04/2019 and underwent  Procedure(s): RIGHT TOTAL KNEE ARTHROPLASTY.    Patient was given perioperative antibiotics:  Anti-infectives (From admission, onward)   Start     Dose/Rate Route Frequency Ordered Stop   03/04/19 0800  ceFAZolin (ANCEF) IVPB 2g/100 mL premix     2 g 200 mL/hr over 30 Minutes Intravenous On call to O.R. 03/04/19 0756 03/04/19 1008       Patient was given sequential compression devices and early ambulation to prevent DVT.   Patient benefited maximally from hospital stay and there were no complications. At the time of discharge, the patient was  urinating/moving their bowels without difficulty, tolerating a regular diet, pain is controlled with oral pain medications and they have been cleared by PT/OT.   Recent vital signs: No data found.   Recent laboratory studies: No results for input(s): WBC, HGB, HCT, PLT, NA, K, CL, CO2, BUN, CREATININE, GLUCOSE, INR, CALCIUM in the last 72 hours.  Invalid input(s): PT, 2   Discharge Medications:   Allergies as of 03/06/2019      Reactions   Prednisone Nausea And Vomiting   Statins    paralyzing   Strattera [atomoxetine Hcl]    Increased sadness      Medication List    TAKE these medications   amphetamine-dextroamphetamine 30 MG tablet Commonly known as: ADDERALL Take 1/2 to 1 tablet 2 x /day as needed for Focus & Concentration What changed:   how much to take  how to take this  when to take this Notes to patient: Resume home regimen   ascorbic acid 500 MG tablet Commonly known as: VITAMIN C Take 500 mg by mouth daily. Notes to patient: Resume home regimen   aspirin 325 MG tablet Commonly known as: Bayer Aspirin Take 1 tablet (325 mg total) by mouth daily. Take one aspirin daily for 4 wks then stop   Biest/Progesterone Crea Place onto the skin. Marland Kitchen05 mg/ml cream to apply daily Notes to patient: Resume home regimen   Colesevelam HCl 3.75 g Pack LET 1 PACKET SIT IN WATER OR ORANGE JUICE THEN MIX AGAIN BEFORE DRINKING ONCE DAILY Notes to patient:  Resume home regimen   famciclovir 500 MG tablet Commonly known as: FAMVIR Take 1 tablet (500 mg total) by mouth 2 (two) times daily. What changed:   when to take this  reasons to take this Notes to patient: Resume home regimen   methocarbamol 500 MG tablet Commonly known as: ROBAXIN Take 1 tablet (500 mg total) by mouth every 6 (six) hours as needed for muscle spasms.   oxyCODONE-acetaminophen 5-325 MG tablet Commonly known as: Percocet Take 1-2 tablets by mouth every 6 (six) hours as needed for severe pain.    Progesterone Micronized 10 % Crea Place onto the skin. Patient applies 2 % cream daily Notes to patient: Resume home regimen   Vitamin D3 125 MCG (5000 UT) Tabs Take 10,000 Units by mouth. Notes to patient: Resume home regimen       Diagnostic Studies: DG Chest 2 View  Result Date: 02/21/2019 CLINICAL DATA:  Preop testing EXAM: CHEST - 2 VIEW COMPARISON:  12/28/2011 FINDINGS: Heart and mediastinal contours are within normal limits. No focal opacities or effusions. No acute bony abnormality. IMPRESSION: No active cardiopulmonary disease. Electronically Signed   By: Rolm Baptise M.D.   On: 02/21/2019 08:09   DG Knee 1-2 Views Right  Result Date: 03/04/2019 CLINICAL DATA:  Post-op right knee replacement.best lateral-''could not get any better' EXAM: RIGHT KNEE - 1-2 VIEW COMPARISON:  None. FINDINGS: Total knee arthroplasty. Prosthetic components appear well seated. No complication. IMPRESSION: RIGHT total knee arthroplasty. Electronically Signed   By: Suzy Bouchard M.D.   On: 03/04/2019 13:00      Follow-up Information    [name], MD. Go on 03/19/2019.   Specialty: Orthopedic Surgery Why: at 1:15 pm for a initial post-op appointment. Contact information: [place] [address] [phone]        [facility], [facility] Follow up.   Specialty: Home Health Services Why: You have been authorized for 5 in home physical therapy visits. Someone from the agency will be in contact with you after discharge from the hospital to arrange a time/date for your first in home visit. Contact information: [address] [place] [address] [place]. Go on 03/20/2019.   Why: at 10:15 am for your first Outpatient Physical Therapy appointment. [facility] [address] [place], [place] [address] [phone]          Discharge Plan:  discharge to home  Disposition:     Signed: Benjiman Core 03/11/2019, 3:28 PM

## 2019-03-12 ENCOUNTER — Telehealth: Payer: Self-pay | Admitting: *Deleted

## 2019-03-12 ENCOUNTER — Other Ambulatory Visit: Payer: Self-pay | Admitting: Orthopaedic Surgery

## 2019-03-12 MED ORDER — OXYCODONE-ACETAMINOPHEN 5-325 MG PO TABS: 1.0000 | ORAL_TABLET | Freq: Four times a day (QID) | ORAL | 0 refills | Status: DC | PRN

## 2019-03-12 NOTE — Telephone Encounter (Signed)
Called patient on 03/12/2019 , 3:36 PM in an attempt to reach the patient for a hospital follow up.   Admit date: 03/04/19 Discharge: 03/06/19   She does not have any questions or concerns about medications from the hospital admission. The patient's medications were reviewed over the phone, they were counseled to bring in all current medications to the hospital follow up visit.   I advised the patient to call if any questions or concerns arise about the hospital admission or medications    Home health was started in the hospital. [facility] is visiting the patient's home for 5 visits and then she will go out patient rehabilitation. All questions were answered and a follow up appointment was made. Appointment made with Vicie Mutters, PA, on 03/18/2019.  Prior to Admission medications   Medication Sig Start Date End Date Taking? Authorizing Provider  amphetamine-dextroamphetamine (ADDERALL) 30 MG tablet Take 1/2 to 1 tablet 2 x /day as needed for Focus & Concentration Patient taking differently: Take 15-30 mg by mouth See admin instructions. Take 1/2 to 1 tablet 2 x /day as needed for Focus & Concentration 01/11/19   Unk Pinto, MD  ascorbic acid (VITAMIN C) 500 MG tablet Take 500 mg by mouth daily.    [provider]  aspirin (BAYER ASPIRIN) 325 MG tablet Take 1 tablet (325 mg total) by mouth daily. Take one aspirin daily for 4 wks then stop 03/05/19   [name], MD  Cholecalciferol (VITAMIN D3) 5000 UNITS TABS Take 10,000 Units by mouth.    [provider]  Colesevelam HCl 3.75 g PACK LET 1 PACKET SIT IN WATER OR ORANGE JUICE THEN MIX AGAIN BEFORE DRINKING ONCE DAILY 05/01/18   Vicie Mutters, PA-C  Estradiol-Estriol-Progesterone (BIEST/PROGESTERONE) CREA Place onto the skin. Marland Kitchen05 mg/ml cream to apply daily    [provider]  famciclovir (FAMVIR) 500 MG tablet Take 1 tablet (500 mg total) by mouth 2 (two) times daily. Patient taking differently: Take 500 mg by  mouth 2 (two) times daily as needed (fever blisters).  04/30/18   Vicie Mutters, PA-C  methocarbamol (ROBAXIN) 500 MG tablet Take 1 tablet (500 mg total) by mouth every 6 (six) hours as needed for muscle spasms. 03/05/19   [name], MD  oxyCODONE-acetaminophen (PERCOCET) 5-325 MG tablet Take 1-2 tablets by mouth every 6 (six) hours as needed for severe pain. 03/12/19 03/11/20  [name], MD  Progesterone Micronized 10 % CREA Place onto the skin. Patient applies 2 % cream daily    [provider]

## 2019-03-12 NOTE — Telephone Encounter (Signed)
Mr. Raiola just called and states his wife will be needing a refill of pain medication. He states she is really doing a good job of weaning slightly. Thanks.

## 2019-03-13 DIAGNOSIS — M8580 Other specified disorders of bone density and structure, unspecified site: Secondary | ICD-10-CM | POA: Diagnosis not present

## 2019-03-13 DIAGNOSIS — Z471 Aftercare following joint replacement surgery: Secondary | ICD-10-CM | POA: Diagnosis not present

## 2019-03-13 DIAGNOSIS — Z96651 Presence of right artificial knee joint: Secondary | ICD-10-CM | POA: Diagnosis not present

## 2019-03-13 DIAGNOSIS — Z859 Personal history of malignant neoplasm, unspecified: Secondary | ICD-10-CM | POA: Diagnosis not present

## 2019-03-13 DIAGNOSIS — Z96641 Presence of right artificial hip joint: Secondary | ICD-10-CM | POA: Diagnosis not present

## 2019-03-13 DIAGNOSIS — K219 Gastro-esophageal reflux disease without esophagitis: Secondary | ICD-10-CM | POA: Diagnosis not present

## 2019-03-13 DIAGNOSIS — M5136 Other intervertebral disc degeneration, lumbar region: Secondary | ICD-10-CM | POA: Diagnosis not present

## 2019-03-13 DIAGNOSIS — M84374D Stress fracture, right foot, subsequent encounter for fracture with routine healing: Secondary | ICD-10-CM | POA: Diagnosis not present

## 2019-03-13 DIAGNOSIS — E559 Vitamin D deficiency, unspecified: Secondary | ICD-10-CM | POA: Diagnosis not present

## 2019-03-13 DIAGNOSIS — L718 Other rosacea: Secondary | ICD-10-CM | POA: Diagnosis not present

## 2019-03-13 DIAGNOSIS — Z9181 History of falling: Secondary | ICD-10-CM | POA: Diagnosis not present

## 2019-03-13 DIAGNOSIS — E785 Hyperlipidemia, unspecified: Secondary | ICD-10-CM | POA: Diagnosis not present

## 2019-03-13 NOTE — Progress Notes (Signed)
Hospital follow up  Assessment and Plan: Hospital visit follow up for   NEED TO RESCHEDULE DUE TO PHYSICAL THERAPY COMING TO HER HOUSE  All medications were reviewed with patient and family and fully reconciled. All questions answered fully, and patient and family members were encouraged to call the office with any further questions or concerns. Discussed goal to avoid readmission related to this diagnosis.  There are no discontinued medications.  Over 40 minutes of exam, counseling, chart review, and complex, high/moderate level critical decision making was performed this visit.   Future Appointments  Date Time Provider [place]  03/18/2019 11:30 AM Vicie Mutters, PA-C [facility] None  03/19/2019  1:15 PM [name], MD [facility] None  03/20/2019 10:15 AM Elsie Ra R, PT [facility] None  05/06/2019  2:00 PM Vicie Mutters, PA-C [facility] None  11/18/2019  9:00 AM Vicie Mutters, PA-C [facility] None     HPI 74 y.o.female presents for follow up for transition from recent hospitalization or SNIF stay. Admit date to the hospital was 03/04/19, patient was discharged from the hospital on 03/06/19 and our clinical staff contacted the office the day after discharge to set up a follow up appointment. The discharge summary, medications, and diagnostic test results were reviewed before meeting with the patient. The patient was admitted for:  who was admitted 03/04/2019 for operative treatment of right knee arthritis. She had right TKA on 03/04/2019 with Dr. Lorin Mercy. She is on oxycodone and  Home health is involved for coming out to her house.    Current Outpatient Medications (Endocrine & Metabolic):  .  Estradiol-Estriol-Progesterone (BIEST/PROGESTERONE) CREA, Place onto the skin. Marland Kitchen05 mg/ml cream to apply daily .  Progesterone Micronized 10 % CREA, Place onto the skin. Patient applies 2 % cream daily  Current Outpatient Medications (Cardiovascular):  Marland Kitchen  Colesevelam HCl 3.75 g PACK, LET 1  PACKET SIT IN WATER OR ORANGE JUICE THEN MIX AGAIN BEFORE DRINKING ONCE DAILY   Current Outpatient Medications (Analgesics):  .  aspirin (BAYER ASPIRIN) 325 MG tablet, Take 1 tablet (325 mg total) by mouth daily. Take one aspirin daily for 4 wks then stop .  oxyCODONE-acetaminophen (PERCOCET) 5-325 MG tablet, Take 1-2 tablets by mouth every 6 (six) hours as needed for severe pain.   Current Outpatient Medications (Other):  .  amphetamine-dextroamphetamine (ADDERALL) 30 MG tablet, Take 1/2 to 1 tablet 2 x /day as needed for Focus & Concentration (Patient taking differently: Take 15-30 mg by mouth See admin instructions. Take 1/2 to 1 tablet 2 x /day as needed for Focus & Concentration) .  ascorbic acid (VITAMIN C) 500 MG tablet, Take 500 mg by mouth daily. .  Cholecalciferol (VITAMIN D3) 5000 UNITS TABS, Take 10,000 Units by mouth. .  famciclovir (FAMVIR) 500 MG tablet, Take 1 tablet (500 mg total) by mouth 2 (two) times daily. (Patient taking differently: Take 500 mg by mouth 2 (two) times daily as needed (fever blisters). ) .  methocarbamol (ROBAXIN) 500 MG tablet, Take 1 tablet (500 mg total) by mouth every 6 (six) hours as needed for muscle spasms.  Past Medical History:  Diagnosis Date  . Allergy   . Anxiety   . Attention deficit disorder (ADD)   . Cancer [facility])    ? basal/squam cell on chest, and forehead  . Depression   . Dry eyes   . GERD (gastroesophageal reflux disease)   . HSV infection   . Lumbar degenerative disc disease   . Ocular rosacea   .  Osteopenia      Allergies  Allergen Reactions  . Prednisone Nausea And Vomiting  . Statins     paralyzing  . Strattera [Atomoxetine Hcl]     Increased sadness    ROS: all negative except above.   Physical Exam: There were no vitals filed for this visit. There were no vitals taken for this visit. General Appearance:Well sounding, in no apparent distress.  ENT/Mouth: No hoarseness, No cough for duration of visit.   Respiratory: completing full sentences without distress, without audible wheeze Neuro: Awake and oriented X 3,  Psych:  Insight and Judgment appropriate.    Vicie Mutters, PA-C 1:43 PM [facility]

## 2019-03-15 DIAGNOSIS — Z471 Aftercare following joint replacement surgery: Secondary | ICD-10-CM | POA: Diagnosis not present

## 2019-03-15 DIAGNOSIS — M8580 Other specified disorders of bone density and structure, unspecified site: Secondary | ICD-10-CM | POA: Diagnosis not present

## 2019-03-15 DIAGNOSIS — E785 Hyperlipidemia, unspecified: Secondary | ICD-10-CM | POA: Diagnosis not present

## 2019-03-15 DIAGNOSIS — Z859 Personal history of malignant neoplasm, unspecified: Secondary | ICD-10-CM | POA: Diagnosis not present

## 2019-03-15 DIAGNOSIS — E559 Vitamin D deficiency, unspecified: Secondary | ICD-10-CM | POA: Diagnosis not present

## 2019-03-15 DIAGNOSIS — Z96651 Presence of right artificial knee joint: Secondary | ICD-10-CM | POA: Diagnosis not present

## 2019-03-15 DIAGNOSIS — M84374D Stress fracture, right foot, subsequent encounter for fracture with routine healing: Secondary | ICD-10-CM | POA: Diagnosis not present

## 2019-03-15 DIAGNOSIS — M5136 Other intervertebral disc degeneration, lumbar region: Secondary | ICD-10-CM | POA: Diagnosis not present

## 2019-03-15 DIAGNOSIS — Z96641 Presence of right artificial hip joint: Secondary | ICD-10-CM | POA: Diagnosis not present

## 2019-03-15 DIAGNOSIS — K219 Gastro-esophageal reflux disease without esophagitis: Secondary | ICD-10-CM | POA: Diagnosis not present

## 2019-03-15 DIAGNOSIS — L718 Other rosacea: Secondary | ICD-10-CM | POA: Diagnosis not present

## 2019-03-15 DIAGNOSIS — Z9181 History of falling: Secondary | ICD-10-CM | POA: Diagnosis not present

## 2019-03-18 ENCOUNTER — Telehealth: Payer: Self-pay | Admitting: *Deleted

## 2019-03-18 ENCOUNTER — Encounter: Payer: Self-pay | Admitting: Physician Assistant

## 2019-03-18 ENCOUNTER — Other Ambulatory Visit: Payer: Self-pay

## 2019-03-18 ENCOUNTER — Ambulatory Visit: Payer: Medicare Other | Admitting: Physician Assistant

## 2019-03-18 VITALS — BP 120/85 | HR 65 | Resp 15 | Wt 189.0 lb

## 2019-03-18 DIAGNOSIS — Z471 Aftercare following joint replacement surgery: Secondary | ICD-10-CM | POA: Diagnosis not present

## 2019-03-18 DIAGNOSIS — Z9181 History of falling: Secondary | ICD-10-CM | POA: Diagnosis not present

## 2019-03-18 DIAGNOSIS — L718 Other rosacea: Secondary | ICD-10-CM | POA: Diagnosis not present

## 2019-03-18 DIAGNOSIS — Z96651 Presence of right artificial knee joint: Secondary | ICD-10-CM | POA: Diagnosis not present

## 2019-03-18 DIAGNOSIS — M5136 Other intervertebral disc degeneration, lumbar region: Secondary | ICD-10-CM | POA: Diagnosis not present

## 2019-03-18 DIAGNOSIS — Z96641 Presence of right artificial hip joint: Secondary | ICD-10-CM | POA: Diagnosis not present

## 2019-03-18 DIAGNOSIS — E559 Vitamin D deficiency, unspecified: Secondary | ICD-10-CM | POA: Diagnosis not present

## 2019-03-18 DIAGNOSIS — Z859 Personal history of malignant neoplasm, unspecified: Secondary | ICD-10-CM | POA: Diagnosis not present

## 2019-03-18 DIAGNOSIS — E785 Hyperlipidemia, unspecified: Secondary | ICD-10-CM | POA: Diagnosis not present

## 2019-03-18 DIAGNOSIS — K219 Gastro-esophageal reflux disease without esophagitis: Secondary | ICD-10-CM | POA: Diagnosis not present

## 2019-03-18 DIAGNOSIS — M84374D Stress fracture, right foot, subsequent encounter for fracture with routine healing: Secondary | ICD-10-CM | POA: Diagnosis not present

## 2019-03-18 DIAGNOSIS — M8580 Other specified disorders of bone density and structure, unspecified site: Secondary | ICD-10-CM | POA: Diagnosis not present

## 2019-03-18 NOTE — Telephone Encounter (Signed)
Patient called to ask for refill of pain medication. Have about 6 tablets left until tomorrow, when she is scheduled to see Dr. Lorin Mercy at 1:15 pm.

## 2019-03-19 ENCOUNTER — Ambulatory Visit (INDEPENDENT_AMBULATORY_CARE_PROVIDER_SITE_OTHER): Payer: Medicare Other | Admitting: Orthopaedic Surgery

## 2019-03-19 ENCOUNTER — Encounter: Payer: Self-pay | Admitting: Orthopaedic Surgery

## 2019-03-19 ENCOUNTER — Other Ambulatory Visit: Payer: Self-pay

## 2019-03-19 ENCOUNTER — Ambulatory Visit (INDEPENDENT_AMBULATORY_CARE_PROVIDER_SITE_OTHER): Payer: Medicare Other

## 2019-03-19 ENCOUNTER — Other Ambulatory Visit: Payer: Self-pay | Admitting: Orthopaedic Surgery

## 2019-03-19 ENCOUNTER — Telehealth: Payer: Self-pay | Admitting: *Deleted

## 2019-03-19 VITALS — Ht 62.0 in | Wt 189.0 lb

## 2019-03-19 DIAGNOSIS — Z96651 Presence of right artificial knee joint: Secondary | ICD-10-CM

## 2019-03-19 MED ORDER — OXYCODONE-ACETAMINOPHEN 5-325 MG PO TABS: 1.0000 | ORAL_TABLET | Freq: Four times a day (QID) | ORAL | 0 refills | Status: DC | PRN

## 2019-03-19 NOTE — Telephone Encounter (Signed)
Ortho bundle 14 day in office visit completed. 

## 2019-03-19 NOTE — Care Plan (Signed)
RNCM met with patient in office today during her 2 week post-op with Dr. Lorin Mercy. She verbalized she is doing well. Had pain medication refilled this morning. Steri-strips in place over incision. These were changed during office visit today. X-ray in office reviewed by MD. OPPT scheduled to begin tomorrow at [facility]. Reminded to contact CM with any questions/needs/concerns.

## 2019-03-19 NOTE — Progress Notes (Signed)
   Post-Op Visit Note   Patient: Kathy Cook           Date of Birth: 1945-10-25           MRN: [id number] Visit Date: 03/19/2019 PCP: Unk Pinto, MD   Assessment & Plan:  Chief Complaint:  Chief Complaint  Patient presents with  . Right Knee - Routine Post Op    03/04/2019 Right TKA   Visit Diagnoses:  1. Status post total knee replacement, right     Plan: Patient's flexing 208.  She lacks 4 to 5 degrees extension and is still working on this.  Quad strength is progressing.  She starts outpatient therapy tomorrow.  Percocet renewed today.  She is happy with the results of surgery.  She will progress to a cane with therapy.  Follow-Up Instructions: Return if symptoms worsen or fail to improve.   Orders:  Orders Placed This Encounter  Procedures  . XR Knee 1-2 Views Right   No orders of the defined types were placed in this encounter.   Imaging: No results found.  PMFS History: Patient Active Problem List   Diagnosis Date Noted  . Status post total hip replacement, right 03/05/2019  . Arthritis of right knee 03/04/2019  . Unilateral primary osteoarthritis, right knee 02/05/2019  . Knee locking, right 01/09/2019  . Metatarsal stress fracture, right, initial encounter 10/03/2016  . DJD (degenerative joint disease) 06/09/2016  . Labile hypertension 08/01/2014  . Vitamin D deficiency 08/01/2014  . Medication management 08/01/2014  . Obesity 07/04/2014  . Hyperlipemia 06/27/2013  . Attention deficit disorder (ADD)   . GERD (gastroesophageal reflux disease)   . Allergy   . Depression, major, in remission ([place])   . Anemia   . Ocular rosacea   . HSV infection   . Osteopenia    Past Medical History:  Diagnosis Date  . Allergy   . Anxiety   . Attention deficit disorder (ADD)   . Cancer [facility])    ? basal/squam cell on chest, and forehead  . Depression   . Dry eyes   . GERD (gastroesophageal reflux disease)   . HSV infection   . Lumbar degenerative disc  disease   . Ocular rosacea   . Osteopenia     Family History  Problem Relation Age of Onset  . Stroke Father   . Diabetes Father   . Hypertension Mother   . Depression Mother   . Macular degeneration Mother     Past Surgical History:  Procedure Laterality Date  . SKIN BIOPSY     ?basal/squam cell on chest  . TONSILLECTOMY AND ADENOIDECTOMY    . TOTAL KNEE ARTHROPLASTY Right 03/04/2019   Procedure: RIGHT TOTAL KNEE ARTHROPLASTY;  Surgeon: [name], MD;  Location: [place];  Service: Orthopedics;  Laterality: Right;   Social History   Occupational History  . Not on file  Tobacco Use  . Smoking status: Former Smoker    Packs/day: 0.50    Years: 15.00    Pack years: 7.50    Types: Cigarettes    Quit date: 03/23/1980    Years since quitting: 39.0  . Smokeless tobacco: Never Used  Substance and Sexual Activity  . Alcohol use: Yes    Alcohol/week: 0.0 standard drinks    Comment: social  . Drug use: No  . Sexual activity: Yes

## 2019-03-19 NOTE — Progress Notes (Signed)
Hospital follow up  Assessment and Plan:  Arthritis of right knee Improving, continue PT  S/P total knee arthroplasty, right Doing well, no fever, no chills.  Continue PT  Class 1 obesity due to excess calories with serious comorbidity and body mass index (BMI) of 32.0 to 32.9 in adult - follow up 3 months for progress monitoring - increase veggies, decrease carbs - long discussion about weight loss, diet, and exercise  Vitamin D deficiency Continue vitamin d for now, take with fatty foods will check in March.     All medications were reviewed with patient and family and fully reconciled. All questions answered fully, and patient and family members were encouraged to call the office with any further questions or concerns. Discussed goal to avoid readmission related to this diagnosis.  There are no discontinued medications.  Over 40 minutes of exam, counseling, chart review, and complex, high/moderate level critical decision making was performed this visit.   Future Appointments  Date Time Provider [place]  03/26/2019 11:00 AM Faustino Congress F, PT [facility] None  03/29/2019  1:15 PM Faustino Congress F, PT [facility] None  04/05/2019  1:15 PM Faustino Congress F, PT [facility] None  04/09/2019  2:45 PM Elsie Ra R, PT [facility] None  04/10/2019 11:45 AM Elsie Ra R, PT [facility] None  04/12/2019 11:45 AM Elsie Ra R, PT [facility] None  04/15/2019 11:45 AM Elsie Ra R, PT [facility] None  04/17/2019 11:45 AM Elsie Ra R, PT [facility] None  04/19/2019 11:45 AM Elsie Ra R, PT [facility] None  04/22/2019 11:45 AM Elsie Ra R, PT [facility] None  04/22/2019  1:15 PM [place] [facility]  04/24/2019 11:45 AM Elsie Ra R, PT [facility] None  04/26/2019  1:15 PM Elsie Ra R, PT [facility] None  04/29/2019 11:45 AM Debbe Odea, PT [facility] None  05/01/2019 11:45 AM Debbe Odea, PT [facility] None  05/03/2019 11:45 AM Debbe Odea, PT [facility] None  05/06/2019  2:00 PM  Vicie Mutters, PA-C [facility] None  11/18/2019  9:00 AM Vicie Mutters, PA-C [facility] None     HPI 74 y.o.female presents for follow up for transition from recent hospitalization or SNIF stay. Admit date to the hospital was 03/04/19, patient was discharged from the hospital on 03/06/19 and our clinical staff contacted the office the day after discharge to set up a follow up appointment. The discharge summary, medications, and diagnostic test results were reviewed before meeting with the patient. The patient was admitted for:  who was admitted 03/04/2019 for operative treatment of right knee arthritis. She had right TKA on 03/04/2019 with Dr. Lorin Mercy. She states she is 14 days out from her surgery, her pain level is decreasing, she is improving and has been cutting back on it.  Physical therapy is involved for coming out to her house but she is going to start out patient next week,  still working on getting it straight.  No fever, chills. No swelling, hard cord, swelling.   She is asking about her vitamin D, started to have decreased vitamin D with colesevelam, she will try to take the vitamin D at different times and she will try to take it with fatty foods/meals.   Current Outpatient Medications (Endocrine & Metabolic):  .  Estradiol-Estriol-Progesterone (BIEST/PROGESTERONE) CREA, Place onto the skin. Marland Kitchen05 mg/ml cream to apply daily .  Progesterone Micronized 10 % CREA, Place onto the skin. Patient applies 2 % cream daily  Current Outpatient Medications (Cardiovascular):  .  Colesevelam HCl 3.75 g PACK, LET 1 PACKET SIT IN WATER OR ORANGE JUICE THEN MIX AGAIN BEFORE DRINKING ONCE DAILY   Current Outpatient Medications (Analgesics):  .  aspirin (BAYER ASPIRIN) 325 MG tablet, Take 1 tablet (325 mg total) by mouth daily. Take one aspirin daily for 4 wks then stop .  oxyCODONE-acetaminophen (PERCOCET) 5-325 MG tablet, Take 1-2 tablets by mouth every 6 (six) hours as needed for severe pain.   Current  Outpatient Medications (Other):  .  amphetamine-dextroamphetamine (ADDERALL) 30 MG tablet, Take 1/2 to 1 tablet 2 x /day as needed for Focus & Concentration (Patient taking differently: Take 15-30 mg by mouth See admin instructions. Take 1/2 to 1 tablet 2 x /day as needed for Focus & Concentration) .  ascorbic acid (VITAMIN C) 500 MG tablet, Take 500 mg by mouth daily. .  Cholecalciferol (VITAMIN D3) 5000 UNITS TABS, Take 10,000 Units by mouth. .  famciclovir (FAMVIR) 500 MG tablet, Take 1 tablet (500 mg total) by mouth 2 (two) times daily. (Patient taking differently: Take 500 mg by mouth 2 (two) times daily as needed (fever blisters). ) .  methocarbamol (ROBAXIN) 500 MG tablet, Take 1 tablet (500 mg total) by mouth every 6 (six) hours as needed for muscle spasms.  Past Medical History:  Diagnosis Date  . Allergy   . Anxiety   . Attention deficit disorder (ADD)   . Cancer [facility])    ? basal/squam cell on chest, and forehead  . Depression   . Dry eyes   . GERD (gastroesophageal reflux disease)   . HSV infection   . Lumbar degenerative disc disease   . Ocular rosacea   . Osteopenia      Allergies  Allergen Reactions  . Prednisone Nausea And Vomiting  . Statins     paralyzing  . Strattera [Atomoxetine Hcl]     Increased sadness    ROS: all negative except above.   Physical Exam: There were no vitals filed for this visit. BP 110/70  General Appearance:Well sounding, in no apparent distress.  ENT/Mouth: No hoarseness, No cough for duration of visit.  Respiratory: completing full sentences without distress, without audible wheeze Neuro: Awake and oriented X 3,  Psych:  Insight and Judgment appropriate.    Vicie Mutters, PA-C 4:08 PM [facility]

## 2019-03-20 ENCOUNTER — Ambulatory Visit: Payer: Medicare Other | Admitting: Physical Therapy

## 2019-03-20 ENCOUNTER — Encounter: Payer: Self-pay | Admitting: Physical Therapy

## 2019-03-20 DIAGNOSIS — M6281 Muscle weakness (generalized): Secondary | ICD-10-CM

## 2019-03-20 DIAGNOSIS — M25561 Pain in right knee: Secondary | ICD-10-CM | POA: Diagnosis not present

## 2019-03-20 DIAGNOSIS — R2689 Other abnormalities of gait and mobility: Secondary | ICD-10-CM

## 2019-03-20 DIAGNOSIS — M25661 Stiffness of right knee, not elsewhere classified: Secondary | ICD-10-CM | POA: Diagnosis not present

## 2019-03-20 DIAGNOSIS — R600 Localized edema: Secondary | ICD-10-CM | POA: Diagnosis not present

## 2019-03-20 NOTE — Therapy (Signed)
[facility] Physical Therapy [address] [place], [place], [address] Phone: [phone]   Fax:  [phone]  Physical Therapy Evaluation  Patient Details  Name: Kathy Cook MRN: [id number] Date of Birth: [birth year] Referring Provider (PT): [name], MD   Encounter Date: 03/20/2019  PT End of Session - 03/20/19 1127    Visit Number  1    Number of Visits  18    Date for PT Re-Evaluation  05/15/19    Authorization Type  [employer] MCR    PT Start Time  [id number]    PT Stop Time  1100    PT Time Calculation (min)  45 min    Activity Tolerance  Patient tolerated treatment well    Behavior During Therapy  Anxious;WFL for tasks assessed/performed       Past Medical History:  Diagnosis Date  . Allergy   . Anxiety   . Attention deficit disorder (ADD)   . Cancer [facility])    ? basal/squam cell on chest, and forehead  . Depression   . Dry eyes   . GERD (gastroesophageal reflux disease)   . HSV infection   . Lumbar degenerative disc disease   . Ocular rosacea   . Osteopenia     Past Surgical History:  Procedure Laterality Date  . SKIN BIOPSY     ?basal/squam cell on chest  . TONSILLECTOMY AND ADENOIDECTOMY    . TOTAL KNEE ARTHROPLASTY Right 03/04/2019   Procedure: RIGHT TOTAL KNEE ARTHROPLASTY;  Surgeon: [name], MD;  Location: [place];  Service: Orthopedics;  Laterality: Right;    There were no vitals filed for this visit.   Subjective Assessment - 03/20/19 1017    Subjective  She had Rt TKA 03/03/18, She had HHPT that finished 03/16/18. She was independnt PLOF and did not need AD. She now is using RW for ambulation. She is having pain and difficulty with about every ADL since surgery and has to go up/down 15-20 steps at home.    Pertinent History  PMH: Rt TKA 03/04/19, Ca,lumbar DDD, osteopenia,    How long can you sit comfortably?  20    How long can you stand comfortably?  5 min    Patient Stated Goals  get the pain down and get back to normsl     Currently in Pain?  Yes    Pain Score  7     Pain Location  Knee    Pain Orientation  Right    Pain Descriptors / Indicators  Aching    Pain Type  Surgical pain    Pain Onset  More than a month ago    Pain Frequency  Constant    Aggravating Factors   any activity    Pain Relieving Factors  ice, laying down    Multiple Pain Sites  No         [facility] PT Assessment - 03/20/19 0001      Assessment   Medical Diagnosis  Rt TKA    Referring Provider (PT)  [name], MD    Onset Date/Surgical Date  03/04/19    Next MD Visit  PRN      Precautions   Precautions  None      Restrictions   Weight Bearing Restrictions  No      Balance Screen   Has the patient fallen in the past 6 months  No      [place] residence  Additional Comments  has 15-20 steps      Prior Function   Level of Independence  Independent      Cognition   Overall Cognitive Status  Within Functional Limits for tasks assessed      Observation/Other Assessments   Observations  moderate edema, mild redness, no warmth, incision looks closed with no drainage, steri strips still applied      Observation/Other Assessments-Edema    Edema  Circumferential      Circumferential Edema   Circumferential - Right  18.25 in knee jt line    Circumferential - Left   17.25 inch knee jt line      Sensation   Light Touch  Appears Intact      ROM / Strength   AROM / PROM / Strength  AROM;PROM;Strength      AROM   AROM Assessment Site  Knee    Right/Left Knee  Right    Right Knee Extension  -20    Right Knee Flexion  85   AAROM with strap     PROM   PROM Assessment Site  Knee    Right/Left Knee  Right    Right Knee Extension  -15    Right Knee Flexion  87      Strength   Overall Strength Comments  Rt hip flexion 3+. Rt hip abd 4, Rt knee flexion 4. Rt knee extension 3+ all tested in sitting      Flexibility   Soft Tissue Assessment /Muscle Length  --   tight hip  flexor, quad, gastroc, hamstring on Rt     Palpation   Palpation comment  TTP anterior knee      Transfers   Transfers  Independent with all Transfers    Comments  increased time needed due to pain and knee stiffness      Ambulation/Gait   Ambulation/Gait  Yes    Ambulation/Gait Assistance  6: Modified independent (Device/Increase time)    Ambulation Distance (Feet)  100 Feet    Assistive device  Rolling walker    Gait Pattern  Step-through pattern;Decreased stance time - right;Decreased step length - right;Decreased hip/knee flexion - right;Decreased weight shift to right;Antalgic    Gait velocity  slower                Objective measurements completed on examination: See above findings.      [facility] Adult PT Treatment/Exercise - 03/20/19 0001      Exercises   Exercises  Knee/Hip      Knee/Hip Exercises: Stretches   Active Hamstring Stretch  Right;2 reps;30 seconds    Active Hamstring Stretch Limitations  supine with strap    Quad Stretch  Right;2 reps;30 seconds    Other Knee/Hip Stretches  supine heel prop 30 sec X 3 reps with OP mobilizations      Knee/Hip Exercises: Supine   Heel Slides  AAROM;Right;10 reps    Heel Slides Limitations  10 sec hold with strap      Modalities   Modalities  Vasopneumatic      Vasopneumatic   Number Minutes Vasopneumatic   10 minutes    Vasopnuematic Location   Knee    Vasopneumatic Pressure  Low    Vasopneumatic Temperature   34      Manual Therapy   Manual therapy comments  Rt knee PROM gentle to tolerance flexion and extension             PT Education - 03/20/19 1126  Education Details  HEP, POC, importance of stretching her knee more at home    Person(s) Educated  Patient    Methods  Explanation;Demonstration;Verbal cues;Handout    Comprehension  Verbalized understanding;Returned demonstration;Need further instruction       PT Short Term Goals - 03/20/19 1140      PT SHORT TERM GOAL #1   Title  she  will be indpendent with initial HEP    Time  4    Period  Weeks    Status  New    Target Date  04/17/19      PT SHORT TERM GOAL #2   Title  She will report less than 5/10 overall pain    Baseline  7    Time  4    Period  Weeks    Status  New      PT SHORT TERM GOAL #3   Title  WIll improve knee ROM 10-100 to improve function.    Status  New        PT Long Term Goals - 03/20/19 1154      PT LONG TERM GOAL #1   Title  She will be indpendent with all HEP issued. (target for all goals is 05/15/18)    Time  8    Period  Weeks    Status  New      PT LONG TERM GOAL #2   Title  Will reduce pain to overall less than 3/10 with ususal activity.      PT LONG TERM GOAL #3   Title  Pt will be able to ambulate community distances no AD WFL gait pattern and be able to negotiate one flight of stairs reciprocally.    Time  8    Period  Weeks    Status  New      PT LONG TERM GOAL #4   Title  Pt will improve Rt knee AROM 3-115 to improve function.    Time  8    Period  Weeks    Status  New      PT LONG TERM GOAL #5   Title  Pt will increased Rt knee strength to 5/5 and Rt hip strength to 4+/5 MMT to improve function    Status  New             Plan - 03/20/19 1129    Clinical Impression Statement  Pt presents with Rt TKA on 03/04/19. She has increased Rt knee pain, decreased ROM, edema, LE weakness, and difficulty with gait and standing activities. She will benefit from skilled PT to address her deficits.    Personal Factors and Comorbidities  Comorbidity 3+    Comorbidities  PMH: Rt TKA 03/04/19, Ca,lumbar DDD, osteopenia,    Examination-Activity Limitations  Squat;Stairs;Stand;Lift;Dressing;Locomotion Level;Transfers    Examination-Participation Restrictions  Cleaning;Meal Prep;Community Activity;Driving;Laundry;Shop    Stability/Clinical Decision Making  Evolving/Moderate complexity    Clinical Decision Making  Moderate    Rehab Potential  Good    PT Frequency  3x / week    2-3   PT Treatment/Interventions  ADLs/Self Care Home Management;Cryotherapy;Moist Heat;Gait training;Stair training;Therapeutic activities;Therapeutic exercise;Balance training;Neuromuscular re-education;Manual techniques;Passive range of motion;Dry needling;Joint Manipulations;Taping;Vasopneumatic Device    PT Next Visit Plan  needs ROM, LE strength, gait progression off of AD as able, stairs when able, vaso for swelling PRN    PT Home Exercise Plan  Access Code: [facility]    Consulted and Agree with Plan of Care  Patient  Patient will benefit from skilled therapeutic intervention in order to improve the following deficits and impairments:  Abnormal gait, Decreased activity tolerance, Decreased balance, Decreased mobility, Decreased endurance, Decreased range of motion, Decreased strength, Increased edema, Difficulty walking, Increased fascial restricitons, Increased muscle spasms, Impaired flexibility, Pain  Visit Diagnosis: Acute pain of right knee  Stiffness of right knee, not elsewhere classified  Muscle weakness (generalized)  Other abnormalities of gait and mobility  Localized edema     Problem List Patient Active Problem List   Diagnosis Date Noted  . Status post total hip replacement, right 03/05/2019  . Arthritis of right knee 03/04/2019  . Unilateral primary osteoarthritis, right knee 02/05/2019  . Knee locking, right 01/09/2019  . Metatarsal stress fracture, right, initial encounter 10/03/2016  . DJD (degenerative joint disease) 06/09/2016  . Labile hypertension 08/01/2014  . Vitamin D deficiency 08/01/2014  . Medication management 08/01/2014  . Obesity 07/04/2014  . Hyperlipemia 06/27/2013  . Attention deficit disorder (ADD)   . GERD (gastroesophageal reflux disease)   . Allergy   . Depression, major, in remission ([place])   . Anemia   . Ocular rosacea   . HSV infection   . Osteopenia     Silvestre Mesi 03/20/2019, 12:29 PM  [facility] Physical Therapy [address] [place], [place], [address] Phone: [phone]   Fax:  [phone]  Name: TAMICA DUNLEAVY MRN: [id number] Date of Birth: May 25, 1945

## 2019-03-20 NOTE — Patient Instructions (Signed)
Access Code: [facility]  URL: [email or web address]  Date: 03/20/2019  Prepared by: Elsie Ra   Exercises  Supine Quadriceps Stretch with Strap on Table - 2 sets - 30 hold - 2x daily - 6x weekly  Supine Hamstring Stretch with Strap - 3 sets - 30 hold - 2x daily - 6x weekly  Supine Heel Slide with Strap - 10 reps - 2-3 sets - 5 hold - 2x daily - 6x weekly  Supine Active Straight Leg Raise - 10 reps - 1-3 sets - 2x daily - 6x weekly  Seated Long Arc Quad - 10 reps - 2-3 sets - 3 hold - 2x daily - 6x weekly  Supine Knee Extension Mobilization with Weight - 1 sets - 3-5 min hold - 2x daily - 6x weekly  Standing Hip Abduction - 10 reps - 2-3 sets - 2x daily - 6x weekly  Standing Hip Extension - 10 reps - 2-3 sets - 2x daily - 6x weekly  Mini Squat with Counter Support - 10 reps - 1-2 sets - 2x daily - 6x weekly

## 2019-03-21 ENCOUNTER — Encounter: Payer: Self-pay | Admitting: Physician Assistant

## 2019-03-21 ENCOUNTER — Telehealth: Payer: Self-pay | Admitting: *Deleted

## 2019-03-21 ENCOUNTER — Other Ambulatory Visit: Payer: Self-pay

## 2019-03-21 ENCOUNTER — Ambulatory Visit: Payer: Medicare Other | Admitting: Physician Assistant

## 2019-03-21 VITALS — BP 110/70

## 2019-03-21 DIAGNOSIS — E559 Vitamin D deficiency, unspecified: Secondary | ICD-10-CM | POA: Diagnosis not present

## 2019-03-21 DIAGNOSIS — Z96651 Presence of right artificial knee joint: Secondary | ICD-10-CM

## 2019-03-21 DIAGNOSIS — M1711 Unilateral primary osteoarthritis, right knee: Secondary | ICD-10-CM | POA: Diagnosis not present

## 2019-03-21 DIAGNOSIS — Z6832 Body mass index (BMI) 32.0-32.9, adult: Secondary | ICD-10-CM

## 2019-03-21 DIAGNOSIS — E6609 Other obesity due to excess calories: Secondary | ICD-10-CM | POA: Diagnosis not present

## 2019-03-21 MED ORDER — METHOCARBAMOL 500 MG PO TABS: 500.0000 mg | ORAL_TABLET | Freq: Four times a day (QID) | ORAL | 0 refills | Status: DC | PRN

## 2019-03-21 NOTE — Telephone Encounter (Signed)
Ok thanks 

## 2019-03-21 NOTE — Telephone Encounter (Signed)
Please advise 

## 2019-03-21 NOTE — Telephone Encounter (Signed)
I called patient and advised. 

## 2019-03-21 NOTE — Telephone Encounter (Signed)
rx refill sent to pharmacy 

## 2019-03-21 NOTE — Telephone Encounter (Signed)
Patient called and requested refill of her Robaxin. Thanks.

## 2019-03-27 ENCOUNTER — Ambulatory Visit: Payer: Medicare Other | Attending: Internal Medicine

## 2019-03-27 DIAGNOSIS — Z20822 Contact with and (suspected) exposure to covid-19: Secondary | ICD-10-CM

## 2019-03-28 LAB — NOVEL CORONAVIRUS, NAA: SARS-CoV-2, NAA: NOT DETECTED

## 2019-04-01 ENCOUNTER — Ambulatory Visit (INDEPENDENT_AMBULATORY_CARE_PROVIDER_SITE_OTHER): Payer: Medicare Other | Admitting: Physical Therapy

## 2019-04-01 ENCOUNTER — Other Ambulatory Visit: Payer: Self-pay

## 2019-04-01 ENCOUNTER — Other Ambulatory Visit: Payer: Self-pay | Admitting: Internal Medicine

## 2019-04-01 DIAGNOSIS — M25561 Pain in right knee: Secondary | ICD-10-CM

## 2019-04-01 DIAGNOSIS — M6281 Muscle weakness (generalized): Secondary | ICD-10-CM

## 2019-04-01 DIAGNOSIS — M25661 Stiffness of right knee, not elsewhere classified: Secondary | ICD-10-CM

## 2019-04-01 DIAGNOSIS — R600 Localized edema: Secondary | ICD-10-CM

## 2019-04-01 DIAGNOSIS — E785 Hyperlipidemia, unspecified: Secondary | ICD-10-CM

## 2019-04-01 DIAGNOSIS — R2689 Other abnormalities of gait and mobility: Secondary | ICD-10-CM

## 2019-04-01 MED ORDER — COLESEVELAM HCL 3.75 G PO PACK: PACK | ORAL | 3 refills | Status: DC

## 2019-04-01 NOTE — Therapy (Signed)
[facility] Physical Therapy [address] [place], [place], [address] Phone: [phone]   Fax:  [phone]  Physical Therapy Treatment  Patient Details  Name: Kathy Cook MRN: [id number] Date of Birth: 1946-01-04 Referring Provider (PT): [name], MD   Encounter Date: 04/01/2019  PT End of Session - 04/01/19 2019    Visit Number  2    Number of Visits  18    Date for PT Re-Evaluation  05/15/19    Authorization Type  [employer] MCR    PT Start Time  [id number]    PT Stop Time  1625    PT Time Calculation (min)  55 min    Activity Tolerance  Patient tolerated treatment well    Behavior During Therapy  Anxious;WFL for tasks assessed/performed       Past Medical History:  Diagnosis Date  . Allergy   . Anxiety   . Attention deficit disorder (ADD)   . Cancer [facility])    ? basal/squam cell on chest, and forehead  . Depression   . Dry eyes   . GERD (gastroesophageal reflux disease)   . HSV infection   . Lumbar degenerative disc disease   . Ocular rosacea   . Osteopenia     Past Surgical History:  Procedure Laterality Date  . SKIN BIOPSY     ?basal/squam cell on chest  . TONSILLECTOMY AND ADENOIDECTOMY    . TOTAL KNEE ARTHROPLASTY Right 03/04/2019   Procedure: RIGHT TOTAL KNEE ARTHROPLASTY;  Surgeon: [name], MD;  Location: [place];  Service: Orthopedics;  Laterality: Right;    There were no vitals filed for this visit.  Subjective Assessment - 04/01/19 2015    Subjective  I have been trying to do execises at home but my knee hurts and is very sensitive. Maybe I overdid things. Pain is 7/10 today in Rt knee    Pertinent History  PMH: Rt TKA 03/04/19, Ca,lumbar DDD, osteopenia,    How long can you sit comfortably?  20    How long can you stand comfortably?  5 min    Patient Stated Goals  get the pain down and get back to normsl    Pain Onset  More than a month ago         [facility] PT Assessment - 04/01/19 0001      Assessment   Medical Diagnosis   Rt TKA    Referring Provider (PT)  [name], MD    Onset Date/Surgical Date  03/04/19      AROM   Right Knee Extension  -15    Right Knee Flexion  90      PROM   Right Knee Extension  -12    Right Knee Flexion  95                   [facility] Adult PT Treatment/Exercise - 04/01/19 0001      Ambulation/Gait   Ambulation/Gait  Yes    Ambulation/Gait Assistance  6: Modified independent (Device/Increase time)    Ambulation Distance (Feet)  100 Feet   X2   Assistive device  Straight cane    Gait Pattern  Step-through pattern;Decreased stance time - right;Decreased step length - right;Decreased hip/knee flexion - right;Decreased weight shift to right;Antalgic    Gait velocity  slower      Knee/Hip Exercises: Stretches   Active Hamstring Stretch  Right;2 reps;30 seconds    Active Hamstring Stretch Limitations  supine with strap  Quad Stretch  Right;2 reps;30 seconds    Other Knee/Hip Stretches  supine heel prop 30 sec X 3 reps with OP mobilizations      Knee/Hip Exercises: Aerobic   Nustep  L5 X 8 min for knee ROM      Knee/Hip Exercises: Standing   Knee Flexion  Right;10 reps    Hip Flexion  Both;10 reps    Hip Abduction  Both;10 reps      Knee/Hip Exercises: Seated   Long Arc Quad  Right;2 sets;10 reps      Knee/Hip Exercises: Supine   Heel Slides  AAROM;Right;10 reps    Heel Slides Limitations  10 sec hold with strap      Modalities   Modalities  Electrical Stimulation      Electrical Stimulation   Electrical Stimulation Location  Rt knee    Electrical Stimulation Action  IFC    Electrical Stimulation Parameters  tolerance    Electrical Stimulation Goals  Pain      Manual Therapy   Manual therapy comments  Rt knee PROM gentle to tolerance flexion and extension               PT Short Term Goals - 03/20/19 1140      PT SHORT TERM GOAL #1   Title  she will be indpendent with initial HEP    Time  4    Period  Weeks    Status  New     Target Date  04/17/19      PT SHORT TERM GOAL #2   Title  She will report less than 5/10 overall pain    Baseline  7    Time  4    Period  Weeks    Status  New      PT SHORT TERM GOAL #3   Title  WIll improve knee ROM 10-100 to improve function.    Status  New        PT Long Term Goals - 03/20/19 1154      PT LONG TERM GOAL #1   Title  She will be indpendent with all HEP issued. (target for all goals is 05/15/18)    Time  8    Period  Weeks    Status  New      PT LONG TERM GOAL #2   Title  Will reduce pain to overall less than 3/10 with ususal activity.      PT LONG TERM GOAL #3   Title  Pt will be able to ambulate community distances no AD WFL gait pattern and be able to negotiate one flight of stairs reciprocally.    Time  8    Period  Weeks    Status  New      PT LONG TERM GOAL #4   Title  Pt will improve Rt knee AROM 3-115 to improve function.    Time  8    Period  Weeks    Status  New      PT LONG TERM GOAL #5   Title  Pt will increased Rt knee strength to 5/5 and Rt hip strength to 4+/5 MMT to improve function    Status  New            Plan - 04/01/19 2020    Clinical Impression Statement  Pt returns after missing 2 weeks of PT for only now her2nd PT visit [place]. She had  slight increase in ROM measurements but continues to struggle with knee ROM, strength, and activity tolerance. These were addresed during session as tolerated and she was trialed with TENS to help manage her pain. PT will continue to progress as able.    Personal Factors and Comorbidities  Comorbidity 3+    Comorbidities  PMH: Rt TKA 03/04/19, Ca,lumbar DDD, osteopenia,    Examination-Activity Limitations  Squat;Stairs;Stand;Lift;Dressing;Locomotion Level;Transfers    Examination-Participation Restrictions  Cleaning;Meal Prep;Community Activity;Driving;Laundry;Shop    Rehab Potential  Good    PT Frequency  3x / week    PT Duration  8 weeks    PT  Treatment/Interventions  ADLs/Self Care Home Management;Cryotherapy;Moist Heat;Gait training;Stair training;Therapeutic activities;Therapeutic exercise;Balance training;Neuromuscular re-education;Manual techniques;Passive range of motion;Dry needling;Joint Manipulations;Taping;Vasopneumatic Device    PT Next Visit Plan  needs ROM, LE strength, gait progression off of AD as able, stairs when able, vaso for swelling and Estim for pain PRN    PT Home Exercise Plan  Access Code: [facility]    Consulted and Agree with Plan of Care  Patient       Patient will benefit from skilled therapeutic intervention in order to improve the following deficits and impairments:  Abnormal gait, Decreased activity tolerance, Decreased balance, Decreased mobility, Decreased endurance, Decreased range of motion, Decreased strength, Increased edema, Difficulty walking, Increased fascial restricitons, Increased muscle spasms, Impaired flexibility, Pain  Visit Diagnosis: Acute pain of right knee  Stiffness of right knee, not elsewhere classified  Muscle weakness (generalized)  Other abnormalities of gait and mobility  Localized edema     Problem List Patient Active Problem List   Diagnosis Date Noted  . Status post total hip replacement, right 03/05/2019  . Arthritis of right knee 03/04/2019  . Unilateral primary osteoarthritis, right knee 02/05/2019  . Knee locking, right 01/09/2019  . Metatarsal stress fracture, right, initial encounter 10/03/2016  . DJD (degenerative joint disease) 06/09/2016  . Labile hypertension 08/01/2014  . Vitamin D deficiency 08/01/2014  . Medication management 08/01/2014  . Obesity 07/04/2014  . Hyperlipemia 06/27/2013  . Attention deficit disorder (ADD)   . GERD (gastroesophageal reflux disease)   . Allergy   . Depression, major, in remission ([place])   . Anemia   . Ocular rosacea   . HSV infection   . Osteopenia     Silvestre Mesi 04/01/2019, 8:24 PM  [facility] Physical Therapy [address] [place], [place], [address] Phone: [phone]   Fax:  [phone]  Name: Kathy Cook MRN: [id number] Date of Birth: 05-14-1945

## 2019-04-02 ENCOUNTER — Ambulatory Visit (INDEPENDENT_AMBULATORY_CARE_PROVIDER_SITE_OTHER): Payer: Medicare Other | Admitting: Physical Therapy

## 2019-04-02 DIAGNOSIS — R600 Localized edema: Secondary | ICD-10-CM

## 2019-04-02 DIAGNOSIS — M25661 Stiffness of right knee, not elsewhere classified: Secondary | ICD-10-CM

## 2019-04-02 DIAGNOSIS — R2689 Other abnormalities of gait and mobility: Secondary | ICD-10-CM

## 2019-04-02 DIAGNOSIS — M6281 Muscle weakness (generalized): Secondary | ICD-10-CM | POA: Diagnosis not present

## 2019-04-02 DIAGNOSIS — M25561 Pain in right knee: Secondary | ICD-10-CM

## 2019-04-02 NOTE — Therapy (Signed)
[facility] [address] [place], [place], [address] Phone: [phone]   Fax:  [phone]  Physical Therapy Treatment  Patient Details  Name: Kathy Cook MRN: [id number] Date of Birth: 07-May-1945 Referring Provider (PT): [name], MD   Encounter Date: 04/02/2019  PT End of Session - 04/02/19 1527    Visit Number  3    Number of Visits  18    Date for PT Re-Evaluation  05/15/19    Authorization Type  UHC MCR    PT Start Time  [id number]    PT Stop Time  1400    PT Time Calculation (min)  45 min    Activity Tolerance  Patient tolerated treatment well    Behavior During Therapy  [facility] for tasks assessed/performed       Past Medical History:  Diagnosis Date  . Allergy   . Anxiety   . Attention deficit disorder (ADD)   . Cancer [facility])    ? basal/squam cell on chest, and forehead  . Depression   . Dry eyes   . GERD (gastroesophageal reflux disease)   . HSV infection   . Lumbar degenerative disc disease   . Ocular rosacea   . Osteopenia     Past Surgical History:  Procedure Laterality Date  . SKIN BIOPSY     ?basal/squam cell on chest  . TONSILLECTOMY AND ADENOIDECTOMY    . TOTAL KNEE ARTHROPLASTY Right 03/04/2019   Procedure: RIGHT TOTAL KNEE ARTHROPLASTY;  Surgeon: [name], MD;  Location: [place];  Service: Orthopedics;  Laterality: Right;    There were no vitals filed for this visit.  Subjective Assessment - 04/02/19 1454    Subjective  My knee feels a little better, less sensitive but still is, less pain overall today but it stays so stiff    Pertinent History  PMH: Rt TKA 03/04/19, Ca,lumbar DDD, osteopenia,    How long can you sit comfortably?  20    How long can you stand comfortably?  5 min    Patient Stated Goals  get the pain down and get back to normsl    Pain Onset  More than a month ago       [facility] Adult PT Treatment/Exercise - 04/02/19 0001      Ambulation/Gait   Ambulation/Gait  Yes    Ambulation/Gait  Assistance  6: Modified independent (Device/Increase time)    Ambulation Distance (Feet)  100 Feet   X2   Assistive device  Straight cane    Gait Pattern  Step-through pattern;Decreased stance time - right;Decreased step length - right;Decreased hip/knee flexion - right;Decreased weight shift to right;Antalgic    Gait velocity  slower      Knee/Hip Exercises: Stretches   Active Hamstring Stretch  Right;2 reps;30 seconds    Active Hamstring Stretch Limitations  supine with strap    Quad Stretch  Right;2 reps;30 seconds    Other Knee/Hip Stretches  supine heel prop 20 sec X 5 reps with OP mobilizations      Knee/Hip Exercises: Aerobic   Recumbent Bike  5 min rocking for knee ROM    Nustep  L5 X 5 min for knee ROM      Knee/Hip Exercises: Standing   Knee Flexion  --    Hip Flexion  Both;10 reps    Hip Abduction  Both;10 reps      Knee/Hip Exercises: Seated   Long Arc Quad  Right;2 sets;10 reps    Heel  Slides  AAROM;10 reps    Heel Slides Limitations  10 sec hold using other side for overpressure    Other Seated Knee/Hip Exercises  seated heel prop with foot on chari 1 min X 3 reps    Hamstring Curl  Right;20 reps    Hamstring Limitations  L2 orange band      Knee/Hip Exercises: Supine   Heel Slides  AAROM;Right;10 reps    Heel Slides Limitations  10 sec hold with strap      Modalities   Modalities  Electrical Stimulation      Electrical Stimulation   Electrical Stimulation Location  Rt knee    Electrical Stimulation Action  pre mod, needed to turn up quad side more than hamstrring side    Electrical Stimulation Parameters  tolerance    Electrical Stimulation Goals  Pain      Manual Therapy   Manual therapy comments  Rt knee PROM gentle to tolerance flexion and extension               PT Short Term Goals - 03/20/19 1140      PT SHORT TERM GOAL #1   Title  she will be indpendent with initial HEP    Time  4    Period  Weeks    Status  New    Target Date   04/17/19      PT SHORT TERM GOAL #2   Title  She will report less than 5/10 overall pain    Baseline  7    Time  4    Period  Weeks    Status  New      PT SHORT TERM GOAL #3   Title  WIll improve knee ROM 10-100 to improve function.    Status  New        PT Long Term Goals - 03/20/19 1154      PT LONG TERM GOAL #1   Title  She will be indpendent with all HEP issued. (target for all goals is 05/15/18)    Time  8    Period  Weeks    Status  New      PT LONG TERM GOAL #2   Title  Will reduce pain to overall less than 3/10 with ususal activity.      PT LONG TERM GOAL #3   Title  Pt will be able to ambulate community distances no AD WFL gait pattern and be able to negotiate one flight of stairs reciprocally.    Time  8    Period  Weeks    Status  New      PT LONG TERM GOAL #4   Title  Pt will improve Rt knee AROM 3-115 to improve function.    Time  8    Period  Weeks    Status  New      PT LONG TERM GOAL #5   Title  Pt will increased Rt knee strength to 5/5 and Rt hip strength to 4+/5 MMT to improve function    Status  New            Plan - 04/02/19 2259    Clinical Impression Statement  She continues to struggle with knee ROM and session focused on stretching, AAROM, PROM, and manual therapy to address her ROM deficits. She did have reduction in hypersensitivity to her anterior knee today and some improvements in activity tolerance. She was again encouraged to stretch more at home and  spend more time with her leg propped out straight for more extension. PT will continue to progress as able.    Personal Factors and Comorbidities  Comorbidity 3+    Comorbidities  PMH: Rt TKA 03/04/19, Ca,lumbar DDD, osteopenia,    Examination-Activity Limitations  Squat;Stairs;Stand;Lift;Dressing;Locomotion Level;Transfers    Examination-Participation Restrictions  Cleaning;Meal Prep;Community Activity;Driving;Laundry;Shop    Rehab Potential  Good    PT Frequency  3x / week    PT  Duration  8 weeks    PT Treatment/Interventions  ADLs/Self Care Home Management;Cryotherapy;Moist Heat;Gait training;Stair training;Therapeutic activities;Therapeutic exercise;Balance training;Neuromuscular re-education;Manual techniques;Passive range of motion;Dry needling;Joint Manipulations;Taping;Vasopneumatic Device    PT Next Visit Plan  needs ROM, LE strength, gait progression off of AD as able, stairs when able, vaso for swelling and Estim for pain PRN    PT Home Exercise Plan  Access Code: [facility]    Consulted and Agree with Plan of Care  Patient       Patient will benefit from skilled therapeutic intervention in order to improve the following deficits and impairments:  Abnormal gait, Decreased activity tolerance, Decreased balance, Decreased mobility, Decreased endurance, Decreased range of motion, Decreased strength, Increased edema, Difficulty walking, Increased fascial restricitons, Increased muscle spasms, Impaired flexibility, Pain  Visit Diagnosis: Acute pain of right knee  Stiffness of right knee, not elsewhere classified  Muscle weakness (generalized)  Other abnormalities of gait and mobility  Localized edema     Problem List Patient Active Problem List   Diagnosis Date Noted  . Status post total hip replacement, right 03/05/2019  . Arthritis of right knee 03/04/2019  . Unilateral primary osteoarthritis, right knee 02/05/2019  . Knee locking, right 01/09/2019  . Metatarsal stress fracture, right, initial encounter 10/03/2016  . DJD (degenerative joint disease) 06/09/2016  . Labile hypertension 08/01/2014  . Vitamin D deficiency 08/01/2014  . Medication management 08/01/2014  . Obesity 07/04/2014  . Hyperlipemia 06/27/2013  . Attention deficit disorder (ADD)   . GERD (gastroesophageal reflux disease)   . Allergy   . Depression, major, in remission ([place])   . Anemia   . Ocular rosacea   . HSV infection   . Osteopenia     Debbe Odea,  PT,DPT 04/02/2019, 11:03 PM  [facility] [address] [place], [place], [address] Phone: [phone]   Fax:  [phone]  Name: TOTSIE KRAYNAK MRN: [id number] Date of Birth: 11/21/1945

## 2019-04-05 ENCOUNTER — Other Ambulatory Visit: Payer: Self-pay

## 2019-04-05 ENCOUNTER — Ambulatory Visit: Payer: Medicare Other | Admitting: Physical Therapy

## 2019-04-05 DIAGNOSIS — R600 Localized edema: Secondary | ICD-10-CM

## 2019-04-05 DIAGNOSIS — M25561 Pain in right knee: Secondary | ICD-10-CM | POA: Diagnosis not present

## 2019-04-05 DIAGNOSIS — M6281 Muscle weakness (generalized): Secondary | ICD-10-CM | POA: Diagnosis not present

## 2019-04-05 DIAGNOSIS — M25661 Stiffness of right knee, not elsewhere classified: Secondary | ICD-10-CM | POA: Diagnosis not present

## 2019-04-05 DIAGNOSIS — R2689 Other abnormalities of gait and mobility: Secondary | ICD-10-CM | POA: Diagnosis not present

## 2019-04-05 NOTE — Therapy (Signed)
[facility] [address] [place], [place], [address] Phone: [phone]   Fax:  [phone]  Physical Therapy Treatment  Patient Details  Name: Kathy Cook MRN: [id number] Date of Birth: Jul 01, 1945 Referring Provider (PT): [name], MD   Encounter Date: 04/05/2019  PT End of Session - 04/05/19 1405    Visit Number  4    Number of Visits  18    Date for PT Re-Evaluation  05/15/19    Authorization Type  UHC MCR    PT Start Time  [id number]    PT Stop Time  1400    PT Time Calculation (min)  45 min    Activity Tolerance  Patient tolerated treatment well    Behavior During Therapy  [facility] for tasks assessed/performed       Past Medical History:  Diagnosis Date  . Allergy   . Anxiety   . Attention deficit disorder (ADD)   . Cancer [facility])    ? basal/squam cell on chest, and forehead  . Depression   . Dry eyes   . GERD (gastroesophageal reflux disease)   . HSV infection   . Lumbar degenerative disc disease   . Ocular rosacea   . Osteopenia     Past Surgical History:  Procedure Laterality Date  . SKIN BIOPSY     ?basal/squam cell on chest  . TONSILLECTOMY AND ADENOIDECTOMY    . TOTAL KNEE ARTHROPLASTY Right 03/04/2019   Procedure: RIGHT TOTAL KNEE ARTHROPLASTY;  Surgeon: [name], MD;  Location: [place];  Service: Orthopedics;  Laterality: Right;    There were no vitals filed for this visit.  Subjective Assessment - 04/05/19 1404    Subjective  My knee feels a little better, pain is about 4 in my Rt knee    Pertinent History  PMH: Rt TKA 03/04/19, Ca,lumbar DDD, osteopenia,    How long can you sit comfortably?  20    How long can you stand comfortably?  5 min    Patient Stated Goals  get the pain down and get back to normsl    Pain Onset  More than a month ago                       [facility] Adult PT Treatment/Exercise - 04/05/19 0001      Knee/Hip Exercises: Stretches   Passive Hamstring Stretch  Right;3 reps;30  seconds    Quad Stretch  Right;3 reps;30 seconds    Other Knee/Hip Stretches  supine heel prop with 2 lb weight 1 min X 2    Other Knee/Hip Stretches  seated AAROM heelslides 10 sec X 10 reps      Knee/Hip Exercises: Aerobic   Nustep  L4 X 8 min      Knee/Hip Exercises: Standing   Hip Flexion  Both;15 reps    Hip Flexion Limitations  2 lbs    Hip Abduction  Both;15 reps    Abduction Limitations  2 lbs      Knee/Hip Exercises: Seated   Long Arc Quad  Right;2 sets;10 reps    Long Arc Quad Weight  2 lbs.    Hamstring Curl  Right;20 reps    Hamstring Limitations  L2 orange band    Sit to Sand  10 reps;without UE support   from raised mat table     Manual Therapy   Manual therapy comments  Rt knee PROM to tolerance flexion and extension, flexion and extension  mobs grade 2-3               PT Short Term Goals - 03/20/19 1140      PT SHORT TERM GOAL #1   Title  she will be indpendent with initial HEP    Time  4    Period  Weeks    Status  New    Target Date  04/17/19      PT SHORT TERM GOAL #2   Title  She will report less than 5/10 overall pain    Baseline  7    Time  4    Period  Weeks    Status  New      PT SHORT TERM GOAL #3   Title  WIll improve knee ROM 10-100 to improve function.    Status  New        PT Long Term Goals - 03/20/19 1154      PT LONG TERM GOAL #1   Title  She will be indpendent with all HEP issued. (target for all goals is 05/15/18)    Time  8    Period  Weeks    Status  New      PT LONG TERM GOAL #2   Title  Will reduce pain to overall less than 3/10 with ususal activity.      PT LONG TERM GOAL #3   Title  Pt will be able to ambulate community distances no AD WFL gait pattern and be able to negotiate one flight of stairs reciprocally.    Time  8    Period  Weeks    Status  New      PT LONG TERM GOAL #4   Title  Pt will improve Rt knee AROM 3-115 to improve function.    Time  8    Period  Weeks    Status  New      PT LONG  TERM GOAL #5   Title  Pt will increased Rt knee strength to 5/5 and Rt hip strength to 4+/5 MMT to improve function    Status  New            Plan - 04/05/19 1407    Clinical Impression Statement  Able to progress today with stretching and strengthening program with improved activity tolerance. PT will continue to progress as able.    Personal Factors and Comorbidities  Comorbidity 3+    Comorbidities  PMH: Rt TKA 03/04/19, Ca,lumbar DDD, osteopenia,    Examination-Activity Limitations  Squat;Stairs;Stand;Lift;Dressing;Locomotion Level;Transfers    Examination-Participation Restrictions  Cleaning;Meal Prep;Community Activity;Driving;Laundry;Shop    Stability/Clinical Decision Making  Evolving/Moderate complexity    Rehab Potential  Good    PT Frequency  3x / week    PT Duration  8 weeks    PT Treatment/Interventions  ADLs/Self Care Home Management;Cryotherapy;Moist Heat;Gait training;Stair training;Therapeutic activities;Therapeutic exercise;Balance training;Neuromuscular re-education;Manual techniques;Passive range of motion;Dry needling;Joint Manipulations;Taping;Vasopneumatic Device    PT Next Visit Plan  needs ROM, LE strength, gait progression off of AD as able, stairs when able, vaso for swelling and Estim for pain PRN    PT Home Exercise Plan  Access Code: [facility]    Consulted and Agree with Plan of Care  Patient       Patient will benefit from skilled therapeutic intervention in order to improve the following deficits and impairments:  Abnormal gait, Decreased activity tolerance, Decreased balance, Decreased mobility, Decreased endurance, Decreased range of motion, Decreased strength, Increased edema, Difficulty walking,  Increased fascial restricitons, Increased muscle spasms, Impaired flexibility, Pain  Visit Diagnosis: Acute pain of right knee  Stiffness of right knee, not elsewhere classified  Muscle weakness (generalized)  Other abnormalities of gait and  mobility  Localized edema     Problem List Patient Active Problem List   Diagnosis Date Noted  . Status post total hip replacement, right 03/05/2019  . Arthritis of right knee 03/04/2019  . Unilateral primary osteoarthritis, right knee 02/05/2019  . Knee locking, right 01/09/2019  . Metatarsal stress fracture, right, initial encounter 10/03/2016  . DJD (degenerative joint disease) 06/09/2016  . Labile hypertension 08/01/2014  . Vitamin D deficiency 08/01/2014  . Medication management 08/01/2014  . Obesity 07/04/2014  . Hyperlipemia 06/27/2013  . Attention deficit disorder (ADD)   . GERD (gastroesophageal reflux disease)   . Allergy   . Depression, major, in remission ([place])   . Anemia   . Ocular rosacea   . HSV infection   . Osteopenia     Silvestre Mesi 04/05/2019, 2:09 PM  [facility] [address] [place], [place], [address] Phone: [phone]   Fax:  [phone]  Name: Kathy Cook MRN: [id number] Date of Birth: 1945/07/19

## 2019-04-07 ENCOUNTER — Ambulatory Visit: Payer: Medicare Other | Attending: Internal Medicine

## 2019-04-07 DIAGNOSIS — Z23 Encounter for immunization: Secondary | ICD-10-CM | POA: Insufficient documentation

## 2019-04-07 NOTE — Progress Notes (Signed)
   Covid-19 Vaccination Clinic  Name:  Kathy Cook    MRN: [id number] DOB: 1945-03-16  04/07/2019  [name] was observed post Covid-19 immunization for 15 minutes without incidence. She was provided with Vaccine Information Sheet and instruction to access the V-Safe system.   [name] was instructed to call 911 with any severe reactions post vaccine: Marland Kitchen Difficulty breathing  . Swelling of your face and throat  . A fast heartbeat  . A bad rash all over your body  . Dizziness and weakness    Immunizations Administered    Name Date Dose VIS Date Route   Pfizer COVID-19 Vaccine 04/07/2019  3:31 PM 0.3 mL 02/08/2019 Intramuscular   Manufacturer: [place]   Lot: [id number]   Mesic: [id number]

## 2019-04-09 ENCOUNTER — Other Ambulatory Visit: Payer: Self-pay

## 2019-04-09 ENCOUNTER — Ambulatory Visit: Payer: Medicare Other | Admitting: Physical Therapy

## 2019-04-09 ENCOUNTER — Telehealth: Payer: Self-pay | Admitting: *Deleted

## 2019-04-09 DIAGNOSIS — M25661 Stiffness of right knee, not elsewhere classified: Secondary | ICD-10-CM

## 2019-04-09 DIAGNOSIS — R2689 Other abnormalities of gait and mobility: Secondary | ICD-10-CM | POA: Diagnosis not present

## 2019-04-09 DIAGNOSIS — R600 Localized edema: Secondary | ICD-10-CM | POA: Diagnosis not present

## 2019-04-09 DIAGNOSIS — M6281 Muscle weakness (generalized): Secondary | ICD-10-CM

## 2019-04-09 DIAGNOSIS — M25561 Pain in right knee: Secondary | ICD-10-CM | POA: Diagnosis not present

## 2019-04-09 NOTE — Therapy (Signed)
[facility] [address] [place], [place], [address] Phone: [phone]   Fax:  [phone]  Physical Therapy Treatment  Patient Details  Name: Kathy Cook MRN: [id number] Date of Birth: Feb 06, 1946 Referring Provider (PT): [name], MD   Encounter Date: 04/09/2019  PT End of Session - 04/09/19 1930    Visit Number  5    Number of Visits  18    Date for PT Re-Evaluation  05/15/19    Authorization Type  UHC MCR    PT Start Time  [id number]    PT Stop Time  1530    PT Time Calculation (min)  45 min    Activity Tolerance  Patient tolerated treatment well    Behavior During Therapy  [facility] for tasks assessed/performed       Past Medical History:  Diagnosis Date  . Allergy   . Anxiety   . Attention deficit disorder (ADD)   . Cancer [facility])    ? basal/squam cell on chest, and forehead  . Depression   . Dry eyes   . GERD (gastroesophageal reflux disease)   . HSV infection   . Lumbar degenerative disc disease   . Ocular rosacea   . Osteopenia     Past Surgical History:  Procedure Laterality Date  . SKIN BIOPSY     ?basal/squam cell on chest  . TONSILLECTOMY AND ADENOIDECTOMY    . TOTAL KNEE ARTHROPLASTY Right 03/04/2019   Procedure: RIGHT TOTAL KNEE ARTHROPLASTY;  Surgeon: [name], MD;  Location: [place];  Service: Orthopedics;  Laterality: Right;    There were no vitals filed for this visit.  Subjective Assessment - 04/09/19 1925    Subjective  I am walking better, knee pain is 3/10 overall    Pertinent History  PMH: Rt TKA 03/04/19, Ca,lumbar DDD, osteopenia,    How long can you sit comfortably?  20    How long can you stand comfortably?  5 min    Patient Stated Goals  get the pain down and get back to normsl    Pain Onset  More than a month ago                       [facility] Adult PT Treatment/Exercise - 04/09/19 0001      Ambulation/Gait   Gait Comments  100 ft X 2 no AD but still lacks knee ROM with gait       Knee/Hip Exercises: Stretches   Passive Hamstring Stretch  Right;3 reps;30 seconds    Quad Stretch  Right;3 reps;30 seconds    Other Knee/Hip Stretches  supine heel prop with 5 lb weight 1 min X 3    Other Knee/Hip Stretches  seated AAROM heelslides 10 sec X 10 reps      Knee/Hip Exercises: Aerobic   Nustep  L5 X 8 min for knee ROM      Knee/Hip Exercises: Standing   Forward Step Up  Right;10 reps;Hand Hold: 2;Step Height: 4"    Other Standing Knee Exercises  lateral walking at counter without UE support up/down X 3 reps      Knee/Hip Exercises: Seated   Long Arc Quad  Right;2 sets;10 reps    Long Arc Quad Weight  2 lbs.    Hamstring Curl  Right;20 reps    Hamstring Limitations  L2 orange band    Sit to Sand  without UE support;15 reps      Knee/Hip Exercises: Supine  Quad Sets  Right;10 reps    Short Arc [employer]  Right;10 reps    Straight Leg Raises  Right;10 reps      Manual Therapy   Manual therapy comments  Rt knee PROM to tolerance flexion and extension, flexion and extension mobs grade 2-3               PT Short Term Goals - 03/20/19 1140      PT SHORT TERM GOAL #1   Title  she will be indpendent with initial HEP    Time  4    Period  Weeks    Status  New    Target Date  04/17/19      PT SHORT TERM GOAL #2   Title  She will report less than 5/10 overall pain    Baseline  7    Time  4    Period  Weeks    Status  New      PT SHORT TERM GOAL #3   Title  WIll improve knee ROM 10-100 to improve function.    Status  New        PT Long Term Goals - 03/20/19 1154      PT LONG TERM GOAL #1   Title  She will be indpendent with all HEP issued. (target for all goals is 05/15/18)    Time  8    Period  Weeks    Status  New      PT LONG TERM GOAL #2   Title  Will reduce pain to overall less than 3/10 with ususal activity.      PT LONG TERM GOAL #3   Title  Pt will be able to ambulate community distances no AD WFL gait pattern and be able to  negotiate one flight of stairs reciprocally.    Time  8    Period  Weeks    Status  New      PT LONG TERM GOAL #4   Title  Pt will improve Rt knee AROM 3-115 to improve function.    Time  8    Period  Weeks    Status  New      PT LONG TERM GOAL #5   Title  Pt will increased Rt knee strength to 5/5 and Rt hip strength to 4+/5 MMT to improve function    Status  New            Plan - 04/09/19 1930    Clinical Impression Statement  Is progressing well with PT but continues to lack knee ROM, session focused on ROM and strength progression without complaints. Continue POC    Personal Factors and Comorbidities  Comorbidity 3+    Comorbidities  PMH: Rt TKA 03/04/19, Ca,lumbar DDD, osteopenia,    Examination-Activity Limitations  Squat;Stairs;Stand;Lift;Dressing;Locomotion Level;Transfers    Examination-Participation Restrictions  Cleaning;Meal Prep;Community Activity;Driving;Laundry;Shop    Stability/Clinical Decision Making  Evolving/Moderate complexity    Rehab Potential  Good    PT Frequency  3x / week    PT Duration  8 weeks    PT Treatment/Interventions  ADLs/Self Care Home Management;Cryotherapy;Moist Heat;Gait training;Stair training;Therapeutic activities;Therapeutic exercise;Balance training;Neuromuscular re-education;Manual techniques;Passive range of motion;Dry needling;Joint Manipulations;Taping;Vasopneumatic Device    PT Next Visit Plan  needs ROM, LE strength, gait progression off of AD as able, stairs when able, vaso for swelling and Estim for pain PRN    PT Home Exercise Plan  Access Code: [facility]    Consulted and Agree  with Plan of Care  Patient       Patient will benefit from skilled therapeutic intervention in order to improve the following deficits and impairments:  Abnormal gait, Decreased activity tolerance, Decreased balance, Decreased mobility, Decreased endurance, Decreased range of motion, Decreased strength, Increased edema, Difficulty walking, Increased  fascial restricitons, Increased muscle spasms, Impaired flexibility, Pain  Visit Diagnosis: Acute pain of right knee  Stiffness of right knee, not elsewhere classified  Muscle weakness (generalized)  Other abnormalities of gait and mobility  Localized edema     Problem List Patient Active Problem List   Diagnosis Date Noted  . Status post total hip replacement, right 03/05/2019  . Arthritis of right knee 03/04/2019  . Unilateral primary osteoarthritis, right knee 02/05/2019  . Knee locking, right 01/09/2019  . Metatarsal stress fracture, right, initial encounter 10/03/2016  . DJD (degenerative joint disease) 06/09/2016  . Labile hypertension 08/01/2014  . Vitamin D deficiency 08/01/2014  . Medication management 08/01/2014  . Obesity 07/04/2014  . Hyperlipemia 06/27/2013  . Attention deficit disorder (ADD)   . GERD (gastroesophageal reflux disease)   . Allergy   . Depression, major, in remission ([place])   . Anemia   . Ocular rosacea   . HSV infection   . Osteopenia     Silvestre Mesi 04/09/2019, 7:31 PM  [facility] Physical Therapy [address] [place], [place], [address] Phone: [phone]   Fax:  [phone]  Name: JENNAFER BODINE MRN: [id number] Date of Birth: Jun 10, 1945

## 2019-04-09 NOTE — Care Plan (Signed)
RNCM met with patient during end of her therapy visit today. She reports she is doing well overall. Flexion is improving. She is S/P 35 days from her Right TKA with Dr. Lorin Mercy. She is very pleased with progress. Patient satisfaction survey completed.

## 2019-04-09 NOTE — Telephone Encounter (Signed)
30 day ortho bundle call completed.

## 2019-04-10 ENCOUNTER — Encounter: Payer: Self-pay | Admitting: Physical Therapy

## 2019-04-10 ENCOUNTER — Ambulatory Visit: Payer: Medicare Other | Admitting: Physical Therapy

## 2019-04-10 DIAGNOSIS — M6281 Muscle weakness (generalized): Secondary | ICD-10-CM | POA: Diagnosis not present

## 2019-04-10 DIAGNOSIS — R2689 Other abnormalities of gait and mobility: Secondary | ICD-10-CM

## 2019-04-10 DIAGNOSIS — M25561 Pain in right knee: Secondary | ICD-10-CM

## 2019-04-10 DIAGNOSIS — M25661 Stiffness of right knee, not elsewhere classified: Secondary | ICD-10-CM | POA: Diagnosis not present

## 2019-04-10 DIAGNOSIS — R600 Localized edema: Secondary | ICD-10-CM

## 2019-04-10 NOTE — Therapy (Signed)
[facility] Physical Therapy [address] [place], [place], [address] Phone: [phone]   Fax:  [phone]  Physical Therapy Treatment  Patient Details  Name: Kathy Cook MRN: [id number] Date of Birth: 10-07-1945 Referring Provider (PT): [name], MD   Encounter Date: 04/10/2019  PT End of Session - 04/10/19 1231    Visit Number  6    Number of Visits  18    Date for PT Re-Evaluation  05/15/19    Authorization Type  UHC MCR    PT Start Time  [id number]    PT Stop Time  1230    PT Time Calculation (min)  45 min    Activity Tolerance  Patient tolerated treatment well    Behavior During Therapy  [facility] for tasks assessed/performed       Past Medical History:  Diagnosis Date  . Allergy   . Anxiety   . Attention deficit disorder (ADD)   . Cancer [facility])    ? basal/squam cell on chest, and forehead  . Depression   . Dry eyes   . GERD (gastroesophageal reflux disease)   . HSV infection   . Lumbar degenerative disc disease   . Ocular rosacea   . Osteopenia     Past Surgical History:  Procedure Laterality Date  . SKIN BIOPSY     ?basal/squam cell on chest  . TONSILLECTOMY AND ADENOIDECTOMY    . TOTAL KNEE ARTHROPLASTY Right 03/04/2019   Procedure: RIGHT TOTAL KNEE ARTHROPLASTY;  Surgeon: [name], MD;  Location: [place];  Service: Orthopedics;  Laterality: Right;    There were no vitals filed for this visit.  Subjective Assessment - 04/10/19 1151    Subjective  Rt knee pain down to a 1.5 out of 10 today    Pertinent History  PMH: Rt TKA 03/04/19, Ca,lumbar DDD, osteopenia,    How long can you sit comfortably?  20    How long can you stand comfortably?  5 min    Patient Stated Goals  get the pain down and get back to normsl    Pain Onset  More than a month ago        [facility] Adult PT Treatment/Exercise - 04/10/19 0001      Ambulation/Gait   Gait Comments  200 ft no AD but lacks knee ROM with gait      Knee/Hip Exercises: Stretches   Passive  Hamstring Stretch  Right;3 reps;30 seconds    Quad Stretch  Right;3 reps;30 seconds    Quad Stretch Limitations  passive in prone    Knee: Self-Stretch Limitations  lunge stretch 10 sec X 10 reps    Other Knee/Hip Stretches  prone hang for extension 1 min X 3 with STM      Knee/Hip Exercises: Aerobic   Nustep  L5 X 9 min for knee ROM      Knee/Hip Exercises: Standing   Forward Step Up  Right;10 reps;Hand Hold: 2;Step Height: 6"    Other Standing Knee Exercises  lateral walking and march walking in bars up/down X 3 reps ea      Knee/Hip Exercises: Seated   Long Arc Quad  Right;2 sets;10 reps    Long Arc Quad Weight  3 lbs.    Hamstring Curl  Right;20 reps    Hamstring Limitations  L3 band    Sit to Sand  without UE support;10 reps      Knee/Hip Exercises: Supine   Quad Sets  Right;15 reps  Quad Sets Limitations  with heel prop    Short Arc [employer]  Right;20 reps    Straight Leg Raises  Right;15 reps      Manual Therapy   Manual therapy comments  Rt knee PROM to tolerance flexion and extension, flexion and extension mobs grade 2-3, passive hamstring and quad stretching, STM to H.S. gastroc in prone hang               PT Short Term Goals - 03/20/19 1140      PT SHORT TERM GOAL #1   Title  she will be indpendent with initial HEP    Time  4    Period  Weeks    Status  New    Target Date  04/17/19      PT SHORT TERM GOAL #2   Title  She will report less than 5/10 overall pain    Baseline  7    Time  4    Period  Weeks    Status  New      PT SHORT TERM GOAL #3   Title  WIll improve knee ROM 10-100 to improve function.    Status  New        PT Long Term Goals - 03/20/19 1154      PT LONG TERM GOAL #1   Title  She will be indpendent with all HEP issued. (target for all goals is 05/15/18)    Time  8    Period  Weeks    Status  New      PT LONG TERM GOAL #2   Title  Will reduce pain to overall less than 3/10 with ususal activity.      PT LONG TERM GOAL  #3   Title  Pt will be able to ambulate community distances no AD WFL gait pattern and be able to negotiate one flight of stairs reciprocally.    Time  8    Period  Weeks    Status  New      PT LONG TERM GOAL #4   Title  Pt will improve Rt knee AROM 3-115 to improve function.    Time  8    Period  Weeks    Status  New      PT LONG TERM GOAL #5   Title  Pt will increased Rt knee strength to 5/5 and Rt hip strength to 4+/5 MMT to improve function    Status  New            Plan - 04/10/19 1232    Clinical Impression Statement  Progressing well with strength but slower progress with ROM. Continue to focus on ROM as tolerated.    Personal Factors and Comorbidities  Comorbidity 3+    Comorbidities  PMH: Rt TKA 03/04/19, Ca,lumbar DDD, osteopenia,    Examination-Activity Limitations  Squat;Stairs;Stand;Lift;Dressing;Locomotion Level;Transfers    Examination-Participation Restrictions  Cleaning;Meal Prep;Community Activity;Driving;Laundry;Shop    Stability/Clinical Decision Making  Evolving/Moderate complexity    Rehab Potential  Good    PT Frequency  3x / week    PT Duration  8 weeks    PT Treatment/Interventions  ADLs/Self Care Home Management;Cryotherapy;Moist Heat;Gait training;Stair training;Therapeutic activities;Therapeutic exercise;Balance training;Neuromuscular re-education;Manual techniques;Passive range of motion;Dry needling;Joint Manipulations;Taping;Vasopneumatic Device    PT Next Visit Plan  needs ROM, LE strength, gait progression off of AD as able, stairs when able, vaso for swelling and Estim for pain PRN    PT Home Exercise Plan  Access  Code: [facility]    Consulted and Agree with Plan of Care  Patient       Patient will benefit from skilled therapeutic intervention in order to improve the following deficits and impairments:  Abnormal gait, Decreased activity tolerance, Decreased balance, Decreased mobility, Decreased endurance, Decreased range of motion, Decreased  strength, Increased edema, Difficulty walking, Increased fascial restricitons, Increased muscle spasms, Impaired flexibility, Pain  Visit Diagnosis: Acute pain of right knee  Stiffness of right knee, not elsewhere classified  Muscle weakness (generalized)  Other abnormalities of gait and mobility  Localized edema     Problem List Patient Active Problem List   Diagnosis Date Noted  . Status post total hip replacement, right 03/05/2019  . Arthritis of right knee 03/04/2019  . Unilateral primary osteoarthritis, right knee 02/05/2019  . Knee locking, right 01/09/2019  . Metatarsal stress fracture, right, initial encounter 10/03/2016  . DJD (degenerative joint disease) 06/09/2016  . Labile hypertension 08/01/2014  . Vitamin D deficiency 08/01/2014  . Medication management 08/01/2014  . Obesity 07/04/2014  . Hyperlipemia 06/27/2013  . Attention deficit disorder (ADD)   . GERD (gastroesophageal reflux disease)   . Allergy   . Depression, major, in remission ([place])   . Anemia   . Ocular rosacea   . HSV infection   . Osteopenia     Silvestre Mesi 04/10/2019, 12:33 PM  [facility] Physical Therapy [address] [place], [place], [address] Phone: [phone]   Fax:  [phone]  Name: Kathy Cook MRN: [id number] Date of Birth: [birth year]

## 2019-04-12 ENCOUNTER — Other Ambulatory Visit: Payer: Self-pay

## 2019-04-12 ENCOUNTER — Ambulatory Visit (INDEPENDENT_AMBULATORY_CARE_PROVIDER_SITE_OTHER): Payer: Medicare Other | Admitting: Physical Therapy

## 2019-04-12 DIAGNOSIS — M6281 Muscle weakness (generalized): Secondary | ICD-10-CM | POA: Diagnosis not present

## 2019-04-12 DIAGNOSIS — R2689 Other abnormalities of gait and mobility: Secondary | ICD-10-CM

## 2019-04-12 DIAGNOSIS — R600 Localized edema: Secondary | ICD-10-CM

## 2019-04-12 DIAGNOSIS — M25661 Stiffness of right knee, not elsewhere classified: Secondary | ICD-10-CM

## 2019-04-12 DIAGNOSIS — M25561 Pain in right knee: Secondary | ICD-10-CM

## 2019-04-12 NOTE — Therapy (Signed)
[facility] Physical Therapy [address] [place], [place], [address] Phone: [phone]   Fax:  [phone]  Physical Therapy Treatment  Patient Details  Name: Kathy Cook MRN: [id number] Date of Birth: 10/24/1945 Referring Provider (PT): [name], MD   Encounter Date: 04/12/2019  PT End of Session - 04/12/19 1330    Visit Number  7    Number of Visits  18    Date for PT Re-Evaluation  05/15/19    Authorization Type  UHC MCR    PT Start Time  1100    PT Stop Time  1145    PT Time Calculation (min)  45 min    Activity Tolerance  Patient tolerated treatment well    Behavior During Therapy  [facility] for tasks assessed/performed       Past Medical History:  Diagnosis Date  . Allergy   . Anxiety   . Attention deficit disorder (ADD)   . Cancer [facility])    ? basal/squam cell on chest, and forehead  . Depression   . Dry eyes   . GERD (gastroesophageal reflux disease)   . HSV infection   . Lumbar degenerative disc disease   . Ocular rosacea   . Osteopenia     Past Surgical History:  Procedure Laterality Date  . SKIN BIOPSY     ?basal/squam cell on chest  . TONSILLECTOMY AND ADENOIDECTOMY    . TOTAL KNEE ARTHROPLASTY Right 03/04/2019   Procedure: RIGHT TOTAL KNEE ARTHROPLASTY;  Surgeon: [name], MD;  Location: [place];  Service: Orthopedics;  Laterality: Right;    There were no vitals filed for this visit.  Subjective Assessment - 04/12/19 1134    Subjective  Rt knee pain 2/10 but having more sweling today, I measured it at 2 inches bigger than the other side. I do ice and elevate it    Pertinent History  PMH: Rt TKA 03/04/19, Ca,lumbar DDD, osteopenia,    How long can you sit comfortably?  20    How long can you stand comfortably?  5 min    Patient Stated Goals  get the pain down and get back to normsl    Pain Onset  More than a month ago         [facility] PT Assessment - 04/12/19 0001      Assessment   Medical Diagnosis  Rt TKA     Referring Provider (PT)  [name], MD    Onset Date/Surgical Date  03/04/19      AROM   Right Knee Extension  -7    Right Knee Flexion  95                   [facility] Adult PT Treatment/Exercise - 04/12/19 0001      Ambulation/Gait   Gait Comments  200 ft no AD but lacks knee ROM with gait      Knee/Hip Exercises: Stretches   Passive Hamstring Stretch  Right;3 reps;30 seconds    Quad Stretch  Right;3 reps;30 seconds    Quad Stretch Limitations  passive in prone    Other Knee/Hip Stretches  prone lying for knee extension X 5 min with STM/IASTM    Other Knee/Hip Stretches  seated AAROM heelslides 10 sec X 10 reps      Knee/Hip Exercises: Aerobic   Nustep  L5 X 9 min for knee ROM      Vasopneumatic   Number Minutes Vasopneumatic   10 minutes  Vasopnuematic Location   Knee    Vasopneumatic Pressure  Medium    Vasopneumatic Temperature   34      Manual Therapy   Manual therapy comments  Rt knee PROM to tolerance flexion and extension, flexion and extension mobs grade 2-3, passive hamstring and quad stretching, STM/IASTM to H.S. gastroc in prone hang             PT Education - 04/12/19 1330    Education Details  recommended compression sleeve for knee edema    Person(s) Educated  Patient    Methods  Explanation    Comprehension  Verbalized understanding       PT Short Term Goals - 03/20/19 1140      PT SHORT TERM GOAL #1   Title  she will be indpendent with initial HEP    Time  4    Period  Weeks    Status  New    Target Date  04/17/19      PT SHORT TERM GOAL #2   Title  She will report less than 5/10 overall pain    Baseline  7    Time  4    Period  Weeks    Status  New      PT SHORT TERM GOAL #3   Title  WIll improve knee ROM 10-100 to improve function.    Status  New        PT Long Term Goals - 03/20/19 1154      PT LONG TERM GOAL #1   Title  She will be indpendent with all HEP issued. (target for all goals is 05/15/18)    Time  8     Period  Weeks    Status  New      PT LONG TERM GOAL #2   Title  Will reduce pain to overall less than 3/10 with ususal activity.      PT LONG TERM GOAL #3   Title  Pt will be able to ambulate community distances no AD WFL gait pattern and be able to negotiate one flight of stairs reciprocally.    Time  8    Period  Weeks    Status  New      PT LONG TERM GOAL #4   Title  Pt will improve Rt knee AROM 3-115 to improve function.    Time  8    Period  Weeks    Status  New      PT LONG TERM GOAL #5   Title  Pt will increased Rt knee strength to 5/5 and Rt hip strength to 4+/5 MMT to improve function    Status  New            Plan - 04/12/19 1330    Clinical Impression Statement  Due to edema today some exercises held and focused more on stretching and edema control with vasopnuematic and compression sleeve. Monitor edema next visit and progress back strengthening as edema calms down    Personal Factors and Comorbidities  Comorbidity 3+    Comorbidities  PMH: Rt TKA 03/04/19, Ca,lumbar DDD, osteopenia,    Examination-Activity Limitations  Squat;Stairs;Stand;Lift;Dressing;Locomotion Level;Transfers    Examination-Participation Restrictions  Cleaning;Meal Prep;Community Activity;Driving;Laundry;Shop    Stability/Clinical Decision Making  Evolving/Moderate complexity    Rehab Potential  Good    PT Frequency  3x / week    PT Duration  8 weeks    PT Treatment/Interventions  ADLs/Self Care Home Management;Cryotherapy;Moist Heat;Gait training;Stair  training;Therapeutic activities;Therapeutic exercise;Balance training;Neuromuscular re-education;Manual techniques;Passive range of motion;Dry needling;Joint Manipulations;Taping;Vasopneumatic Device    PT Next Visit Plan  needs ROM, LE strength, gait progression off of AD as able, stairs when able, vaso for swelling and Estim for pain PRN    PT Home Exercise Plan  Access Code: [facility]    Consulted and Agree with Plan of Care  Patient        Patient will benefit from skilled therapeutic intervention in order to improve the following deficits and impairments:  Abnormal gait, Decreased activity tolerance, Decreased balance, Decreased mobility, Decreased endurance, Decreased range of motion, Decreased strength, Increased edema, Difficulty walking, Increased fascial restricitons, Increased muscle spasms, Impaired flexibility, Pain  Visit Diagnosis: Acute pain of right knee  Stiffness of right knee, not elsewhere classified  Muscle weakness (generalized)  Other abnormalities of gait and mobility  Localized edema     Problem List Patient Active Problem List   Diagnosis Date Noted  . Status post total hip replacement, right 03/05/2019  . Arthritis of right knee 03/04/2019  . Unilateral primary osteoarthritis, right knee 02/05/2019  . Knee locking, right 01/09/2019  . Metatarsal stress fracture, right, initial encounter 10/03/2016  . DJD (degenerative joint disease) 06/09/2016  . Labile hypertension 08/01/2014  . Vitamin D deficiency 08/01/2014  . Medication management 08/01/2014  . Obesity 07/04/2014  . Hyperlipemia 06/27/2013  . Attention deficit disorder (ADD)   . GERD (gastroesophageal reflux disease)   . Allergy   . Depression, major, in remission ([place])   . Anemia   . Ocular rosacea   . HSV infection   . Osteopenia     Kathy Cook 04/12/2019, 1:45 PM  [facility] Physical Therapy [address] [place], [place], [address] Phone: [phone]   Fax:  [phone]  Name: Kathy Cook MRN: [id number] Date of Birth: 1945/04/26

## 2019-04-15 ENCOUNTER — Other Ambulatory Visit: Payer: Self-pay

## 2019-04-15 ENCOUNTER — Ambulatory Visit (INDEPENDENT_AMBULATORY_CARE_PROVIDER_SITE_OTHER): Payer: Medicare Other | Admitting: Physical Therapy

## 2019-04-15 DIAGNOSIS — R600 Localized edema: Secondary | ICD-10-CM

## 2019-04-15 DIAGNOSIS — M6281 Muscle weakness (generalized): Secondary | ICD-10-CM | POA: Diagnosis not present

## 2019-04-15 DIAGNOSIS — M25661 Stiffness of right knee, not elsewhere classified: Secondary | ICD-10-CM | POA: Diagnosis not present

## 2019-04-15 DIAGNOSIS — R2689 Other abnormalities of gait and mobility: Secondary | ICD-10-CM

## 2019-04-15 DIAGNOSIS — M25561 Pain in right knee: Secondary | ICD-10-CM

## 2019-04-15 NOTE — Therapy (Signed)
[facility] Physical Therapy [address] [place], [place], [address] Phone: [phone]   Fax:  [phone]  Physical Therapy Treatment  Patient Details  Name: Kathy Cook MRN: [id number] Date of Birth: 08/13/1945 Referring Provider (PT): [name], MD   Encounter Date: 04/15/2019  PT End of Session - 04/15/19 1330    Visit Number  8    Number of Visits  18    Date for PT Re-Evaluation  05/15/19    Authorization Type  UHC MCR    PT Start Time  1150    PT Stop Time  1230    PT Time Calculation (min)  40 min    Activity Tolerance  Patient tolerated treatment well    Behavior During Therapy  [facility] for tasks assessed/performed       Past Medical History:  Diagnosis Date  . Allergy   . Anxiety   . Attention deficit disorder (ADD)   . Cancer [facility])    ? basal/squam cell on chest, and forehead  . Depression   . Dry eyes   . GERD (gastroesophageal reflux disease)   . HSV infection   . Lumbar degenerative disc disease   . Ocular rosacea   . Osteopenia     Past Surgical History:  Procedure Laterality Date  . SKIN BIOPSY     ?basal/squam cell on chest  . TONSILLECTOMY AND ADENOIDECTOMY    . TOTAL KNEE ARTHROPLASTY Right 03/04/2019   Procedure: RIGHT TOTAL KNEE ARTHROPLASTY;  Surgeon: [name], MD;  Location: [place];  Service: Orthopedics;  Laterality: Right;    There were no vitals filed for this visit.  Subjective Assessment - 04/15/19 1329    Subjective  Not having a good day with her Rt knee, it is tight and swollen. 5/10 pain    Pertinent History  PMH: Rt TKA 03/04/19, Ca,lumbar DDD, osteopenia,    How long can you sit comfortably?  20    How long can you stand comfortably?  5 min    Patient Stated Goals  get the pain down and get back to normsl    Pain Onset  More than a month ago       [facility] Adult PT Treatment/Exercise - 04/15/19 0001      Ambulation/Gait   Gait Comments  200 ft no AD but lacks knee ROM with gait      Knee/Hip  Exercises: Stretches   Passive Hamstring Stretch  Right;3 reps;30 seconds    Quad Stretch  Right;3 reps;30 seconds    Quad Stretch Limitations  passive in prone    Press photographer  Both;3 reps;30 seconds    Gastroc Stretch Limitations  slantbboard    Other Knee/Hip Stretches  prone lying for knee extension X 5 min with STM/IASTM    Other Knee/Hip Stretches  seated AAROM heelslides 10 sec X 10 reps      Knee/Hip Exercises: Aerobic   Nustep  L5 X 9 min for knee ROM      Knee/Hip Exercises: Standing   Knee Flexion  Right;15 reps    Forward Step Up  Right;15 reps;Hand Hold: 1;Step Height: 6"    Other Standing Knee Exercises  march walking and retro walking up/down X 3 reps      Knee/Hip Exercises: Seated   Long Arc Quad  Right;2 sets;10 reps    Long Arc Quad Weight  3 lbs.      Knee/Hip Exercises: Supine   Quad Sets  Right;15 reps  Short Product manager reps    Straight Leg Raises  Right;15 reps      Manual Therapy   Manual therapy comments  Rt knee PROM to tolerance flexion and extension, flexion and extension mobs grade 2-3, passive hamstring and quad stretching, STM/IASTM to H.S. gastroc in prone hang               PT Short Term Goals - 03/20/19 1140      PT SHORT TERM GOAL #1   Title  she will be indpendent with initial HEP    Time  4    Period  Weeks    Status  New    Target Date  04/17/19      PT SHORT TERM GOAL #2   Title  She will report less than 5/10 overall pain    Baseline  7    Time  4    Period  Weeks    Status  New      PT SHORT TERM GOAL #3   Title  WIll improve knee ROM 10-100 to improve function.    Status  New        PT Long Term Goals - 03/20/19 1154      PT LONG TERM GOAL #1   Title  She will be indpendent with all HEP issued. (target for all goals is 05/15/18)    Time  8    Period  Weeks    Status  New      PT LONG TERM GOAL #2   Title  Will reduce pain to overall less than 3/10 with ususal activity.      PT LONG TERM  GOAL #3   Title  Pt will be able to ambulate community distances no AD WFL gait pattern and be able to negotiate one flight of stairs reciprocally.    Time  8    Period  Weeks    Status  New      PT LONG TERM GOAL #4   Title  Pt will improve Rt knee AROM 3-115 to improve function.    Time  8    Period  Weeks    Status  New      PT LONG TERM GOAL #5   Title  Pt will increased Rt knee strength to 5/5 and Rt hip strength to 4+/5 MMT to improve function    Status  New            Plan - 04/15/19 1331    Clinical Impression Statement  Session continued to focus on ROM as this continues to be her biggest deficit. She is progressing slowly with ROM but progressing much better with strength, ambulation, and standing activity tolerance. Continue POC with ROM emphasis.    Personal Factors and Comorbidities  Comorbidity 3+    Comorbidities  PMH: Rt TKA 03/04/19, Ca,lumbar DDD, osteopenia,    Examination-Activity Limitations  Squat;Stairs;Stand;Lift;Dressing;Locomotion Level;Transfers    Examination-Participation Restrictions  Cleaning;Meal Prep;Community Activity;Driving;Laundry;Shop    Stability/Clinical Decision Making  Evolving/Moderate complexity    Rehab Potential  Good    PT Frequency  3x / week    PT Duration  8 weeks    PT Treatment/Interventions  ADLs/Self Care Home Management;Cryotherapy;Moist Heat;Gait training;Stair training;Therapeutic activities;Therapeutic exercise;Balance training;Neuromuscular re-education;Manual techniques;Passive range of motion;Dry needling;Joint Manipulations;Taping;Vasopneumatic Device    PT Next Visit Plan  needs ROM, LE strength, gait progression off of AD as able, stairs when able, vaso for swelling and Estim for pain  PRN    PT Home Exercise Plan  Access Code: [facility]    Consulted and Agree with Plan of Care  Patient       Patient will benefit from skilled therapeutic intervention in order to improve the following deficits and impairments:   Abnormal gait, Decreased activity tolerance, Decreased balance, Decreased mobility, Decreased endurance, Decreased range of motion, Decreased strength, Increased edema, Difficulty walking, Increased fascial restricitons, Increased muscle spasms, Impaired flexibility, Pain  Visit Diagnosis: Acute pain of right knee  Stiffness of right knee, not elsewhere classified  Other abnormalities of gait and mobility  Muscle weakness (generalized)  Localized edema     Problem List Patient Active Problem List   Diagnosis Date Noted  . Status post total hip replacement, right 03/05/2019  . Arthritis of right knee 03/04/2019  . Unilateral primary osteoarthritis, right knee 02/05/2019  . Knee locking, right 01/09/2019  . Metatarsal stress fracture, right, initial encounter 10/03/2016  . DJD (degenerative joint disease) 06/09/2016  . Labile hypertension 08/01/2014  . Vitamin D deficiency 08/01/2014  . Medication management 08/01/2014  . Obesity 07/04/2014  . Hyperlipemia 06/27/2013  . Attention deficit disorder (ADD)   . GERD (gastroesophageal reflux disease)   . Allergy   . Depression, major, in remission ([place])   . Anemia   . Ocular rosacea   . HSV infection   . Osteopenia     Silvestre Mesi 04/15/2019, 1:38 PM  [facility] Physical Therapy [address] [place], [place], [address] Phone: [phone]   Fax:  [phone]  Name: TANAJA YARISH MRN: [id number] Date of Birth: 06-28-1945

## 2019-04-17 ENCOUNTER — Ambulatory Visit (INDEPENDENT_AMBULATORY_CARE_PROVIDER_SITE_OTHER): Payer: Medicare Other | Admitting: Physical Therapy

## 2019-04-17 ENCOUNTER — Other Ambulatory Visit: Payer: Self-pay

## 2019-04-17 DIAGNOSIS — M25661 Stiffness of right knee, not elsewhere classified: Secondary | ICD-10-CM

## 2019-04-17 DIAGNOSIS — M6281 Muscle weakness (generalized): Secondary | ICD-10-CM

## 2019-04-17 DIAGNOSIS — R2689 Other abnormalities of gait and mobility: Secondary | ICD-10-CM

## 2019-04-17 DIAGNOSIS — M25561 Pain in right knee: Secondary | ICD-10-CM

## 2019-04-17 NOTE — Therapy (Signed)
[facility] Physical Therapy [address] [place], [place], [address] Phone: [phone]   Fax:  [phone]  Physical Therapy Treatment  Patient Details  Name: Kathy Cook MRN: [id number] Date of Birth: [birth year] Referring Provider (PT): [name], MD   Encounter Date: 04/17/2019  PT End of Session - 04/17/19 1315    Visit Number  9    Number of Visits  18    Date for PT Re-Evaluation  05/15/19    Authorization Type  UHC MCR    PT Start Time  [id number]    PT Stop Time  1230    PT Time Calculation (min)  45 min    Activity Tolerance  Patient tolerated treatment well    Behavior During Therapy  [facility] for tasks assessed/performed       Past Medical History:  Diagnosis Date  . Allergy   . Anxiety   . Attention deficit disorder (ADD)   . Cancer [facility])    ? basal/squam cell on chest, and forehead  . Depression   . Dry eyes   . GERD (gastroesophageal reflux disease)   . HSV infection   . Lumbar degenerative disc disease   . Ocular rosacea   . Osteopenia     Past Surgical History:  Procedure Laterality Date  . SKIN BIOPSY     ?basal/squam cell on chest  . TONSILLECTOMY AND ADENOIDECTOMY    . TOTAL KNEE ARTHROPLASTY Right 03/04/2019   Procedure: RIGHT TOTAL KNEE ARTHROPLASTY;  Surgeon: [name], MD;  Location: [facility];  Service: Orthopedics;  Laterality: Right;    There were no vitals filed for this visit.  Subjective Assessment - 04/17/19 1313    Subjective  got an ace wrap which helps with the swelling, not really having pain in my knee it just aggravates me and pulls    Pertinent History  PMH: Rt TKA 03/04/19, Ca,lumbar DDD, osteopenia,    How long can you sit comfortably?  20    How long can you stand comfortably?  5 min    Patient Stated Goals  get the pain down and get back to normsl    Pain Onset  More than a month ago                       [facility] Adult PT Treatment/Exercise - 04/17/19 0001      Ambulation/Gait   Gait  Comments  250 ft no AD but lacks knee ROM with gait      Knee/Hip Exercises: Stretches   Passive Hamstring Stretch  Right;3 reps;30 seconds    Quad Stretch  Right;3 reps;30 seconds    Quad Stretch Limitations  passive in prone    Press photographer  Both;30 seconds;2 reps    Gastroc Stretch Limitations  slantbboard    Other Knee/Hip Stretches  prone lying for knee extension X 5 min with STM/IASTM    Other Knee/Hip Stretches  seated AAROM heelslides 10 sec X 10 reps      Knee/Hip Exercises: Aerobic   Nustep  L5 X 9 min for knee ROM      Knee/Hip Exercises: Machines for Strengthening   Total Gym Leg Press  bilat push 75 lbs 2X15, then dropped to 50 lbs for Rt leg only push 2X10      Knee/Hip Exercises: Standing   Knee Flexion  --    Knee Flexion Limitations  --    Forward Step Up  Right;15 reps;Hand Hold: 1;Step  Height: 6"      Manual Therapy   Manual therapy comments  Rt knee PROM to tolerance flexion and extension, flexion and extension mobs grade 2-3, passive hamstring and quad stretching, STM/IASTM to H.S. gastroc in prone hang               PT Short Term Goals - 03/20/19 1140      PT SHORT TERM GOAL #1   Title  she will be indpendent with initial HEP    Time  4    Period  Weeks    Status  New    Target Date  04/17/19      PT SHORT TERM GOAL #2   Title  She will report less than 5/10 overall pain    Baseline  7    Time  4    Period  Weeks    Status  New      PT SHORT TERM GOAL #3   Title  WIll improve knee ROM 10-100 to improve function.    Status  New        PT Long Term Goals - 03/20/19 1154      PT LONG TERM GOAL #1   Title  She will be indpendent with all HEP issued. (target for all goals is 05/15/18)    Time  8    Period  Weeks    Status  New      PT LONG TERM GOAL #2   Title  Will reduce pain to overall less than 3/10 with ususal activity.      PT LONG TERM GOAL #3   Title  Pt will be able to ambulate community distances no AD WFL gait pattern  and be able to negotiate one flight of stairs reciprocally.    Time  8    Period  Weeks    Status  New      PT LONG TERM GOAL #4   Title  Pt will improve Rt knee AROM 3-115 to improve function.    Time  8    Period  Weeks    Status  New      PT LONG TERM GOAL #5   Title  Pt will increased Rt knee strength to 5/5 and Rt hip strength to 4+/5 MMT to improve function    Status  New            Plan - 04/17/19 1315    Clinical Impression Statement  She is making progress with ROM, strength, and gait but still does not have full ROM. PT will continue to progress this as able.    Personal Factors and Comorbidities  Comorbidity 3+    Comorbidities  PMH: Rt TKA 03/04/19, Ca,lumbar DDD, osteopenia,    Examination-Activity Limitations  Squat;Stairs;Stand;Lift;Dressing;Locomotion Level;Transfers    Examination-Participation Restrictions  Cleaning;Meal Prep;Community Activity;Driving;Laundry;Shop    Stability/Clinical Decision Making  Evolving/Moderate complexity    Rehab Potential  Good    PT Frequency  3x / week    PT Duration  8 weeks    PT Treatment/Interventions  ADLs/Self Care Home Management;Cryotherapy;Moist Heat;Gait training;Stair training;Therapeutic activities;Therapeutic exercise;Balance training;Neuromuscular re-education;Manual techniques;Passive range of motion;Dry needling;Joint Manipulations;Taping;Vasopneumatic Device    PT Next Visit Plan  needs ROM, LE strength, gait progression off of AD as able, stairs when able, vaso for swelling and Estim for pain PRN    PT Home Exercise Plan  Access Code: [facility]    Consulted and Agree with Plan of Care  Patient  Patient will benefit from skilled therapeutic intervention in order to improve the following deficits and impairments:  Abnormal gait, Decreased activity tolerance, Decreased balance, Decreased mobility, Decreased endurance, Decreased range of motion, Decreased strength, Increased edema, Difficulty walking, Increased  fascial restricitons, Increased muscle spasms, Impaired flexibility, Pain  Visit Diagnosis: Acute pain of right knee  Stiffness of right knee, not elsewhere classified  Other abnormalities of gait and mobility  Muscle weakness (generalized)     Problem List Patient Active Problem List   Diagnosis Date Noted  . Status post total hip replacement, right 03/05/2019  . Arthritis of right knee 03/04/2019  . Unilateral primary osteoarthritis, right knee 02/05/2019  . Knee locking, right 01/09/2019  . Metatarsal stress fracture, right, initial encounter 10/03/2016  . DJD (degenerative joint disease) 06/09/2016  . Labile hypertension 08/01/2014  . Vitamin D deficiency 08/01/2014  . Medication management 08/01/2014  . Obesity 07/04/2014  . Hyperlipemia 06/27/2013  . Attention deficit disorder (ADD)   . GERD (gastroesophageal reflux disease)   . Allergy   . Depression, major, in remission ([place])   . Anemia   . Ocular rosacea   . HSV infection   . Osteopenia     Silvestre Mesi 04/17/2019, 1:16 PM  [facility] Physical Therapy [address] [place], [place], [address] Phone: [phone]   Fax:  [phone]  Name: Kathy Cook MRN: [id number] Date of Birth: 1945/03/29

## 2019-04-19 ENCOUNTER — Ambulatory Visit: Payer: Medicare Other | Admitting: Physical Therapy

## 2019-04-19 ENCOUNTER — Other Ambulatory Visit: Payer: Self-pay

## 2019-04-19 DIAGNOSIS — M25661 Stiffness of right knee, not elsewhere classified: Secondary | ICD-10-CM

## 2019-04-19 DIAGNOSIS — R2689 Other abnormalities of gait and mobility: Secondary | ICD-10-CM | POA: Diagnosis not present

## 2019-04-19 DIAGNOSIS — M6281 Muscle weakness (generalized): Secondary | ICD-10-CM

## 2019-04-19 DIAGNOSIS — M25561 Pain in right knee: Secondary | ICD-10-CM | POA: Diagnosis not present

## 2019-04-19 DIAGNOSIS — R600 Localized edema: Secondary | ICD-10-CM | POA: Diagnosis not present

## 2019-04-19 NOTE — Therapy (Signed)
[facility] Physical Therapy [address] [place], [place], [address] Phone: [phone]   Fax:  [phone]  Physical Therapy Treatment/MCR progress note Progress Note reporting period 03/20/19 to 04/19/19  See below for objective and subjective measurements relating to patients progress with PT.   Patient Details  Name: Kathy Cook MRN: [id number] Date of Birth: May 20, 1945 Referring Provider (PT): [name], MD   Encounter Date: 04/19/2019  PT End of Session - 04/19/19 1423    Visit Number  10    Number of Visits  18    Date for PT Re-Evaluation  05/15/19    Authorization Type  UHC MCR    PT Start Time  4010    PT Stop Time  1240    PT Time Calculation (min)  55 min    Activity Tolerance  Patient tolerated treatment well    Behavior During Therapy  [facility] for tasks assessed/performed       Past Medical History:  Diagnosis Date  . Allergy   . Anxiety   . Attention deficit disorder (ADD)   . Cancer [facility])    ? basal/squam cell on chest, and forehead  . Depression   . Dry eyes   . GERD (gastroesophageal reflux disease)   . HSV infection   . Lumbar degenerative disc disease   . Ocular rosacea   . Osteopenia     Past Surgical History:  Procedure Laterality Date  . SKIN BIOPSY     ?basal/squam cell on chest  . TONSILLECTOMY AND ADENOIDECTOMY    . TOTAL KNEE ARTHROPLASTY Right 03/04/2019   Procedure: RIGHT TOTAL KNEE ARTHROPLASTY;  Surgeon: [name], MD;  Location: [place];  Service: Orthopedics;  Laterality: Right;    There were no vitals filed for this visit.  Subjective Assessment - 04/19/19 1419    Subjective  she relays she has been working hard at home with HEP, pain is mild to moderate in her knee. Feels she is making progress but wishes it would come along faster    Pertinent History  PMH: Rt TKA 03/04/19, Ca,lumbar DDD, osteopenia,    How long can you sit comfortably?  20    How long can you stand comfortably?  5 min    Patient  Stated Goals  get the pain down and get back to normsl    Pain Onset  More than a month ago         [facility] PT Assessment - 04/19/19 0001      Assessment   Medical Diagnosis  Rt TKA    Referring Provider (PT)  [name], MD    Onset Date/Surgical Date  03/04/19      AROM   Right Knee Extension  -5    Right Knee Flexion  95      Strength   Overall Strength Comments  Rt hip strength overall 4/5, Rt knee strength overall 4+/5                   [facility] Adult PT Treatment/Exercise - 04/19/19 0001      Knee/Hip Exercises: Stretches   Passive Hamstring Stretch  Right;3 reps;30 seconds    Passive Hamstring Stretch Limitations  passive in supine with MET  X3 reps    Quad Stretch  Right;3 reps;30 seconds    Quad Stretch Limitations  passive in prone with MET X 3 reps    Other Knee/Hip Stretches  prone lying for knee extension X 5 min with  STM/IASTM    Other Knee/Hip Stretches  supine AAROM heelslides 10 sec X 10 reps      Knee/Hip Exercises: Aerobic   Recumbent Bike  rocking for knee ROM 6 min      Knee/Hip Exercises: Machines for Strengthening   Total Gym Leg Press  bilat push 75 lbs 2X15, then dropped to 50 lbs for Rt leg only push 2X10      Knee/Hip Exercises: Standing   Knee Flexion  Right;15 reps    Knee Flexion Limitations  3    Forward Step Up  Right;15 reps;Hand Hold: 1;Step Height: 6"      Knee/Hip Exercises: Seated   Long Arc Quad  Right;2 sets;10 reps    Long Arc Quad Weight  3 lbs.      Vasopneumatic   Number Minutes Vasopneumatic   10 minutes    Vasopnuematic Location   Knee    Vasopneumatic Pressure  Medium    Vasopneumatic Temperature   34      Manual Therapy   Manual therapy comments  Rt knee PROM to tolerance flexion and extension, flexion and extension mobs grade 2-3, passive hamstring and quad stretching, STM/IASTM to H.S. gastroc in prone hang               PT Short Term Goals - 04/19/19 1426      PT SHORT TERM GOAL #1   Title   she will be indpendent with initial HEP    Time  4    Period  Weeks    Status  Achieved    Target Date  04/17/19      PT SHORT TERM GOAL #2   Title  She will report less than 5/10 overall pain    Time  4    Period  Weeks    Status  Achieved      PT SHORT TERM GOAL #3   Title  WIll improve knee ROM 10-100 to improve function.    Status  Achieved        PT Long Term Goals - 04/19/19 1426      PT LONG TERM GOAL #1   Title  She will be indpendent with all HEP issued. (target for all goals is 05/15/18)    Time  8    Period  Weeks    Status  On-going      PT LONG TERM GOAL #2   Title  Will reduce pain to overall less than 3/10 with ususal activity.    Status  On-going      PT LONG TERM GOAL #3   Title  Pt will be able to ambulate community distances no AD WFL gait pattern and be able to negotiate one flight of stairs reciprocally.    Time  8    Period  Weeks    Status  New      PT LONG TERM GOAL #4   Title  Pt will improve Rt knee AROM 3-115 to improve function.    Time  8    Period  Weeks    Status  On-going      PT LONG TERM GOAL #5   Title  Pt will increased Rt knee strength to 5/5 and Rt hip strength to 4+/5 MMT to improve function    Status  On-going            Plan - 04/19/19 1424    Clinical Impression Statement  She continues to progress well with ambulation, strength,  and gait. She is making slower but steady progress with ROM and sessions continue to focus on aggressive stretching as tolerated. She has now met all of her short term goals, She will continue to benefit from PT to address her ROM deficits and reach her functional long term goals.    Personal Factors and Comorbidities  Comorbidity 3+    Comorbidities  PMH: Rt TKA 03/04/19, Ca,lumbar DDD, osteopenia,    Examination-Activity Limitations  Squat;Stairs;Stand;Lift;Dressing;Locomotion Level;Transfers    Examination-Participation Restrictions  Cleaning;Meal Prep;Community Activity;Driving;Laundry;Shop     Stability/Clinical Decision Making  Evolving/Moderate complexity    Rehab Potential  Good    PT Frequency  3x / week    PT Duration  8 weeks    PT Treatment/Interventions  ADLs/Self Care Home Management;Cryotherapy;Moist Heat;Gait training;Stair training;Therapeutic activities;Therapeutic exercise;Balance training;Neuromuscular re-education;Manual techniques;Passive range of motion;Dry needling;Joint Manipulations;Taping;Vasopneumatic Device    PT Next Visit Plan  needs ROM, LE strength, gait progression off of AD as able, stairs when able, vaso for swelling and Estim for pain PRN    PT Home Exercise Plan  Access Code: [facility]    Consulted and Agree with Plan of Care  Patient       Patient will benefit from skilled therapeutic intervention in order to improve the following deficits and impairments:  Abnormal gait, Decreased activity tolerance, Decreased balance, Decreased mobility, Decreased endurance, Decreased range of motion, Decreased strength, Increased edema, Difficulty walking, Increased fascial restricitons, Increased muscle spasms, Impaired flexibility, Pain  Visit Diagnosis: Acute pain of right knee  Stiffness of right knee, not elsewhere classified  Other abnormalities of gait and mobility  Muscle weakness (generalized)  Localized edema     Problem List Patient Active Problem List   Diagnosis Date Noted  . Status post total hip replacement, right 03/05/2019  . Arthritis of right knee 03/04/2019  . Unilateral primary osteoarthritis, right knee 02/05/2019  . Knee locking, right 01/09/2019  . Metatarsal stress fracture, right, initial encounter 10/03/2016  . DJD (degenerative joint disease) 06/09/2016  . Labile hypertension 08/01/2014  . Vitamin D deficiency 08/01/2014  . Medication management 08/01/2014  . Obesity 07/04/2014  . Hyperlipemia 06/27/2013  . Attention deficit disorder (ADD)   . GERD (gastroesophageal reflux disease)   . Allergy   . Depression,  major, in remission ([place])   . Anemia   . Ocular rosacea   . HSV infection   . Osteopenia     Silvestre Mesi 04/19/2019, 2:28 PM  [facility] Physical Therapy [address] [place], [place], [address] Phone: [phone]   Fax:  [phone]  Name: KANON COLUNGA MRN: [id number] Date of Birth: [birth year]

## 2019-04-22 ENCOUNTER — Ambulatory Visit (INDEPENDENT_AMBULATORY_CARE_PROVIDER_SITE_OTHER): Payer: Medicare Other | Admitting: Physical Therapy

## 2019-04-22 ENCOUNTER — Ambulatory Visit: Payer: Medicare Other

## 2019-04-22 ENCOUNTER — Other Ambulatory Visit: Payer: Self-pay

## 2019-04-22 DIAGNOSIS — M25661 Stiffness of right knee, not elsewhere classified: Secondary | ICD-10-CM

## 2019-04-22 DIAGNOSIS — M25561 Pain in right knee: Secondary | ICD-10-CM | POA: Diagnosis not present

## 2019-04-22 DIAGNOSIS — R2689 Other abnormalities of gait and mobility: Secondary | ICD-10-CM

## 2019-04-22 DIAGNOSIS — R600 Localized edema: Secondary | ICD-10-CM | POA: Diagnosis not present

## 2019-04-22 DIAGNOSIS — M6281 Muscle weakness (generalized): Secondary | ICD-10-CM

## 2019-04-22 NOTE — Therapy (Signed)
[facility] Physical Therapy [address] [place], [place], [address] Phone: [phone]   Fax:  [phone]  Physical Therapy Treatment  Patient Details  Name: Kathy Cook MRN: [id number] Date of Birth: 1945-11-29 Referring Provider (PT): [name], MD   Encounter Date: 04/22/2019  PT End of Session - 04/22/19 1234    Visit Number  11    Number of Visits  18    Date for PT Re-Evaluation  05/15/19    Authorization Type  [employer]HC MCR    PT Start Time  2355    PT Stop Time  1230    PT Time Calculation (min)  45 min    Activity Tolerance  Patient tolerated treatment well    Behavior During Therapy  [facility] for tasks assessed/performed       Past Medical History:  Diagnosis Date  . Allergy   . Anxiety   . Attention deficit disorder (ADD)   . Cancer [facility])    ? basal/squam cell on chest, and forehead  . Depression   . Dry eyes   . GERD (gastroesophageal reflux disease)   . HSV infection   . Lumbar degenerative disc disease   . Ocular rosacea   . Osteopenia     Past Surgical History:  Procedure Laterality Date  . SKIN BIOPSY     ?basal/squam cell on chest  . TONSILLECTOMY AND ADENOIDECTOMY    . TOTAL KNEE ARTHROPLASTY Right 03/04/2019   Procedure: RIGHT TOTAL KNEE ARTHROPLASTY;  Surgeon: [name], MD;  Location: [place];  Service: Orthopedics;  Laterality: Right;    There were no vitals filed for this visit.  Subjective Assessment - 04/22/19 1205    Subjective  She relays 1/10 pain in her knee that is just annoying and wakes her up    Pertinent History  PMH: Rt TKA 03/04/19, Ca,lumbar DDD, osteopenia,    Patient Stated Goals  get the pain down and get back to normsl    Pain Onset  More than a month ago                       [facility] Adult PT Treatment/Exercise - 04/22/19 0001      Knee/Hip Exercises: Stretches   Passive Hamstring Stretch  Right;3 reps;30 seconds    Passive Hamstring Stretch Limitations  passive in supine with  MET  X3 reps    Quad Stretch  Right;3 reps;30 seconds    Quad Stretch Limitations  passive in prone with MET X 3 reps    Other Knee/Hip Stretches  prone lying for knee extension X 5 min with STM/IASTM    Other Knee/Hip Stretches  supine AAROM heelslides 10 sec X 10 reps      Knee/Hip Exercises: Aerobic   Nustep  L6 X 9 min for knee ROM      Knee/Hip Exercises: Machines for Strengthening   Total Gym Leg Press  bilat push 81 lbs 2X15, then dropped to 56 lbs for Rt leg only push 2X10      Knee/Hip Exercises: Standing   Hip Abduction  Right;20 reps;Knee straight    Abduction Limitations  4 lbs      Knee/Hip Exercises: Seated   Long Arc Quad  Right;20 reps    Long Arc Quad Weight  4 lbs.      Knee/Hip Exercises: Prone   Hamstring Curl  20 reps    Hamstring Curl Limitations  2 lbs      Vasopneumatic  Number Minutes Vasopneumatic   5 minutes    Vasopnuematic Location   Knee    Vasopneumatic Pressure  Medium    Vasopneumatic Temperature   34      Manual Therapy   Manual therapy comments  Rt knee PROM to tolerance flexion and extension, flexion and extension mobs grade 2-3, passive hamstring and quad stretching, STM/IASTM to H.S. gastroc in prone hang               PT Short Term Goals - 04/19/19 1426      PT SHORT TERM GOAL #1   Title  she will be indpendent with initial HEP    Time  4    Period  Weeks    Status  Achieved    Target Date  04/17/19      PT SHORT TERM GOAL #2   Title  She will report less than 5/10 overall pain    Time  4    Period  Weeks    Status  Achieved      PT SHORT TERM GOAL #3   Title  WIll improve knee ROM 10-100 to improve function.    Status  Achieved        PT Long Term Goals - 04/19/19 1426      PT LONG TERM GOAL #1   Title  She will be indpendent with all HEP issued. (target for all goals is 05/15/18)    Time  8    Period  Weeks    Status  On-going      PT LONG TERM GOAL #2   Title  Will reduce pain to overall less than 3/10  with ususal activity.    Status  On-going      PT LONG TERM GOAL #3   Title  Pt will be able to ambulate community distances no AD WFL gait pattern and be able to negotiate one flight of stairs reciprocally.    Time  8    Period  Weeks    Status  New      PT LONG TERM GOAL #4   Title  Pt will improve Rt knee AROM 3-115 to improve function.    Time  8    Period  Weeks    Status  On-going      PT LONG TERM GOAL #5   Title  Pt will increased Rt knee strength to 5/5 and Rt hip strength to 4+/5 MMT to improve function    Status  On-going            Plan - 04/22/19 1235    Clinical Impression Statement  She is doing overall well with pain levels. Continued to focus most of session on ROM, then progressing her strength with good tolerance. Continued with Vaso post treatment for edema control.  Continue POC    Personal Factors and Comorbidities  Comorbidity 3+    Comorbidities  PMH: Rt TKA 03/04/19, Ca,lumbar DDD, osteopenia,    Examination-Activity Limitations  Squat;Stairs;Stand;Lift;Dressing;Locomotion Level;Transfers    Examination-Participation Restrictions  Cleaning;Meal Prep;Community Activity;Driving;Laundry;Shop    Stability/Clinical Decision Making  Evolving/Moderate complexity    Rehab Potential  Good    PT Frequency  3x / week    PT Duration  8 weeks    PT Treatment/Interventions  ADLs/Self Care Home Management;Cryotherapy;Moist Heat;Gait training;Stair training;Therapeutic activities;Therapeutic exercise;Balance training;Neuromuscular re-education;Manual techniques;Passive range of motion;Dry needling;Joint Manipulations;Taping;Vasopneumatic Device    PT Next Visit Plan  needs ROM, LE strength, gait progression off of AD  as able, stairs when able, vaso for swelling and Estim for pain PRN    PT Home Exercise Plan  Access Code: [facility]    Consulted and Agree with Plan of Care  Patient       Patient will benefit from skilled therapeutic intervention in order to improve the  following deficits and impairments:  Abnormal gait, Decreased activity tolerance, Decreased balance, Decreased mobility, Decreased endurance, Decreased range of motion, Decreased strength, Increased edema, Difficulty walking, Increased fascial restricitons, Increased muscle spasms, Impaired flexibility, Pain  Visit Diagnosis: Acute pain of right knee  Stiffness of right knee, not elsewhere classified  Other abnormalities of gait and mobility  Muscle weakness (generalized)  Localized edema     Problem List Patient Active Problem List   Diagnosis Date Noted  . Status post total hip replacement, right 03/05/2019  . Arthritis of right knee 03/04/2019  . Unilateral primary osteoarthritis, right knee 02/05/2019  . Knee locking, right 01/09/2019  . Metatarsal stress fracture, right, initial encounter 10/03/2016  . DJD (degenerative joint disease) 06/09/2016  . Labile hypertension 08/01/2014  . Vitamin D deficiency 08/01/2014  . Medication management 08/01/2014  . Obesity 07/04/2014  . Hyperlipemia 06/27/2013  . Attention deficit disorder (ADD)   . GERD (gastroesophageal reflux disease)   . Allergy   . Depression, major, in remission ([place])   . Anemia   . Ocular rosacea   . HSV infection   . Osteopenia     Silvestre Mesi 04/22/2019, 12:38 PM  [facility] Physical Therapy [address] [place], [place], [address] Phone: [phone]   Fax:  [phone]  Name: NAMIAH DUNNAVANT MRN: [id number] Date of Birth: 04/30/1945

## 2019-04-24 ENCOUNTER — Ambulatory Visit: Payer: Medicare Other | Admitting: Physical Therapy

## 2019-04-24 ENCOUNTER — Other Ambulatory Visit: Payer: Self-pay

## 2019-04-24 DIAGNOSIS — R600 Localized edema: Secondary | ICD-10-CM | POA: Diagnosis not present

## 2019-04-24 DIAGNOSIS — M6281 Muscle weakness (generalized): Secondary | ICD-10-CM

## 2019-04-24 DIAGNOSIS — M25661 Stiffness of right knee, not elsewhere classified: Secondary | ICD-10-CM

## 2019-04-24 DIAGNOSIS — M25561 Pain in right knee: Secondary | ICD-10-CM | POA: Diagnosis not present

## 2019-04-24 DIAGNOSIS — R2689 Other abnormalities of gait and mobility: Secondary | ICD-10-CM

## 2019-04-24 NOTE — Therapy (Signed)
[facility] Physical Therapy [address] [place], [place], [address] Phone: [phone]   Fax:  [phone]  Physical Therapy Treatment  Patient Details  Name: Kathy Cook MRN: [id number] Date of Birth: [birth year] Referring Provider (PT): [name], MD   Encounter Date: 04/24/2019  PT End of Session - 04/24/19 1243    Visit Number  12    Number of Visits  18    Date for PT Re-Evaluation  05/15/19    Authorization Type  UHC MCR    PT Start Time  5093    PT Stop Time  1230    PT Time Calculation (min)  43 min    Activity Tolerance  Patient tolerated treatment well    Behavior During Therapy  [facility] for tasks assessed/performed       Past Medical History:  Diagnosis Date  . Allergy   . Anxiety   . Attention deficit disorder (ADD)   . Cancer [facility])    ? basal/squam cell on chest, and forehead  . Depression   . Dry eyes   . GERD (gastroesophageal reflux disease)   . HSV infection   . Lumbar degenerative disc disease   . Ocular rosacea   . Osteopenia     Past Surgical History:  Procedure Laterality Date  . SKIN BIOPSY     ?basal/squam cell on chest  . TONSILLECTOMY AND ADENOIDECTOMY    . TOTAL KNEE ARTHROPLASTY Right 03/04/2019   Procedure: RIGHT TOTAL KNEE ARTHROPLASTY;  Surgeon: [name], MD;  Location: [place];  Service: Orthopedics;  Laterality: Right;    There were no vitals filed for this visit.  Subjective Assessment - 04/24/19 1229    Subjective  knee feels tight and a little achy anterioly but overall is improving and am walking now without cane    Pertinent History  PMH: Rt TKA 03/04/19, Ca,lumbar DDD, osteopenia,    How long can you sit comfortably?  20    How long can you stand comfortably?  5 min    Patient Stated Goals  get the pain down and get back to normsl    Pain Onset  More than a month ago          [facility] Adult PT Treatment/Exercise - 04/24/19 0001      Knee/Hip Exercises: Stretches   Passive Hamstring Stretch   Right;3 reps;30 seconds    Passive Hamstring Stretch Limitations  passive in supine with MET  X3 reps    Quad Stretch  Right;3 reps;30 seconds    Quad Stretch Limitations  passive in prone with MET X 3 reps    Other Knee/Hip Stretches  prone lying for knee extension X 5 min with STM/IASTM      Knee/Hip Exercises: Aerobic   Nustep  L7 X 10 min for knee ROM      Knee/Hip Exercises: Machines for Strengthening   Total Gym Leg Press  bilat push 81 lbs 2X15, then dropped to 56 lbs for Rt leg only push 2X10      Knee/Hip Exercises: Standing   Forward Step Up  Right;15 reps;Hand Hold: 1;Step Height: 6"    SLS  30 sec X 2 reps on Rt leg on airex pad      Knee/Hip Exercises: Seated   Sit to Sand  without UE support;10 reps      Manual Therapy   Manual therapy comments  Rt knee PROM to tolerance flexion and extension, flexion and extension mobs grade 2-3, passive  hamstring and quad stretching, STM/IASTM to H.S. gastroc in prone hang               PT Short Term Goals - 04/19/19 1426      PT SHORT TERM GOAL #1   Title  she will be indpendent with initial HEP    Time  4    Period  Weeks    Status  Achieved    Target Date  04/17/19      PT SHORT TERM GOAL #2   Title  She will report less than 5/10 overall pain    Time  4    Period  Weeks    Status  Achieved      PT SHORT TERM GOAL #3   Title  WIll improve knee ROM 10-100 to improve function.    Status  Achieved        PT Long Term Goals - 04/24/19 1244      PT LONG TERM GOAL #1   Title  She will be indpendent with all HEP issued. (target for all goals is 05/15/18)    Time  8    Period  Weeks    Status  On-going      PT LONG TERM GOAL #2   Title  Will reduce pain to overall less than 3/10 with ususal activity.    Status  On-going      PT LONG TERM GOAL #3   Title  Pt will be able to ambulate community distances no AD WFL gait pattern and be able to negotiate one flight of stairs reciprocally.    Time  8    Period   Weeks    Status  New      PT LONG TERM GOAL #4   Title  Pt will improve Rt knee AROM 3-115 to improve function.    Time  8    Period  Weeks    Status  On-going      PT LONG TERM GOAL #5   Title  Pt will increased Rt knee strength to 5/5 and Rt hip strength to 4+/5 MMT to improve function    Status  On-going            Plan - 04/24/19 1243    Clinical Impression Statement  continued aggressive ROM with manual therapy as this continues to be her biggest deficit. She is now ambulating without SPC with good balance and stability. Continue POC with focus on ROM    Personal Factors and Comorbidities  Comorbidity 3+    Comorbidities  PMH: Rt TKA 03/04/19, Ca,lumbar DDD, osteopenia,    Examination-Activity Limitations  Squat;Stairs;Stand;Lift;Dressing;Locomotion Level;Transfers    Examination-Participation Restrictions  Cleaning;Meal Prep;Community Activity;Driving;Laundry;Shop    Stability/Clinical Decision Making  Evolving/Moderate complexity    Rehab Potential  Good    PT Frequency  3x / week    PT Duration  8 weeks    PT Treatment/Interventions  ADLs/Self Care Home Management;Cryotherapy;Moist Heat;Gait training;Stair training;Therapeutic activities;Therapeutic exercise;Balance training;Neuromuscular re-education;Manual techniques;Passive range of motion;Dry needling;Joint Manipulations;Taping;Vasopneumatic Device    PT Next Visit Plan  needs ROM, LE strength, gait progression off of AD as able, stairs when able, vaso for swelling and Estim for pain PRN    PT Home Exercise Plan  Access Code: [facility]    Consulted and Agree with Plan of Care  Patient       Patient will benefit from skilled therapeutic intervention in order to improve the following deficits and impairments:  Abnormal gait,  Decreased activity tolerance, Decreased balance, Decreased mobility, Decreased endurance, Decreased range of motion, Decreased strength, Increased edema, Difficulty walking, Increased fascial  restricitons, Increased muscle spasms, Impaired flexibility, Pain  Visit Diagnosis: Acute pain of right knee  Stiffness of right knee, not elsewhere classified  Other abnormalities of gait and mobility  Muscle weakness (generalized)  Localized edema     Problem List Patient Active Problem List   Diagnosis Date Noted  . Status post total hip replacement, right 03/05/2019  . Arthritis of right knee 03/04/2019  . Unilateral primary osteoarthritis, right knee 02/05/2019  . Knee locking, right 01/09/2019  . Metatarsal stress fracture, right, initial encounter 10/03/2016  . DJD (degenerative joint disease) 06/09/2016  . Labile hypertension 08/01/2014  . Vitamin D deficiency 08/01/2014  . Medication management 08/01/2014  . Obesity 07/04/2014  . Hyperlipemia 06/27/2013  . Attention deficit disorder (ADD)   . GERD (gastroesophageal reflux disease)   . Allergy   . Depression, major, in remission ([place])   . Anemia   . Ocular rosacea   . HSV infection   . Osteopenia     Debbe Odea, PT,DPT 04/24/2019, 12:45 PM  [facility] Physical Therapy [address] [place], [place], [address] Phone: [phone]   Fax:  [phone]  Name: Kathy Cook MRN: [id number] Date of Birth: [birth year]

## 2019-04-26 ENCOUNTER — Ambulatory Visit: Payer: Medicare Other | Admitting: Physical Therapy

## 2019-04-26 ENCOUNTER — Other Ambulatory Visit: Payer: Self-pay

## 2019-04-26 DIAGNOSIS — M25561 Pain in right knee: Secondary | ICD-10-CM | POA: Diagnosis not present

## 2019-04-26 DIAGNOSIS — M25661 Stiffness of right knee, not elsewhere classified: Secondary | ICD-10-CM | POA: Diagnosis not present

## 2019-04-26 DIAGNOSIS — M6281 Muscle weakness (generalized): Secondary | ICD-10-CM

## 2019-04-26 DIAGNOSIS — R2689 Other abnormalities of gait and mobility: Secondary | ICD-10-CM

## 2019-04-26 DIAGNOSIS — R600 Localized edema: Secondary | ICD-10-CM

## 2019-04-26 NOTE — Therapy (Signed)
[facility] Physical Therapy [address] [place], [place], [address] Phone: [phone]   Fax:  [phone]  Physical Therapy Treatment  Patient Details  Name: Kathy Cook MRN: [id number] Date of Birth: [birth year] Referring Provider (PT): [name], MD   Encounter Date: 04/26/2019  PT End of Session - 04/26/19 1633    Visit Number  13    Number of Visits  18    Date for PT Re-Evaluation  05/15/19    Authorization Type  UHC MCR    PT Start Time  7510    PT Stop Time  1400    PT Time Calculation (min)  45 min    Activity Tolerance  Patient tolerated treatment well    Behavior During Therapy  [facility] for tasks assessed/performed       Past Medical History:  Diagnosis Date  . Allergy   . Anxiety   . Attention deficit disorder (ADD)   . Cancer [facility])    ? basal/squam cell on chest, and forehead  . Depression   . Dry eyes   . GERD (gastroesophageal reflux disease)   . HSV infection   . Lumbar degenerative disc disease   . Ocular rosacea   . Osteopenia     Past Surgical History:  Procedure Laterality Date  . SKIN BIOPSY     ?basal/squam cell on chest  . TONSILLECTOMY AND ADENOIDECTOMY    . TOTAL KNEE ARTHROPLASTY Right 03/04/2019   Procedure: RIGHT TOTAL KNEE ARTHROPLASTY;  Surgeon: [name], MD;  Location: [place];  Service: Orthopedics;  Laterality: Right;    There were no vitals filed for this visit.  Subjective Assessment - 04/26/19 1458    Subjective  only mild pain, just achy and annoying in my knee. Feeling tired today overall    Pertinent History  PMH: Rt TKA 03/04/19, Ca,lumbar DDD, osteopenia,    How long can you sit comfortably?  20    How long can you stand comfortably?  5 min    Patient Stated Goals  get the pain down and get back to normsl    Pain Onset  More than a month ago                       [facility] Adult PT Treatment/Exercise - 04/26/19 0001      Knee/Hip Exercises: Stretches   Active Hamstring  Stretch  Right;3 reps;30 seconds    Active Hamstring Stretch Limitations  with foot propped on 8 inch step    Passive Hamstring Stretch  Right;3 reps;30 seconds    Passive Hamstring Stretch Limitations  passive in supine with MET  X3 reps    Quad Stretch  Right;3 reps;30 seconds    Quad Stretch Limitations  passive in prone with MET X 3 reps    Knee: Self-Stretch Limitations  lunge stretch 10 sec X 10 reps with foot on 8 inch step    Other Knee/Hip Stretches  prone lying off EOB for knee extension X 5 min with STM/IASTM    Other Knee/Hip Stretches  slantboard 30 sec X 3      Knee/Hip Exercises: Aerobic   Nustep  L7 X 10 min for knee ROM      Knee/Hip Exercises: Machines for Strengthening   Total Gym Leg Press  bilat push 81 lbs 2X15, then dropped to 56 lbs for Rt leg only push 2X10      Knee/Hip Exercises: Standing   Forward Step Up  Right;15 reps;Hand Hold: 1;Step Height: 8"    SLS  30 sec X 2 reps on Rt leg on airex pad      Vasopneumatic   Number Minutes Vasopneumatic   10 minutes    Vasopnuematic Location   Knee    Vasopneumatic Pressure  Medium    Vasopneumatic Temperature   34      Manual Therapy   Manual therapy comments  Rt knee PROM to tolerance flexion and extension, flexion and extension mobs grade 2-3, passive hamstring and quad stretching, STM/IASTM to H.S. gastroc in prone hang               PT Short Term Goals - 04/19/19 1426      PT SHORT TERM GOAL #1   Title  she will be indpendent with initial HEP    Time  4    Period  Weeks    Status  Achieved    Target Date  04/17/19      PT SHORT TERM GOAL #2   Title  She will report less than 5/10 overall pain    Time  4    Period  Weeks    Status  Achieved      PT SHORT TERM GOAL #3   Title  WIll improve knee ROM 10-100 to improve function.    Status  Achieved        PT Long Term Goals - 04/24/19 1244      PT LONG TERM GOAL #1   Title  She will be indpendent with all HEP issued. (target for all  goals is 05/15/18)    Time  8    Period  Weeks    Status  On-going      PT LONG TERM GOAL #2   Title  Will reduce pain to overall less than 3/10 with ususal activity.    Status  On-going      PT LONG TERM GOAL #3   Title  Pt will be able to ambulate community distances no AD WFL gait pattern and be able to negotiate one flight of stairs reciprocally.    Time  8    Period  Weeks    Status  New      PT LONG TERM GOAL #4   Title  Pt will improve Rt knee AROM 3-115 to improve function.    Time  8    Period  Weeks    Status  On-going      PT LONG TERM GOAL #5   Title  Pt will increased Rt knee strength to 5/5 and Rt hip strength to 4+/5 MMT to improve function    Status  On-going            Plan - 04/26/19 1634    Clinical Impression Statement  improving with gait but stilll missing some ROM, PT will continue to progress ROM as able.    Personal Factors and Comorbidities  Comorbidity 3+    Comorbidities  PMH: Rt TKA 03/04/19, Ca,lumbar DDD, osteopenia,    Examination-Activity Limitations  Squat;Stairs;Stand;Lift;Dressing;Locomotion Level;Transfers    Examination-Participation Restrictions  Cleaning;Meal Prep;Community Activity;Driving;Laundry;Shop    Stability/Clinical Decision Making  Evolving/Moderate complexity    Rehab Potential  Good    PT Frequency  3x / week    PT Duration  8 weeks    PT Treatment/Interventions  ADLs/Self Care Home Management;Cryotherapy;Moist Heat;Gait training;Stair training;Therapeutic activities;Therapeutic exercise;Balance training;Neuromuscular re-education;Manual techniques;Passive range of motion;Dry needling;Joint Manipulations;Taping;Vasopneumatic Device    PT Next  Visit Plan  needs ROM, LE strength, gait progression off of AD as able, stairs when able, vaso for swelling and Estim for pain PRN    PT Home Exercise Plan  Access Code: [facility]    Consulted and Agree with Plan of Care  Patient       Patient will benefit from skilled therapeutic  intervention in order to improve the following deficits and impairments:  Abnormal gait, Decreased activity tolerance, Decreased balance, Decreased mobility, Decreased endurance, Decreased range of motion, Decreased strength, Increased edema, Difficulty walking, Increased fascial restricitons, Increased muscle spasms, Impaired flexibility, Pain  Visit Diagnosis: Acute pain of right knee  Stiffness of right knee, not elsewhere classified  Other abnormalities of gait and mobility  Muscle weakness (generalized)  Localized edema     Problem List Patient Active Problem List   Diagnosis Date Noted  . Status post total hip replacement, right 03/05/2019  . Arthritis of right knee 03/04/2019  . Unilateral primary osteoarthritis, right knee 02/05/2019  . Knee locking, right 01/09/2019  . Metatarsal stress fracture, right, initial encounter 10/03/2016  . DJD (degenerative joint disease) 06/09/2016  . Labile hypertension 08/01/2014  . Vitamin D deficiency 08/01/2014  . Medication management 08/01/2014  . Obesity 07/04/2014  . Hyperlipemia 06/27/2013  . Attention deficit disorder (ADD)   . GERD (gastroesophageal reflux disease)   . Allergy   . Depression, major, in remission ([place])   . Anemia   . Ocular rosacea   . HSV infection   . Osteopenia     Silvestre Mesi 04/26/2019, 4:35 PM  [facility] Physical Therapy [address] [place], [place], [address] Phone: [phone]   Fax:  [phone]  Name: Kathy Cook MRN: [id number] Date of Birth: 1945/10/12

## 2019-04-29 ENCOUNTER — Other Ambulatory Visit: Payer: Self-pay

## 2019-04-29 ENCOUNTER — Ambulatory Visit: Payer: Medicare Other | Admitting: Physical Therapy

## 2019-04-29 DIAGNOSIS — R2689 Other abnormalities of gait and mobility: Secondary | ICD-10-CM | POA: Diagnosis not present

## 2019-04-29 DIAGNOSIS — M25661 Stiffness of right knee, not elsewhere classified: Secondary | ICD-10-CM | POA: Diagnosis not present

## 2019-04-29 DIAGNOSIS — M6281 Muscle weakness (generalized): Secondary | ICD-10-CM

## 2019-04-29 DIAGNOSIS — R600 Localized edema: Secondary | ICD-10-CM | POA: Diagnosis not present

## 2019-04-29 DIAGNOSIS — M25561 Pain in right knee: Secondary | ICD-10-CM | POA: Diagnosis not present

## 2019-04-29 NOTE — Therapy (Signed)
[facility] Physical Therapy [address] [place], [place], [address] Phone: [phone]   Fax:  [phone]  Physical Therapy Treatment  Patient Details  Name: Kathy Cook MRN: [id number] Date of Birth: 01-Jul-1945 Referring Provider (PT): [name], MD   Encounter Date: 04/29/2019  PT End of Session - 04/29/19 1242    Visit Number  14    Number of Visits  18    Date for PT Re-Evaluation  05/15/19    Authorization Type  UHC MCR    PT Start Time  1287    PT Stop Time  1245    PT Time Calculation (min)  60 min    Activity Tolerance  Patient tolerated treatment well    Behavior During Therapy  [facility] for tasks assessed/performed       Past Medical History:  Diagnosis Date  . Allergy   . Anxiety   . Attention deficit disorder (ADD)   . Cancer [facility])    ? basal/squam cell on chest, and forehead  . Depression   . Dry eyes   . GERD (gastroesophageal reflux disease)   . HSV infection   . Lumbar degenerative disc disease   . Ocular rosacea   . Osteopenia     Past Surgical History:  Procedure Laterality Date  . SKIN BIOPSY     ?basal/squam cell on chest  . TONSILLECTOMY AND ADENOIDECTOMY    . TOTAL KNEE ARTHROPLASTY Right 03/04/2019   Procedure: RIGHT TOTAL KNEE ARTHROPLASTY;  Surgeon: [name], MD;  Location: [place];  Service: Orthopedics;  Laterality: Right;    There were no vitals filed for this visit.  Subjective Assessment - 04/29/19 1240    Subjective  not having pain just annoying stiffness, I have really been stretching it at home, I no longer need my cane         [facility] PT Assessment - 04/29/19 0001      Assessment   Medical Diagnosis  Rt TKA    Referring Provider (PT)  [name], MD    Onset Date/Surgical Date  03/04/19      AROM   Right Knee Extension  -4    Right Knee Flexion  103   AAROM seated heelslide                  [facility] Adult PT Treatment/Exercise - 04/29/19 0001      Knee/Hip Exercises:  Stretches   Active Hamstring Stretch  Right;3 reps;30 seconds    Active Hamstring Stretch Limitations  with foot propped on 8 inch step    Passive Hamstring Stretch  Right;3 reps;30 seconds    Passive Hamstring Stretch Limitations  passive in supine with MET  X3 reps    Quad Stretch  Right;3 reps;30 seconds    Quad Stretch Limitations  passive in prone with MET X 3 reps    Knee: Self-Stretch Limitations  lunge stretch 10 sec X 10 reps with foot on 8 inch step    Other Knee/Hip Stretches  prone lying off EOB for knee extension X 5 min with STM/IASTM      Knee/Hip Exercises: Aerobic   Nustep  L7 X 10 min for knee ROM      Knee/Hip Exercises: Machines for Strengthening   Total Gym Leg Press  bilat push 87 lbs 2X15, then dropped to 62 lbs for Rt leg only push 2X10      Vasopneumatic   Number Minutes Vasopneumatic   10 minutes  Vasopnuematic Location   Knee    Vasopneumatic Pressure  Medium    Vasopneumatic Temperature   34      Manual Therapy   Manual therapy comments  Rt knee PROM to tolerance flexion and extension, flexion and extension mobs grade 2-3, passive hamstring and quad stretching, STM/IASTM to H.S. gastroc in prone hang               PT Short Term Goals - 04/19/19 1426      PT SHORT TERM GOAL #1   Title  she will be indpendent with initial HEP    Time  4    Period  Weeks    Status  Achieved    Target Date  04/17/19      PT SHORT TERM GOAL #2   Title  She will report less than 5/10 overall pain    Time  4    Period  Weeks    Status  Achieved      PT SHORT TERM GOAL #3   Title  WIll improve knee ROM 10-100 to improve function.    Status  Achieved        PT Long Term Goals - 04/24/19 1244      PT LONG TERM GOAL #1   Title  She will be indpendent with all HEP issued. (target for all goals is 05/15/18)    Time  8    Period  Weeks    Status  On-going      PT LONG TERM GOAL #2   Title  Will reduce pain to overall less than 3/10 with ususal activity.     Status  On-going      PT LONG TERM GOAL #3   Title  Pt will be able to ambulate community distances no AD WFL gait pattern and be able to negotiate one flight of stairs reciprocally.    Time  8    Period  Weeks    Status  New      PT LONG TERM GOAL #4   Title  Pt will improve Rt knee AROM 3-115 to improve function.    Time  8    Period  Weeks    Status  On-going      PT LONG TERM GOAL #5   Title  Pt will increased Rt knee strength to 5/5 and Rt hip strength to 4+/5 MMT to improve function    Status  On-going            Plan - 04/29/19 1242    Clinical Impression Statement  Showed some improvements in ROM with updated measurements however continues to lack knee ROM and will continue to benefit from PT    Personal Factors and Comorbidities  Comorbidity 3+    Comorbidities  PMH: Rt TKA 03/04/19, Ca,lumbar DDD, osteopenia,    Examination-Activity Limitations  Squat;Stairs;Stand;Lift;Dressing;Locomotion Level;Transfers    Examination-Participation Restrictions  Cleaning;Meal Prep;Community Activity;Driving;Laundry;Shop    Stability/Clinical Decision Making  Evolving/Moderate complexity    Rehab Potential  Good    PT Frequency  3x / week    PT Duration  8 weeks    PT Treatment/Interventions  ADLs/Self Care Home Management;Cryotherapy;Moist Heat;Gait training;Stair training;Therapeutic activities;Therapeutic exercise;Balance training;Neuromuscular re-education;Manual techniques;Passive range of motion;Dry needling;Joint Manipulations;Taping;Vasopneumatic Device    PT Next Visit Plan  needs ROM, vaso for edema and inflammation, functional strength    PT Home Exercise Plan  Access Code: [facility]    Consulted and Agree with Plan of Care  Patient  Patient will benefit from skilled therapeutic intervention in order to improve the following deficits and impairments:  Abnormal gait, Decreased activity tolerance, Decreased balance, Decreased mobility, Decreased endurance, Decreased  range of motion, Decreased strength, Increased edema, Difficulty walking, Increased fascial restricitons, Increased muscle spasms, Impaired flexibility, Pain  Visit Diagnosis: Acute pain of right knee  Stiffness of right knee, not elsewhere classified  Other abnormalities of gait and mobility  Muscle weakness (generalized)  Localized edema     Problem List Patient Active Problem List   Diagnosis Date Noted  . Status post total hip replacement, right 03/05/2019  . Arthritis of right knee 03/04/2019  . Unilateral primary osteoarthritis, right knee 02/05/2019  . Knee locking, right 01/09/2019  . Metatarsal stress fracture, right, initial encounter 10/03/2016  . DJD (degenerative joint disease) 06/09/2016  . Labile hypertension 08/01/2014  . Vitamin D deficiency 08/01/2014  . Medication management 08/01/2014  . Obesity 07/04/2014  . Hyperlipemia 06/27/2013  . Attention deficit disorder (ADD)   . GERD (gastroesophageal reflux disease)   . Allergy   . Depression, major, in remission ([place])   . Anemia   . Ocular rosacea   . HSV infection   . Osteopenia     Debbe Odea , PT,DPT 04/29/2019, 12:47 PM  [facility] Physical Therapy [address] [place], [place], [address] Phone: [phone]   Fax:  [phone]  Name: Kathy Cook MRN: [id number] Date of Birth: 05-31-1945

## 2019-05-01 ENCOUNTER — Ambulatory Visit: Payer: Medicare Other | Admitting: Physical Therapy

## 2019-05-01 ENCOUNTER — Ambulatory Visit: Payer: Medicare Other | Attending: Internal Medicine

## 2019-05-01 ENCOUNTER — Other Ambulatory Visit: Payer: Self-pay

## 2019-05-01 ENCOUNTER — Ambulatory Visit: Payer: Medicare Other

## 2019-05-01 DIAGNOSIS — M25561 Pain in right knee: Secondary | ICD-10-CM | POA: Diagnosis not present

## 2019-05-01 DIAGNOSIS — M25661 Stiffness of right knee, not elsewhere classified: Secondary | ICD-10-CM

## 2019-05-01 DIAGNOSIS — M6281 Muscle weakness (generalized): Secondary | ICD-10-CM

## 2019-05-01 DIAGNOSIS — H2513 Age-related nuclear cataract, bilateral: Secondary | ICD-10-CM | POA: Diagnosis not present

## 2019-05-01 DIAGNOSIS — R600 Localized edema: Secondary | ICD-10-CM

## 2019-05-01 DIAGNOSIS — Z23 Encounter for immunization: Secondary | ICD-10-CM | POA: Insufficient documentation

## 2019-05-01 DIAGNOSIS — H43812 Vitreous degeneration, left eye: Secondary | ICD-10-CM | POA: Diagnosis not present

## 2019-05-01 DIAGNOSIS — R2689 Other abnormalities of gait and mobility: Secondary | ICD-10-CM

## 2019-05-01 DIAGNOSIS — H353131 Nonexudative age-related macular degeneration, bilateral, early dry stage: Secondary | ICD-10-CM | POA: Diagnosis not present

## 2019-05-01 NOTE — Progress Notes (Signed)
   Covid-19 Vaccination Clinic  Name:  Kathy Cook    MRN: [id number] DOB: 03-Jul-1945  05/01/2019  [name] was observed post Covid-19 immunization for 15 minutes without incident. She was provided with Vaccine Information Sheet and instruction to access the V-Safe system.   [name] was instructed to call 911 with any severe reactions post vaccine: Marland Kitchen Difficulty breathing  . Swelling of face and throat  . A fast heartbeat  . A bad rash all over body  . Dizziness and weakness   Immunizations Administered    Name Date Dose VIS Date Route   Pfizer COVID-19 Vaccine 05/01/2019  1:52 PM 0.3 mL 02/08/2019 Intramuscular   Manufacturer: [place]   Lot: [id number]   [place]: [id number]

## 2019-05-01 NOTE — Therapy (Signed)
[facility] Physical Therapy [address] [place], [place], [address] Phone: [phone]   Fax:  [phone]  Physical Therapy Treatment  Patient Details  Name: Kathy Cook MRN: [id number] Date of Birth: [birth year] Referring Provider (PT): [name], MD   Encounter Date: 05/01/2019  PT End of Session - 05/01/19 1202    Visit Number  15    Number of Visits  18    Date for PT Re-Evaluation  05/15/19    Authorization Type  UHC MCR    PT Start Time  8850    PT Stop Time  2774    PT Time Calculation (min)  50 min    Activity Tolerance  Patient tolerated treatment well    Behavior During Therapy  [facility] for tasks assessed/performed       Past Medical History:  Diagnosis Date  . Allergy   . Anxiety   . Attention deficit disorder (ADD)   . Cancer [facility])    ? basal/squam cell on chest, and forehead  . Depression   . Dry eyes   . GERD (gastroesophageal reflux disease)   . HSV infection   . Lumbar degenerative disc disease   . Ocular rosacea   . Osteopenia     Past Surgical History:  Procedure Laterality Date  . SKIN BIOPSY     ?basal/squam cell on chest  . TONSILLECTOMY AND ADENOIDECTOMY    . TOTAL KNEE ARTHROPLASTY Right 03/04/2019   Procedure: RIGHT TOTAL KNEE ARTHROPLASTY;  Surgeon: [name], MD;  Location: [facility];  Service: Orthopedics;  Laterality: Right;    There were no vitals filed for this visit.  Subjective Assessment - 05/01/19 1202    Subjective  no pain just tighntess in my knee    Pertinent History  PMH: Rt TKA 03/04/19, Ca,lumbar DDD, osteopenia,         [facility] Adult PT Treatment/Exercise - 05/01/19 0001      Knee/Hip Exercises: Stretches   Active Hamstring Stretch  Right;3 reps;30 seconds    Active Hamstring Stretch Limitations  with foot propped on 8 inch step    Passive Hamstring Stretch  Right;3 reps;30 seconds    Passive Hamstring Stretch Limitations  passive in supine with MET  X3 reps    Quad Stretch  Right;3  reps;30 seconds    Quad Stretch Limitations  passive in prone with MET X 3 reps    Knee: Self-Stretch Limitations  lunge stretch 10 sec X 10 reps with foot on 8 inch step    Other Knee/Hip Stretches  prone lying off EOB for knee extension X 5 min with STM/IASTM    Other Knee/Hip Stretches  slantboard 30 sec X 3      Knee/Hip Exercises: Aerobic   Nustep  L7 X 8 min for knee ROM      Knee/Hip Exercises: Machines for Strengthening   Total Gym Leg Press  bilat push 87 lbs 2X15, then dropped to 62 lbs for Rt leg only push 2X10      Vasopneumatic   Number Minutes Vasopneumatic   10 minutes    Vasopnuematic Location   Knee    Vasopneumatic Pressure  Medium    Vasopneumatic Temperature   34      Manual Therapy   Manual therapy comments  Rt knee PROM to tolerance flexion and extension, flexion and extension mobs grade 2-3, passive hamstring and quad stretching, STM/IASTM to H.S. gastroc in prone hang  PT Short Term Goals - 04/19/19 1426      PT SHORT TERM GOAL #1   Title  she will be indpendent with initial HEP    Time  4    Period  Weeks    Status  Achieved    Target Date  04/17/19      PT SHORT TERM GOAL #2   Title  She will report less than 5/10 overall pain    Time  4    Period  Weeks    Status  Achieved      PT SHORT TERM GOAL #3   Title  WIll improve knee ROM 10-100 to improve function.    Status  Achieved        PT Long Term Goals - 04/24/19 1244      PT LONG TERM GOAL #1   Title  She will be indpendent with all HEP issued. (target for all goals is 05/15/18)    Time  8    Period  Weeks    Status  On-going      PT LONG TERM GOAL #2   Title  Will reduce pain to overall less than 3/10 with ususal activity.    Status  On-going      PT LONG TERM GOAL #3   Title  Pt will be able to ambulate community distances no AD WFL gait pattern and be able to negotiate one flight of stairs reciprocally.    Time  8    Period  Weeks    Status  New      PT  LONG TERM GOAL #4   Title  Pt will improve Rt knee AROM 3-115 to improve function.    Time  8    Period  Weeks    Status  On-going      PT LONG TERM GOAL #5   Title  Pt will increased Rt knee strength to 5/5 and Rt hip strength to 4+/5 MMT to improve function    Status  On-going            Plan - 05/01/19 1203    Clinical Impression Statement  She is doing well with pain, strength, and standing activity tolerance but continues to have deficits in knee ROM. Focused on this again today as tolerated and will continue to foucs on this as this is her only significant deficit. used vaso at end of treatment to reduce overall pain and soreness.    Personal Factors and Comorbidities  Comorbidity 3+    Comorbidities  PMH: Rt TKA 03/04/19, Ca,lumbar DDD, osteopenia,    Examination-Activity Limitations  Squat;Stairs;Stand;Lift;Dressing;Locomotion Level;Transfers    Examination-Participation Restrictions  Cleaning;Meal Prep;Community Activity;Driving;Laundry;Shop    Stability/Clinical Decision Making  Evolving/Moderate complexity    Rehab Potential  Good    PT Frequency  3x / week    PT Duration  8 weeks    PT Treatment/Interventions  ADLs/Self Care Home Management;Cryotherapy;Moist Heat;Gait training;Stair training;Therapeutic activities;Therapeutic exercise;Balance training;Neuromuscular re-education;Manual techniques;Passive range of motion;Dry needling;Joint Manipulations;Taping;Vasopneumatic Device    PT Next Visit Plan  needs ROM, vaso for edema and inflammation, functional strength    PT Home Exercise Plan  Access Code: [facility]    Consulted and Agree with Plan of Care  Patient       Patient will benefit from skilled therapeutic intervention in order to improve the following deficits and impairments:  Abnormal gait, Decreased activity tolerance, Decreased balance, Decreased mobility, Decreased endurance, Decreased range of motion, Decreased strength, Increased edema,  Difficulty walking,  Increased fascial restricitons, Increased muscle spasms, Impaired flexibility, Pain  Visit Diagnosis: Acute pain of right knee  Stiffness of right knee, not elsewhere classified  Other abnormalities of gait and mobility  Muscle weakness (generalized)  Localized edema     Problem List Patient Active Problem List   Diagnosis Date Noted  . Status post total hip replacement, right 03/05/2019  . Arthritis of right knee 03/04/2019  . Unilateral primary osteoarthritis, right knee 02/05/2019  . Knee locking, right 01/09/2019  . Metatarsal stress fracture, right, initial encounter 10/03/2016  . DJD (degenerative joint disease) 06/09/2016  . Labile hypertension 08/01/2014  . Vitamin D deficiency 08/01/2014  . Medication management 08/01/2014  . Obesity 07/04/2014  . Hyperlipemia 06/27/2013  . Attention deficit disorder (ADD)   . GERD (gastroesophageal reflux disease)   . Allergy   . Depression, major, in remission ([place])   . Anemia   . Ocular rosacea   . HSV infection   . Osteopenia     Debbe Odea, PT,DPT 05/01/2019, 2:05 PM  [facility] Physical Therapy [address] [place], [place], [address] Phone: [phone]   Fax:  [phone]  Name: Kathy Cook MRN: [id number] Date of Birth: 11/28/1945

## 2019-05-03 ENCOUNTER — Ambulatory Visit: Payer: Medicare Other | Admitting: Physical Therapy

## 2019-05-03 ENCOUNTER — Other Ambulatory Visit: Payer: Self-pay

## 2019-05-03 DIAGNOSIS — R2689 Other abnormalities of gait and mobility: Secondary | ICD-10-CM | POA: Diagnosis not present

## 2019-05-03 DIAGNOSIS — M6281 Muscle weakness (generalized): Secondary | ICD-10-CM

## 2019-05-03 DIAGNOSIS — R600 Localized edema: Secondary | ICD-10-CM

## 2019-05-03 DIAGNOSIS — M25561 Pain in right knee: Secondary | ICD-10-CM | POA: Diagnosis not present

## 2019-05-03 DIAGNOSIS — M25661 Stiffness of right knee, not elsewhere classified: Secondary | ICD-10-CM | POA: Diagnosis not present

## 2019-05-03 NOTE — Therapy (Signed)
[facility] Physical Therapy [address] [place], [place], [address] Phone: [phone]   Fax:  [phone]  Physical Therapy Treatment  Patient Details  Name: Kathy Cook MRN: [id number] Date of Birth: 1945/11/11 Referring Provider (PT): [name], MD   Encounter Date: 05/03/2019  PT End of Session - 05/03/19 1345    Visit Number  16    Number of Visits  18    Date for PT Re-Evaluation  05/15/19    Authorization Type  UHC MCR    PT Start Time  8756    PT Stop Time  4332    PT Time Calculation (min)  50 min    Activity Tolerance  Patient tolerated treatment well    Behavior During Therapy  [facility] for tasks assessed/performed       Past Medical History:  Diagnosis Date  . Allergy   . Anxiety   . Attention deficit disorder (ADD)   . Cancer [facility])    ? basal/squam cell on chest, and forehead  . Depression   . Dry eyes   . GERD (gastroesophageal reflux disease)   . HSV infection   . Lumbar degenerative disc disease   . Ocular rosacea   . Osteopenia     Past Surgical History:  Procedure Laterality Date  . SKIN BIOPSY     ?basal/squam cell on chest  . TONSILLECTOMY AND ADENOIDECTOMY    . TOTAL KNEE ARTHROPLASTY Right 03/04/2019   Procedure: RIGHT TOTAL KNEE ARTHROPLASTY;  Surgeon: [name], MD;  Location: [place];  Service: Orthopedics;  Laterality: Right;    There were no vitals filed for this visit.  Subjective Assessment - 05/03/19 1335    Subjective  no pain just tighntess in my knee, I still cant do the steps reciprocally.    Pertinent History  PMH: Rt TKA 03/04/19, Ca,lumbar DDD, osteopenia,    How long can you sit comfortably?  20    How long can you stand comfortably?  5 min    Patient Stated Goals  get the pain down and get back to normsl    Pain Onset  More than a month ago         [facility] PT Assessment - 05/03/19 0001      Assessment   Medical Diagnosis  Rt TKA    Referring Provider (PT)  [name], MD    Onset  Date/Surgical Date  03/04/19      AROM   Overall AROM Comments  measurements taken at the end after stretching    Right Knee Extension  -6    Right Knee Flexion  105      PROM   Right Knee Extension  -4    Right Knee Flexion  109                   [facility] Adult PT Treatment/Exercise - 05/03/19 0001      Knee/Hip Exercises: Stretches   Active Hamstring Stretch  Right;3 reps;30 seconds    Active Hamstring Stretch Limitations  with foot propped on 8 inch step    Passive Hamstring Stretch  Right;3 reps;30 seconds    Passive Hamstring Stretch Limitations  passive in supine with MET  X3 reps    Quad Stretch  Right;3 reps;30 seconds    Quad Stretch Limitations  supine with strap    Other Knee/Hip Stretches  prone lying off EOB for knee extension  2 lb 2X 3 min with STM/IASTM  Other Knee/Hip Stretches  seated heelslides with self O.P. 10 sec X 10      Knee/Hip Exercises: Aerobic   Nustep  L7 X 10 min for knee ROM      Knee/Hip Exercises: Standing   Other Standing Knee Exercises  up/down stairs in hall reciprocally started with bilat UE support on first set, then progressed to one UE support on last 2 sets      Vasopneumatic   Number Minutes Vasopneumatic   10 minutes    Vasopnuematic Location   Knee    Vasopneumatic Pressure  Medium    Vasopneumatic Temperature   34      Manual Therapy   Manual therapy comments  Rt knee PROM to tolerance flexion and extension, flexion and extension mobs grade 2-3, passive hamstring and quad stretching, STM/IASTM to H.S. gastroc in prone hang               PT Short Term Goals - 04/19/19 1426      PT SHORT TERM GOAL #1   Title  she will be indpendent with initial HEP    Time  4    Period  Weeks    Status  Achieved    Target Date  04/17/19      PT SHORT TERM GOAL #2   Title  She will report less than 5/10 overall pain    Time  4    Period  Weeks    Status  Achieved      PT SHORT TERM GOAL #3   Title  WIll improve knee  ROM 10-100 to improve function.    Status  Achieved        PT Long Term Goals - 04/24/19 1244      PT LONG TERM GOAL #1   Title  She will be indpendent with all HEP issued. (target for all goals is 05/15/18)    Time  8    Period  Weeks    Status  On-going      PT LONG TERM GOAL #2   Title  Will reduce pain to overall less than 3/10 with ususal activity.    Status  On-going      PT LONG TERM GOAL #3   Title  Pt will be able to ambulate community distances no AD WFL gait pattern and be able to negotiate one flight of stairs reciprocally.    Time  8    Period  Weeks    Status  New      PT LONG TERM GOAL #4   Title  Pt will improve Rt knee AROM 3-115 to improve function.    Time  8    Period  Weeks    Status  On-going      PT LONG TERM GOAL #5   Title  Pt will increased Rt knee strength to 5/5 and Rt hip strength to 4+/5 MMT to improve function    Status  On-going            Plan - 05/03/19 1346    Clinical Impression Statement  Showed improvements with flexion ROM but slower progress with extension ROM. PT will continue to progress her ROM as tolerated.    Personal Factors and Comorbidities  Comorbidity 3+    Comorbidities  PMH: Rt TKA 03/04/19, Ca,lumbar DDD, osteopenia,    Examination-Activity Limitations  Squat;Stairs;Stand;Lift;Dressing;Locomotion Level;Transfers    Examination-Participation Restrictions  Cleaning;Meal Prep;Community Activity;Driving;Laundry;Shop    Stability/Clinical Decision Making  Evolving/Moderate complexity  Rehab Potential  Good    PT Frequency  3x / week    PT Duration  8 weeks    PT Treatment/Interventions  ADLs/Self Care Home Management;Cryotherapy;Moist Heat;Gait training;Stair training;Therapeutic activities;Therapeutic exercise;Balance training;Neuromuscular re-education;Manual techniques;Passive range of motion;Dry needling;Joint Manipulations;Taping;Vasopneumatic Device    PT Next Visit Plan  needs ROM, vaso for edema and  inflammation, functional strength    PT Home Exercise Plan  Access Code: [facility]    Consulted and Agree with Plan of Care  Patient       Patient will benefit from skilled therapeutic intervention in order to improve the following deficits and impairments:  Abnormal gait, Decreased activity tolerance, Decreased balance, Decreased mobility, Decreased endurance, Decreased range of motion, Decreased strength, Increased edema, Difficulty walking, Increased fascial restricitons, Increased muscle spasms, Impaired flexibility, Pain  Visit Diagnosis: Acute pain of right knee  Stiffness of right knee, not elsewhere classified  Other abnormalities of gait and mobility  Muscle weakness (generalized)  Localized edema     Problem List Patient Active Problem List   Diagnosis Date Noted  . Status post total hip replacement, right 03/05/2019  . Arthritis of right knee 03/04/2019  . Unilateral primary osteoarthritis, right knee 02/05/2019  . Knee locking, right 01/09/2019  . Metatarsal stress fracture, right, initial encounter 10/03/2016  . DJD (degenerative joint disease) 06/09/2016  . Labile hypertension 08/01/2014  . Vitamin D deficiency 08/01/2014  . Medication management 08/01/2014  . Obesity 07/04/2014  . Hyperlipemia 06/27/2013  . Attention deficit disorder (ADD)   . GERD (gastroesophageal reflux disease)   . Allergy   . Depression, major, in remission ([place])   . Anemia   . Ocular rosacea   . HSV infection   . Osteopenia     Debbe Odea, PT,DPT 05/03/2019, 1:59 PM  [facility] Physical Therapy [address] [place], [place], [address] Phone: [phone]   Fax:  [phone]  Name: Kathy Cook MRN: [id number] Date of Birth: 12-12-1945

## 2019-05-03 NOTE — Progress Notes (Signed)
CPE and follow up visit.  Assessment:   Left breast mass Patient has had mass x 8 months, getting larger, non tender.  Had 2nd COVID vaccine recently however with mass will schedule diagnostic and not wait  HTN, labile - continue medications, DASH diet, exercise and monitor at home. Call if greater than 130/80.    Hyperlipemia -continue medications, check lipids, decrease fatty foods, increase activity.   statin/zetia intolerant - on welchol- no family history of CAD, may get coronary calcium score- discussed and will think about it  Obesity Obesity with co morbidities- long discussion about weight loss, diet, and exercise   Attention deficit disorder (ADD) Continue adderall as needed  Abnormal glucose Discussed disease progression and risks Discussed diet/exercise, weight management and risk modification   Anemia, unspecified anemia type - monitor, continue iron supp with Vitamin C and increase green leafy veggies   Allergy, subsequent encounter Continue OTC allergy pills  Depression, major, in remission ([place]) Remission  Ocular rosacea Follow up eye doctor   Osteopenia Get DEXA with GYN, continue vitamin D,Calcium  Gastroesophageal reflux disease, esophagitis presence not specified Continue PPI/H2 blocker, diet discussed  Vitamin D deficiency - VITAMIN D 25 Hydroxy (Vit-D Deficiency, Fractures)   Medication management - Magnesium  HSV infection Refill meds   Future Appointments  Date Time Provider [place]  05/07/2019 11:45 AM Scot Jun B, PT [facility] None  05/09/2019 11:45 AM Scot Jun B, PT [facility] None  05/20/2019 11:00 AM Elsie Ra R, PT [facility] None  05/22/2019 11:00 AM Elsie Ra R, PT [facility] None  05/24/2019 11:45 AM Elsie Ra R, PT [facility] None  05/27/2019 11:45 AM Elsie Ra R, PT [facility] None  05/29/2019 11:45 AM Elsie Ra R, PT [facility] None  05/30/2019 11:45 AM Elsie Ra R, PT [facility] None  06/03/2019 11:45 AM Elsie Ra R, PT [facility] None  06/05/2019 11:45 AM Elsie Ra R, PT [facility] None  06/07/2019 11:45 AM Debbe Odea, PT [facility] None  11/18/2019  9:00 AM Vicie Mutters, PA-C [facility] None  05/05/2020  2:00 PM Vicie Mutters, PA-C [facility] None      Subjective:   Kathy Cook is a 74 y.o. female who presents for CPE and 3 month follow up on hypertension, hyperlipidemia, vitamin D def.   BMI is Body mass index is 34.75 kg/m., she is working on diet and exercise. Wt Readings from Last 3 Encounters:  05/06/19 190 lb (86.2 kg)  03/19/19 189 lb (85.7 kg)  03/18/19 189 lb (85.7 kg)   She is off celexa/wellbutrin due to sweating with it, doing well. She does well on the Adderall, helps with concentration however has not been taking.  Has HSV 1 and has famcyclovir for occ out break.    Her blood pressure has been controlled at home, today their BP is BP: 130/86 She does workout, walks. She denies chest pain, shortness of breath, dizziness.  She is not on cholesterol medication, can not tolerate statins and could not tolerate zetia due to diarrhea. No family history.  Her cholesterol is not at goal. The cholesterol last visit was:   Lab Results  Component Value Date   CHOL 311 (H) 11/13/2018   HDL 100 11/13/2018   LDLCALC 186 (H) 11/13/2018   TRIG 119 11/13/2018   CHOLHDL 3.1 11/13/2018   Last A1C in the office was:  Lab Results  Component Value Date   HGBA1C 5.4 04/30/2018   Patient is on Vitamin D supplement. Lab Results  Component  Value Date   VD25OH 31 (L) 04/30/2018    Names of Other Physician/Practitioners you currently use: 1. [facility]- here for primary care 2. [place], eye doctor, 04/2018 3.  Dr. Johnnye Sima, dentist, last visit q 6 months Patient Care Team: Unk Pinto, MD as PCP - General (Internal Medicine) Dian Queen, MD as Consulting Physician (Obstetrics and Gynecology) Clent Jacks, MD as Consulting Physician  (Ophthalmology) Lindwood Coke, MD as Consulting Physician (Dermatology)  Medication Review  Current Outpatient Medications (Endocrine & Metabolic):  .  Estradiol-Estriol-Progesterone (BIEST/PROGESTERONE) CREA, Place onto the skin. Marland Kitchen05 mg/ml cream to apply daily .  Progesterone Micronized 10 % CREA, Place onto the skin. Patient applies 2 % cream daily  Current Outpatient Medications (Cardiovascular):  Marland Kitchen  Colesevelam HCl 3.75 g PACK, Dissolve 1 packet in water or juice & Drink Daily   Current Outpatient Medications (Analgesics):  .  aspirin (BAYER ASPIRIN) 325 MG tablet, Take 1 tablet (325 mg total) by mouth daily. Take one aspirin daily for 4 wks then stop   Current Outpatient Medications (Other):  .  amphetamine-dextroamphetamine (ADDERALL) 30 MG tablet, Take 1/2 to 1 tablet 2 x /day as needed for Focus & Concentration (Patient taking differently: Take 15-30 mg by mouth See admin instructions. Take 1/2 to 1 tablet 2 x /day as needed for Focus & Concentration) .  ascorbic acid (VITAMIN C) 500 MG tablet, Take 500 mg by mouth daily. .  Cholecalciferol (VITAMIN D3) 5000 UNITS TABS, Take 10,000 Units by mouth. .  famciclovir (FAMVIR) 500 MG tablet, Take 1 tablet (500 mg total) by mouth 2 (two) times daily. (Patient taking differently: Take 500 mg by mouth 2 (two) times daily as needed (fever blisters). )  Current Problems (verified) Patient Active Problem List   Diagnosis Date Noted  . Status post total hip replacement, right 03/05/2019  . Arthritis of right knee 03/04/2019  . Unilateral primary osteoarthritis, right knee 02/05/2019  . Knee locking, right 01/09/2019  . Metatarsal stress fracture, right, initial encounter 10/03/2016  . DJD (degenerative joint disease) 06/09/2016  . Labile hypertension 08/01/2014  . Vitamin D deficiency 08/01/2014  . Medication management 08/01/2014  . Obesity 07/04/2014  . Hyperlipemia 06/27/2013  . Attention deficit disorder (ADD)   . GERD  (gastroesophageal reflux disease)   . Allergy   . Depression, major, in remission ([place])   . Anemia   . Ocular rosacea   . HSV infection   . Osteopenia     Screening Tests Immunization History  Administered Date(s) Administered  . DT (Pediatric) 01/14/2015  . Influenza, High Dose Seasonal PF 04/30/2018, 11/13/2018  . PFIZER SARS-COV-2 Vaccination 04/07/2019, 05/01/2019  . Pneumococcal Conjugate-13 03/24/2015  . Pneumococcal-Unspecified 12/31/2012  . Td 03/29/2004    Preventative care: Last colonoscopy: 06/2017 last colonoscopy per Dr. Collene Mares Last mammogram: 07/2018  at GYN  Last pap smear/pelvic exam: 2020 at GYN Dr. Tressia Danas DEXA 2019 getting next year at GYN  Prior vaccinations: TD or Tdap: 2016  Influenza: 2020 Pneumococcal: 2014 Prevnar 13: 2017 Shingles/Zostavax: DUE  Allergies Allergies  Allergen Reactions  . Prednisone Nausea And Vomiting  . Statins     paralyzing  . Strattera [Atomoxetine Hcl]     Increased sadness    SURGICAL HISTORY She  has a past surgical history that includes Tonsillectomy and adenoidectomy; Skin biopsy; and Total knee arthroplasty (Right, 03/04/2019). FAMILY HISTORY Her family history includes Depression in her mother; Diabetes in her father; Heart disease in her mother; Hypertension in her  mother; Macular degeneration in her mother. SOCIAL HISTORY She  reports that she quit smoking about 39 years ago. Her smoking use included cigarettes. She has a 7.50 pack-year smoking history. She has never used smokeless tobacco. She reports current alcohol use. She reports that she does not use drugs.  Review of Systems  Constitutional: Negative.   HENT: Negative.   Eyes: Negative.   Respiratory: Negative.   Cardiovascular: Negative.   Gastrointestinal: Negative.   Genitourinary: Negative.   Musculoskeletal: Negative.   Skin: Negative.   Neurological: Positive for tingling (back worse with stress). Negative for dizziness, tremors, sensory  change, speech change, focal weakness, seizures, loss of consciousness and headaches.     Objective:   Blood pressure 130/86, pulse 77, temperature (!) 97.3 F (36.3 C), height 5\' 2"  (1.575 m), weight 190 lb (86.2 kg), SpO2 99 %. Body mass index is 34.75 kg/m.  General appearance: alert, no distress, WD/WN,  female HEENT: normocephalic, sclerae anicteric, pupils dilated at this time due to recent eye exam TMs pearly, nares patent, no discharge or erythema, pharynx normal Oral cavity: MMM, no lesions Neck: supple, no lymphadenopathy, no thyromegaly, no masses Heart: RRR, normal S1, S2, no murmurs Lungs: CTA bilaterally, no wheezes, rhonchi, or rales Abdomen: +bs, soft, non tender, non distended, no masses, no hepatomegaly, no splenomegaly Musculoskeletal: nontender, no swelling, no obvious deformity Extremities: no edema, no cyanosis, no clubbing Pulses: 2+ symmetric, upper and lower extremities, normal cap refill Neurological: alert, oriented x 3, CN2-12 intact, strength normal upper extremities and lower extremities, sensation normal throughout, DTRs 2+ throughout, no cerebellar signs, gait normal Psychiatric: normal affect, behavior normal, pleasant  Breast: Left breast with large 2-3 cm mobile non tender mass at 12 oclock.  Gyn: defer Rectal: defer  EKG declines this year, thinks had with knee replacement    Vicie Mutters, PA-C   05/06/2019

## 2019-05-06 ENCOUNTER — Encounter: Payer: Self-pay | Admitting: Physician Assistant

## 2019-05-06 ENCOUNTER — Other Ambulatory Visit: Payer: Self-pay

## 2019-05-06 ENCOUNTER — Ambulatory Visit (INDEPENDENT_AMBULATORY_CARE_PROVIDER_SITE_OTHER): Payer: Medicare Other | Admitting: Physician Assistant

## 2019-05-06 VITALS — BP 130/86 | HR 77 | Temp 97.3°F | Ht 62.0 in | Wt 190.0 lb

## 2019-05-06 DIAGNOSIS — Z0001 Encounter for general adult medical examination with abnormal findings: Secondary | ICD-10-CM

## 2019-05-06 DIAGNOSIS — D649 Anemia, unspecified: Secondary | ICD-10-CM

## 2019-05-06 DIAGNOSIS — Z131 Encounter for screening for diabetes mellitus: Secondary | ICD-10-CM

## 2019-05-06 DIAGNOSIS — Z0000 Encounter for general adult medical examination without abnormal findings: Secondary | ICD-10-CM | POA: Diagnosis not present

## 2019-05-06 DIAGNOSIS — Z79899 Other long term (current) drug therapy: Secondary | ICD-10-CM | POA: Diagnosis not present

## 2019-05-06 DIAGNOSIS — K219 Gastro-esophageal reflux disease without esophagitis: Secondary | ICD-10-CM

## 2019-05-06 DIAGNOSIS — M8580 Other specified disorders of bone density and structure, unspecified site: Secondary | ICD-10-CM

## 2019-05-06 DIAGNOSIS — T7840XD Allergy, unspecified, subsequent encounter: Secondary | ICD-10-CM

## 2019-05-06 DIAGNOSIS — E559 Vitamin D deficiency, unspecified: Secondary | ICD-10-CM

## 2019-05-06 DIAGNOSIS — R0989 Other specified symptoms and signs involving the circulatory and respiratory systems: Secondary | ICD-10-CM | POA: Diagnosis not present

## 2019-05-06 DIAGNOSIS — L718 Other rosacea: Secondary | ICD-10-CM

## 2019-05-06 DIAGNOSIS — M159 Polyosteoarthritis, unspecified: Secondary | ICD-10-CM

## 2019-05-06 DIAGNOSIS — N6320 Unspecified lump in the left breast, unspecified quadrant: Secondary | ICD-10-CM

## 2019-05-06 DIAGNOSIS — F988 Other specified behavioral and emotional disorders with onset usually occurring in childhood and adolescence: Secondary | ICD-10-CM

## 2019-05-06 DIAGNOSIS — E6609 Other obesity due to excess calories: Secondary | ICD-10-CM

## 2019-05-06 DIAGNOSIS — E785 Hyperlipidemia, unspecified: Secondary | ICD-10-CM

## 2019-05-06 DIAGNOSIS — F325 Major depressive disorder, single episode, in full remission: Secondary | ICD-10-CM

## 2019-05-06 NOTE — Patient Instructions (Addendum)
I'm going to place an order for a cardiac calcium score, this will help [place] quantify how much calcification if any you have in your coronary arteries and can help [place] decide if we need to do aggressive medical management like cholesterol medication.  It will be done at [facility] on [address] in [place] It will be 99 dollars self pay.  You can call [phone] to schedule this, just given them your name and date of birth.   Will set up for diagnostic mammogram of your left breast.   Your LDL could improve, ideally we want it under a 100.  Your LDL is the bad cholesterol that can lead to heart attack and stroke. To lower your number you can decrease your fatty foods, red meat, cheese, milk and increase fiber like whole grains and veggies. You can also add a fiber supplement like Citracel or Benefiber, these do not cause gas and bloating and are safe to use. Especially if you have a strong family history of heart disease or stroke or you have evidence of plaque on any imaging like a chest xray, we may discuss at your next office visit putting you on a medication to get your number below 100.    Fibrocystic Breast Changes  Fibrocystic breast changes are changes in breast tissue that can cause breasts to become swollen, lumpy, or painful. This can happen due to buildup of scar-like tissue (fibrous tissue) or the forming of fluid-filled lumps (cysts) in the breast. This is a common condition, and it is not cancerous (is benign). The exact cause is not known, but it seems to occur when women go through hormonal changes during their menstrual cycle. Fibrocystic breast changes can affect one or both breasts. What are the causes? The exact cause of fibrocystic breast changes is not known. However, this condition:  May be related to the female hormones estrogen and progesterone.  May be influenced by family traits that get passed from parent to child (genetics). What are the signs or symptoms? Symptoms of  this condition may affect one or both breasts, and may include:  Tenderness, mild discomfort, or pain.  Swelling.  Rope-like tissue that can be felt when touching the breast.  Lumps in one or both breasts.  Changes in breast size. Breasts may get larger before the menstrual period and smaller after the menstrual period.  Green or dark brown discharge from the nipple. Symptoms are usually worse before menstrual periods start, and they get better toward the end of menstrual periods. How is this diagnosed? This condition is diagnosed based on your medical history and a physical exam of your breasts. You may also have tests, such as:  A breast X-ray (mammogram).  Ultrasound of your breasts.  MRI.  Removal of a breast tissue sample for testing (breast biopsy). This may be done if your health care provider thinks that something else may be causing changes in your breasts. How is this treated? Often, treatment is not needed for this condition. In some cases, treatment may include:  Taking over-the-counter pain relievers to help lessen pain or discomfort.  Limiting or avoiding caffeine. Foods and beverages that contain caffeine include chocolate, soda, coffee, and tea.  Reducing sugar and fat in your diet. Your health care provider may also recommend:  A procedure to remove fluid from a cyst that is causing pain (fine needle aspiration).  Surgery to remove a cyst that is large or tender or does not go away. Follow these instructions at  home:  Examine your breasts after every menstrual period. If you do not have menstrual periods, check your breasts on the first day of every month. Feel for changes in your breasts, such as: ? More tenderness. ? A new growth. ? A change in size. ? A change in an existing lump.  Take over-the-counter and prescription medicines only as told by your health care provider.  Wear a well-fitted support or sports bra, especially when  exercising.  Decrease or avoid caffeine, fat, and sugar in your diet as directed by your health care provider. Contact a health care provider if:  You have fluid leaking from your nipple, especially if it is bloody.  You have new lumps or bumps in your breast.  Your breast becomes enlarged, red, and painful.  You have areas of your breast that pucker inward.  Your nipple appears flat or indented. Get help right away if:  You have redness of your breast and the redness is spreading. Summary  Fibrocystic breast changes are changes in breast tissue that can cause breasts to become swollen, lumpy, or painful.  This condition may be related to the female hormones estrogen and progesterone.  With this condition, it is important to examine your breasts after every menstrual period. If you do not have menstrual periods, check your breasts on the first day of every month. This information is not intended to replace advice given to you by your health care provider. Make sure you discuss any questions you have with your health care provider. Document Revised: 01/27/2017 Document Reviewed: 10/14/2015 Elsevier Patient Education  [place].

## 2019-05-07 ENCOUNTER — Encounter: Payer: Self-pay | Admitting: Rehabilitative and Restorative Service Providers"

## 2019-05-07 ENCOUNTER — Ambulatory Visit: Payer: Medicare Other | Admitting: Rehabilitative and Restorative Service Providers"

## 2019-05-07 DIAGNOSIS — R262 Difficulty in walking, not elsewhere classified: Secondary | ICD-10-CM

## 2019-05-07 DIAGNOSIS — R2689 Other abnormalities of gait and mobility: Secondary | ICD-10-CM | POA: Diagnosis not present

## 2019-05-07 DIAGNOSIS — M25661 Stiffness of right knee, not elsewhere classified: Secondary | ICD-10-CM | POA: Diagnosis not present

## 2019-05-07 DIAGNOSIS — M25561 Pain in right knee: Secondary | ICD-10-CM

## 2019-05-07 DIAGNOSIS — R600 Localized edema: Secondary | ICD-10-CM

## 2019-05-07 DIAGNOSIS — M6281 Muscle weakness (generalized): Secondary | ICD-10-CM | POA: Diagnosis not present

## 2019-05-07 LAB — COMPLETE METABOLIC PANEL WITH GFR
AG Ratio: 1.7 (calc) (ref 1.0–2.5)
ALT: 18 U/L (ref 6–29)
AST: 15 U/L (ref 10–35)
Albumin: 4.2 g/dL (ref 3.6–5.1)
Alkaline phosphatase (APISO): 72 U/L (ref 37–153)
BUN: 21 mg/dL (ref 7–25)
CO2: 28 mmol/L (ref 20–32)
Calcium: 9.6 mg/dL (ref 8.6–10.4)
Chloride: 106 mmol/L (ref 98–110)
Creat: 0.82 mg/dL (ref 0.60–0.93)
GFR, Est African American: 83 mL/min/{1.73_m2} (ref >=60)
GFR, Est Non African American: 72 mL/min/{1.73_m2} (ref >=60)
Globulin: 2.5 g/dL (calc) (ref 1.9–3.7)
Glucose, Bld: 101 mg/dL — ABNORMAL HIGH (ref 65–99)
Potassium: 4.6 mmol/L (ref 3.5–5.3)
Sodium: 141 mmol/L (ref 135–146)
Total Bilirubin: 0.4 mg/dL (ref 0.2–1.2)
Total Protein: 6.7 g/dL (ref 6.1–8.1)

## 2019-05-07 LAB — CBC WITH DIFFERENTIAL/PLATELET
Absolute Monocytes: 577 cells/uL (ref 200–950)
Basophils Absolute: 52 cells/uL (ref 0–200)
Basophils Relative: 0.7 %
Eosinophils Absolute: 340 cells/uL (ref 15–500)
Eosinophils Relative: 4.6 %
HCT: 41.1 % (ref 35.0–45.0)
Hemoglobin: 13.8 g/dL (ref 11.7–15.5)
Lymphs Abs: 1643 cells/uL (ref 850–3900)
MCH: 32 pg (ref 27.0–33.0)
MCHC: 33.6 g/dL (ref 32.0–36.0)
MCV: 95.4 fL (ref 80.0–100.0)
MPV: 11.9 fL (ref 7.5–12.5)
Monocytes Relative: 7.8 %
Neutro Abs: 4788 cells/uL (ref 1500–7800)
Neutrophils Relative %: 64.7 %
Platelets: 214 10*3/uL (ref 140–400)
RBC: 4.31 10*6/uL (ref 3.80–5.10)
RDW: 12.2 % (ref 11.0–15.0)
Total Lymphocyte: 22.2 %
WBC: 7.4 10*3/uL (ref 3.8–10.8)

## 2019-05-07 LAB — HEMOGLOBIN A1C
Hgb A1c MFr Bld: 5.4 % of total Hgb (ref <5.7)
Mean Plasma Glucose: 108 (calc)
eAG (mmol/L): 6 (calc)

## 2019-05-07 LAB — VITAMIN D 25 HYDROXY (VIT D DEFICIENCY, FRACTURES): Vit D, 25-Hydroxy: 55 ng/mL (ref 30–100)

## 2019-05-07 LAB — LIPID PANEL
Cholesterol: 292 mg/dL — ABNORMAL HIGH (ref <200)
HDL: 87 mg/dL (ref >=50)
LDL Cholesterol (Calc): 144 mg/dL (calc) — ABNORMAL HIGH
Non-HDL Cholesterol (Calc): 205 mg/dL (calc) — ABNORMAL HIGH (ref <130)
Total CHOL/HDL Ratio: 3.4 (calc) (ref <5.0)
Triglycerides: 396 mg/dL — ABNORMAL HIGH (ref <150)

## 2019-05-07 LAB — MAGNESIUM: Magnesium: 2.1 mg/dL (ref 1.5–2.5)

## 2019-05-07 LAB — TSH: TSH: 1.76 mIU/L (ref 0.40–4.50)

## 2019-05-07 NOTE — Therapy (Signed)
[facility] [address] [place], [place], [address] Phone: [phone]   Fax:  [phone]  Physical Therapy Treatment  Patient Details  Name: Kathy Cook MRN: [id number] Date of Birth: [birth year] Referring Provider (PT): [name], MD   Encounter Date: 05/07/2019  PT End of Session - 05/07/19 1220    Visit Number  17    Number of Visits  18    Date for PT Re-Evaluation  05/15/19    Authorization Type  UHC MCR    PT Start Time  1143    PT Stop Time  1230    PT Time Calculation (min)  47 min    Activity Tolerance  Patient tolerated treatment well    Behavior During Therapy  [facility] for tasks assessed/performed       Past Medical History:  Diagnosis Date  . Allergy   . Anxiety   . Attention deficit disorder (ADD)   . Cancer [facility])    ? basal/squam cell on chest, and forehead  . Depression   . Dry eyes   . GERD (gastroesophageal reflux disease)   . HSV infection   . Lumbar degenerative disc disease   . Ocular rosacea   . Osteopenia     Past Surgical History:  Procedure Laterality Date  . SKIN BIOPSY     ?basal/squam cell on chest  . TONSILLECTOMY AND ADENOIDECTOMY    . TOTAL KNEE ARTHROPLASTY Right 03/04/2019   Procedure: RIGHT TOTAL KNEE ARTHROPLASTY;  Surgeon: [name], MD;  Location: [place];  Service: Orthopedics;  Laterality: Right;    There were no vitals filed for this visit.  Subjective Assessment - 05/07/19 1150    Subjective  Pt. indicated no pain upon arrival.  Feels stiff overall.    Pertinent History  PMH: Rt TKA 03/04/19, Ca,lumbar DDD, osteopenia,    How long can you sit comfortably?  20    How long can you stand comfortably?  5 min    Patient Stated Goals  get the pain down and get back to normsl    Currently in Pain?  No/denies    Pain Score  0-No pain    Pain Location  Knee    Pain Orientation  Right    Pain Descriptors / Indicators  Aching;Tightness    Pain Onset  More than a month ago                        [facility] Adult PT Treatment/Exercise - 05/07/19 0001      Knee/Hip Exercises: Stretches   Gastroc Stretch  Both;5 reps;30 seconds   incline board   Other Knee/Hip Stretches  TKE heel prop 5 mins      Knee/Hip Exercises: Aerobic   Recumbent Bike  Partial revolutions for ROM7 mins      Knee/Hip Exercises: Standing   Lateral Step Up  Both;2 sets;10 reps;Step Height: 4"      Knee/Hip Exercises: Seated   Long Arc Quad  Right;3 sets;10 reps    Long Arc Quad Weight  5 lbs.    Hamstring Curl  Strengthening;Right;3 sets;10 reps   green band     Knee/Hip Exercises: Supine   Heel Slides  AROM;Right;2 sets;10 reps   on exercise ball     Vasopneumatic   Number Minutes Vasopneumatic   6 minutes    Vasopnuematic Location   Knee    Vasopneumatic Pressure  Medium    Vasopneumatic Temperature  34      Manual Therapy   Manual therapy comments  g3 ap mobs proximal tibia for flexion, PROM, MET for flexion             PT Education - 05/07/19 1150    Education Details  Intervention cues prn.    Person(s) Educated  Patient    Methods  Explanation;Demonstration;Verbal cues    Comprehension  Verbalized understanding;Returned demonstration       PT Short Term Goals - 04/19/19 1426      PT SHORT TERM GOAL #1   Title  she will be indpendent with initial HEP    Time  4    Period  Weeks    Status  Achieved    Target Date  04/17/19      PT SHORT TERM GOAL #2   Title  She will report less than 5/10 overall pain    Time  4    Period  Weeks    Status  Achieved      PT SHORT TERM GOAL #3   Title  WIll improve knee ROM 10-100 to improve function.    Status  Achieved        PT Long Term Goals - 04/24/19 1244      PT LONG TERM GOAL #1   Title  She will be indpendent with all HEP issued. (target for all goals is 05/15/18)    Time  8    Period  Weeks    Status  On-going      PT LONG TERM GOAL #2   Title  Will reduce pain to overall less than  3/10 with ususal activity.    Status  On-going      PT LONG TERM GOAL #3   Title  Pt will be able to ambulate community distances no AD WFL gait pattern and be able to negotiate one flight of stairs reciprocally.    Time  8    Period  Weeks    Status  New      PT LONG TERM GOAL #4   Title  Pt will improve Rt knee AROM 3-115 to improve function.    Time  8    Period  Weeks    Status  On-going      PT LONG TERM GOAL #5   Title  Pt will increased Rt knee strength to 5/5 and Rt hip strength to 4+/5 MMT to improve function    Status  On-going            Plan - 05/07/19 1219    Clinical Impression Statement  Continued TKE resistance noted in mobility at this time.  Pt. demonstrated improved tolerance and quality for knee flexion during visit today from arrival to termination of visit.  Low load long duration stretching advised for extension mobility.    Personal Factors and Comorbidities  Comorbidity 3+    Comorbidities  PMH: Rt TKA 03/04/19, Ca,lumbar DDD, osteopenia,    Examination-Activity Limitations  Squat;Stairs;Stand;Lift;Dressing;Locomotion Level;Transfers    Examination-Participation Restrictions  Cleaning;Meal Prep;Community Activity;Driving;Laundry;Shop    Stability/Clinical Decision Making  Evolving/Moderate complexity    Rehab Potential  Good    PT Frequency  3x / week    PT Duration  8 weeks    PT Treatment/Interventions  ADLs/Self Care Home Management;Cryotherapy;Moist Heat;Gait training;Stair training;Therapeutic activities;Therapeutic exercise;Balance training;Neuromuscular re-education;Manual techniques;Passive range of motion;Dry needling;Joint Manipulations;Taping;Vasopneumatic Device    PT Next Visit Plan  Low load long duration extension stretching, WB strengthening for  functional activity including stairs.    PT Home Exercise Plan  Access Code: [facility]    Consulted and Agree with Plan of Care  Patient       Patient will benefit from skilled therapeutic  intervention in order to improve the following deficits and impairments:  Abnormal gait, Decreased activity tolerance, Decreased balance, Decreased mobility, Decreased endurance, Decreased range of motion, Decreased strength, Increased edema, Difficulty walking, Increased fascial restricitons, Increased muscle spasms, Impaired flexibility, Pain  Visit Diagnosis: Acute pain of right knee  Stiffness of right knee, not elsewhere classified  Other abnormalities of gait and mobility  Muscle weakness (generalized)  Localized edema  Difficulty in walking, not elsewhere classified     Problem List Patient Active Problem List   Diagnosis Date Noted  . Status post total hip replacement, right 03/05/2019  . Arthritis of right knee 03/04/2019  . Unilateral primary osteoarthritis, right knee 02/05/2019  . Knee locking, right 01/09/2019  . Metatarsal stress fracture, right, initial encounter 10/03/2016  . DJD (degenerative joint disease) 06/09/2016  . Labile hypertension 08/01/2014  . Vitamin D deficiency 08/01/2014  . Medication management 08/01/2014  . Obesity 07/04/2014  . Hyperlipemia 06/27/2013  . Attention deficit disorder (ADD)   . GERD (gastroesophageal reflux disease)   . Allergy   . Depression, major, in remission ([place])   . Anemia   . Ocular rosacea   . HSV infection   . Osteopenia    Scot Jun, PT, DPT, OCS, ATC 05/07/19  12:30 PM    [facility] [address] [place], [place], [address] Phone: [phone]   Fax:  [phone]  Name: Kathy Cook MRN: [id number] Date of Birth: 1945-05-11

## 2019-05-09 ENCOUNTER — Ambulatory Visit: Payer: Medicare Other | Admitting: Rehabilitative and Restorative Service Providers"

## 2019-05-09 ENCOUNTER — Other Ambulatory Visit: Payer: Self-pay

## 2019-05-09 ENCOUNTER — Encounter: Payer: Self-pay | Admitting: Rehabilitative and Restorative Service Providers"

## 2019-05-09 ENCOUNTER — Other Ambulatory Visit: Payer: Self-pay | Admitting: Physician Assistant

## 2019-05-09 DIAGNOSIS — R262 Difficulty in walking, not elsewhere classified: Secondary | ICD-10-CM

## 2019-05-09 DIAGNOSIS — R2689 Other abnormalities of gait and mobility: Secondary | ICD-10-CM

## 2019-05-09 DIAGNOSIS — M6281 Muscle weakness (generalized): Secondary | ICD-10-CM

## 2019-05-09 DIAGNOSIS — M25561 Pain in right knee: Secondary | ICD-10-CM | POA: Diagnosis not present

## 2019-05-09 DIAGNOSIS — R600 Localized edema: Secondary | ICD-10-CM | POA: Diagnosis not present

## 2019-05-09 DIAGNOSIS — M25661 Stiffness of right knee, not elsewhere classified: Secondary | ICD-10-CM | POA: Diagnosis not present

## 2019-05-09 DIAGNOSIS — N6320 Unspecified lump in the left breast, unspecified quadrant: Secondary | ICD-10-CM

## 2019-05-09 NOTE — Therapy (Signed)
[facility] [address] [place], [place], [address] Phone: [phone]   Fax:  [phone]  Physical Therapy Treatment  Patient Details  Name: Kathy Cook MRN: [id number] Date of Birth: [birth year] Referring Provider (PT): [name], MD   Encounter Date: 05/09/2019  PT End of Session - 05/09/19 1225    Visit Number  18    Number of Visits  18    Date for PT Re-Evaluation  05/15/19    Authorization Type  UHC MCR    PT Start Time  0938    PT Stop Time  1228    PT Time Calculation (min)  43 min    Activity Tolerance  Patient tolerated treatment well    Behavior During Therapy  [facility] for tasks assessed/performed       Past Medical History:  Diagnosis Date  . Allergy   . Anxiety   . Attention deficit disorder (ADD)   . Cancer [facility])    ? basal/squam cell on chest, and forehead  . Depression   . Dry eyes   . GERD (gastroesophageal reflux disease)   . HSV infection   . Lumbar degenerative disc disease   . Ocular rosacea   . Osteopenia     Past Surgical History:  Procedure Laterality Date  . SKIN BIOPSY     ?basal/squam cell on chest  . TONSILLECTOMY AND ADENOIDECTOMY    . TOTAL KNEE ARTHROPLASTY Right 03/04/2019   Procedure: RIGHT TOTAL KNEE ARTHROPLASTY;  Surgeon: [name], MD;  Location: [place];  Service: Orthopedics;  Laterality: Right;    There were no vitals filed for this visit.  Subjective Assessment - 05/09/19 1152    Subjective  Pt. stated no pain at rest.  Pt. indicated soreness at times < 1/10.  Pain c end ranges.    Pertinent History  PMH: Rt TKA 03/04/19, Ca,lumbar DDD, osteopenia,    How long can you sit comfortably?  20    How long can you stand comfortably?  5 min    Patient Stated Goals  get the pain down and get back to normsl    Pain Score  0-No pain    Pain Location  Knee    Pain Orientation  Right    Pain Descriptors / Indicators  Aching;Sore    Pain Onset  More than a month ago                        [facility] Adult PT Treatment/Exercise - 05/09/19 0001      Knee/Hip Exercises: Stretches   Gastroc Stretch  Both;5 reps;30 seconds      Knee/Hip Exercises: Standing   Forward Step Up  Right;2 sets;10 reps;Step Height: 6"    Other Standing Knee Exercises  standing TKE purple band 20 x    Other Standing Knee Exercises  lateral stepping 3 cones x 10 bilaterally      Knee/Hip Exercises: Supine   Other Supine Knee/Hip Exercises  supine passive TKE c compression device 5 mins      Vasopneumatic   Number Minutes Vasopneumatic   5 minutes    Vasopnuematic Location   Knee    Vasopneumatic Pressure  Medium    Vasopneumatic Temperature   34      Manual Therapy   Manual therapy comments  g3 ap mobs proximal tibia for flexion, PROM, MET for flexion             PT Education -  05/09/19 1153    Education Details  Intervention cues.    Person(s) Educated  Patient    Methods  Explanation;Demonstration    Comprehension  Verbalized understanding       PT Short Term Goals - 04/19/19 1426      PT SHORT TERM GOAL #1   Title  she will be indpendent with initial HEP    Time  4    Period  Weeks    Status  Achieved    Target Date  04/17/19      PT SHORT TERM GOAL #2   Title  She will report less than 5/10 overall pain    Time  4    Period  Weeks    Status  Achieved      PT SHORT TERM GOAL #3   Title  WIll improve knee ROM 10-100 to improve function.    Status  Achieved        PT Long Term Goals - 04/24/19 1244      PT LONG TERM GOAL #1   Title  She will be indpendent with all HEP issued. (target for all goals is 05/15/18)    Time  8    Period  Weeks    Status  On-going      PT LONG TERM GOAL #2   Title  Will reduce pain to overall less than 3/10 with ususal activity.    Status  On-going      PT LONG TERM GOAL #3   Title  Pt will be able to ambulate community distances no AD WFL gait pattern and be able to negotiate one flight of stairs  reciprocally.    Time  8    Period  Weeks    Status  New      PT LONG TERM GOAL #4   Title  Pt will improve Rt knee AROM 3-115 to improve function.    Time  8    Period  Weeks    Status  On-going      PT LONG TERM GOAL #5   Title  Pt will increased Rt knee strength to 5/5 and Rt hip strength to 4+/5 MMT to improve function    Status  On-going            Plan - 05/09/19 1223    Clinical Impression Statement  Pt. continues to present c increased resistance/symptoms c terminal knee extension which impairs gait quality from normal.  Movement coordination in SL activity fair at this time but able to be improved.  Continued skilled PT services and guided HEP indicated to continue to improve strength, mobility and balance.    Personal Factors and Comorbidities  Comorbidity 3+    Comorbidities  PMH: Rt TKA 03/04/19, Ca,lumbar DDD, osteopenia,    Examination-Activity Limitations  Squat;Stairs;Stand;Lift;Dressing;Locomotion Level;Transfers    Examination-Participation Restrictions  Cleaning;Meal Prep;Community Activity;Driving;Laundry;Shop    Stability/Clinical Decision Making  Evolving/Moderate complexity    Rehab Potential  Good    PT Frequency  3x / week    PT Duration  8 weeks    PT Treatment/Interventions  ADLs/Self Care Home Management;Cryotherapy;Moist Heat;Gait training;Stair training;Therapeutic activities;Therapeutic exercise;Balance training;Neuromuscular re-education;Manual techniques;Passive range of motion;Dry needling;Joint Manipulations;Taping;Vasopneumatic Device    PT Next Visit Plan  Low load long duration extension stretching, stairs, squats, SLS    PT Home Exercise Plan  Access Code: [facility]    Consulted and Agree with Plan of Care  Patient       Patient will benefit  from skilled therapeutic intervention in order to improve the following deficits and impairments:  Abnormal gait, Decreased activity tolerance, Decreased balance, Decreased mobility, Decreased endurance,  Decreased range of motion, Decreased strength, Increased edema, Difficulty walking, Increased fascial restricitons, Increased muscle spasms, Impaired flexibility, Pain  Visit Diagnosis: Acute pain of right knee  Stiffness of right knee, not elsewhere classified  Other abnormalities of gait and mobility  Muscle weakness (generalized)  Localized edema  Difficulty in walking, not elsewhere classified     Problem List Patient Active Problem List   Diagnosis Date Noted  . Status post total hip replacement, right 03/05/2019  . Arthritis of right knee 03/04/2019  . Unilateral primary osteoarthritis, right knee 02/05/2019  . Knee locking, right 01/09/2019  . Metatarsal stress fracture, right, initial encounter 10/03/2016  . DJD (degenerative joint disease) 06/09/2016  . Labile hypertension 08/01/2014  . Vitamin D deficiency 08/01/2014  . Medication management 08/01/2014  . Obesity 07/04/2014  . Hyperlipemia 06/27/2013  . Attention deficit disorder (ADD)   . GERD (gastroesophageal reflux disease)   . Allergy   . Depression, major, in remission ([place])   . Anemia   . Ocular rosacea   . HSV infection   . Osteopenia     Scot Jun, PT, DPT, OCS, ATC 05/09/19  12:26 PM    [facility] [address] [place], [place], [address] Phone: [phone]   Fax:  [phone]  Name: TUCKER STEEDLEY MRN: [id number] Date of Birth: January 21, 1946

## 2019-05-20 ENCOUNTER — Other Ambulatory Visit: Payer: Self-pay

## 2019-05-20 ENCOUNTER — Ambulatory Visit (INDEPENDENT_AMBULATORY_CARE_PROVIDER_SITE_OTHER): Payer: Medicare Other | Admitting: Physical Therapy

## 2019-05-20 DIAGNOSIS — M6281 Muscle weakness (generalized): Secondary | ICD-10-CM | POA: Diagnosis not present

## 2019-05-20 DIAGNOSIS — M25561 Pain in right knee: Secondary | ICD-10-CM

## 2019-05-20 DIAGNOSIS — M25661 Stiffness of right knee, not elsewhere classified: Secondary | ICD-10-CM

## 2019-05-20 DIAGNOSIS — R600 Localized edema: Secondary | ICD-10-CM | POA: Diagnosis not present

## 2019-05-20 DIAGNOSIS — R2689 Other abnormalities of gait and mobility: Secondary | ICD-10-CM | POA: Diagnosis not present

## 2019-05-20 NOTE — Therapy (Signed)
[facility] Physical Therapy [address] [place], [place], [address] Phone: [phone]   Fax:  [phone]  Physical Therapy Treatment/Recert/Progress note Progress Note reporting period 04/19/19 to 05/20/19  See below for objective and subjective measurements relating to patients progress with PT.   Patient Details  Name: Kathy Cook MRN: [id number] Date of Birth: [birth year] Referring Provider (PT): [name], MD   Encounter Date: 05/20/2019  PT End of Session - 05/20/19 1226    Visit Number  19    Number of Visits  27   recert on [id number] to extend POC   Date for PT Re-Evaluation  06/17/19    Authorization Type  UHC MCR    PT Start Time  1105    PT Stop Time  1155    PT Time Calculation (min)  50 min    Activity Tolerance  Patient tolerated treatment well    Behavior During Therapy  [facility] for tasks assessed/performed       Past Medical History:  Diagnosis Date  . Allergy   . Anxiety   . Attention deficit disorder (ADD)   . Cancer [facility])    ? basal/squam cell on chest, and forehead  . Depression   . Dry eyes   . GERD (gastroesophageal reflux disease)   . HSV infection   . Lumbar degenerative disc disease   . Ocular rosacea   . Osteopenia     Past Surgical History:  Procedure Laterality Date  . SKIN BIOPSY     ?basal/squam cell on chest  . TONSILLECTOMY AND ADENOIDECTOMY    . TOTAL KNEE ARTHROPLASTY Right 03/04/2019   Procedure: RIGHT TOTAL KNEE ARTHROPLASTY;  Surgeon: [name], MD;  Location: [place];  Service: Orthopedics;  Laterality: Right;    There were no vitals filed for this visit.  Subjective Assessment - 05/20/19 1214    Subjective  She was on vacation the last 2 weeks in [place], had a difficult time with the car ride, no knee pain but continued stiffness that is aggravating    Pertinent History  PMH: Rt TKA 03/04/19, Ca,lumbar DDD, osteopenia,    How long can you sit comfortably?  20    How long can you stand comfortably?   5 min    Patient Stated Goals  get the pain down and get back to normsl    Pain Onset  More than a month ago         [facility] PT Assessment - 05/20/19 0001      Assessment   Medical Diagnosis  Rt TKA    Referring Provider (PT)  [name], MD    Onset Date/Surgical Date  03/04/19      AROM   Right Knee Extension  -10    Right Knee Flexion  100      PROM   Right Knee Extension  -5    Right Knee Flexion  104      Strength   Overall Strength Comments  Rt hip strength overall 4+/5, Rt knee strength 5/5                   [facility] Adult PT Treatment/Exercise - 05/20/19 0001      Knee/Hip Exercises: Stretches   Active Hamstring Stretch  Right;3 reps;30 seconds    Knee: Self-Stretch Limitations  lunge stretch 20sec X 15reps with foot in chair. Seated knee felxion with self O.P from other leg contract-relax stretching 10 sec X 10 reps  Gastroc Stretch  Both;3 reps;30 seconds      Knee/Hip Exercises: Aerobic   Nustep  L7 X 10 min for knee ROM, seat 8      Knee/Hip Exercises: Standing   Forward Step Up Limitations  Rt side 15 reps stepping up onto 8 inch step with Rt leg then stepping down with Lt leg on other side. with bilat UE support    Functional Squat Limitations  TRX deep squat with 2 sec hold, X 10 reps      Vasopneumatic   Number Minutes Vasopneumatic   10 minutes    Vasopnuematic Location   Knee    Vasopneumatic Pressure  Medium    Vasopneumatic Temperature   34      Manual Therapy   Manual therapy comments  g3-5 mobs for flexion and extension, PROM, manual hamstring stretching               PT Short Term Goals - 04/19/19 1426      PT SHORT TERM GOAL #1   Title  she will be indpendent with initial HEP    Time  4    Period  Weeks    Status  Achieved    Target Date  04/17/19      PT SHORT TERM GOAL #2   Title  She will report less than 5/10 overall pain    Time  4    Period  Weeks    Status  Achieved      PT SHORT TERM GOAL #3   Title   WIll improve knee ROM 10-100 to improve function.    Status  Achieved        PT Long Term Goals - 05/20/19 1228      PT LONG TERM GOAL #1   Title  She will be indpendent with all HEP issued. (target for all goals is 05/15/18)    Time  8    Period  Weeks    Status  On-going      PT LONG TERM GOAL #2   Title  Will reduce pain to overall less than 3/10 with ususal activity.    Status  Achieved      PT LONG TERM GOAL #3   Title  Pt will be able to ambulate community distances no AD WFL gait pattern and be able to negotiate one flight of stairs reciprocally.    Time  8    Period  Weeks    Status  Achieved      PT LONG TERM GOAL #4   Title  Pt will improve Rt knee AROM 3-115 to improve function.    Time  8    Period  Weeks    Status  On-going      PT LONG TERM GOAL #5   Title  Pt will increased Rt knee strength to 5/5 and Rt hip strength to 4+/5 MMT to improve function    Status  Achieved            Plan - 05/20/19 1227    Clinical Impression Statement  She is doing well with strength and ambulation/activity tolerance but continues to lack knee ROM affecting her gait and funcitonal abilities. She will continue to benefit from skilled PT with focus on knee ROM.    Personal Factors and Comorbidities  Comorbidity 3+    Comorbidities  PMH: Rt TKA 03/04/19, Ca,lumbar DDD, osteopenia,    Examination-Activity Limitations  Squat;Stairs;Stand;Lift;Dressing;Locomotion Level;Transfers    Examination-Participation Restrictions  Cleaning;Meal Prep;Community Activity;Driving;Laundry;Shop    Stability/Clinical Decision Making  Evolving/Moderate complexity    Rehab Potential  Good    PT Frequency  3x / week    PT Duration  8 weeks    PT Treatment/Interventions  ADLs/Self Care Home Management;Cryotherapy;Moist Heat;Gait training;Stair training;Therapeutic activities;Therapeutic exercise;Balance training;Neuromuscular re-education;Manual techniques;Passive range of motion;Dry needling;Joint  Manipulations;Taping;Vasopneumatic Device    PT Next Visit Plan  Low load long duration extension stretching, stairs, squats, SLS    PT Home Exercise Plan  Access Code: [facility]    Consulted and Agree with Plan of Care  Patient       Patient will benefit from skilled therapeutic intervention in order to improve the following deficits and impairments:  Abnormal gait, Decreased activity tolerance, Decreased balance, Decreased mobility, Decreased endurance, Decreased range of motion, Decreased strength, Increased edema, Difficulty walking, Increased fascial restricitons, Increased muscle spasms, Impaired flexibility, Pain  Visit Diagnosis: Acute pain of right knee  Stiffness of right knee, not elsewhere classified  Other abnormalities of gait and mobility  Muscle weakness (generalized)  Localized edema     Problem List Patient Active Problem List   Diagnosis Date Noted  . Status post total hip replacement, right 03/05/2019  . Arthritis of right knee 03/04/2019  . Unilateral primary osteoarthritis, right knee 02/05/2019  . Knee locking, right 01/09/2019  . Metatarsal stress fracture, right, initial encounter 10/03/2016  . DJD (degenerative joint disease) 06/09/2016  . Labile hypertension 08/01/2014  . Vitamin D deficiency 08/01/2014  . Medication management 08/01/2014  . Obesity 07/04/2014  . Hyperlipemia 06/27/2013  . Attention deficit disorder (ADD)   . GERD (gastroesophageal reflux disease)   . Allergy   . Depression, major, in remission ([place])   . Anemia   . Ocular rosacea   . HSV infection   . Osteopenia     Kathy Cook, PT,DPT 05/20/2019, 1:23 PM  [facility] Physical Therapy [address] [place], [place], [address] Phone: [phone]   Fax:  [phone]  Name: Kathy Cook MRN: [id number] Date of Birth: October 26, 1945

## 2019-05-22 ENCOUNTER — Other Ambulatory Visit: Payer: Self-pay

## 2019-05-22 ENCOUNTER — Encounter: Payer: Self-pay | Admitting: Physical Therapy

## 2019-05-22 ENCOUNTER — Ambulatory Visit (INDEPENDENT_AMBULATORY_CARE_PROVIDER_SITE_OTHER): Payer: Medicare Other | Admitting: Physical Therapy

## 2019-05-22 DIAGNOSIS — R600 Localized edema: Secondary | ICD-10-CM

## 2019-05-22 DIAGNOSIS — M6281 Muscle weakness (generalized): Secondary | ICD-10-CM | POA: Diagnosis not present

## 2019-05-22 DIAGNOSIS — R2689 Other abnormalities of gait and mobility: Secondary | ICD-10-CM

## 2019-05-22 DIAGNOSIS — M25661 Stiffness of right knee, not elsewhere classified: Secondary | ICD-10-CM

## 2019-05-22 DIAGNOSIS — M25561 Pain in right knee: Secondary | ICD-10-CM

## 2019-05-22 NOTE — Therapy (Signed)
[facility] Physical Therapy [address] [place], [place], [address] Phone: [phone]   Fax:  [phone]  Physical Therapy Treatment  Patient Details  Name: Kathy Cook MRN: [id number] Date of Birth: September 16, 1945 Referring Provider (PT): [name], MD   Encounter Date: 05/22/2019  PT End of Session - 05/22/19 1206    Visit Number  20    Number of Visits  27   recert on [id number] to extend [place]   Date for PT Re-Evaluation  06/17/19    Authorization Type  [employer] MCR, last progress note done visit 58.    PT Start Time  1100    PT Stop Time  1150    PT Time Calculation (min)  50 min    Activity Tolerance  Patient tolerated treatment well    Behavior During Therapy  WFL for tasks assessed/performed       Past Medical History:  Diagnosis Date  . Allergy   . Anxiety   . Attention deficit disorder (ADD)   . Cancer [facility])    ? basal/squam cell on chest, and forehead  . Depression   . Dry eyes   . GERD (gastroesophageal reflux disease)   . HSV infection   . Lumbar degenerative disc disease   . Ocular rosacea   . Osteopenia     Past Surgical History:  Procedure Laterality Date  . SKIN BIOPSY     ?basal/squam cell on chest  . TONSILLECTOMY AND ADENOIDECTOMY    . TOTAL KNEE ARTHROPLASTY Right 03/04/2019   Procedure: RIGHT TOTAL KNEE ARTHROPLASTY;  Surgeon: [name], MD;  Location: [place];  Service: Orthopedics;  Laterality: Right;    There were no vitals filed for this visit.  Subjective Assessment - 05/22/19 1148    Subjective  no pain just stiffness that is aggravating    Pertinent History  PMH: Rt TKA 03/04/19, Ca,lumbar DDD, osteopenia,    How long can you sit comfortably?  20    How long can you stand comfortably?  5 min    Patient Stated Goals  get the pain down and get back to normsl    Currently in Pain?  No/denies    Pain Onset  More than a month ago      [facility] Adult PT Treatment/Exercise - 05/22/19 0001      Knee/Hip Exercises:  Stretches   Active Hamstring Stretch  Right;3 reps;30 seconds    Active Hamstring Stretch Limitations  seated    Knee: Self-Stretch Limitations  seated heelslides with self OP contract relax 10 sec X 10 rpes    Gastroc Stretch  Both;3 reps;30 seconds    Other Knee/Hip Stretches  quadriped child pose stretch for knee flexion stretching on low mat table 30 sec X 3    Other Knee/Hip Stretches  prone hang 5 min with STM, interspaced with manual quad stretching       Knee/Hip Exercises: Aerobic   Nustep  L7 X 10 min for knee ROM, seat 8      Knee/Hip Exercises: Machines for Strengthening   Total Gym Leg Press  bilat push 87 lbs 2X15, then dropped to 75 lbs for Rt leg only push 2X10      Knee/Hip Exercises: Standing   Forward Step Up Limitations  Rt side 15 reps stepping up onto 8 inch step with Rt leg then stepping down with Lt leg on other side. with bilat UE support      Vasopneumatic   Number Minutes Vasopneumatic  10 minutes    Vasopnuematic Location   Knee    Vasopneumatic Pressure  Medium    Vasopneumatic Temperature   34      Manual Therapy   Manual therapy comments  g3-5 mobs for flexion and extension, PROM, manual hamstring stretching and quad stretching in prone, STM to posterior leg in prone hang               PT Short Term Goals - 04/19/19 1426      PT SHORT TERM GOAL #1   Title  she will be indpendent with initial HEP    Time  4    Period  Weeks    Status  Achieved    Target Date  04/17/19      PT SHORT TERM GOAL #2   Title  She will report less than 5/10 overall pain    Time  4    Period  Weeks    Status  Achieved      PT SHORT TERM GOAL #3   Title  WIll improve knee ROM 10-100 to improve function.    Status  Achieved        PT Long Term Goals - 05/20/19 1228      PT LONG TERM GOAL #1   Title  She will be indpendent with all HEP issued. (target for all goals is 05/15/18)    Time  8    Period  Weeks    Status  On-going      PT LONG TERM GOAL  #2   Title  Will reduce pain to overall less than 3/10 with ususal activity.    Status  Achieved      PT LONG TERM GOAL #3   Title  Pt will be able to ambulate community distances no AD WFL gait pattern and be able to negotiate one flight of stairs reciprocally.    Time  8    Period  Weeks    Status  Achieved      PT LONG TERM GOAL #4   Title  Pt will improve Rt knee AROM 3-115 to improve function.    Time  8    Period  Weeks    Status  On-going      PT LONG TERM GOAL #5   Title  Pt will increased Rt knee strength to 5/5 and Rt hip strength to 4+/5 MMT to improve function    Status  Achieved            Plan - 05/22/19 1206    Clinical Impression Statement  Added quadriped stretching for knee flexion ROM today and this was added to HEP. She is overall doing well but continues to lack knee ROM affecting her overall gait.PT will progress this as able    Personal Factors and Comorbidities  Comorbidity 3+    Comorbidities  PMH: Rt TKA 03/04/19, Ca,lumbar DDD, osteopenia,    Examination-Activity Limitations  Squat;Stairs;Stand;Lift;Dressing;Locomotion Level;Transfers    Examination-Participation Restrictions  Cleaning;Meal Prep;Community Activity;Driving;Laundry;Shop    Stability/Clinical Decision Making  Evolving/Moderate complexity    Rehab Potential  Good    PT Frequency  3x / week    PT Duration  8 weeks    PT Treatment/Interventions  ADLs/Self Care Home Management;Cryotherapy;Moist Heat;Gait training;Stair training;Therapeutic activities;Therapeutic exercise;Balance training;Neuromuscular re-education;Manual techniques;Passive range of motion;Dry needling;Joint Manipulations;Taping;Vasopneumatic Device    PT Next Visit Plan  Low load long duration extension stretching, stairs, squats, SLS    PT Home Exercise Plan  Access  Code: [facility]    Consulted and Agree with Plan of Care  Patient       Patient will benefit from skilled therapeutic intervention in order to improve the  following deficits and impairments:  Abnormal gait, Decreased activity tolerance, Decreased balance, Decreased mobility, Decreased endurance, Decreased range of motion, Decreased strength, Increased edema, Difficulty walking, Increased fascial restricitons, Increased muscle spasms, Impaired flexibility, Pain  Visit Diagnosis: Acute pain of right knee  Stiffness of right knee, not elsewhere classified  Other abnormalities of gait and mobility  Muscle weakness (generalized)  Localized edema     Problem List Patient Active Problem List   Diagnosis Date Noted  . Status post total hip replacement, right 03/05/2019  . Arthritis of right knee 03/04/2019  . Unilateral primary osteoarthritis, right knee 02/05/2019  . Knee locking, right 01/09/2019  . Metatarsal stress fracture, right, initial encounter 10/03/2016  . DJD (degenerative joint disease) 06/09/2016  . Labile hypertension 08/01/2014  . Vitamin D deficiency 08/01/2014  . Medication management 08/01/2014  . Obesity 07/04/2014  . Hyperlipemia 06/27/2013  . Attention deficit disorder (ADD)   . GERD (gastroesophageal reflux disease)   . Allergy   . Depression, major, in remission ([place])   . Anemia   . Ocular rosacea   . HSV infection   . Osteopenia     Debbe Odea, PT,DPT 05/22/2019, 12:21 PM  [facility] Physical Therapy [address] [place], [place], [address] Phone: [phone]   Fax:  [phone]  Name: Kathy Cook MRN: [id number] Date of Birth: September 06, 1945

## 2019-05-24 ENCOUNTER — Ambulatory Visit
Admission: RE | Admit: 2019-05-24 | Discharge: 2019-05-24 | Disposition: A | Discharge status: Home / Self Care | Payer: Medicare Other | Source: Ambulatory Visit | Attending: Physician Assistant | Admitting: Physician Assistant

## 2019-05-24 ENCOUNTER — Other Ambulatory Visit: Payer: Self-pay

## 2019-05-24 ENCOUNTER — Other Ambulatory Visit: Payer: Self-pay | Admitting: Physician Assistant

## 2019-05-24 ENCOUNTER — Ambulatory Visit: Payer: Medicare Other | Admitting: Physical Therapy

## 2019-05-24 DIAGNOSIS — N6320 Unspecified lump in the left breast, unspecified quadrant: Secondary | ICD-10-CM

## 2019-05-24 DIAGNOSIS — R600 Localized edema: Secondary | ICD-10-CM | POA: Diagnosis not present

## 2019-05-24 DIAGNOSIS — R2689 Other abnormalities of gait and mobility: Secondary | ICD-10-CM

## 2019-05-24 DIAGNOSIS — N6321 Unspecified lump in the left breast, upper outer quadrant: Secondary | ICD-10-CM | POA: Diagnosis not present

## 2019-05-24 DIAGNOSIS — M6281 Muscle weakness (generalized): Secondary | ICD-10-CM

## 2019-05-24 DIAGNOSIS — M25661 Stiffness of right knee, not elsewhere classified: Secondary | ICD-10-CM | POA: Diagnosis not present

## 2019-05-24 DIAGNOSIS — R922 Inconclusive mammogram: Secondary | ICD-10-CM | POA: Diagnosis not present

## 2019-05-24 DIAGNOSIS — M25561 Pain in right knee: Secondary | ICD-10-CM | POA: Diagnosis not present

## 2019-05-24 NOTE — Therapy (Signed)
[facility] Physical Therapy [address] [place], [place], [address] Phone: [phone]   Fax:  [phone]  Physical Therapy Treatment  Patient Details  Name: Kathy Cook MRN: [id number] Date of Birth: 09-01-1945 Referring Provider (PT): [name], MD   Encounter Date: 05/24/2019  PT End of Session - 05/24/19 1239    Visit Number  21    Number of Visits  27   recert on [id number] to extend [place]   Date for PT Re-Evaluation  06/17/19    Authorization Type  [employer] MCR, last progress note done visit 13.    PT Start Time  1145    PT Stop Time  1230    PT Time Calculation (min)  45 min    Activity Tolerance  Patient tolerated treatment well    Behavior During Therapy  WFL for tasks assessed/performed       Past Medical History:  Diagnosis Date  . Allergy   . Anxiety   . Attention deficit disorder (ADD)   . Cancer [facility])    ? basal/squam cell on chest, and forehead  . Depression   . Dry eyes   . GERD (gastroesophageal reflux disease)   . HSV infection   . Lumbar degenerative disc disease   . Ocular rosacea   . Osteopenia     Past Surgical History:  Procedure Laterality Date  . SKIN BIOPSY     ?basal/squam cell on chest  . TONSILLECTOMY AND ADENOIDECTOMY    . TOTAL KNEE ARTHROPLASTY Right 03/04/2019   Procedure: RIGHT TOTAL KNEE ARTHROPLASTY;  Surgeon: [name], MD;  Location: [place];  Service: Orthopedics;  Laterality: Right;    There were no vitals filed for this visit.  Subjective Assessment - 05/24/19 1238    Subjective  no pain just feels tight in my Rt knee    Pertinent History  PMH: Rt TKA 03/04/19, Ca,lumbar DDD, osteopenia,    How long can you sit comfortably?  20    How long can you stand comfortably?  5 min    Patient Stated Goals  get the pain down and get back to normsl    Pain Onset  More than a month ago                       [facility] Adult PT Treatment/Exercise - 05/24/19 0001      Knee/Hip Exercises: Stretches    Active Hamstring Stretch  Right;3 reps;30 seconds    Active Hamstring Stretch Limitations  standing    Knee: Self-Stretch Limitations  seated heelslides with self OP contract relax 10 sec X 10 rpes    Gastroc Stretch  Both;3 reps;30 seconds    Other Knee/Hip Stretches  prone hang 5 min with STM, interspaced with manual quad stretching       Knee/Hip Exercises: Aerobic   Nustep  10 min L7, seat 8      Knee/Hip Exercises: Machines for Strengthening   Total Gym Leg Press  bilat push 87 lbs 2X15, then dropped to 75 lbs for Rt leg only push 2X10      Knee/Hip Exercises: Standing   Terminal Knee Extension  Right;2 sets;10 reps    Theraband Level (Terminal Knee Extension)  Level 4 (Blue)      Manual Therapy   Manual therapy comments  g3-5 mobs for flexion and extension, PROM, manual hamstring stretching and quad stretching in prone, STM to posterior leg in prone hang  PT Short Term Goals - 04/19/19 1426      PT SHORT TERM GOAL #1   Title  she will be indpendent with initial HEP    Time  4    Period  Weeks    Status  Achieved    Target Date  04/17/19      PT SHORT TERM GOAL #2   Title  She will report less than 5/10 overall pain    Time  4    Period  Weeks    Status  Achieved      PT SHORT TERM GOAL #3   Title  WIll improve knee ROM 10-100 to improve function.    Status  Achieved        PT Long Term Goals - 05/20/19 1228      PT LONG TERM GOAL #1   Title  She will be indpendent with all HEP issued. (target for all goals is 05/15/18)    Time  8    Period  Weeks    Status  On-going      PT LONG TERM GOAL #2   Title  Will reduce pain to overall less than 3/10 with ususal activity.    Status  Achieved      PT LONG TERM GOAL #3   Title  Pt will be able to ambulate community distances no AD WFL gait pattern and be able to negotiate one flight of stairs reciprocally.    Time  8    Period  Weeks    Status  Achieved      PT LONG TERM GOAL #4   Title   Pt will improve Rt knee AROM 3-115 to improve function.    Time  8    Period  Weeks    Status  On-going      PT LONG TERM GOAL #5   Title  Pt will increased Rt knee strength to 5/5 and Rt hip strength to 4+/5 MMT to improve function    Status  Achieved            Plan - 05/24/19 1239    Clinical Impression Statement  Continued with agressive stretching and encouraged her to hold stretches longer at home as her knee ROM limitation feels more muscular vs joint now.    Personal Factors and Comorbidities  Comorbidity 3+    Comorbidities  PMH: Rt TKA 03/04/19, Ca,lumbar DDD, osteopenia,    Examination-Activity Limitations  Squat;Stairs;Stand;Lift;Dressing;Locomotion Level;Transfers    Examination-Participation Restrictions  Cleaning;Meal Prep;Community Activity;Driving;Laundry;Shop    Stability/Clinical Decision Making  Evolving/Moderate complexity    Rehab Potential  Good    PT Frequency  3x / week    PT Duration  8 weeks    PT Treatment/Interventions  ADLs/Self Care Home Management;Cryotherapy;Moist Heat;Gait training;Stair training;Therapeutic activities;Therapeutic exercise;Balance training;Neuromuscular re-education;Manual techniques;Passive range of motion;Dry needling;Joint Manipulations;Taping;Vasopneumatic Device    PT Next Visit Plan  Low load long duration extension stretching, stairs, squats, SLS    PT Home Exercise Plan  Access Code: [facility]    Consulted and Agree with Plan of Care  Patient       Patient will benefit from skilled therapeutic intervention in order to improve the following deficits and impairments:  Abnormal gait, Decreased activity tolerance, Decreased balance, Decreased mobility, Decreased endurance, Decreased range of motion, Decreased strength, Increased edema, Difficulty walking, Increased fascial restricitons, Increased muscle spasms, Impaired flexibility, Pain  Visit Diagnosis: Acute pain of right knee  Stiffness of right knee, not elsewhere  classified  Other abnormalities of gait and mobility  Muscle weakness (generalized)  Localized edema     Problem List Patient Active Problem List   Diagnosis Date Noted  . Status post total hip replacement, right 03/05/2019  . Arthritis of right knee 03/04/2019  . Unilateral primary osteoarthritis, right knee 02/05/2019  . Knee locking, right 01/09/2019  . Metatarsal stress fracture, right, initial encounter 10/03/2016  . DJD (degenerative joint disease) 06/09/2016  . Labile hypertension 08/01/2014  . Vitamin D deficiency 08/01/2014  . Medication management 08/01/2014  . Obesity 07/04/2014  . Hyperlipemia 06/27/2013  . Attention deficit disorder (ADD)   . GERD (gastroesophageal reflux disease)   . Allergy   . Depression, major, in remission ([place])   . Anemia   . Ocular rosacea   . HSV infection   . Osteopenia     Kathy Cook, PT,DPT 05/24/2019, 12:41 PM  [facility] Physical Therapy [address] [place], [place], [address] Phone: [phone]   Fax:  [phone]  Name: Kathy Cook MRN: [id number] Date of Birth: March 04, 1945

## 2019-05-27 ENCOUNTER — Ambulatory Visit: Payer: Medicare Other | Admitting: Physical Therapy

## 2019-05-27 ENCOUNTER — Other Ambulatory Visit: Payer: Self-pay

## 2019-05-27 DIAGNOSIS — R2689 Other abnormalities of gait and mobility: Secondary | ICD-10-CM | POA: Diagnosis not present

## 2019-05-27 DIAGNOSIS — M6281 Muscle weakness (generalized): Secondary | ICD-10-CM | POA: Diagnosis not present

## 2019-05-27 DIAGNOSIS — M25561 Pain in right knee: Secondary | ICD-10-CM | POA: Diagnosis not present

## 2019-05-27 DIAGNOSIS — M25661 Stiffness of right knee, not elsewhere classified: Secondary | ICD-10-CM

## 2019-05-27 NOTE — Therapy (Signed)
[facility] Physical Therapy [address] [place], [place], [address] Phone: [phone]   Fax:  [phone]  Physical Therapy Treatment  Patient Details  Name: Kathy Cook MRN: [id number] Date of Birth: 09/23/1945 Referring Provider (PT): [name], MD   Encounter Date: 05/27/2019  PT End of Session - 05/27/19 1313    Visit Number  22    Number of Visits  27   recert on [id number] to extend [place]   Date for PT Re-Evaluation  06/17/19    Authorization Type  [employer] MCR, last progress note done visit 22.    PT Start Time  1145    PT Stop Time  1227    PT Time Calculation (min)  42 min    Activity Tolerance  Patient tolerated treatment well    Behavior During Therapy  WFL for tasks assessed/performed       Past Medical History:  Diagnosis Date  . Allergy   . Anxiety   . Attention deficit disorder (ADD)   . Cancer [facility])    ? basal/squam cell on chest, and forehead  . Depression   . Dry eyes   . GERD (gastroesophageal reflux disease)   . HSV infection   . Lumbar degenerative disc disease   . Ocular rosacea   . Osteopenia     Past Surgical History:  Procedure Laterality Date  . SKIN BIOPSY     ?basal/squam cell on chest  . TONSILLECTOMY AND ADENOIDECTOMY    . TOTAL KNEE ARTHROPLASTY Right 03/04/2019   Procedure: RIGHT TOTAL KNEE ARTHROPLASTY;  Surgeon: [name], MD;  Location: [place];  Service: Orthopedics;  Laterality: Right;    There were no vitals filed for this visit.  Subjective Assessment - 05/27/19 1312    Subjective  no pain just feels tight in my Rt knee    Pertinent History  PMH: Rt TKA 03/04/19, Ca,lumbar DDD, osteopenia,    How long can you sit comfortably?  20    How long can you stand comfortably?  5 min    Patient Stated Goals  get the pain down and get back to normsl    Pain Onset  More than a month ago        [facility] Adult PT Treatment/Exercise - 05/27/19 0001      Knee/Hip Exercises: Stretches   Active Hamstring Stretch   Right;3 reps;30 seconds    Active Hamstring Stretch Limitations  seated    Knee: Self-Stretch Limitations  seated heelslides with self OP contract relax 10 sec X 10 rpes    Gastroc Stretch  Both;3 reps;30 seconds    Other Knee/Hip Stretches  prone hang 5 min with STM, interspaced with manual quad stretching       Knee/Hip Exercises: Aerobic   Nustep  10 min L7, seat 8      Knee/Hip Exercises: Machines for Strengthening   Total Gym Leg Press  bilat push 87 lbs 2X15, then dropped to 75 lbs for Rt leg only then left leg only for push 2X10      Knee/Hip Exercises: Standing   Forward Step Up Limitations  Rt side 15 reps stepping up onto 8 inch step with Rt leg then stepping down with Lt leg on other side. with bilat UE support      Manual Therapy   Manual therapy comments  g3-5 mobs for flexion and extension, PROM, manual hamstring stretching and quad stretching in prone, STM to posterior leg in prone hang  PT Short Term Goals - 04/19/19 1426      PT SHORT TERM GOAL #1   Title  she will be indpendent with initial HEP    Time  4    Period  Weeks    Status  Achieved    Target Date  04/17/19      PT SHORT TERM GOAL #2   Title  She will report less than 5/10 overall pain    Time  4    Period  Weeks    Status  Achieved      PT SHORT TERM GOAL #3   Title  WIll improve knee ROM 10-100 to improve function.    Status  Achieved        PT Long Term Goals - 05/20/19 1228      PT LONG TERM GOAL #1   Title  She will be indpendent with all HEP issued. (target for all goals is 05/15/18)    Time  8    Period  Weeks    Status  On-going      PT LONG TERM GOAL #2   Title  Will reduce pain to overall less than 3/10 with ususal activity.    Status  Achieved      PT LONG TERM GOAL #3   Title  Pt will be able to ambulate community distances no AD WFL gait pattern and be able to negotiate one flight of stairs reciprocally.    Time  8    Period  Weeks    Status   Achieved      PT LONG TERM GOAL #4   Title  Pt will improve Rt knee AROM 3-115 to improve function.    Time  8    Period  Weeks    Status  On-going      PT LONG TERM GOAL #5   Title  Pt will increased Rt knee strength to 5/5 and Rt hip strength to 4+/5 MMT to improve function    Status  Achieved            Plan - 05/27/19 1313    Clinical Impression Statement  continued with heavy focus on ROM and stretching as this continues to be her biggest deficit. Gait is overall improving due to some progress with ROM    Personal Factors and Comorbidities  Comorbidity 3+    Comorbidities  PMH: Rt TKA 03/04/19, Ca,lumbar DDD, osteopenia,    Examination-Activity Limitations  Squat;Stairs;Stand;Lift;Dressing;Locomotion Level;Transfers    Examination-Participation Restrictions  Cleaning;Meal Prep;Community Activity;Driving;Laundry;Shop    Stability/Clinical Decision Making  Evolving/Moderate complexity    Rehab Potential  Good    PT Frequency  3x / week    PT Duration  8 weeks    PT Treatment/Interventions  ADLs/Self Care Home Management;Cryotherapy;Moist Heat;Gait training;Stair training;Therapeutic activities;Therapeutic exercise;Balance training;Neuromuscular re-education;Manual techniques;Passive range of motion;Dry needling;Joint Manipulations;Taping;Vasopneumatic Device    PT Next Visit Plan  Low load long duration extension stretching, stairs, squats, SLS    PT Home Exercise Plan  Access Code: [facility]    Consulted and Agree with Plan of Care  Patient       Patient will benefit from skilled therapeutic intervention in order to improve the following deficits and impairments:  Abnormal gait, Decreased activity tolerance, Decreased balance, Decreased mobility, Decreased endurance, Decreased range of motion, Decreased strength, Increased edema, Difficulty walking, Increased fascial restricitons, Increased muscle spasms, Impaired flexibility, Pain  Visit Diagnosis: Acute pain of right  knee  Stiffness of right knee, not  elsewhere classified  Other abnormalities of gait and mobility  Muscle weakness (generalized)     Problem List Patient Active Problem List   Diagnosis Date Noted  . Status post total hip replacement, right 03/05/2019  . Arthritis of right knee 03/04/2019  . Unilateral primary osteoarthritis, right knee 02/05/2019  . Knee locking, right 01/09/2019  . Metatarsal stress fracture, right, initial encounter 10/03/2016  . DJD (degenerative joint disease) 06/09/2016  . Labile hypertension 08/01/2014  . Vitamin D deficiency 08/01/2014  . Medication management 08/01/2014  . Obesity 07/04/2014  . Hyperlipemia 06/27/2013  . Attention deficit disorder (ADD)   . GERD (gastroesophageal reflux disease)   . Allergy   . Depression, major, in remission ([place])   . Anemia   . Ocular rosacea   . HSV infection   . Osteopenia     Debbe Odea, PT,DPT 05/27/2019, 1:26 PM  [facility] Physical Therapy [address] [place], [place], [address] Phone: [phone]   Fax:  [phone]  Name: Kathy Cook MRN: [id number] Date of Birth: Mar 25, 1945

## 2019-05-28 ENCOUNTER — Ambulatory Visit
Admission: RE | Admit: 2019-05-28 | Discharge: 2019-05-28 | Disposition: A | Discharge status: Home / Self Care | Payer: Medicare Other | Source: Ambulatory Visit | Attending: Physician Assistant | Admitting: Physician Assistant

## 2019-05-28 DIAGNOSIS — N6320 Unspecified lump in the left breast, unspecified quadrant: Secondary | ICD-10-CM

## 2019-05-28 DIAGNOSIS — N6321 Unspecified lump in the left breast, upper outer quadrant: Secondary | ICD-10-CM | POA: Diagnosis not present

## 2019-05-28 DIAGNOSIS — D242 Benign neoplasm of left breast: Secondary | ICD-10-CM | POA: Diagnosis not present

## 2019-05-28 DIAGNOSIS — R928 Other abnormal and inconclusive findings on diagnostic imaging of breast: Secondary | ICD-10-CM | POA: Diagnosis not present

## 2019-05-28 HISTORY — PX: BREAST BIOPSY: SHX20

## 2019-05-29 ENCOUNTER — Other Ambulatory Visit: Payer: Self-pay

## 2019-05-29 ENCOUNTER — Ambulatory Visit: Payer: Medicare Other | Admitting: Physical Therapy

## 2019-05-29 DIAGNOSIS — M25661 Stiffness of right knee, not elsewhere classified: Secondary | ICD-10-CM | POA: Diagnosis not present

## 2019-05-29 DIAGNOSIS — R2689 Other abnormalities of gait and mobility: Secondary | ICD-10-CM | POA: Diagnosis not present

## 2019-05-29 DIAGNOSIS — R600 Localized edema: Secondary | ICD-10-CM

## 2019-05-29 DIAGNOSIS — M25561 Pain in right knee: Secondary | ICD-10-CM | POA: Diagnosis not present

## 2019-05-29 DIAGNOSIS — M6281 Muscle weakness (generalized): Secondary | ICD-10-CM | POA: Diagnosis not present

## 2019-05-29 NOTE — Therapy (Signed)
[facility] Physical Therapy [address] [place], [place], [address] Phone: [phone]   Fax:  [phone]  Physical Therapy Treatment  Patient Details  Name: Kathy Cook MRN: [id number] Date of Birth: 1945-11-15 Referring Provider (PT): [name], MD   Encounter Date: 05/29/2019  PT End of Session - 05/29/19 1232    Visit Number  23    Number of Visits  27   recert on [id number] to extend [place]   Date for PT Re-Evaluation  06/17/19    Authorization Type  [employer] MCR, last progress note done visit 41.    PT Start Time  1145    PT Stop Time  1233   first 10 min on heat   PT Time Calculation (min)  48 min    Activity Tolerance  Patient tolerated treatment well    Behavior During Therapy  WFL for tasks assessed/performed       Past Medical History:  Diagnosis Date  . Allergy   . Anxiety   . Attention deficit disorder (ADD)   . Cancer [facility])    ? basal/squam cell on chest, and forehead  . Depression   . Dry eyes   . GERD (gastroesophageal reflux disease)   . HSV infection   . Lumbar degenerative disc disease   . Ocular rosacea   . Osteopenia     Past Surgical History:  Procedure Laterality Date  . SKIN BIOPSY     ?basal/squam cell on chest  . TONSILLECTOMY AND ADENOIDECTOMY    . TOTAL KNEE ARTHROPLASTY Right 03/04/2019   Procedure: RIGHT TOTAL KNEE ARTHROPLASTY;  Surgeon: [name], MD;  Location: [place];  Service: Orthopedics;  Laterality: Right;    There were no vitals filed for this visit.  Subjective Assessment - 05/29/19 1230    Subjective  no pain just feels tight in my Rt knee    Pertinent History  PMH: Rt TKA 03/04/19, Ca,lumbar DDD, osteopenia,    How long can you sit comfortably?  20    How long can you stand comfortably?  5 min    Patient Stated Goals  get the pain down and get back to normsl    Pain Onset  More than a month ago                       [facility] Adult PT Treatment/Exercise - 05/29/19 0001      Knee/Hip Exercises: Stretches   Active Hamstring Stretch  Right;3 reps;30 seconds    Active Hamstring Stretch Limitations  seated    Gastroc Stretch  Both;3 reps;30 seconds    Other Knee/Hip Stretches  plantar fascia stretch for Lt foot 30 sec X 3    Other Knee/Hip Stretches  prone hang 5 min then 3 min using 7.5 lbs, with STM, interspaced with manual quad stretching       Knee/Hip Exercises: Aerobic   Nustep  10 min L7, seat 8      Modalities   Modalities  Moist Heat      Moist Heat Therapy   Number Minutes Moist Heat  10 Minutes    Moist Heat Location  Knee   Lt knee and Rt foot     Manual Therapy   Manual therapy comments  g3-5 mobs for flexion and extension, PROM, manual hamstring stretching and quad stretching in prone, STM to posterior leg in prone hang  PT Short Term Goals - 04/19/19 1426      PT SHORT TERM GOAL #1   Title  she will be indpendent with initial HEP    Time  4    Period  Weeks    Status  Achieved    Target Date  04/17/19      PT SHORT TERM GOAL #2   Title  She will report less than 5/10 overall pain    Time  4    Period  Weeks    Status  Achieved      PT SHORT TERM GOAL #3   Title  WIll improve knee ROM 10-100 to improve function.    Status  Achieved        PT Long Term Goals - 05/20/19 1228      PT LONG TERM GOAL #1   Title  She will be indpendent with all HEP issued. (target for all goals is 05/15/18)    Time  8    Period  Weeks    Status  On-going      PT LONG TERM GOAL #2   Title  Will reduce pain to overall less than 3/10 with ususal activity.    Status  Achieved      PT LONG TERM GOAL #3   Title  Pt will be able to ambulate community distances no AD WFL gait pattern and be able to negotiate one flight of stairs reciprocally.    Time  8    Period  Weeks    Status  Achieved      PT LONG TERM GOAL #4   Title  Pt will improve Rt knee AROM 3-115 to improve function.    Time  8    Period  Weeks    Status   On-going      PT LONG TERM GOAL #5   Title  Pt will increased Rt knee strength to 5/5 and Rt hip strength to 4+/5 MMT to improve function    Status  Achieved            Plan - 05/29/19 1234    Clinical Impression Statement  Continued with stretching program, trialed heat before hand as she is not having much swelling anyomore. She is not having pain anymore but needs continued stretching for her ROM deficits.    Personal Factors and Comorbidities  Comorbidity 3+    Comorbidities  PMH: Rt TKA 03/04/19, Ca,lumbar DDD, osteopenia,    Examination-Activity Limitations  Squat;Stairs;Stand;Lift;Dressing;Locomotion Level;Transfers    Examination-Participation Restrictions  Cleaning;Meal Prep;Community Activity;Driving;Laundry;Shop    Stability/Clinical Decision Making  Evolving/Moderate complexity    Rehab Potential  Good    PT Frequency  3x / week    PT Duration  8 weeks    PT Treatment/Interventions  ADLs/Self Care Home Management;Cryotherapy;Moist Heat;Gait training;Stair training;Therapeutic activities;Therapeutic exercise;Balance training;Neuromuscular re-education;Manual techniques;Passive range of motion;Dry needling;Joint Manipulations;Taping;Vasopneumatic Device    PT Next Visit Plan  Low load long duration extension stretching, stairs, squats, SLS    PT Home Exercise Plan  Access Code: [facility]    Consulted and Agree with Plan of Care  Patient       Patient will benefit from skilled therapeutic intervention in order to improve the following deficits and impairments:  Abnormal gait, Decreased activity tolerance, Decreased balance, Decreased mobility, Decreased endurance, Decreased range of motion, Decreased strength, Increased edema, Difficulty walking, Increased fascial restricitons, Increased muscle spasms, Impaired flexibility, Pain  Visit Diagnosis: Acute pain of right knee  Stiffness of  right knee, not elsewhere classified  Other abnormalities of gait and mobility  Muscle  weakness (generalized)  Localized edema     Problem List Patient Active Problem List   Diagnosis Date Noted  . Status post total hip replacement, right 03/05/2019  . Arthritis of right knee 03/04/2019  . Unilateral primary osteoarthritis, right knee 02/05/2019  . Knee locking, right 01/09/2019  . Metatarsal stress fracture, right, initial encounter 10/03/2016  . DJD (degenerative joint disease) 06/09/2016  . Labile hypertension 08/01/2014  . Vitamin D deficiency 08/01/2014  . Medication management 08/01/2014  . Obesity 07/04/2014  . Hyperlipemia 06/27/2013  . Attention deficit disorder (ADD)   . GERD (gastroesophageal reflux disease)   . Allergy   . Depression, major, in remission ([place])   . Anemia   . Ocular rosacea   . HSV infection   . Osteopenia     Debbe Odea, PT,DPT 05/29/2019, 12:35 PM  [facility] Physical Therapy [address] [place], [place], [address] Phone: [phone]   Fax:  [phone]  Name: Kathy Cook MRN: [id number] Date of Birth: [birth year]

## 2019-05-30 ENCOUNTER — Ambulatory Visit: Payer: Medicare Other | Admitting: Physical Therapy

## 2019-05-30 DIAGNOSIS — R600 Localized edema: Secondary | ICD-10-CM | POA: Diagnosis not present

## 2019-05-30 DIAGNOSIS — M6281 Muscle weakness (generalized): Secondary | ICD-10-CM

## 2019-05-30 DIAGNOSIS — R2689 Other abnormalities of gait and mobility: Secondary | ICD-10-CM

## 2019-05-30 DIAGNOSIS — M25561 Pain in right knee: Secondary | ICD-10-CM | POA: Diagnosis not present

## 2019-05-30 DIAGNOSIS — M25661 Stiffness of right knee, not elsewhere classified: Secondary | ICD-10-CM | POA: Diagnosis not present

## 2019-05-30 DIAGNOSIS — R262 Difficulty in walking, not elsewhere classified: Secondary | ICD-10-CM

## 2019-05-30 NOTE — Therapy (Signed)
[facility] Physical Therapy [address] [place], [place], [address] Phone: [phone]   Fax:  [phone]  Physical Therapy Treatment  Patient Details  Name: Kathy Cook MRN: [id number] Date of Birth: 10-Oct-1945 Referring Provider (PT): [name], MD   Encounter Date: 05/30/2019  PT End of Session - 05/30/19 1218    Visit Number  24    Number of Visits  27   recert on [id number] to extend [place]   Date for PT Re-Evaluation  06/17/19    Authorization Type  [employer] MCR, last progress note done visit 19.    PT Start Time  1145    PT Stop Time  1230    PT Time Calculation (min)  45 min    Activity Tolerance  Patient tolerated treatment well    Behavior During Therapy  WFL for tasks assessed/performed       Past Medical History:  Diagnosis Date  . Allergy   . Anxiety   . Attention deficit disorder (ADD)   . Cancer [facility])    ? basal/squam cell on chest, and forehead  . Depression   . Dry eyes   . GERD (gastroesophageal reflux disease)   . HSV infection   . Lumbar degenerative disc disease   . Ocular rosacea   . Osteopenia     Past Surgical History:  Procedure Laterality Date  . SKIN BIOPSY     ?basal/squam cell on chest  . TONSILLECTOMY AND ADENOIDECTOMY    . TOTAL KNEE ARTHROPLASTY Right 03/04/2019   Procedure: RIGHT TOTAL KNEE ARTHROPLASTY;  Surgeon: [name], MD;  Location: [place];  Service: Orthopedics;  Laterality: Right;    There were no vitals filed for this visit.  Subjective Assessment - 05/30/19 1218    Subjective  my knee is tight but its getting there, no pain    Pertinent History  PMH: Rt TKA 03/04/19, Ca,lumbar DDD, osteopenia,    How long can you sit comfortably?  20    How long can you stand comfortably?  5 min    Patient Stated Goals  get the pain down and get back to normsl    Pain Onset  More than a month ago                       [facility] Adult PT Treatment/Exercise - 05/30/19 0001      Knee/Hip Exercises:  Stretches   Active Hamstring Stretch  Right;3 reps;30 seconds    Active Hamstring Stretch Limitations  seated    Quad Stretch Limitations  prone with strap 2 min  x2    Other Knee/Hip Stretches  prone hang for 10 min with heat to quads and H.S      Knee/Hip Exercises: Aerobic   Recumbent Bike  10 min L1      Knee/Hip Exercises: Standing   Other Standing Knee Exercises  TRX squats and lunges X 10 ea      Moist Heat Therapy   Number Minutes Moist Heat  10 Minutes   with stretching   Moist Heat Location  Knee      Manual Therapy   Manual therapy comments  g3-5 mobs for flexion and extension, PROM, manual hamstring stretching and quad stretching in prone, STM to posterior leg in prone hang               PT Short Term Goals - 04/19/19 1426      PT SHORT TERM GOAL #1  Title  she will be indpendent with initial HEP    Time  4    Period  Weeks    Status  Achieved    Target Date  04/17/19      PT SHORT TERM GOAL #2   Title  She will report less than 5/10 overall pain    Time  4    Period  Weeks    Status  Achieved      PT SHORT TERM GOAL #3   Title  WIll improve knee ROM 10-100 to improve function.    Status  Achieved        PT Long Term Goals - 05/20/19 1228      PT LONG TERM GOAL #1   Title  She will be indpendent with all HEP issued. (target for all goals is 05/15/18)    Time  8    Period  Weeks    Status  On-going      PT LONG TERM GOAL #2   Title  Will reduce pain to overall less than 3/10 with ususal activity.    Status  Achieved      PT LONG TERM GOAL #3   Title  Pt will be able to ambulate community distances no AD WFL gait pattern and be able to negotiate one flight of stairs reciprocally.    Time  8    Period  Weeks    Status  Achieved      PT LONG TERM GOAL #4   Title  Pt will improve Rt knee AROM 3-115 to improve function.    Time  8    Period  Weeks    Status  On-going      PT LONG TERM GOAL #5   Title  Pt will increased Rt knee  strength to 5/5 and Rt hip strength to 4+/5 MMT to improve function    Status  Achieved            Plan - 05/30/19 1219    Clinical Impression Statement  Heavy ROM focus, her knee joint feels good but ROM limitations due to muscle tightness, held stretches longer today and she was encouraged to do this more at home.    Personal Factors and Comorbidities  Comorbidity 3+    Comorbidities  PMH: Rt TKA 03/04/19, Ca,lumbar DDD, osteopenia,    Examination-Activity Limitations  Squat;Stairs;Stand;Lift;Dressing;Locomotion Level;Transfers    Examination-Participation Restrictions  Cleaning;Meal Prep;Community Activity;Driving;Laundry;Shop    Stability/Clinical Decision Making  Evolving/Moderate complexity    Rehab Potential  Good    PT Frequency  3x / week    PT Duration  8 weeks    PT Treatment/Interventions  ADLs/Self Care Home Management;Cryotherapy;Moist Heat;Gait training;Stair training;Therapeutic activities;Therapeutic exercise;Balance training;Neuromuscular re-education;Manual techniques;Passive range of motion;Dry needling;Joint Manipulations;Taping;Vasopneumatic Device    PT Next Visit Plan  Low load long duration extension stretching, stairs, squats, SLS    PT Home Exercise Plan  Access Code: [facility]    Consulted and Agree with Plan of Care  Patient       Patient will benefit from skilled therapeutic intervention in order to improve the following deficits and impairments:  Abnormal gait, Decreased activity tolerance, Decreased balance, Decreased mobility, Decreased endurance, Decreased range of motion, Decreased strength, Increased edema, Difficulty walking, Increased fascial restricitons, Increased muscle spasms, Impaired flexibility, Pain  Visit Diagnosis: Acute pain of right knee  Stiffness of right knee, not elsewhere classified  Other abnormalities of gait and mobility  Muscle weakness (generalized)  Localized edema  Difficulty in walking, not elsewhere  classified     Problem List Patient Active Problem List   Diagnosis Date Noted  . Status post total hip replacement, right 03/05/2019  . Arthritis of right knee 03/04/2019  . Unilateral primary osteoarthritis, right knee 02/05/2019  . Knee locking, right 01/09/2019  . Metatarsal stress fracture, right, initial encounter 10/03/2016  . DJD (degenerative joint disease) 06/09/2016  . Labile hypertension 08/01/2014  . Vitamin D deficiency 08/01/2014  . Medication management 08/01/2014  . Obesity 07/04/2014  . Hyperlipemia 06/27/2013  . Attention deficit disorder (ADD)   . GERD (gastroesophageal reflux disease)   . Allergy   . Depression, major, in remission ([place])   . Anemia   . Ocular rosacea   . HSV infection   . Osteopenia     Kathy Cook 05/30/2019, 12:36 PM  [facility] Physical Therapy [address] [place], [place], [address] Phone: [phone]   Fax:  [phone]  Name: Kathy Cook MRN: [id number] Date of Birth: 1945-12-17

## 2019-06-03 ENCOUNTER — Telehealth: Payer: Self-pay | Admitting: *Deleted

## 2019-06-03 ENCOUNTER — Other Ambulatory Visit: Payer: Self-pay

## 2019-06-03 ENCOUNTER — Ambulatory Visit: Payer: Medicare Other | Admitting: Physical Therapy

## 2019-06-03 DIAGNOSIS — M6281 Muscle weakness (generalized): Secondary | ICD-10-CM

## 2019-06-03 DIAGNOSIS — R600 Localized edema: Secondary | ICD-10-CM

## 2019-06-03 DIAGNOSIS — M25661 Stiffness of right knee, not elsewhere classified: Secondary | ICD-10-CM

## 2019-06-03 DIAGNOSIS — M25561 Pain in right knee: Secondary | ICD-10-CM | POA: Diagnosis not present

## 2019-06-03 DIAGNOSIS — R2689 Other abnormalities of gait and mobility: Secondary | ICD-10-CM

## 2019-06-03 NOTE — Telephone Encounter (Signed)
Attempted 90 day call; No answer- left VM requesting call back.

## 2019-06-03 NOTE — Therapy (Signed)
[facility] Physical Therapy [address] [place], [place], [address] Phone: [phone]   Fax:  [phone]  Physical Therapy Treatment  Patient Details  Name: Kathy Cook MRN: [id number] Date of Birth: 1945/07/28 Referring Provider (PT): [name], MD   Encounter Date: 06/03/2019  PT End of Session - 06/03/19 [id number]    Visit Number  25    Number of Visits  27   recert on [id number] to extend [place]   Date for PT Re-Evaluation  06/17/19    Authorization Type  [employer] MCR, last progress note done visit 53.    PT Start Time  1147    PT Stop Time  1230    PT Time Calculation (min)  43 min    Activity Tolerance  Patient tolerated treatment well    Behavior During Therapy  WFL for tasks assessed/performed       Past Medical History:  Diagnosis Date  . Allergy   . Anxiety   . Attention deficit disorder (ADD)   . Cancer [facility])    ? basal/squam cell on chest, and forehead  . Depression   . Dry eyes   . GERD (gastroesophageal reflux disease)   . HSV infection   . Lumbar degenerative disc disease   . Ocular rosacea   . Osteopenia     Past Surgical History:  Procedure Laterality Date  . SKIN BIOPSY     ?basal/squam cell on chest  . TONSILLECTOMY AND ADENOIDECTOMY    . TOTAL KNEE ARTHROPLASTY Right 03/04/2019   Procedure: RIGHT TOTAL KNEE ARTHROPLASTY;  Surgeon: [name], MD;  Location: [place];  Service: Orthopedics;  Laterality: Right;    There were no vitals filed for this visit.  Subjective Assessment - 06/03/19 1433    Subjective  my knee is tight and really sore today    Pertinent History  PMH: Rt TKA 03/04/19, Ca,lumbar DDD, osteopenia,    How long can you sit comfortably?  20    How long can you stand comfortably?  5 min    Patient Stated Goals  get the pain down and get back to normsl    Pain Onset  More than a month ago        [facility] Adult PT Treatment/Exercise - 06/03/19 0001      Knee/Hip Exercises: Aerobic   Recumbent Bike  10 min L1       Knee/Hip Exercises: Machines for Strengthening   Total Gym Leg Press  bilat push 93 lbs 2X15, then dropped to 75 lbs for Rt leg only then left leg only for push 2X15      Knee/Hip Exercises: Standing   Stairs  up/down stairs in hall X 2 reciprocal pattern using handrails    Other Standing Knee Exercises  TRX squats 2X10 TRX mini lunges and skater lunges X10 ea bilat    Other Standing Knee Exercises  march walking in clinic up/down X 30 ft X 5 reps      Moist Heat Therapy   Number Minutes Moist Heat  10 Minutes   posterior knee with stretching   Moist Heat Location  Knee      Manual Therapy   Manual therapy comments  g3-5 mobs for flexion and extension, PROM, manual hamstring stretching and quad stretching in prone, STM to posterior leg in prone hang               PT Short Term Goals - 04/19/19 1426      PT  SHORT TERM GOAL #1   Title  she will be indpendent with initial HEP    Time  4    Period  Weeks    Status  Achieved    Target Date  04/17/19      PT SHORT TERM GOAL #2   Title  She will report less than 5/10 overall pain    Time  4    Period  Weeks    Status  Achieved      PT SHORT TERM GOAL #3   Title  WIll improve knee ROM 10-100 to improve function.    Status  Achieved        PT Long Term Goals - 05/20/19 1228      PT LONG TERM GOAL #1   Title  She will be indpendent with all HEP issued. (target for all goals is 05/15/18)    Time  8    Period  Weeks    Status  On-going      PT LONG TERM GOAL #2   Title  Will reduce pain to overall less than 3/10 with ususal activity.    Status  Achieved      PT LONG TERM GOAL #3   Title  Pt will be able to ambulate community distances no AD WFL gait pattern and be able to negotiate one flight of stairs reciprocally.    Time  8    Period  Weeks    Status  Achieved      PT LONG TERM GOAL #4   Title  Pt will improve Rt knee AROM 3-115 to improve function.    Time  8    Period  Weeks    Status  On-going       PT LONG TERM GOAL #5   Title  Pt will increased Rt knee strength to 5/5 and Rt hip strength to 4+/5 MMT to improve function    Status  Achieved            Plan - 06/03/19 1437    Clinical Impression Statement  progressed strength and endurance today for legs. Worked more on stairs and she does lack some endurance and ROM with this. Continued with manual therapy for stretching and ROM as tolreated.    Personal Factors and Comorbidities  Comorbidity 3+    Comorbidities  PMH: Rt TKA 03/04/19, Ca,lumbar DDD, osteopenia,    Examination-Activity Limitations  Squat;Stairs;Stand;Lift;Dressing;Locomotion Level;Transfers    Examination-Participation Restrictions  Cleaning;Meal Prep;Community Activity;Driving;Laundry;Shop    Stability/Clinical Decision Making  Evolving/Moderate complexity    Rehab Potential  Good    PT Frequency  3x / week    PT Duration  8 weeks    PT Treatment/Interventions  ADLs/Self Care Home Management;Cryotherapy;Moist Heat;Gait training;Stair training;Therapeutic activities;Therapeutic exercise;Balance training;Neuromuscular re-education;Manual techniques;Passive range of motion;Dry needling;Joint Manipulations;Taping;Vasopneumatic Device    PT Next Visit Plan  Low load long duration extension stretching, stairs, squats, SLS    PT Home Exercise Plan  Access Code: [facility]    Consulted and Agree with Plan of Care  Patient       Patient will benefit from skilled therapeutic intervention in order to improve the following deficits and impairments:  Abnormal gait, Decreased activity tolerance, Decreased balance, Decreased mobility, Decreased endurance, Decreased range of motion, Decreased strength, Increased edema, Difficulty walking, Increased fascial restricitons, Increased muscle spasms, Impaired flexibility, Pain  Visit Diagnosis: Acute pain of right knee  Stiffness of right knee, not elsewhere classified  Other abnormalities of gait and mobility  Muscle weakness  (generalized)  Localized edema     Problem List Patient Active Problem List   Diagnosis Date Noted  . Status post total hip replacement, right 03/05/2019  . Arthritis of right knee 03/04/2019  . Unilateral primary osteoarthritis, right knee 02/05/2019  . Knee locking, right 01/09/2019  . Metatarsal stress fracture, right, initial encounter 10/03/2016  . DJD (degenerative joint disease) 06/09/2016  . Labile hypertension 08/01/2014  . Vitamin D deficiency 08/01/2014  . Medication management 08/01/2014  . Obesity 07/04/2014  . Hyperlipemia 06/27/2013  . Attention deficit disorder (ADD)   . GERD (gastroesophageal reflux disease)   . Allergy   . Depression, major, in remission ([place])   . Anemia   . Ocular rosacea   . HSV infection   . Osteopenia     Debbe Odea, PT,DPT 06/03/2019, 2:39 PM  [facility] Physical Therapy [address] [place], [place], [address] Phone: [phone]   Fax:  [phone]  Name: SUGEY DEEMS MRN: [id number] Date of Birth: 1945/08/08

## 2019-06-05 ENCOUNTER — Encounter: Payer: Medicare Other | Admitting: Physical Therapy

## 2019-06-05 ENCOUNTER — Telehealth: Payer: Self-pay | Admitting: *Deleted

## 2019-06-05 NOTE — Care Plan (Signed)
Call to patient. She states she is having some tightness and the muscles surrounding the knee are very sore. She gets fatigued easily in the knee, but overall knows that she has greatly improved. She continues with OPPT currently and has an appointment scheduled later in the week. She is extremely pleased with the care she has received. 90 day survey completed.

## 2019-06-05 NOTE — Telephone Encounter (Signed)
90 day call completed. 

## 2019-06-07 ENCOUNTER — Other Ambulatory Visit: Payer: Self-pay

## 2019-06-07 ENCOUNTER — Ambulatory Visit: Payer: Medicare Other | Admitting: Physical Therapy

## 2019-06-07 DIAGNOSIS — R2689 Other abnormalities of gait and mobility: Secondary | ICD-10-CM

## 2019-06-07 DIAGNOSIS — M25661 Stiffness of right knee, not elsewhere classified: Secondary | ICD-10-CM

## 2019-06-07 DIAGNOSIS — R600 Localized edema: Secondary | ICD-10-CM

## 2019-06-07 DIAGNOSIS — M25561 Pain in right knee: Secondary | ICD-10-CM

## 2019-06-07 DIAGNOSIS — M6281 Muscle weakness (generalized): Secondary | ICD-10-CM

## 2019-06-07 NOTE — Therapy (Signed)
[facility] Physical Therapy [address] [place], [place], [address] Phone: [phone]   Fax:  [phone]  Physical Therapy Treatment  Patient Details  Name: Kathy Cook MRN: [id number] Date of Birth: [birth year] Referring Provider (PT): [name], MD   Encounter Date: 06/07/2019  PT End of Session - 06/07/19 1237    Visit Number  26    Number of Visits  27   recert on 9/93 to extend [place]   Date for PT Re-Evaluation  06/17/19    Authorization Type  [employer] MCR, last progress note done visit 64.    PT Start Time  1145    PT Stop Time  1240    PT Time Calculation (min)  55 min    Activity Tolerance  Patient tolerated treatment well    Behavior During Therapy  WFL for tasks assessed/performed       Past Medical History:  Diagnosis Date  . Allergy   . Anxiety   . Attention deficit disorder (ADD)   . Cancer [facility])    ? basal/squam cell on chest, and forehead  . Depression   . Dry eyes   . GERD (gastroesophageal reflux disease)   . HSV infection   . Lumbar degenerative disc disease   . Ocular rosacea   . Osteopenia     Past Surgical History:  Procedure Laterality Date  . SKIN BIOPSY     ?basal/squam cell on chest  . TONSILLECTOMY AND ADENOIDECTOMY    . TOTAL KNEE ARTHROPLASTY Right 03/04/2019   Procedure: RIGHT TOTAL KNEE ARTHROPLASTY;  Surgeon: [name], MD;  Location: [place];  Service: Orthopedics;  Laterality: Right;    There were no vitals filed for this visit.  Subjective Assessment - 06/07/19 1234    Subjective  my knee is really sore today but i am going up/down stairs more    Pertinent History  PMH: Rt TKA 03/04/19, Ca,lumbar DDD, osteopenia,    How long can you sit comfortably?  20    How long can you stand comfortably?  5 min    Patient Stated Goals  get the pain down and get back to normsl    Pain Onset  More than a month ago                       [facility] Adult PT Treatment/Exercise - 06/07/19 0001       Knee/Hip Exercises: Stretches   Active Hamstring Stretch  Right;3 reps;30 seconds    Passive Hamstring Stretch  Right;3 reps;30 seconds    Passive Hamstring Stretch Limitations  with MET    Quad Stretch Limitations  prone with strap 5 min    Gastroc Stretch  Both;3 reps;30 seconds      Knee/Hip Exercises: Aerobic   Recumbent Bike  10 min L1      Knee/Hip Exercises: Standing   Other Standing Knee Exercises  TRX squats 2X10 TRX mini lunges and skater lunges X10 ea bilat      Modalities   Modalities  --   10 min heat to posterior knee, ice to anterior knee     Manual Therapy   Manual therapy comments  g3-5 mobs for flexion and extension, PROM, manual hamstring stretching and quad stretching in prone, STM to posterior leg in prone hang               PT Short Term Goals - 04/19/19 1426      PT SHORT TERM  GOAL #1   Title  she will be indpendent with initial HEP    Time  4    Period  Weeks    Status  Achieved    Target Date  04/17/19      PT SHORT TERM GOAL #2   Title  She will report less than 5/10 overall pain    Time  4    Period  Weeks    Status  Achieved      PT SHORT TERM GOAL #3   Title  WIll improve knee ROM 10-100 to improve function.    Status  Achieved        PT Long Term Goals - 05/20/19 1228      PT LONG TERM GOAL #1   Title  She will be indpendent with all HEP issued. (target for all goals is 05/15/18)    Time  8    Period  Weeks    Status  On-going      PT LONG TERM GOAL #2   Title  Will reduce pain to overall less than 3/10 with ususal activity.    Status  Achieved      PT LONG TERM GOAL #3   Title  Pt will be able to ambulate community distances no AD WFL gait pattern and be able to negotiate one flight of stairs reciprocally.    Time  8    Period  Weeks    Status  Achieved      PT LONG TERM GOAL #4   Title  Pt will improve Rt knee AROM 3-115 to improve function.    Time  8    Period  Weeks    Status  On-going      PT LONG TERM GOAL  #5   Title  Pt will increased Rt knee strength to 5/5 and Rt hip strength to 4+/5 MMT to improve function    Status  Achieved            Plan - 06/07/19 1243    Clinical Impression Statement  Session focused on knee strength and ROM. She continues to lack about 3 deg of extension and will benefit from a few more weeks of PT to try to get her full knee extension which is limiting her gait.    Personal Factors and Comorbidities  Comorbidity 3+    Comorbidities  PMH: Rt TKA 03/04/19, Ca,lumbar DDD, osteopenia,    Examination-Activity Limitations  Squat;Stairs;Stand;Lift;Dressing;Locomotion Level;Transfers    Examination-Participation Restrictions  Cleaning;Meal Prep;Community Activity;Driving;Laundry;Shop    Stability/Clinical Decision Making  Evolving/Moderate complexity    Rehab Potential  Good    PT Frequency  3x / week    PT Duration  8 weeks    PT Treatment/Interventions  ADLs/Self Care Home Management;Cryotherapy;Moist Heat;Gait training;Stair training;Therapeutic activities;Therapeutic exercise;Balance training;Neuromuscular re-education;Manual techniques;Passive range of motion;Dry needling;Joint Manipulations;Taping;Vasopneumatic Device    PT Next Visit Plan  Low load long duration extension stretching, stairs, squats, SLS    PT Home Exercise Plan  Access Code: [facility]    Consulted and Agree with Plan of Care  Patient       Patient will benefit from skilled therapeutic intervention in order to improve the following deficits and impairments:  Abnormal gait, Decreased activity tolerance, Decreased balance, Decreased mobility, Decreased endurance, Decreased range of motion, Decreased strength, Increased edema, Difficulty walking, Increased fascial restricitons, Increased muscle spasms, Impaired flexibility, Pain  Visit Diagnosis: Acute pain of right knee  Stiffness of right knee, not elsewhere classified  Other abnormalities of gait and mobility  Muscle weakness  (generalized)  Localized edema     Problem List Patient Active Problem List   Diagnosis Date Noted  . Status post total hip replacement, right 03/05/2019  . Arthritis of right knee 03/04/2019  . Unilateral primary osteoarthritis, right knee 02/05/2019  . Knee locking, right 01/09/2019  . Metatarsal stress fracture, right, initial encounter 10/03/2016  . DJD (degenerative joint disease) 06/09/2016  . Labile hypertension 08/01/2014  . Vitamin D deficiency 08/01/2014  . Medication management 08/01/2014  . Obesity 07/04/2014  . Hyperlipemia 06/27/2013  . Attention deficit disorder (ADD)   . GERD (gastroesophageal reflux disease)   . Allergy   . Depression, major, in remission ([place])   . Anemia   . Ocular rosacea   . HSV infection   . Osteopenia     Debbe Odea, PT,DPT 06/07/2019, 12:45 PM  [facility] Physical Therapy [address] [place], [place], [address] Phone: [phone]   Fax:  [phone]  Name: THARA SEARING MRN: [id number] Date of Birth: [birth year]

## 2019-06-11 ENCOUNTER — Other Ambulatory Visit: Payer: Self-pay

## 2019-06-11 ENCOUNTER — Ambulatory Visit: Payer: Medicare Other | Admitting: Physical Therapy

## 2019-06-11 DIAGNOSIS — R600 Localized edema: Secondary | ICD-10-CM | POA: Diagnosis not present

## 2019-06-11 DIAGNOSIS — R2689 Other abnormalities of gait and mobility: Secondary | ICD-10-CM | POA: Diagnosis not present

## 2019-06-11 DIAGNOSIS — M25661 Stiffness of right knee, not elsewhere classified: Secondary | ICD-10-CM

## 2019-06-11 DIAGNOSIS — M25561 Pain in right knee: Secondary | ICD-10-CM | POA: Diagnosis not present

## 2019-06-11 DIAGNOSIS — M6281 Muscle weakness (generalized): Secondary | ICD-10-CM

## 2019-06-11 NOTE — Therapy (Signed)
[facility] Physical Therapy [address] [place], [place], [address] Phone: [phone]   Fax:  [phone]  Physical Therapy Treatment  Patient Details  Name: Kathy Cook MRN: [id number] Date of Birth: 05-01-1945 Referring Provider (PT): [name], MD   Encounter Date: 06/11/2019  PT End of Session - 06/11/19 1538    Visit Number  27    Number of Visits  35   recert on 8/28 to extend [place]   Date for PT Re-Evaluation  00/34/91   recert on 7/91 to extend [place] until 5/11   Authorization Type  [employer] MCR, last progress note done visit 19.    PT Start Time  1445    PT Stop Time  1530    PT Time Calculation (min)  45 min    Activity Tolerance  Patient tolerated treatment well    Behavior During Therapy  WFL for tasks assessed/performed       Past Medical History:  Diagnosis Date  . Allergy   . Anxiety   . Attention deficit disorder (ADD)   . Cancer [facility])    ? basal/squam cell on chest, and forehead  . Depression   . Dry eyes   . GERD (gastroesophageal reflux disease)   . HSV infection   . Lumbar degenerative disc disease   . Ocular rosacea   . Osteopenia     Past Surgical History:  Procedure Laterality Date  . SKIN BIOPSY     ?basal/squam cell on chest  . TONSILLECTOMY AND ADENOIDECTOMY    . TOTAL KNEE ARTHROPLASTY Right 03/04/2019   Procedure: RIGHT TOTAL KNEE ARTHROPLASTY;  Surgeon: [name], MD;  Location: [place];  Service: Orthopedics;  Laterality: Right;    There were no vitals filed for this visit.  Subjective Assessment - 06/11/19 1519    Subjective  my knee is sore and stiff but overall feels okay other than that    Pertinent History  PMH: Rt TKA 03/04/19, Ca,lumbar DDD, osteopenia,    How long can you sit comfortably?  20    How long can you stand comfortably?  5 min    Patient Stated Goals  get the pain down and get back to normsl    Pain Onset  More than a month ago                       [facility] Adult PT  Treatment/Exercise - 06/11/19 0001      Knee/Hip Exercises: Stretches   Active Hamstring Stretch  Right;3 reps;30 seconds    Passive Hamstring Stretch  Right;3 reps;30 seconds    Passive Hamstring Stretch Limitations  with MET    Quad Stretch Limitations  passive in prone 30 sec X 3    Gastroc Stretch  Both;3 reps;30 seconds    Gastroc Stretch Limitations  slantboard    Other Knee/Hip Stretches  prone hang for knee ext with 7.5 lb 4 min then 3 min then 2 min, interspaced with prone quad stretching, Supine heel prop on foam roller for extension 7.5 lbs X 5 min      Knee/Hip Exercises: Aerobic   Nustep  10 min L7, seat 8      Knee/Hip Exercises: Standing   Other Standing Knee Exercises  TRX squats 2X10 TRX mini lunges and skater lunges X10 ea bilat    Other Standing Knee Exercises  up/down stairs in hall reciproally one hand rail X 3 reps      Manual  Therapy   Manual therapy comments  g3-5 mobs for flexion and extension, PROM, manual hamstring stretching and quad stretching in prone, STM to posterior leg in prone hang               PT Short Term Goals - 04/19/19 1426      PT SHORT TERM GOAL #1   Title  she will be indpendent with initial HEP    Time  4    Period  Weeks    Status  Achieved    Target Date  04/17/19      PT SHORT TERM GOAL #2   Title  She will report less than 5/10 overall pain    Time  4    Period  Weeks    Status  Achieved      PT SHORT TERM GOAL #3   Title  WIll improve knee ROM 10-100 to improve function.    Status  Achieved        PT Long Term Goals - 06/11/19 2108      PT LONG TERM GOAL #1   Title  She will be indpendent with all HEP issued. (target for all goals is 05/15/18)    Time  8    Period  Weeks    Status  Achieved      PT LONG TERM GOAL #2   Title  Will reduce pain to overall less than 3/10 with ususal activity.    Status  Achieved      PT LONG TERM GOAL #3   Title  Pt will be able to ambulate community distances no AD WFL  gait pattern and be able to negotiate one flight of stairs reciprocally.    Time  8    Period  Weeks    Status  Partially Met      PT LONG TERM GOAL #4   Title  Pt will improve Rt knee AROM 3-115 to improve function.    Time  8    Period  Weeks    Status  On-going      PT LONG TERM GOAL #5   Title  Pt will increased Rt knee strength to 5/5 and Rt hip strength to 4+/5 MMT to improve function    Status  Achieved            Plan - 06/11/19 2106    Clinical Impression Statement  She continues to lack mild knee ROM and strength and will benefit from a few more weeks of PT to try to get her full knee extension and strength which is limiting her gait and ability with stairs/comunity endurance    Personal Factors and Comorbidities  Comorbidity 3+    Comorbidities  PMH: Rt TKA 03/04/19, Ca,lumbar DDD, osteopenia,    Examination-Activity Limitations  Squat;Stairs;Stand;Lift;Dressing;Locomotion Level;Transfers    Examination-Participation Restrictions  Cleaning;Meal Prep;Community Activity;Driving;Laundry;Shop    Stability/Clinical Decision Making  Evolving/Moderate complexity    Rehab Potential  Good    PT Frequency  3x / week    PT Duration  8 weeks    PT Treatment/Interventions  ADLs/Self Care Home Management;Cryotherapy;Moist Heat;Gait training;Stair training;Therapeutic activities;Therapeutic exercise;Balance training;Neuromuscular re-education;Manual techniques;Passive range of motion;Dry needling;Joint Manipulations;Taping;Vasopneumatic Device    PT Next Visit Plan  Low load long duration extension stretching, stairs, squats, SLS    PT Home Exercise Plan  Access Code: [facility]    Consulted and Agree with Plan of Care  Patient       Patient will benefit from skilled  therapeutic intervention in order to improve the following deficits and impairments:  Abnormal gait, Decreased activity tolerance, Decreased balance, Decreased mobility, Decreased endurance, Decreased range of motion,  Decreased strength, Increased edema, Difficulty walking, Increased fascial restricitons, Increased muscle spasms, Impaired flexibility, Pain  Visit Diagnosis: Acute pain of right knee  Stiffness of right knee, not elsewhere classified  Other abnormalities of gait and mobility  Muscle weakness (generalized)  Localized edema     Problem List Patient Active Problem List   Diagnosis Date Noted  . Status post total hip replacement, right 03/05/2019  . Arthritis of right knee 03/04/2019  . Unilateral primary osteoarthritis, right knee 02/05/2019  . Knee locking, right 01/09/2019  . Metatarsal stress fracture, right, initial encounter 10/03/2016  . DJD (degenerative joint disease) 06/09/2016  . Labile hypertension 08/01/2014  . Vitamin D deficiency 08/01/2014  . Medication management 08/01/2014  . Obesity 07/04/2014  . Hyperlipemia 06/27/2013  . Attention deficit disorder (ADD)   . GERD (gastroesophageal reflux disease)   . Allergy   . Depression, major, in remission ([place])   . Anemia   . Ocular rosacea   . HSV infection   . Osteopenia     Silvestre Mesi 06/11/2019, 9:11 PM  [facility] Physical Therapy [address] [place], [place], [address] Phone: [phone]   Fax:  [phone]  Name: Kathy Cook MRN: [id number] Date of Birth: 03/18/1945

## 2019-06-13 ENCOUNTER — Encounter: Payer: Self-pay | Admitting: Physical Therapy

## 2019-06-13 ENCOUNTER — Other Ambulatory Visit: Payer: Self-pay

## 2019-06-13 ENCOUNTER — Ambulatory Visit: Payer: Medicare Other | Admitting: Physical Therapy

## 2019-06-13 DIAGNOSIS — M25661 Stiffness of right knee, not elsewhere classified: Secondary | ICD-10-CM

## 2019-06-13 DIAGNOSIS — R600 Localized edema: Secondary | ICD-10-CM

## 2019-06-13 DIAGNOSIS — M25561 Pain in right knee: Secondary | ICD-10-CM | POA: Diagnosis not present

## 2019-06-13 DIAGNOSIS — R2689 Other abnormalities of gait and mobility: Secondary | ICD-10-CM | POA: Diagnosis not present

## 2019-06-13 DIAGNOSIS — M6281 Muscle weakness (generalized): Secondary | ICD-10-CM

## 2019-06-13 DIAGNOSIS — R262 Difficulty in walking, not elsewhere classified: Secondary | ICD-10-CM

## 2019-06-13 NOTE — Therapy (Signed)
[facility] Physical Therapy [address] [place], [place], [address] Phone: [phone]   Fax:  [phone]  Physical Therapy Treatment  Patient Details  Name: Kathy Cook MRN: [id number] Date of Birth: 11-01-1945 Referring Provider (PT): [name], MD   Encounter Date: 06/13/2019  PT End of Session - 06/13/19 1115    Visit Number  28    Number of Visits  35    Date for PT Re-Evaluation  07/09/19    Authorization Type  [employer] MCR, last progress note done visit 19.    PT Start Time  1115    PT Stop Time  1200    PT Time Calculation (min)  45 min    Activity Tolerance  Patient tolerated treatment well       Past Medical History:  Diagnosis Date  . Allergy   . Anxiety   . Attention deficit disorder (ADD)   . Cancer [facility])    ? basal/squam cell on chest, and forehead  . Depression   . Dry eyes   . GERD (gastroesophageal reflux disease)   . HSV infection   . Lumbar degenerative disc disease   . Ocular rosacea   . Osteopenia     Past Surgical History:  Procedure Laterality Date  . SKIN BIOPSY     ?basal/squam cell on chest  . TONSILLECTOMY AND ADENOIDECTOMY    . TOTAL KNEE ARTHROPLASTY Right 03/04/2019   Procedure: RIGHT TOTAL KNEE ARTHROPLASTY;  Surgeon: [name], MD;  Location: [place];  Service: Orthopedics;  Laterality: Right;    There were no vitals filed for this visit.  Subjective Assessment - 06/13/19 1110    Subjective  Pt reporting only soreness.    Pertinent History  PMH: Rt TKA 03/04/19, Ca,lumbar DDD, osteopenia,    How long can you sit comfortably?  20    How long can you stand comfortably?  5 min    Patient Stated Goals  get the pain down and get back to normsl    Currently in Pain?  Yes    Pain Score  1     Pain Location  Knee    Pain Orientation  Right    Pain Descriptors / Indicators  Sore    Pain Onset  More than a month ago                       [facility] Adult PT Treatment/Exercise - 06/13/19 0001       Knee/Hip Exercises: Stretches   Active Hamstring Stretch  Right;3 reps;30 seconds    Passive Hamstring Stretch  Right;3 reps;30 seconds    Quad Stretch Limitations  passive in prone 30 sec X 3    Gastroc Stretch  Both;3 reps;30 seconds    Gastroc Stretch Limitations  slantboard      Knee/Hip Exercises: Aerobic   Nustep  8 min L7, seat 8      Knee/Hip Exercises: Standing   Lateral Step Up  Both;15 reps;Hand Hold: 1;Step Height: 6"    Step Down  Right;15 reps;Hand Hold: 1;Step Height: 4"    Step Down Limitations  difficutly using bilateral UE's for support at first      Knee/Hip Exercises: Supine   Bridges  AROM;Strengthening;Both;2 sets    Bridges Limitations  ankles on physioball (red)      Manual Therapy   Manual therapy comments  g3-5 mobs for flexion and extension, PROM, manual hamstring and IT band stretching.  PT Short Term Goals - 04/19/19 1426      PT SHORT TERM GOAL #1   Title  she will be indpendent with initial HEP    Time  4    Period  Weeks    Status  Achieved    Target Date  04/17/19      PT SHORT TERM GOAL #2   Title  She will report less than 5/10 overall pain    Time  4    Period  Weeks    Status  Achieved      PT SHORT TERM GOAL #3   Title  WIll improve knee ROM 10-100 to improve function.    Status  Achieved        PT Long Term Goals - 06/13/19 1122      PT LONG TERM GOAL #1   Title  She will be indpendent with all HEP issued. (target for all goals is 05/15/18)    Time  8    Period  Weeks    Status  Achieved      PT LONG TERM GOAL #2   Title  Will reduce pain to overall less than 3/10 with ususal activity.    Time  6    Period  Weeks      PT LONG TERM GOAL #3   Title  Pt will be able to ambulate community distances no AD WFL gait pattern and be able to negotiate one flight of stairs reciprocally.    Time  8    Period  Weeks    Status  Partially Met      PT LONG TERM GOAL #4   Title  Pt will improve Rt knee  AROM 3-115 to improve function.    Time  8    Period  Weeks    Status  On-going      PT LONG TERM GOAL #5   Title  Pt will increased Rt knee strength to 5/5 and Rt hip strength to 4+/5 MMT to improve function    Time  6    Period  Weeks            Plan - 06/13/19 1115    Clinical Impression Statement  Pt arriving to therapy reporing only soreness 1/10 pain. Pt reporting that her plantar fascitis is acting up in her L foot. Pt toleraing well with mild complaints of R LE fatigue.  Continue to progress toward LTG's.    Personal Factors and Comorbidities  Comorbidity 3+    Comorbidities  PMH: Rt TKA 03/04/19, Ca,lumbar DDD, osteopenia,    Examination-Activity Limitations  Squat;Stairs;Stand;Lift;Dressing;Locomotion Level;Transfers    Examination-Participation Restrictions  Cleaning;Meal Prep;Community Activity;Driving;Laundry;Shop    Stability/Clinical Decision Making  Evolving/Moderate complexity    Rehab Potential  Good    PT Frequency  3x / week    PT Duration  8 weeks    PT Treatment/Interventions  ADLs/Self Care Home Management;Cryotherapy;Moist Heat;Gait training;Stair training;Therapeutic activities;Therapeutic exercise;Balance training;Neuromuscular re-education;Manual techniques;Passive range of motion;Dry needling;Joint Manipulations;Taping;Vasopneumatic Device    PT Next Visit Plan  Low load long duration extension stretching, stairs, squats, SLS    PT Home Exercise Plan  Access Code: [facility]    Consulted and Agree with Plan of Care  Patient       Patient will benefit from skilled therapeutic intervention in order to improve the following deficits and impairments:  Abnormal gait, Decreased activity tolerance, Decreased balance, Decreased mobility, Decreased endurance, Decreased range of motion, Decreased strength, Increased edema, Difficulty  walking, Increased fascial restricitons, Increased muscle spasms, Impaired flexibility, Pain  Visit Diagnosis: Acute pain of right  knee  Stiffness of right knee, not elsewhere classified  Other abnormalities of gait and mobility  Muscle weakness (generalized)  Localized edema  Difficulty in walking, not elsewhere classified  Right knee pain, unspecified chronicity     Problem List Patient Active Problem List   Diagnosis Date Noted  . Status post total hip replacement, right 03/05/2019  . Arthritis of right knee 03/04/2019  . Unilateral primary osteoarthritis, right knee 02/05/2019  . Knee locking, right 01/09/2019  . Metatarsal stress fracture, right, initial encounter 10/03/2016  . DJD (degenerative joint disease) 06/09/2016  . Labile hypertension 08/01/2014  . Vitamin D deficiency 08/01/2014  . Medication management 08/01/2014  . Obesity 07/04/2014  . Hyperlipemia 06/27/2013  . Attention deficit disorder (ADD)   . GERD (gastroesophageal reflux disease)   . Allergy   . Depression, major, in remission ([place])   . Anemia   . Ocular rosacea   . HSV infection   . Osteopenia     Oretha Caprice MPT 06/13/2019, 12:42 PM  [facility] Physical Therapy [address] [place], [place], [address] Phone: [phone]   Fax:  [phone]  Name: Kathy Cook MRN: [id number] Date of Birth: 25-Nov-1945

## 2019-06-19 ENCOUNTER — Ambulatory Visit: Payer: Medicare Other | Admitting: Physical Therapy

## 2019-06-19 ENCOUNTER — Other Ambulatory Visit: Payer: Self-pay

## 2019-06-19 DIAGNOSIS — R600 Localized edema: Secondary | ICD-10-CM

## 2019-06-19 DIAGNOSIS — R2689 Other abnormalities of gait and mobility: Secondary | ICD-10-CM | POA: Diagnosis not present

## 2019-06-19 DIAGNOSIS — M25561 Pain in right knee: Secondary | ICD-10-CM

## 2019-06-19 DIAGNOSIS — M6281 Muscle weakness (generalized): Secondary | ICD-10-CM

## 2019-06-19 DIAGNOSIS — M25661 Stiffness of right knee, not elsewhere classified: Secondary | ICD-10-CM

## 2019-06-19 NOTE — Therapy (Signed)
[facility] Physical Therapy [address] [place], [place], [address] Phone: [phone]   Fax:  [phone]  Physical Therapy Treatment  Patient Details  Name: Kathy Cook MRN: [id number] Date of Birth: 1945/10/08 Referring Provider (PT): [name], MD   Encounter Date: 06/19/2019  PT End of Session - 06/19/19 1233    Visit Number  29    Number of Visits  35    Date for PT Re-Evaluation  07/09/19    Authorization Type  [employer] MCR, last progress note done visit 19.    PT Start Time  1100    PT Stop Time  1145    PT Time Calculation (min)  45 min    Activity Tolerance  Patient tolerated treatment well       Past Medical History:  Diagnosis Date  . Allergy   . Anxiety   . Attention deficit disorder (ADD)   . Cancer [facility])    ? basal/squam cell on chest, and forehead  . Depression   . Dry eyes   . GERD (gastroesophageal reflux disease)   . HSV infection   . Lumbar degenerative disc disease   . Ocular rosacea   . Osteopenia     Past Surgical History:  Procedure Laterality Date  . SKIN BIOPSY     ?basal/squam cell on chest  . TONSILLECTOMY AND ADENOIDECTOMY    . TOTAL KNEE ARTHROPLASTY Right 03/04/2019   Procedure: RIGHT TOTAL KNEE ARTHROPLASTY;  Surgeon: [name], MD;  Location: [place];  Service: Orthopedics;  Laterality: Right;    There were no vitals filed for this visit.  Subjective Assessment - 06/19/19 1233    Subjective  no pain but sorness, and fatigue in her knee    Pertinent History  PMH: Rt TKA 03/04/19, Ca,lumbar DDD, osteopenia,    How long can you sit comfortably?  20    How long can you stand comfortably?  5 min    Patient Stated Goals  get the pain down and get back to normsl    Pain Onset  More than a month ago                       [facility] Adult PT Treatment/Exercise - 06/19/19 0001      Knee/Hip Exercises: Stretches   Active Hamstring Stretch  Right;3 reps;30 seconds    Passive Hamstring Stretch   Right;3 reps;30 seconds    Quad Stretch Limitations  passive in prone 30 sec X 3    Gastroc Stretch  Both;3 reps;30 seconds    Gastroc Stretch Limitations  slantboard      Knee/Hip Exercises: Aerobic   Nustep  10 min L7, seat 8      Knee/Hip Exercises: Machines for Strengthening   Total Gym Leg Press  Rt leg only 3 X10 56 lbs      Knee/Hip Exercises: Standing   Lateral Step Up  Both;15 reps;Hand Hold: 1;Step Height: 6"    Step Down  Right;15 reps;Hand Hold: 1;Step Height: 6"    Other Standing Knee Exercises  up down stairs reciproally bilat UE support in hall X 3      Knee/Hip Exercises: Seated   Sit to Sand  without UE support;2 sets;10 reps   Rt leg further behind for more wt shift              PT Short Term Goals - 04/19/19 1426      PT SHORT TERM GOAL #1  Title  she will be indpendent with initial HEP    Time  4    Period  Weeks    Status  Achieved    Target Date  04/17/19      PT SHORT TERM GOAL #2   Title  She will report less than 5/10 overall pain    Time  4    Period  Weeks    Status  Achieved      PT SHORT TERM GOAL #3   Title  WIll improve knee ROM 10-100 to improve function.    Status  Achieved        PT Long Term Goals - 06/13/19 1122      PT LONG TERM GOAL #1   Title  She will be indpendent with all HEP issued. (target for all goals is 05/15/18)    Time  8    Period  Weeks    Status  Achieved      PT LONG TERM GOAL #2   Title  Will reduce pain to overall less than 3/10 with ususal activity.    Time  6    Period  Weeks      PT LONG TERM GOAL #3   Title  Pt will be able to ambulate community distances no AD WFL gait pattern and be able to negotiate one flight of stairs reciprocally.    Time  8    Period  Weeks    Status  Partially Met      PT LONG TERM GOAL #4   Title  Pt will improve Rt knee AROM 3-115 to improve function.    Time  8    Period  Weeks    Status  On-going      PT LONG TERM GOAL #5   Title  Pt will increased Rt  knee strength to 5/5 and Rt hip strength to 4+/5 MMT to improve function    Time  6    Period  Weeks            Plan - 06/19/19 1233    Clinical Impression Statement  Continued to focus on Rt knee ROM, strength, and endurance with functional strenghtening and stretching and manual. PT will continue to maximize function in her Rt knee as much as possible.    Personal Factors and Comorbidities  Comorbidity 3+    Comorbidities  PMH: Rt TKA 03/04/19, Ca,lumbar DDD, osteopenia,    Examination-Activity Limitations  Squat;Stairs;Stand;Lift;Dressing;Locomotion Level;Transfers    Examination-Participation Restrictions  Cleaning;Meal Prep;Community Activity;Driving;Laundry;Shop    Stability/Clinical Decision Making  Evolving/Moderate complexity    Rehab Potential  Good    PT Frequency  3x / week    PT Duration  8 weeks    PT Treatment/Interventions  ADLs/Self Care Home Management;Cryotherapy;Moist Heat;Gait training;Stair training;Therapeutic activities;Therapeutic exercise;Balance training;Neuromuscular re-education;Manual techniques;Passive range of motion;Dry needling;Joint Manipulations;Taping;Vasopneumatic Device    PT Next Visit Plan  Low load long duration extension stretching, stairs, squats, SLS    PT Home Exercise Plan  Access Code: [facility]    Consulted and Agree with Plan of Care  Patient       Patient will benefit from skilled therapeutic intervention in order to improve the following deficits and impairments:  Abnormal gait, Decreased activity tolerance, Decreased balance, Decreased mobility, Decreased endurance, Decreased range of motion, Decreased strength, Increased edema, Difficulty walking, Increased fascial restricitons, Increased muscle spasms, Impaired flexibility, Pain  Visit Diagnosis: Acute pain of right knee  Stiffness of right knee, not elsewhere classified  Other abnormalities of gait and mobility  Muscle weakness (generalized)  Localized edema     Problem  List Patient Active Problem List   Diagnosis Date Noted  . Status post total hip replacement, right 03/05/2019  . Arthritis of right knee 03/04/2019  . Unilateral primary osteoarthritis, right knee 02/05/2019  . Knee locking, right 01/09/2019  . Metatarsal stress fracture, right, initial encounter 10/03/2016  . DJD (degenerative joint disease) 06/09/2016  . Labile hypertension 08/01/2014  . Vitamin D deficiency 08/01/2014  . Medication management 08/01/2014  . Obesity 07/04/2014  . Hyperlipemia 06/27/2013  . Attention deficit disorder (ADD)   . GERD (gastroesophageal reflux disease)   . Allergy   . Depression, major, in remission ([place])   . Anemia   . Ocular rosacea   . HSV infection   . Osteopenia     Silvestre Mesi 06/19/2019, 12:35 PM  [facility] Physical Therapy [address] [place], [place], [address] Phone: [phone]   Fax:  [phone]  Name: Kathy Cook MRN: [id number] Date of Birth: 24-Jun-1945

## 2019-06-21 ENCOUNTER — Other Ambulatory Visit: Payer: Self-pay | Admitting: Surgery

## 2019-06-21 ENCOUNTER — Encounter: Payer: Medicare Other | Admitting: Physical Therapy

## 2019-06-21 DIAGNOSIS — N6320 Unspecified lump in the left breast, unspecified quadrant: Secondary | ICD-10-CM | POA: Diagnosis not present

## 2019-06-25 ENCOUNTER — Encounter: Payer: Self-pay | Admitting: Physical Therapy

## 2019-06-25 ENCOUNTER — Ambulatory Visit: Payer: Medicare Other | Admitting: Physical Therapy

## 2019-06-25 ENCOUNTER — Other Ambulatory Visit: Payer: Self-pay

## 2019-06-25 DIAGNOSIS — M6281 Muscle weakness (generalized): Secondary | ICD-10-CM

## 2019-06-25 DIAGNOSIS — R2689 Other abnormalities of gait and mobility: Secondary | ICD-10-CM

## 2019-06-25 DIAGNOSIS — R262 Difficulty in walking, not elsewhere classified: Secondary | ICD-10-CM

## 2019-06-25 DIAGNOSIS — R600 Localized edema: Secondary | ICD-10-CM

## 2019-06-25 DIAGNOSIS — M25561 Pain in right knee: Secondary | ICD-10-CM

## 2019-06-25 DIAGNOSIS — M25661 Stiffness of right knee, not elsewhere classified: Secondary | ICD-10-CM

## 2019-06-25 NOTE — Therapy (Signed)
[facility] Physical Therapy [address] [place], [place], [address] Phone: [phone]   Fax:  [phone]  Physical Therapy Treatment/Progress note Progress Note reporting period date 05/20/19 to 06/25/19  See below for objective and subjective measurements relating to patients progress with PT.   Patient Details  Name: Kathy Cook MRN: [id number] Date of Birth: [birth year] Referring Provider (PT): [name], MD   Encounter Date: 06/25/2019  PT End of Session - 06/25/19 1158    Visit Number  30    Number of Visits  35    Date for PT Re-Evaluation  07/09/19    Authorization Type  [employer] MCR    Progress Note Due on Visit  40    PT Start Time  1100    PT Stop Time  1200    PT Time Calculation (min)  60 min    Activity Tolerance  Patient tolerated treatment well       Past Medical History:  Diagnosis Date  . Allergy   . Anxiety   . Attention deficit disorder (ADD)   . Cancer [facility])    ? basal/squam cell on chest, and forehead  . Depression   . Dry eyes   . GERD (gastroesophageal reflux disease)   . HSV infection   . Lumbar degenerative disc disease   . Ocular rosacea   . Osteopenia     Past Surgical History:  Procedure Laterality Date  . SKIN BIOPSY     ?basal/squam cell on chest  . TONSILLECTOMY AND ADENOIDECTOMY    . TOTAL KNEE ARTHROPLASTY Right 03/04/2019   Procedure: RIGHT TOTAL KNEE ARTHROPLASTY;  Surgeon: [name], MD;  Location: [place];  Service: Orthopedics;  Laterality: Right;    There were no vitals filed for this visit.  Subjective Assessment - 06/25/19 1109    Subjective  no pain but sorenss, I feel like my knee is 96% better since starting PT, feel I have turned a corner this week    Pertinent History  PMH: Rt TKA 03/04/19, Ca,lumbar DDD, osteopenia,    How long can you sit comfortably?  20    How long can you stand comfortably?  5 min    Patient Stated Goals  get the pain down and get back to normsl    Pain Onset  More  than a month ago         [facility] PT Assessment - 06/25/19 0001      Assessment   Medical Diagnosis  Rt TKA    Referring Provider (PT)  [name], MD    Onset Date/Surgical Date  03/04/19      AROM   Right Knee Extension  -5    Right Knee Flexion  115      PROM   Right Knee Extension  -3    Right Knee Flexion  120      Strength   Overall Strength Comments  Rt hip strength overall 4+/5, Rt knee strength 5-/5         [facility] Adult PT Treatment/Exercise - 06/25/19 0001      Knee/Hip Exercises: Stretches   Active Hamstring Stretch  Right;3 reps;30 seconds    Passive Hamstring Stretch  Right;3 reps;30 seconds    Gastroc Stretch  Both;3 reps;30 seconds    Gastroc Stretch Limitations  slantboard      Knee/Hip Exercises: Aerobic   Recumbent Bike  15 minutes full revolutions for knee ROM      Knee/Hip Exercises: Machines  for Strengthening   Total Gym Leg Press  Rt leg only 3 X10 56 lbs, after each set performed bilat leg press at same wt      Knee/Hip Exercises: Standing   Step Down  Right;Hand Hold: 1;Step Height: 6";20 reps    Other Standing Knee Exercises  TRX squats 2X15      Manual Therapy   Manual therapy comments  Rt knee PROM, extension mobs grade 3-5, manual hamstring stretching               PT Short Term Goals - 06/25/19 1211      PT SHORT TERM GOAL #1   Title  she will be indpendent with initial HEP    Time  4    Period  Weeks    Status  Achieved    Target Date  04/17/19      PT SHORT TERM GOAL #2   Title  She will report less than 5/10 overall pain    Time  4    Period  Weeks    Status  Achieved      PT SHORT TERM GOAL #3   Title  WIll improve knee ROM 10-100 to improve function.    Status  Achieved        PT Long Term Goals - 06/25/19 1211      PT LONG TERM GOAL #1   Title  She will be indpendent with all HEP issued. (target for all goals is 05/15/18)    Time  8    Period  Weeks    Status  Achieved      PT LONG TERM GOAL #2    Title  Will reduce pain to overall less than 3/10 with ususal activity.    Time  6    Period  Weeks    Status  Achieved      PT LONG TERM GOAL #3   Title  Pt will be able to ambulate community distances no AD WFL gait pattern and be able to negotiate one flight of stairs reciprocally.    Time  8    Period  Weeks    Status  Partially Met      PT LONG TERM GOAL #4   Title  Pt will improve Rt knee AROM 3-115 to improve function.    Time  8    Period  Weeks    Status  On-going      PT LONG TERM GOAL #5   Title  Pt will increased Rt knee strength to 5/5 and Rt hip strength to 4+/5 MMT to improve function    Time  6    Period  Weeks    Status  Partially Met            Plan - 06/25/19 1212    Clinical Impression Statement  Progress note shows improvements in knee flexion ROM, endurance, and overall strength. However she is still missing knee extension ROM and has difficutly with stairs. She has met 3/5 PT goals and is close to meeting her remaining goals. She will benefit from 2-3 more weeks of PT to maximize knee ROM and function.    Personal Factors and Comorbidities  Comorbidity 3+    Comorbidities  PMH: Rt TKA 03/04/19, Ca,lumbar DDD, osteopenia,    Examination-Activity Limitations  Squat;Stairs;Stand;Lift;Dressing;Locomotion Level;Transfers    Examination-Participation Restrictions  Cleaning;Meal Prep;Community Activity;Driving;Laundry;Shop    Stability/Clinical Decision Making  Evolving/Moderate complexity    Rehab Potential  Good  PT Frequency  3x / week    PT Duration  8 weeks    PT Treatment/Interventions  ADLs/Self Care Home Management;Cryotherapy;Moist Heat;Gait training;Stair training;Therapeutic activities;Therapeutic exercise;Balance training;Neuromuscular re-education;Manual techniques;Passive range of motion;Dry needling;Joint Manipulations;Taping;Vasopneumatic Device    PT Next Visit Plan  Low load long duration extension stretching, stairs, squats, SLS    PT Home  Exercise Plan  Access Code: [facility]    Consulted and Agree with Plan of Care  Patient       Patient will benefit from skilled therapeutic intervention in order to improve the following deficits and impairments:  Abnormal gait, Decreased activity tolerance, Decreased balance, Decreased mobility, Decreased endurance, Decreased range of motion, Decreased strength, Increased edema, Difficulty walking, Increased fascial restricitons, Increased muscle spasms, Impaired flexibility, Pain  Visit Diagnosis: Acute pain of right knee  Stiffness of right knee, not elsewhere classified  Other abnormalities of gait and mobility  Muscle weakness (generalized)  Localized edema  Difficulty in walking, not elsewhere classified  Right knee pain, unspecified chronicity     Problem List Patient Active Problem List   Diagnosis Date Noted  . Status post total hip replacement, right 03/05/2019  . Arthritis of right knee 03/04/2019  . Unilateral primary osteoarthritis, right knee 02/05/2019  . Knee locking, right 01/09/2019  . Metatarsal stress fracture, right, initial encounter 10/03/2016  . DJD (degenerative joint disease) 06/09/2016  . Labile hypertension 08/01/2014  . Vitamin D deficiency 08/01/2014  . Medication management 08/01/2014  . Obesity 07/04/2014  . Hyperlipemia 06/27/2013  . Attention deficit disorder (ADD)   . GERD (gastroesophageal reflux disease)   . Allergy   . Depression, major, in remission ([place])   . Anemia   . Ocular rosacea   . HSV infection   . Osteopenia     Silvestre Mesi 06/25/2019, 12:16 PM  [facility] Physical Therapy [address] [place], [place], [address] Phone: [phone]   Fax:  [phone]  Name: Kathy Cook MRN: [id number] Date of Birth: [birth year]

## 2019-06-28 ENCOUNTER — Encounter: Payer: Medicare Other | Admitting: Physical Therapy

## 2019-07-02 ENCOUNTER — Telehealth: Payer: Self-pay | Admitting: Physical Therapy

## 2019-07-02 ENCOUNTER — Encounter: Payer: Medicare Other | Admitting: Physical Therapy

## 2019-07-02 NOTE — Telephone Encounter (Signed)
Pt no show for PT appointment today. They were contacted and informed of this via voicemail. They were provided the date and time of their next appointment on voicemail. They then called back and spoke with front office staff and relays she did not have this appointment recorded in her book.  Elsie Ra, PT, DPT 07/02/19 2:49 PM

## 2019-07-04 ENCOUNTER — Other Ambulatory Visit: Payer: Self-pay

## 2019-07-04 ENCOUNTER — Ambulatory Visit: Payer: Medicare Other | Admitting: Physical Therapy

## 2019-07-04 ENCOUNTER — Encounter: Payer: Self-pay | Admitting: Physical Therapy

## 2019-07-04 DIAGNOSIS — M25561 Pain in right knee: Secondary | ICD-10-CM

## 2019-07-04 DIAGNOSIS — R262 Difficulty in walking, not elsewhere classified: Secondary | ICD-10-CM

## 2019-07-04 DIAGNOSIS — R2689 Other abnormalities of gait and mobility: Secondary | ICD-10-CM

## 2019-07-04 DIAGNOSIS — M25661 Stiffness of right knee, not elsewhere classified: Secondary | ICD-10-CM

## 2019-07-04 DIAGNOSIS — R600 Localized edema: Secondary | ICD-10-CM

## 2019-07-04 DIAGNOSIS — M6281 Muscle weakness (generalized): Secondary | ICD-10-CM

## 2019-07-04 NOTE — Therapy (Signed)
[facility] Physical Therapy [address] [place], [place], [address] Phone: [phone]   Fax:  [phone]  Physical Therapy Treatment  Patient Details  Name: Kathy Cook MRN: [id number] Date of Birth: 23-Aug-1945 Referring Provider (PT): [name], MD   Encounter Date: 07/04/2019  PT End of Session - 07/04/19 1430    Visit Number  31    Number of Visits  35    Date for PT Re-Evaluation  07/09/19    Authorization Type  UHC MCR    PT Start Time  2694    PT Stop Time  1430    PT Time Calculation (min)  38 min    Activity Tolerance  Patient tolerated treatment well    Behavior During Therapy  [facility] for tasks assessed/performed       Past Medical History:  Diagnosis Date  . Allergy   . Anxiety   . Attention deficit disorder (ADD)   . Cancer [facility])    ? basal/squam cell on chest, and forehead  . Depression   . Dry eyes   . GERD (gastroesophageal reflux disease)   . HSV infection   . Lumbar degenerative disc disease   . Ocular rosacea   . Osteopenia     Past Surgical History:  Procedure Laterality Date  . SKIN BIOPSY     ?basal/squam cell on chest  . TONSILLECTOMY AND ADENOIDECTOMY    . TOTAL KNEE ARTHROPLASTY Right 03/04/2019   Procedure: RIGHT TOTAL KNEE ARTHROPLASTY;  Surgeon: [name], MD;  Location: [place];  Service: Orthopedics;  Laterality: Right;    There were no vitals filed for this visit.  Subjective Assessment - 07/04/19 1428    Subjective  Pt arriving to therapy asking about joining a gym after she completes therapy.    Pertinent History  PMH: Rt TKA 03/04/19, Ca,lumbar DDD, osteopenia,    How long can you sit comfortably?  20    How long can you stand comfortably?  5 min    Patient Stated Goals  get the pain down and get back to normsl    Currently in Pain?  No/denies                       [facility] Adult PT Treatment/Exercise - 07/04/19 0001      Knee/Hip Exercises: Stretches   Active Hamstring Stretch   Right;3 reps;30 seconds    Gastroc Stretch  Both;3 reps;30 seconds    Gastroc Stretch Limitations  slantboard      Knee/Hip Exercises: Aerobic   Recumbent Bike  6 minutes full revolutions      Knee/Hip Exercises: Machines for Strengthening   Total Gym Leg Press  R LE only 10 reps 60#, bilateral LE's 100# x 30 reps      Knee/Hip Exercises: Standing   Step Down  Right;Hand Hold: 1;Step Height: 6";20 reps    Other Standing Knee Exercises  TRX squats 2X15      Manual Therapy   Manual therapy comments  PROM: over pressure with extension             PT Education - 07/04/19 1429    Education Details  Edu about [employer] with her [facility] [employer]) Educated  Patient    Methods  Explanation    Comprehension  Verbalized understanding       PT Short Term Goals - 06/25/19 1211      PT SHORT TERM GOAL #  1   Title  she will be indpendent with initial HEP    Time  4    Period  Weeks    Status  Achieved    Target Date  04/17/19      PT SHORT TERM GOAL #2   Title  She will report less than 5/10 overall pain    Time  4    Period  Weeks    Status  Achieved      PT SHORT TERM GOAL #3   Title  WIll improve knee ROM 10-100 to improve function.    Status  Achieved        PT Long Term Goals - 06/25/19 1211      PT LONG TERM GOAL #1   Title  She will be indpendent with all HEP issued. (target for all goals is 05/15/18)    Time  8    Period  Weeks    Status  Achieved      PT LONG TERM GOAL #2   Title  Will reduce pain to overall less than 3/10 with ususal activity.    Time  6    Period  Weeks    Status  Achieved      PT LONG TERM GOAL #3   Title  Pt will be able to ambulate community distances no AD WFL gait pattern and be able to negotiate one flight of stairs reciprocally.    Time  8    Period  Weeks    Status  Partially Met      PT LONG TERM GOAL #4   Title  Pt will improve Rt knee AROM 3-115 to improve function.    Time  8    Period   Weeks    Status  On-going      PT LONG TERM GOAL #5   Title  Pt will increased Rt knee strength to 5/5 and Rt hip strength to 4+/5 MMT to improve function    Time  6    Period  Weeks    Status  Partially Met            Plan - 07/04/19 1441    Clinical Impression Statement  Pt arriving reporting no pain only R knee stiffness. Pt is making progress with AROM of 118 degrees flexion and 5 degrees lacking extension. Discharge plan discussed and will be reviewed at next visit. We discussed joining [place] at the [employer].    Personal Factors and Comorbidities  Comorbidity 3+    Comorbidities  PMH: Rt TKA 03/04/19, Ca,lumbar DDD, osteopenia,    Examination-Activity Limitations  Squat;Stairs;Stand;Lift;Dressing;Locomotion Level;Transfers    Examination-Participation Restrictions  Cleaning;Meal Prep;Community Activity;Driving;Laundry;Shop    Stability/Clinical Decision Making  Evolving/Moderate complexity    Rehab Potential  Good    PT Frequency  3x / week    PT Duration  8 weeks    PT Treatment/Interventions  ADLs/Self Care Home Management;Cryotherapy;Moist Heat;Gait training;Stair training;Therapeutic activities;Therapeutic exercise;Balance training;Neuromuscular re-education;Manual techniques;Passive range of motion;Dry needling;Joint Manipulations;Taping;Vasopneumatic Device    PT Next Visit Plan  Low load long duration extension stretching, stairs, squats, SLS    PT Home Exercise Plan  Access Code: [facility]    Consulted and Agree with Plan of Care  Patient       Patient will benefit from skilled therapeutic intervention in order to improve the following deficits and impairments:  Abnormal gait, Decreased activity tolerance, Decreased balance, Decreased mobility, Decreased endurance, Decreased range of motion, Decreased strength, Increased  edema, Difficulty walking, Increased fascial restricitons, Increased muscle spasms, Impaired flexibility, Pain  Visit Diagnosis: Acute pain  of right knee  Stiffness of right knee, not elsewhere classified  Other abnormalities of gait and mobility  Muscle weakness (generalized)  Localized edema  Difficulty in walking, not elsewhere classified  Right knee pain, unspecified chronicity     Problem List Patient Active Problem List   Diagnosis Date Noted  . Status post total hip replacement, right 03/05/2019  . Arthritis of right knee 03/04/2019  . Unilateral primary osteoarthritis, right knee 02/05/2019  . Knee locking, right 01/09/2019  . Metatarsal stress fracture, right, initial encounter 10/03/2016  . DJD (degenerative joint disease) 06/09/2016  . Labile hypertension 08/01/2014  . Vitamin D deficiency 08/01/2014  . Medication management 08/01/2014  . Obesity 07/04/2014  . Hyperlipemia 06/27/2013  . Attention deficit disorder (ADD)   . GERD (gastroesophageal reflux disease)   . Allergy   . Depression, major, in remission ([place])   . Anemia   . Ocular rosacea   . HSV infection   . Osteopenia     Oretha Caprice, PT, MPT 07/04/2019, 2:48 PM  [facility] Physical Therapy [address] [place], [place], [address] Phone: [phone]   Fax:  [phone]  Name: Kathy Cook MRN: [id number] Date of Birth: [birth year]

## 2019-07-10 ENCOUNTER — Other Ambulatory Visit: Payer: Self-pay

## 2019-07-10 ENCOUNTER — Encounter: Payer: Self-pay | Admitting: Physical Therapy

## 2019-07-10 ENCOUNTER — Ambulatory Visit: Payer: Medicare Other | Admitting: Physical Therapy

## 2019-07-10 DIAGNOSIS — R2689 Other abnormalities of gait and mobility: Secondary | ICD-10-CM

## 2019-07-10 DIAGNOSIS — R600 Localized edema: Secondary | ICD-10-CM | POA: Diagnosis not present

## 2019-07-10 DIAGNOSIS — M25661 Stiffness of right knee, not elsewhere classified: Secondary | ICD-10-CM | POA: Diagnosis not present

## 2019-07-10 DIAGNOSIS — R262 Difficulty in walking, not elsewhere classified: Secondary | ICD-10-CM

## 2019-07-10 DIAGNOSIS — M6281 Muscle weakness (generalized): Secondary | ICD-10-CM

## 2019-07-10 DIAGNOSIS — M25561 Pain in right knee: Secondary | ICD-10-CM

## 2019-07-10 NOTE — Therapy (Addendum)
[facility] Physical Therapy [address] [place], [place], [address] Phone: [phone]   Fax:  [phone]  Physical Therapy Treatment/Recert/Discharge addendum PHYSICAL THERAPY DISCHARGE SUMMARY  Visits from Start of Care: 32  Current functional level related to goals / functional outcomes: See below   Remaining deficits: See below   Education / Equipment: HEP Plan: Patient agrees to discharge.  Patient goals were partially met. Patient is being discharged due to not returning since the last visit.  ?????   Elsie Ra, PT, DPT 08/06/19 8:29 AM       Patient Details  Name: Kathy Cook MRN: [id number] Date of Birth: 07-Sep-1945 Referring Provider (PT): [name], MD   Encounter Date: 07/10/2019  PT End of Session - 07/10/19 1038    Visit Number  32    Number of Visits  37    Date for PT Re-Evaluation  07/31/19    Authorization Type  UHC MCR    Progress Note Due on Visit  40    PT Start Time  0930    PT Stop Time  1015    PT Time Calculation (min)  45 min    Activity Tolerance  Patient tolerated treatment well    Behavior During Therapy  [facility] for tasks assessed/performed       Past Medical History:  Diagnosis Date  . Allergy   . Anxiety   . Attention deficit disorder (ADD)   . Cancer [facility])    ? basal/squam cell on chest, and forehead  . Depression   . Dry eyes   . GERD (gastroesophageal reflux disease)   . HSV infection   . Lumbar degenerative disc disease   . Ocular rosacea   . Osteopenia     Past Surgical History:  Procedure Laterality Date  . SKIN BIOPSY     ?basal/squam cell on chest  . TONSILLECTOMY AND ADENOIDECTOMY    . TOTAL KNEE ARTHROPLASTY Right 03/04/2019   Procedure: RIGHT TOTAL KNEE ARTHROPLASTY;  Surgeon: [name], MD;  Location: [place];  Service: Orthopedics;  Laterality: Right;    There were no vitals filed for this visit.  Subjective Assessment - 07/10/19 1028    Subjective  relays her knee is  doing better but still has mild pain, mild endurance deficits, and soreness but it gets better each week. She would like to extend PT plan 3 more weeks while she transitions to joining gym for continued exercise for her Rt knee    Pertinent History  PMH: Rt TKA 03/04/19, Ca,lumbar DDD, osteopenia,    How long can you sit comfortably?  20    How long can you stand comfortably?  5 min    Patient Stated Goals  get the pain down and get back to normsl    Pain Onset  More than a month ago         [facility] PT Assessment - 07/10/19 0001      Assessment   Medical Diagnosis  Rt TKA    Referring Provider (PT)  [name], MD    Onset Date/Surgical Date  03/04/19      AROM   Right Knee Extension  -3    Right Knee Flexion  5      PROM   Right Knee Extension  -2    Right Knee Flexion  116      Strength   Overall Strength Comments  Rt hip strength overall 4+/5, Rt knee strength 5-/5  [facility] Adult PT Treatment/Exercise - 07/10/19 0001      Knee/Hip Exercises: Stretches   Active Hamstring Stretch  Right;3 reps;30 seconds    Passive Hamstring Stretch  Right;3 reps;30 seconds    Gastroc Stretch  Both;3 reps;30 seconds    Gastroc Stretch Limitations  slantboard      Knee/Hip Exercises: Aerobic   Recumbent Bike  10 min L2      Knee/Hip Exercises: Machines for Strengthening   Total Gym Leg Press  R LE only 10X3 reps 62#, bilateral LE's 100# 3X10 reps      Knee/Hip Exercises: Standing   Other Standing Knee Exercises  TRX squats and curtsey lunges 2X10 ea    Other Standing Knee Exercises  fwd and lateal step ups no UE support  X15 ea      Manual Therapy   Manual therapy comments  PROM: over pressure with extension, manual hamstrring stretching               PT Short Term Goals - 07/10/19 1043      PT SHORT TERM GOAL #1   Title  she will be indpendent with initial HEP    Time  4    Period  Weeks    Status  Achieved    Target Date  04/17/19       PT SHORT TERM GOAL #2   Title  She will report less than 5/10 overall pain    Time  4    Period  Weeks    Status  Achieved      PT SHORT TERM GOAL #3   Title  WIll improve knee ROM 10-100 to improve function.    Status  Achieved        PT Long Term Goals - 07/10/19 1043      PT LONG TERM GOAL #1   Title  She will be indpendent with all HEP issued. (target for all goals is 05/15/18)    Time  8    Period  Weeks    Status  Achieved      PT LONG TERM GOAL #2   Title  Will reduce pain to overall less than 3/10 with ususal activity.    Time  6    Period  Weeks    Status  Achieved      PT LONG TERM GOAL #3   Title  Pt will be able to ambulate community distances no AD WFL gait pattern and be able to negotiate one flight of stairs reciprocally.    Time  8    Period  Weeks    Status  Partially Met      PT LONG TERM GOAL #4   Title  Pt will improve Rt knee AROM 3-115 to improve function.    Time  8    Period  Weeks    Status  Achieved      PT LONG TERM GOAL #5   Title  Pt will increased Rt knee strength to 5/5 and Rt hip strength to 4+/5 MMT to improve function    Time  6    Period  Weeks    Status  Partially Met            Plan - 07/10/19 1039    Clinical Impression Statement  Recert performed today as her POC had ended but still had remaining visits. She has overall made good progress with PT but continues to lack some knee extension ROM and endurance limiting her  full function. PT feels that skilled PT continues to be medically necessary and PT recommending 3 more weeks of PT to allow her to reach her remaining goals and transition to joining gym for continued exercise for her Rt knee.    Personal Factors and Comorbidities  Comorbidity 3+    Comorbidities  PMH: Rt TKA 03/04/19, Ca,lumbar DDD, osteopenia,    Examination-Activity Limitations  Squat;Stairs;Stand;Lift;Dressing;Locomotion Level;Transfers    Examination-Participation Restrictions  Cleaning;Meal Prep;Community  Activity;Driving;Laundry;Shop    Stability/Clinical Decision Making  Evolving/Moderate complexity    Rehab Potential  Good    PT Frequency  3x / week    PT Duration  8 weeks    PT Treatment/Interventions  ADLs/Self Care Home Management;Cryotherapy;Moist Heat;Gait training;Stair training;Therapeutic activities;Therapeutic exercise;Balance training;Neuromuscular re-education;Manual techniques;Passive range of motion;Dry needling;Joint Manipulations;Taping;Vasopneumatic Device    PT Next Visit Plan  functional strength and endurance, extension stretching    PT Home Exercise Plan  Access Code: [facility]    Consulted and Agree with Plan of Care  Patient       Patient will benefit from skilled therapeutic intervention in order to improve the following deficits and impairments:  Abnormal gait, Decreased activity tolerance, Decreased balance, Decreased mobility, Decreased endurance, Decreased range of motion, Decreased strength, Increased edema, Difficulty walking, Increased fascial restricitons, Increased muscle spasms, Impaired flexibility, Pain  Visit Diagnosis: Acute pain of right knee - Plan: PT plan of care cert/re-cert  Stiffness of right knee, not elsewhere classified - Plan: PT plan of care cert/re-cert  Other abnormalities of gait and mobility - Plan: PT plan of care cert/re-cert  Muscle weakness (generalized) - Plan: PT plan of care cert/re-cert  Localized edema - Plan: PT plan of care cert/re-cert  Difficulty in walking, not elsewhere classified - Plan: PT plan of care cert/re-cert  Right knee pain, unspecified chronicity     Problem List Patient Active Problem List   Diagnosis Date Noted  . Status post total hip replacement, right 03/05/2019  . Arthritis of right knee 03/04/2019  . Unilateral primary osteoarthritis, right knee 02/05/2019  . Knee locking, right 01/09/2019  . Metatarsal stress fracture, right, initial encounter 10/03/2016  . DJD (degenerative joint disease)  06/09/2016  . Labile hypertension 08/01/2014  . Vitamin D deficiency 08/01/2014  . Medication management 08/01/2014  . Obesity 07/04/2014  . Hyperlipemia 06/27/2013  . Attention deficit disorder (ADD)   . GERD (gastroesophageal reflux disease)   . Allergy   . Depression, major, in remission ([place])   . Anemia   . Ocular rosacea   . HSV infection   . Osteopenia     Silvestre Mesi 07/10/2019, 10:50 AM  [facility] Physical Therapy [address] [place], [place], [address] Phone: [phone]   Fax:  [phone]  Name: Kathy Cook MRN: [id number] Date of Birth: [birth year]

## 2019-07-11 ENCOUNTER — Other Ambulatory Visit: Payer: Self-pay

## 2019-07-11 ENCOUNTER — Encounter (HOSPITAL_BASED_OUTPATIENT_CLINIC_OR_DEPARTMENT_OTHER): Payer: Self-pay | Admitting: Surgery

## 2019-07-15 ENCOUNTER — Other Ambulatory Visit (HOSPITAL_COMMUNITY)
Admission: RE | Admit: 2019-07-15 | Discharge: 2019-07-15 | Disposition: A | Discharge status: Home / Self Care | Payer: Medicare Other | Source: Ambulatory Visit | Attending: Surgery | Admitting: Surgery

## 2019-07-15 DIAGNOSIS — Z01812 Encounter for preprocedural laboratory examination: Secondary | ICD-10-CM | POA: Diagnosis not present

## 2019-07-15 DIAGNOSIS — Z20822 Contact with and (suspected) exposure to covid-19: Secondary | ICD-10-CM | POA: Insufficient documentation

## 2019-07-16 LAB — SARS CORONAVIRUS 2 (TAT 6-24 HRS): SARS Coronavirus 2: NEGATIVE

## 2019-07-17 MED ORDER — ENSURE PRE-SURGERY PO LIQD: 296.0000 mL | Freq: Once | ORAL | Status: DC

## 2019-07-17 NOTE — H&P (Signed)
Jerre Simon Appointment: 06/21/2019 11:00 AM Location: [facility] Patient #: [id number] DOB: 1945-10-19 Married / Language: Kathy Cook / Race: White Female   History of Present Illness (Ronnesha Mester A. Ninfa Linden MD; 06/21/2019 11:41 AM) The patient is a 74 year old female who presents with a breast mass.  Chief complaint: Left breast mass  This is a 74 year old female who presents with a new left breast mass. She only recently felt a mass. It was not present on films that she reports were done years ago. She underwent mammogram and ultrasound showing a 4.7 x 2.8 x 4.6 cm irregular mass in the 12:30 position of the left breast 4 cm from the nipple. A biopsy was performed of this showing a fibroadenoma. This was felt to be discordant by the radiologist. She denies nipple discharge. She reports having had a previous benign mass removed from the left breast over 30 years ago. She reports having significant tenderness and pain in her breast chronically. This is worse with caffeine. She has no cardiopulmonary problems.   Past Surgical History (Tanisha A. Owens Shark, [place]; 06/21/2019 11:14 AM) Knee Surgery  Right.  Diagnostic Studies History (Tanisha A. Owens Shark, [place]; 06/21/2019 11:14 AM) Colonoscopy  1-5 years ago Pap Smear  1-5 years ago  Allergies (Tanisha A. Owens Shark, [place]; 06/21/2019 11:14 AM) predniSONE *CORTICOSTEROIDS*  Allergies Reconciled   Medication History (Tanisha A. Owens Shark, RMA; 06/21/2019 11:16 AM) [place] (Transdermal) Active. Progesterone Micronized (10% Cream, Transdermal) Active. Adderall (Oral) Specific strength unknown - Active. Medications Reconciled  Social History (Tanisha A. Owens Shark, [place]; 06/21/2019 11:14 AM) Alcohol use  Moderate alcohol use. Caffeine use  Carbonated beverages, Tea. No drug use  Tobacco use  Former smoker.  Family History (Tanisha A. Owens Shark, [place]; 06/21/2019 11:14 AM) Hypertension  Mother.  Pregnancy / Birth  History (Tanisha A. Owens Shark, [place]; 06/21/2019 11:14 AM) Age at menarche  20 years. Age of menopause  34-50 Contraceptive History  Oral contraceptives. Gravida  2 Maternal age  11-30 Para  2  Other Problems (Tanisha A. Owens Shark, [place]; 06/21/2019 11:14 AM) Gastroesophageal Reflux Disease  Hypercholesterolemia  Lump In Breast  Other disease, cancer, significant illness     Review of Systems (Tanisha A. Brown RMA; 06/21/2019 11:14 AM) General Present- Fatigue. Not Present- Appetite Loss, Chills, Fever, Night Sweats, Weight Gain and Weight Loss. Skin Not Present- Change in Wart/Mole, Dryness, Hives, Jaundice, New Lesions, Non-Healing Wounds, Rash and Ulcer. HEENT Present- Seasonal Allergies. Not Present- Earache, Hearing Loss, Hoarseness, Nose Bleed, Oral Ulcers, Ringing in the Ears, Sinus Pain, Sore Throat, Visual Disturbances, Wears glasses/contact lenses and Yellow Eyes. Breast Present- Breast Mass. Not Present- Breast Pain, Nipple Discharge and Skin Changes. Cardiovascular Not Present- Chest Pain, Difficulty Breathing Lying Down, Leg Cramps, Palpitations, Rapid Heart Rate, Shortness of Breath and Swelling of Extremities. Gastrointestinal Not Present- Abdominal Pain, Bloating, Bloody Stool, Change in Bowel Habits, Chronic diarrhea, Constipation, Difficulty Swallowing, Excessive gas, Gets full quickly at meals, Hemorrhoids, Indigestion, Nausea, Rectal Pain and Vomiting. Female Genitourinary Not Present- Frequency, Nocturia, Painful Urination, Pelvic Pain and Urgency. Musculoskeletal Present- Muscle Weakness. Not Present- Back Pain, Joint Pain, Joint Stiffness, Muscle Pain and Swelling of Extremities. Neurological Not Present- Decreased Memory, Fainting, Headaches, Numbness, Seizures, Tingling, Tremor, Trouble walking and Weakness. Endocrine Not Present- Cold Intolerance, Excessive Hunger, Hair Changes, Heat Intolerance, Hot flashes and New Diabetes.  Vitals (Tanisha A. Brown RMA; 06/21/2019  11:16 AM) 06/21/2019 11:16 AM Weight: 191.8 lb Height: 62in Body Surface Area: 1.88 m Body Mass Index: 35.08 kg/m  Temp.: 98.67F  Pulse: 73 (Regular)  BP: 126/84(Sitting, Left Arm, Standard)       Physical Exam (Willaim Mode A. Ninfa Linden MD; 06/21/2019 11:41 AM) The physical exam findings are as follows: Note: Generally she is well appearance  On examination of the left breast, there is a large almost 5 cm mass in the 12:30 position. It is mobile but the margins are indistinct. There are no skin changes. The nipple areolar complex is normal. There is no axillary adenopathy.    Assessment & Plan (Hawkin Charo A. Ninfa Linden MD; 06/21/2019 11:43 AM) LEFT BREAST MASS (N63.20) Impression: I have reviewed the patient's mammogram, ultrasound, pathology results. I gave her a copy of the pathology results. Again, the initial biopsy shows fibroadenoma but this is felt to be discordant. I agree based on the films and physical examination. This may represent invasive cancer or sarcoma. It is worrisome that this is the growing so fast. I strongly recommend proceeding with a left breast lumpectomy for complete removal of the mass for complete histologic evaluation trauma malignancy. I discussed the reasons for this with her in detail. I discussed the diagnosis in detail. I discussed a left breast lumpectomy detail. I discussed the risks which includes but is not limited to bleeding, infection, injury to surrounding structures, recurrence, the need for further procedures if malignancy is found, cardiopulmonary issues, postoperative recovery, etc. After long discussion, she wished to proceed with surgery which will be scheduled. This patient encounter took 45 minutes today to perform the following: take history, perform exam, review outside records, interpret imaging, counsel the patient on their diagnosis and document encounter, findings & plan in the EHR

## 2019-07-17 NOTE — Progress Notes (Signed)
      Enhanced Recovery after Surgery for Orthopedics Enhanced Recovery after Surgery is a protocol used to improve the stress on your body and your recovery after surgery.  Patient Instructions  . The night before surgery:  o No food after midnight. ONLY clear liquids after midnight  . The day of surgery (if you do NOT have diabetes):  o Drink ONE (1) Pre-Surgery Clear Ensure as directed.   o This drink was given to you during your hospital  pre-op appointment visit. o The pre-op nurse will instruct you on the time to drink the  Pre-Surgery Ensure depending on your surgery time. o Finish the drink at the designated time by the pre-op nurse.  o Nothing else to drink after completing the  Pre-Surgery Clear Ensure.  . The day of surgery (if you have diabetes): o Drink ONE (1) Gatorade 2 (G2) as directed. o This drink was given to you during your hospital  pre-op appointment visit.  o The pre-op nurse will instruct you on the time to drink the   Gatorade 2 (G2) depending on your surgery time. o Color of the Gatorade may vary. Red is not allowed. o Nothing else to drink after completing the  Gatorade 2 (G2).         If you have questions, please contact your surgeon's office.   Surgical soap given to patient's spouse with instructions on how to use. Spouse states he understands.

## 2019-07-18 ENCOUNTER — Encounter (HOSPITAL_BASED_OUTPATIENT_CLINIC_OR_DEPARTMENT_OTHER)
Admission: RE | Disposition: A | Discharge status: Home / Self Care | Payer: Self-pay | Source: Home / Self Care | Attending: Surgery

## 2019-07-18 ENCOUNTER — Ambulatory Visit (HOSPITAL_BASED_OUTPATIENT_CLINIC_OR_DEPARTMENT_OTHER): Payer: Medicare Other | Admitting: Anesthesiology

## 2019-07-18 ENCOUNTER — Ambulatory Visit (HOSPITAL_COMMUNITY)
Admission: RE | Admit: 2019-07-18 | Discharge: 2019-07-18 | Disposition: A | Discharge status: Home / Self Care | Payer: Medicare Other | Attending: Surgery | Admitting: Surgery

## 2019-07-18 ENCOUNTER — Other Ambulatory Visit: Payer: Self-pay

## 2019-07-18 ENCOUNTER — Encounter (HOSPITAL_BASED_OUTPATIENT_CLINIC_OR_DEPARTMENT_OTHER): Payer: Self-pay | Admitting: Surgery

## 2019-07-18 DIAGNOSIS — Z87891 Personal history of nicotine dependence: Secondary | ICD-10-CM | POA: Insufficient documentation

## 2019-07-18 DIAGNOSIS — D0512 Intraductal carcinoma in situ of left breast: Secondary | ICD-10-CM | POA: Diagnosis not present

## 2019-07-18 DIAGNOSIS — E7800 Pure hypercholesterolemia, unspecified: Secondary | ICD-10-CM | POA: Insufficient documentation

## 2019-07-18 DIAGNOSIS — Z8249 Family history of ischemic heart disease and other diseases of the circulatory system: Secondary | ICD-10-CM | POA: Insufficient documentation

## 2019-07-18 DIAGNOSIS — Z888 Allergy status to other drugs, medicaments and biological substances status: Secondary | ICD-10-CM | POA: Diagnosis not present

## 2019-07-18 DIAGNOSIS — K219 Gastro-esophageal reflux disease without esophagitis: Secondary | ICD-10-CM | POA: Diagnosis not present

## 2019-07-18 DIAGNOSIS — I10 Essential (primary) hypertension: Secondary | ICD-10-CM | POA: Diagnosis not present

## 2019-07-18 DIAGNOSIS — E785 Hyperlipidemia, unspecified: Secondary | ICD-10-CM | POA: Diagnosis not present

## 2019-07-18 DIAGNOSIS — N6320 Unspecified lump in the left breast, unspecified quadrant: Secondary | ICD-10-CM | POA: Diagnosis not present

## 2019-07-18 DIAGNOSIS — E559 Vitamin D deficiency, unspecified: Secondary | ICD-10-CM | POA: Diagnosis not present

## 2019-07-18 HISTORY — PX: BREAST LUMPECTOMY: SHX2

## 2019-07-18 SURGERY — BREAST LUMPECTOMY
Anesthesia: General | Site: Breast | Laterality: Left

## 2019-07-18 MED ORDER — FENTANYL CITRATE (PF) 100 MCG/2ML IJ SOLN
50.0000 ug | INTRAMUSCULAR | Status: DC | PRN
  Administered 2019-07-18: 50 ug via INTRAVENOUS

## 2019-07-18 MED ORDER — ACETAMINOPHEN 500 MG PO TABS
1000.0000 mg | ORAL_TABLET | ORAL | Status: AC
  Administered 2019-07-18: 1000 mg via ORAL

## 2019-07-18 MED ORDER — GABAPENTIN 300 MG PO CAPS
ORAL_CAPSULE | ORAL | Status: AC
  Filled 2019-07-18: qty 1

## 2019-07-18 MED ORDER — CEFAZOLIN SODIUM-DEXTROSE 2-4 GM/100ML-% IV SOLN
INTRAVENOUS | Status: AC
  Filled 2019-07-18: qty 100

## 2019-07-18 MED ORDER — GABAPENTIN 300 MG PO CAPS
300.0000 mg | ORAL_CAPSULE | ORAL | Status: AC
  Administered 2019-07-18: 300 mg via ORAL

## 2019-07-18 MED ORDER — LIDOCAINE 2% (20 MG/ML) 5 ML SYRINGE
INTRAMUSCULAR | Status: AC
  Filled 2019-07-18: qty 5

## 2019-07-18 MED ORDER — ONDANSETRON HCL 4 MG/2ML IJ SOLN
INTRAMUSCULAR | Status: AC
  Filled 2019-07-18: qty 4

## 2019-07-18 MED ORDER — PROPOFOL 500 MG/50ML IV EMUL
INTRAVENOUS | Status: AC
  Filled 2019-07-18: qty 50

## 2019-07-18 MED ORDER — KETOROLAC TROMETHAMINE 30 MG/ML IJ SOLN
INTRAMUSCULAR | Status: DC | PRN
  Administered 2019-07-18: 15 mg via INTRAVENOUS

## 2019-07-18 MED ORDER — KETOROLAC TROMETHAMINE 30 MG/ML IJ SOLN
INTRAMUSCULAR | Status: AC
  Filled 2019-07-18: qty 1

## 2019-07-18 MED ORDER — BUPIVACAINE HCL (PF) 0.25 % IJ SOLN
INTRAMUSCULAR | Status: AC
  Filled 2019-07-18: qty 90

## 2019-07-18 MED ORDER — BUPIVACAINE HCL (PF) 0.25 % IJ SOLN
INTRAMUSCULAR | Status: DC | PRN
  Administered 2019-07-18: 20 mL

## 2019-07-18 MED ORDER — FENTANYL CITRATE (PF) 100 MCG/2ML IJ SOLN: 25.0000 ug | INTRAMUSCULAR | Status: DC | PRN

## 2019-07-18 MED ORDER — CHLORHEXIDINE GLUCONATE CLOTH 2 % EX PADS: 6.0000 | MEDICATED_PAD | Freq: Once | CUTANEOUS | Status: DC

## 2019-07-18 MED ORDER — LIDOCAINE 2% (20 MG/ML) 5 ML SYRINGE
INTRAMUSCULAR | Status: DC | PRN
  Administered 2019-07-18: 80 mg via INTRAVENOUS

## 2019-07-18 MED ORDER — MIDAZOLAM HCL 2 MG/2ML IJ SOLN
INTRAMUSCULAR | Status: AC
  Filled 2019-07-18: qty 2

## 2019-07-18 MED ORDER — CEFAZOLIN SODIUM-DEXTROSE 2-4 GM/100ML-% IV SOLN
2.0000 g | INTRAVENOUS | Status: AC
  Administered 2019-07-18: 2 g via INTRAVENOUS

## 2019-07-18 MED ORDER — FENTANYL CITRATE (PF) 100 MCG/2ML IJ SOLN
INTRAMUSCULAR | Status: AC
  Filled 2019-07-18: qty 2

## 2019-07-18 MED ORDER — MIDAZOLAM HCL 2 MG/2ML IJ SOLN: 1.0000 mg | INTRAMUSCULAR | Status: DC | PRN

## 2019-07-18 MED ORDER — ACETAMINOPHEN 500 MG PO TABS
ORAL_TABLET | ORAL | Status: AC
  Filled 2019-07-18: qty 2

## 2019-07-18 MED ORDER — EPHEDRINE SULFATE-NACL 50-0.9 MG/10ML-% IV SOSY
PREFILLED_SYRINGE | INTRAVENOUS | Status: DC | PRN
  Administered 2019-07-18 (×5): 10 mg via INTRAVENOUS

## 2019-07-18 MED ORDER — LACTATED RINGERS IV SOLN: INTRAVENOUS | Status: DC

## 2019-07-18 MED ORDER — ONDANSETRON HCL 4 MG/2ML IJ SOLN
INTRAMUSCULAR | Status: DC | PRN
  Administered 2019-07-18: 4 mg via INTRAVENOUS

## 2019-07-18 MED ORDER — PROPOFOL 10 MG/ML IV BOLUS
INTRAVENOUS | Status: DC | PRN
  Administered 2019-07-18: 160 mg via INTRAVENOUS

## 2019-07-18 SURGICAL SUPPLY — 33 items
BINDER BREAST XXLRG (GAUZE/BANDAGES/DRESSINGS) ×3 IMPLANT
BLADE SURG 15 STRL LF DISP TIS (BLADE) ×1 IMPLANT
BLADE SURG 15 STRL SS (BLADE) ×3
CHLORAPREP W/TINT 26 (MISCELLANEOUS) ×3 IMPLANT
COVER BACK TABLE 60X90IN (DRAPES) ×3 IMPLANT
COVER MAYO STAND STRL (DRAPES) ×3 IMPLANT
DERMABOND ADVANCED (GAUZE/BANDAGES/DRESSINGS) ×2
DERMABOND ADVANCED .7 DNX12 (GAUZE/BANDAGES/DRESSINGS) ×1 IMPLANT
DRAPE LAPAROTOMY 100X72 PEDS (DRAPES) ×3 IMPLANT
DRAPE UTILITY XL STRL (DRAPES) ×3 IMPLANT
ELECT REM PT RETURN 9FT ADLT (ELECTROSURGICAL) ×3
ELECTRODE REM PT RTRN 9FT ADLT (ELECTROSURGICAL) ×1 IMPLANT
GAUZE SPONGE 4X4 12PLY STRL LF (GAUZE/BANDAGES/DRESSINGS) ×3 IMPLANT
GLOVE BIO SURGEON STRL SZ 6.5 (GLOVE) ×2 IMPLANT
GLOVE BIO SURGEONS STRL SZ 6.5 (GLOVE) ×1
GLOVE SURG SIGNA 7.5 PF LTX (GLOVE) ×3 IMPLANT
GOWN STRL REUS W/ TWL LRG LVL3 (GOWN DISPOSABLE) ×1 IMPLANT
GOWN STRL REUS W/ TWL XL LVL3 (GOWN DISPOSABLE) ×1 IMPLANT
GOWN STRL REUS W/TWL LRG LVL3 (GOWN DISPOSABLE) ×3
GOWN STRL REUS W/TWL XL LVL3 (GOWN DISPOSABLE) ×3
KIT MARKER MARGIN INK (KITS) ×3 IMPLANT
NEEDLE HYPO 25X1 1.5 SAFETY (NEEDLE) ×3 IMPLANT
NS IRRIG 1000ML POUR BTL (IV SOLUTION) ×3 IMPLANT
PENCIL SMOKE EVACUATOR (MISCELLANEOUS) ×3 IMPLANT
SET BASIN DAY SURGERY F.S. (CUSTOM PROCEDURE TRAY) ×3 IMPLANT
SLEEVE SCD COMPRESS KNEE MED (MISCELLANEOUS) ×3 IMPLANT
SPONGE LAP 4X18 RFD (DISPOSABLE) ×3 IMPLANT
SUT MNCRL AB 4-0 PS2 18 (SUTURE) ×3 IMPLANT
SUT VIC AB 3-0 SH 27 (SUTURE) ×3
SUT VIC AB 3-0 SH 27X BRD (SUTURE) ×1 IMPLANT
SYR CONTROL 10ML LL (SYRINGE) ×3 IMPLANT
TOWEL GREEN STERILE FF (TOWEL DISPOSABLE) ×3 IMPLANT
TRAY FAXITRON CT DISP (TRAY / TRAY PROCEDURE) ×3 IMPLANT

## 2019-07-18 NOTE — Anesthesia Postprocedure Evaluation (Signed)
Anesthesia Post Note  Patient: Kathy Cook  Procedure(s) Performed: LEFT BREAST LUMPECTOMY (Left Breast)     Patient location during evaluation: PACU Anesthesia Type: General Level of consciousness: awake Pain management: pain level controlled Vital Signs Assessment: post-procedure vital signs reviewed and stable Respiratory status: spontaneous breathing Cardiovascular status: stable Postop Assessment: no apparent nausea or vomiting Anesthetic complications: no    Last Vitals:  Vitals:   07/18/19 0837 07/18/19 0851  BP:  136/60  Pulse: 67 68  Resp: 18 18  Temp:  36.6 C  SpO2: 98% 97%    Last Pain:  Vitals:   07/18/19 0851  TempSrc: Oral  PainSc: 2                  Jamekia Gannett

## 2019-07-18 NOTE — Anesthesia Procedure Notes (Signed)
Procedure Name: LMA Insertion Date/Time: 07/18/2019 7:29 AM Performed by: Gwyndolyn Saxon, CRNA Pre-anesthesia Checklist: Patient identified, Emergency Drugs available, Suction available and Patient being monitored Patient Re-evaluated:Patient Re-evaluated prior to induction Oxygen Delivery Method: Circle system utilized Preoxygenation: Pre-oxygenation with 100% oxygen Induction Type: IV induction Ventilation: Mask ventilation without difficulty LMA: LMA inserted LMA Size: 4.0 Number of attempts: 1 Placement Confirmation: positive ETCO2 and breath sounds checked- equal and bilateral Tube secured with: Tape Dental Injury: Teeth and Oropharynx as per pre-operative assessment

## 2019-07-18 NOTE — Anesthesia Preprocedure Evaluation (Signed)
Anesthesia Evaluation  Patient identified by MRN, date of birth, ID band Patient awake    Reviewed: Allergy & Precautions, NPO status , Patient's Chart, lab work & pertinent test results  Airway Mallampati: II  TM Distance: >3 FB     Dental   Pulmonary former smoker,    breath sounds clear to auscultation       Cardiovascular hypertension,  Rhythm:Regular Rate:Normal     Neuro/Psych    GI/Hepatic Neg liver ROS, GERD  ,  Endo/Other  negative endocrine ROS  Renal/GU negative Renal ROS     Musculoskeletal   Abdominal   Peds  Hematology  (+) anemia ,   Anesthesia Other Findings   Reproductive/Obstetrics                             Anesthesia Physical Anesthesia Plan  ASA: II  Anesthesia Plan: General   Post-op Pain Management:    Induction: Intravenous  PONV Risk Score and Plan: 3 and Ondansetron, Dexamethasone and Midazolam  Airway Management Planned: LMA  Additional Equipment:   Intra-op Plan:   Post-operative Plan: Extubation in OR  Informed Consent: I have reviewed the patients History and Physical, chart, labs and discussed the procedure including the risks, benefits and alternatives for the proposed anesthesia with the patient or authorized representative who has indicated his/her understanding and acceptance.     Dental advisory given  Plan Discussed with: CRNA and Anesthesiologist  Anesthesia Plan Comments:         Anesthesia Quick Evaluation

## 2019-07-18 NOTE — Discharge Instructions (Signed)
[facility] Office Phone Number [phone]  BREAST BIOPSY/ PARTIAL MASTECTOMY: POST OP INSTRUCTIONS  Always review your discharge instruction sheet given to you by the facility where your surgery was performed.  IF YOU HAVE DISABILITY OR FAMILY LEAVE FORMS, YOU MUST BRING THEM TO THE OFFICE FOR PROCESSING.  DO NOT GIVE THEM TO YOUR DOCTOR.  1. A prescription for pain medication may be given to you upon discharge.  Take your pain medication as prescribed, if needed.  If narcotic pain medicine is not needed, then you may take acetaminophen (Tylenol) or ibuprofen (Advil) as needed. 2. Take your usually prescribed medications unless otherwise directed 3. If you need a refill on your pain medication, please contact your pharmacy.  They will contact our office to request authorization.  Prescriptions will not be filled after 5pm or on week-ends. 4. You should eat very light the first 24 hours after surgery, such as soup, crackers, pudding, etc.  Resume your normal diet the day after surgery. 5. Most patients will experience some swelling and bruising in the breast.  Ice packs and a good support bra will help.  Swelling and bruising can take several days to resolve.  6. It is common to experience some constipation if taking pain medication after surgery.  Increasing fluid intake and taking a stool softener will usually help or prevent this problem from occurring.  A mild laxative (Milk of Magnesia or Miralax) should be taken according to package directions if there are no bowel movements after 48 hours. 7. Unless discharge instructions indicate otherwise, you may remove your bandages 24-48 hours after surgery, and you may shower at that time.  You may have steri-strips (small skin tapes) in place directly over the incision.  These strips should be left on the skin for 7-10 days.  If your surgeon used skin glue on the incision, you may shower in 24 hours.  The glue will flake off over the  next 2-3 weeks.  Any sutures or staples will be removed at the office during your follow-up visit. 8. ACTIVITIES:  You may resume regular daily activities (gradually increasing) beginning the next day.  Wearing a good support bra or sports bra minimizes pain and swelling.  You may have sexual intercourse when it is comfortable. a. You may drive when you no longer are taking prescription pain medication, you can comfortably wear a seatbelt, and you can safely maneuver your car and apply brakes. b. RETURN TO WORK:  ______________________________________________________________________________________ 9. You should see your doctor in the office for a follow-up appointment approximately two weeks after your surgery.  Your doctors nurse will typically make your follow-up appointment when she calls you with your pathology report.  Expect your pathology report 2-3 business days after your surgery.  You may call to check if you do not hear from [place] after three days. 10. OTHER INSTRUCTIONS:OK TO REMOVE THE BINDER AND SHOWER STARTING TOMORROW 11. ICE PACK, TYLENOL, AND IBUPROFEN ALSO FOR PAIN 12. NO VIGOROUS ACTIVITY FOR ONE WEEK 13. You may use the oxycodone you already have at home for pain _______________________________________________________________________________________________ _____________________________________________________________________________________________________________________________________ _____________________________________________________________________________________________________________________________________ _____________________________________________________________________________________________________________________________________  WHEN TO CALL YOUR DOCTOR: 1. Fever over 101.0 2. Nausea and/or vomiting. 3. Extreme swelling or bruising. 4. Continued bleeding from incision. 5. Increased pain, redness, or drainage from the incision.  The clinic staff is  available to answer your questions during regular business hours.  Please dont hesitate to call and ask to speak to one of the nurses for clinical concerns.  If you have a medical emergency, go to the nearest emergency room or call 911.  A surgeon from [facility] is always on call at the hospital.   [place] 1:00PM  NO ADVIL/MOTRIN UNTIL 4:00PM    Post Anesthesia Home Care Instructions  Activity: Get plenty of rest for the remainder of the day. A responsible individual must stay with you for 24 hours following the procedure.  For the next 24 hours, DO NOT: -Drive a car -Paediatric nurse -Drink alcoholic beverages -Take any medication unless instructed by your physician -Make any legal decisions or sign important papers.  Meals: Start with liquid foods such as gelatin or soup. Progress to regular foods as tolerated. Avoid greasy, spicy, heavy foods. If nausea and/or vomiting occur, drink only clear liquids until the nausea and/or vomiting subsides. Call your physician if vomiting continues.  Special Instructions/Symptoms: Your throat may feel dry or sore from the anesthesia or the breathing tube placed in your throat during surgery. If this causes discomfort, gargle with warm salt water. The discomfort should disappear within 24 hours.  If you had a scopolamine patch placed behind your ear for the management of post- operative nausea and/or vomiting:  1. The medication in the patch is effective for 72 hours, after which it should be removed.  Wrap patch in a tissue and discard in the trash. Wash hands thoroughly with soap and water. 2. You may remove the patch earlier than 72 hours if you experience unpleasant side effects which may include dry mouth, dizziness or visual disturbances. 3. Avoid touching the patch. Wash your hands with soap and water after contact with the patch.

## 2019-07-18 NOTE — Transfer of Care (Signed)
Immediate Anesthesia Transfer of Care Note  Patient: Kathy Cook  Procedure(s) Performed: LEFT BREAST LUMPECTOMY (Left Breast)  Patient Location: PACU  Anesthesia Type:General  Level of Consciousness: awake, alert  and oriented  Airway & Oxygen Therapy: Patient Spontanous Breathing and Patient connected to face mask oxygen  Post-op Assessment: Report given to RN and Post -op Vital signs reviewed and stable  Post vital signs: Reviewed and stable  Last Vitals:  Vitals Value Taken Time  BP 138/67 07/18/19 0804  Temp    Pulse 72 07/18/19 0808  Resp 11 07/18/19 0808  SpO2 100 % 07/18/19 0808  Vitals shown include unvalidated device data.  Last Pain:  Vitals:   07/18/19 0634  TempSrc: Oral  PainSc: 1       Patients Stated Pain Goal: 1 ([id number])  Complications: No apparent anesthesia complications

## 2019-07-18 NOTE — Interval H&P Note (Signed)
History and Physical Interval Note: no change in H and P  07/18/2019 7:07 AM  Kathy Cook  has presented today for surgery, with the diagnosis of left breast mass.  The various methods of treatment have been discussed with the patient and family. After consideration of risks, benefits and other options for treatment, the patient has consented to  Procedure(s) with comments: LEFT BREAST LUMPECTOMY (Left) - LMA as a surgical intervention.  The patient's history has been reviewed, patient examined, no change in status, stable for surgery.  I have reviewed the patient's chart and labs.  Questions were answered to the patient's satisfaction.     Coralie Keens

## 2019-07-18 NOTE — Op Note (Signed)
LEFT BREAST LUMPECTOMY  Procedure Note  [facility] TRIAL 07/18/2019   Pre-op Diagnosis: left breast mass     Post-op Diagnosis: same  Procedure(s): LEFT BREAST LUMPECTOMY  Surgeon(s): Coralie Keens, MD  Anesthesia: General  Staff:  Circulator: Izora Ribas, RN Scrub Person: Lorenza Burton, CST  Estimated Blood Loss: Minimal               Specimens: sent to path  Indications: This is a 74 year old female with a large, almost 5 cm left breast mass.  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  The decision was made to proceed to the operating for a lumpectomy  Procedure: The patient was brought to the operating room and identifies the correct patient.  She was placed upon the operating room table and general anesthesia was induced.  Her left breast was then prepped and draped in usual sterile fashion.  She had a hard, large, firm mass at the 12 to 12:30 position of the left breast.  It was not as mobile as expected therefore I made an incision over the top of the mass after anesthetizing skin with Marcaine.  I then dissected down to the mass.  I then excised the mass circumferentially going down to the chest wall with the electrocautery.  Once the large mass was removed, I marked the margins with paint and then sent it to pathology for evaluation.  Hemostasis was then achieved with the cautery.  I anesthetized the incision further with Marcaine.  I then closed the subcutaneous tissue with interrupted 3-0 Vicryl sutures and closed the skin with a running 4-0 Monocryl.  Dermabond was then applied.  The patient tolerated procedure well.  She is placed in a breast binder.  All counts were correct at the end of the procedure.  She was then taken in a stable condition to the recovery room.          Coralie Keens   Date: 07/18/2019  Time: 7:59 AM

## 2019-07-22 ENCOUNTER — Encounter: Payer: Self-pay | Admitting: *Deleted

## 2019-08-02 ENCOUNTER — Telehealth: Payer: Self-pay | Admitting: Oncology

## 2019-08-02 NOTE — Telephone Encounter (Signed)
Received a new pt referral from Dr. Ninfa Linden for a dx of breast cancer. Ms. Kathy Cook has been cld and scheduled to see Dr. Jana Hakim on 6/9 at 4pm w/ labs at 330pm. Pt aware to arrive 15 minutes early.

## 2019-08-06 ENCOUNTER — Other Ambulatory Visit: Payer: Self-pay | Admitting: *Deleted

## 2019-08-06 ENCOUNTER — Other Ambulatory Visit: Payer: Self-pay | Admitting: Oncology

## 2019-08-06 DIAGNOSIS — M8580 Other specified disorders of bone density and structure, unspecified site: Secondary | ICD-10-CM

## 2019-08-06 DIAGNOSIS — D0512 Intraductal carcinoma in situ of left breast: Secondary | ICD-10-CM | POA: Insufficient documentation

## 2019-08-07 ENCOUNTER — Other Ambulatory Visit: Payer: Self-pay

## 2019-08-07 ENCOUNTER — Inpatient Hospital Stay: Payer: Medicare Other

## 2019-08-07 ENCOUNTER — Inpatient Hospital Stay: Payer: Medicare Other | Attending: Oncology | Admitting: Oncology

## 2019-08-07 DIAGNOSIS — Z170 Estrogen receptor positive status [ER+]: Secondary | ICD-10-CM | POA: Diagnosis not present

## 2019-08-07 DIAGNOSIS — D4860 Neoplasm of uncertain behavior of unspecified breast: Secondary | ICD-10-CM | POA: Diagnosis not present

## 2019-08-07 DIAGNOSIS — C50912 Malignant neoplasm of unspecified site of left female breast: Secondary | ICD-10-CM | POA: Diagnosis not present

## 2019-08-07 DIAGNOSIS — Z87891 Personal history of nicotine dependence: Secondary | ICD-10-CM | POA: Diagnosis not present

## 2019-08-07 DIAGNOSIS — Z85828 Personal history of other malignant neoplasm of skin: Secondary | ICD-10-CM | POA: Insufficient documentation

## 2019-08-07 DIAGNOSIS — M8580 Other specified disorders of bone density and structure, unspecified site: Secondary | ICD-10-CM

## 2019-08-07 LAB — CMP (CANCER CENTER ONLY)
ALT: 18 U/L (ref 0–44)
AST: 17 U/L (ref 15–41)
Albumin: 3.9 g/dL (ref 3.5–5.0)
Alkaline Phosphatase: 78 U/L (ref 38–126)
Anion gap: 13 (ref 5–15)
BUN: 20 mg/dL (ref 8–23)
CO2: 26 mmol/L (ref 22–32)
Calcium: 9.8 mg/dL (ref 8.9–10.3)
Chloride: 106 mmol/L (ref 98–111)
Creatinine: 1.11 mg/dL — ABNORMAL HIGH (ref 0.44–1.00)
GFR, Est AFR Am: 58 mL/min — ABNORMAL LOW (ref >60)
GFR, Estimated: 50 mL/min — ABNORMAL LOW (ref >60)
Glucose, Bld: 111 mg/dL — ABNORMAL HIGH (ref 70–99)
Potassium: 4.9 mmol/L (ref 3.5–5.1)
Sodium: 145 mmol/L (ref 135–145)
Total Bilirubin: 0.4 mg/dL (ref 0.3–1.2)
Total Protein: 7 g/dL (ref 6.5–8.1)

## 2019-08-07 LAB — CBC WITH DIFFERENTIAL (CANCER CENTER ONLY)
Abs Immature Granulocytes: 0.03 10*3/uL (ref 0.00–0.07)
Basophils Absolute: 0 10*3/uL (ref 0.0–0.1)
Basophils Relative: 1 %
Eosinophils Absolute: 0.9 10*3/uL — ABNORMAL HIGH (ref 0.0–0.5)
Eosinophils Relative: 12 %
HCT: 42.1 % (ref 36.0–46.0)
Hemoglobin: 13.6 g/dL (ref 12.0–15.0)
Immature Granulocytes: 0 %
Lymphocytes Relative: 22 %
Lymphs Abs: 1.6 10*3/uL (ref 0.7–4.0)
MCH: 31.8 pg (ref 26.0–34.0)
MCHC: 32.3 g/dL (ref 30.0–36.0)
MCV: 98.4 fL (ref 80.0–100.0)
Monocytes Absolute: 0.7 10*3/uL (ref 0.1–1.0)
Monocytes Relative: 9 %
Neutro Abs: 4.3 10*3/uL (ref 1.7–7.7)
Neutrophils Relative %: 56 %
Platelet Count: 219 10*3/uL (ref 150–400)
RBC: 4.28 MIL/uL (ref 3.87–5.11)
RDW: 12.8 % (ref 11.5–15.5)
WBC Count: 7.6 10*3/uL (ref 4.0–10.5)
nRBC: 0 % (ref 0.0–0.2)

## 2019-08-07 NOTE — Progress Notes (Signed)
[place]  [phone] [phone]     ID: Kathy Cook DOB: [birth year]  MR#: [id number]  GHW#:[id number]  Patient Care Team: Unk Pinto, MD as PCP - General (Internal Medicine) Dian Queen, MD as Consulting Physician (Obstetrics and Gynecology) Clent Jacks, MD as Consulting Physician (Ophthalmology) Lindwood Coke, MD as Consulting Physician (Dermatology) Aviyah Swetz, Virgie Dad, MD as Consulting Physician (Oncology) Coralie Keens, MD as Consulting Physician (General Surgery) Eppie Gibson, MD as Attending Physician (Radiation Oncology) [name], MD as Consulting Physician (Orthopedic Surgery) Chauncey Cruel, MD OTHER MD:  CHIEF COMPLAINT: estrogen receptor positive breast cancer  CURRENT TREATMENT: Awaiting additional path results   HISTORY OF CURRENT ILLNESS: Kathy Cook herself palpated an upper-central left breast mass. She underwent left diagnostic mammography with tomography and left breast ultrasonography at [facility] on 05/24/2019 showing: breast density category C; 4.7 cm palpable suspicious left breast mass at 12:30; negative for lymphadenopathy.  Accordingly on 05/28/2019 she proceeded to biopsy of the left breast area in question. The pathology from this procedure ([id number]) showed: fibroadenoma.  This was felt to be discordant and excision was recommended.  She opted to proceed with left lumpectomy on 07/18/2019 under Dr. Ninfa Linden. Pathology [id number]) revealed: ductal carcinoma in situ, low-grade, arising in a fibroepithelial lesion, 3.9 cm; margins were clear for the ductal carcinoma in situ but positive for the fibroepithelial lesion; prognostic indicators for the ductal carcinoma in situ significant for: estrogen receptor, 95% positive and progesterone receptor, 95% positive, both with strong to moderate staining intensity.   The patient's subsequent history is as detailed below.   INTERVAL  HISTORY: Kathy Cook was evaluated in the breast cancer clinic on 08/07/2019. Her case was also presented at the multidisciplinary breast cancer conference on the same day. At that time a preliminary plan was proposed: Additional surgery to clear margins as needed depending on the pathology review, which is expected to show a phyllodes tumor.  Adjuvant radiation after breast conservation then antiestrogens.   REVIEW OF SYSTEMS: The patient denies unusual headaches, visual changes, nausea, vomiting, stiff neck, dizziness, or gait imbalance. There has been no cough, phlegm production, or pleurisy, no chest pain or pressure, and no change in bowel or bladder habits. The patient denies fever, rash, bleeding, unexplained fatigue or unexplained weight loss.  She had right knee surgery and is still having problems with pain and not being able to walk as much as she would like.  A detailed review of systems was otherwise entirely negative.   PAST MEDICAL HISTORY: Past Medical History:  Diagnosis Date  . Allergy   . Anxiety   . Attention deficit disorder (ADD)   . Cancer [facility])    ? basal/squam cell on chest, and forehead  . Depression   . Dry eyes   . GERD (gastroesophageal reflux disease)   . HSV infection   . Lumbar degenerative disc disease   . Ocular rosacea   . Osteopenia     PAST SURGICAL HISTORY: Past Surgical History:  Procedure Laterality Date  . BREAST LUMPECTOMY Left 07/18/2019   Procedure: LEFT BREAST LUMPECTOMY;  Surgeon: Coralie Keens, MD;  Location: [place];  Service: General;  Laterality: Left;  LMA  . SKIN BIOPSY     ?basal/squam cell on chest  . TONSILLECTOMY AND ADENOIDECTOMY    . TOTAL KNEE ARTHROPLASTY Right 03/04/2019   Procedure: RIGHT TOTAL KNEE ARTHROPLASTY;  Surgeon: [name], MD;  Location: [facility];  Service: Orthopedics;  Laterality: Right;    FAMILY HISTORY: Family History  Problem Relation Age of Onset  . Diabetes Father   . Hypertension  Mother   . Depression Mother   . Macular degeneration Mother   . Heart disease Mother   The patient's father died at age 55 following a fall.  The patient's mother died at age 46 with heart disease problems.  The patient has 2 brothers no sisters.  1 brother has had his thyroid removed and may have had thyroid carcinoma; the second brother is a Teacher, music in [place].  There is no cancer in the family to the patient's knowledge.   GYNECOLOGIC HISTORY:  No LMP recorded. Patient is postmenopausal. Menarche: 74 years old Age at first live birth: 74 years old [place] P 2 LMP 29 Hormone replacement; yes, more than 20 years Hysterectomy: no BSO? 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  Her husband of more than 50 years Kathy Cook ("[name]") works in finances.  Son Kathy Cook runs a business in [place].  Daughter Kathy Cook is an attorney planning to move to [place] soon.  The patient has 2 grandchildren.  She is a member of [employer] [place]: In the absence of any documents to the contrary the patient's husband is her healthcare power of attorney   HEALTH MAINTENANCE: Social History   Tobacco Use  . Smoking status: Former Smoker    Packs/day: 0.50    Years: 15.00    Pack years: 7.50    Types: Cigarettes    Quit date: 03/23/1980    Years since quitting: 39.4  . Smokeless tobacco: Never Used  Substance Use Topics  . Alcohol use: Yes    Alcohol/week: 0.0 standard drinks    Comment: social  . Drug use: No     Colonoscopy: 06/2017 (Dr. Mayo Ao further colonoscopies planned  PAP: 2020 (Dr. Helane Rima)  Bone density: 2019 (at [facility])   Allergies  Allergen Reactions  . Prednisone Nausea And Vomiting  . Statins     paralyzing  . Strattera [Atomoxetine Hcl]     Increased sadness    Current Outpatient Medications  Medication Sig Dispense Refill  . amphetamine-dextroamphetamine  (ADDERALL) 30 MG tablet Take 1/2 to 1 tablet 2 x /day as needed for Focus & Concentration (Patient taking differently: Take 15-30 mg by mouth See admin instructions. Take 1/2 to 1 tablet 2 x /day as needed for Focus & Concentration) 60 tablet 0  . Colesevelam HCl 3.75 g PACK Dissolve 1 packet in water or juice & Drink Daily 90 each 3  . Estradiol-Estriol-Progesterone (BIEST/PROGESTERONE) CREA Place onto the skin. Marland Kitchen05 mg/ml cream to apply daily    . Progesterone Micronized 10 % CREA Place onto the skin. Patient applies 2 % cream daily     No current facility-administered medications for this visit.    OBJECTIVE: White woman who appears younger than stated age  56:   08/07/19 1556  BP: (!) 147/68  Pulse: 68  Resp: 18  Temp: 98.2 F (36.8 C)  SpO2: 98%     Body mass index is 35.49 kg/m.   Wt Readings from Last 3 Encounters:  08/07/19 197 lb 3.2 oz (89.4 kg)  07/18/19 198 lb 6.6 oz (90 kg)  05/06/19 190 lb (86.2 kg)      ECOG FS:1 - Symptomatic but completely ambulatory  Ocular: Sclerae unicteric, pupils round and equal Ear-nose-throat: Wearing a mask Lymphatic: No  cervical or supraclavicular adenopathy Lungs no rales or rhonchi Heart regular rate and rhythm Abd soft, nontender, positive bowel sounds MSK no focal spinal tenderness Neuro: non-focal, well-oriented, appropriate affect Breasts: The right breast is unremarkable.  The left breast is status post recent lumpectomy.  The incision is healing nicely without dehiscence and with moderate subjacent induration.  Both axillae are benign   LAB RESULTS:  CMP     Component Value Date/Time   NA 145 08/07/2019 1518   K 4.9 08/07/2019 1518   CL 106 08/07/2019 1518   CO2 26 08/07/2019 1518   GLUCOSE 111 (H) 08/07/2019 1518   BUN 20 08/07/2019 1518   CREATININE 1.11 (H) 08/07/2019 1518   CREATININE 0.82 05/06/2019 1455   CALCIUM 9.8 08/07/2019 1518   PROT 7.0 08/07/2019 1518   ALBUMIN 3.9 08/07/2019 1518   AST 17  08/07/2019 1518   ALT 18 08/07/2019 1518   ALKPHOS 78 08/07/2019 1518   BILITOT 0.4 08/07/2019 1518   GFRNONAA 50 (L) 08/07/2019 1518   GFRNONAA 72 05/06/2019 1455   GFRAA 58 (L) 08/07/2019 1518   GFRAA 83 05/06/2019 1455    No results found for: TOTALPROTELP, ALBUMINELP, A1GS, A2GS, BETS, BETA2SER, GAMS, MSPIKE, SPEI  Lab Results  Component Value Date   WBC 7.6 08/07/2019   NEUTROABS 4.3 08/07/2019   HGB 13.6 08/07/2019   HCT 42.1 08/07/2019   MCV 98.4 08/07/2019   PLT 219 08/07/2019    No results found for: LABCA2  No components found for: JTTSVX793  No results for input(s): INR in the last 168 hours.  No results found for: LABCA2  No results found for: JQZ009  No results found for: QZR007  No results found for: MAU633  No results found for: CA2729  No components found for: HGQUANT  No results found for: CEA1 / No results found for: CEA1   No results found for: AFPTUMOR  No results found for: CHROMOGRNA  No results found for: KPAFRELGTCHN, LAMBDASER, KAPLAMBRATIO (kappa/lambda light chains)  No results found for: HGBA, HGBA2QUANT, HGBFQUANT, HGBSQUAN (Hemoglobinopathy evaluation)   No results found for: LDH  Lab Results  Component Value Date   IRON 114 04/05/2017   TIBC 332 04/05/2017   IRONPCTSAT 34 04/05/2017   (Iron and TIBC)  Lab Results  Component Value Date   FERRITIN 166 04/04/2016    Urinalysis    Component Value Date/Time   COLORURINE YELLOW 02/20/2019 1332   APPEARANCEUR HAZY (A) 02/20/2019 1332   LABSPEC >1.030 (H) 02/20/2019 1332   PHURINE 5.5 02/20/2019 1332   GLUCOSEU NEGATIVE 02/20/2019 1332   HGBUR NEGATIVE 02/20/2019 1332   BILIRUBINUR NEGATIVE 02/20/2019 1332   KETONESUR NEGATIVE 02/20/2019 1332   PROTEINUR NEGATIVE 02/20/2019 1332   UROBILINOGEN 0.2 12/31/2012 1151   NITRITE NEGATIVE 02/20/2019 1332   LEUKOCYTESUR NEGATIVE 02/20/2019 1332    STUDIES: No results found.   ELIGIBLE FOR AVAILABLE RESEARCH  PROTOCOL: AET  ASSESSMENT: 74 y.o. [place] woman status post left lumpectomy 07/26/2019 for ductal carcinoma in situ, arising in a fibroepithelial lesion which may be a phyllodes tumor.  (a) the ductal carcinoma in situ is low-grade, does not involve the margins, and is strongly estrogen and progesterone receptor positive  (b) margins are broadly positive for the fibroepithelial lesion  (1) adjuvant radiation  (2) antiestrogens  (3) additional surgery for margin clearance  PLAN: I met today with Kathy Cook to review her new diagnosis. Specifically we discussed the biology of her breast cancer, its diagnosis, staging, treatment  options and prognosis. We first reviewed the fact that cancer is not one disease but more than 100 different diseases and that it is important to keep them separate-- otherwise when friends and relatives discuss their own cancer experiences with Kathy Cook confusion can result. Similarly we explained that if breast cancer spreads to the bone or liver, the patient would not have bone cancer or liver cancer, but breast cancer in the bone and breast cancer in the liver: one cancer in three places-- not 3 different cancers which otherwise would have to be treated in 3 different ways.  We also discussed the fact that not everything in the breast is breast.  The breast is only the milk glands (lobules), the milk tubes (ducts), and the nipple complex.  The rest is supportive tissue: Nerves, blood vessels, muscle, fat, skin.  If the cancer arises from one of the supported vision tissues it is not considered breast cancer, which is a carcinoma.  These other cancers are called sarcomas.  Starting with the breast carcinoma, Kathy Cook understands that in noninvasive ductal carcinoma, also called ductal carcinoma in situ ("DCIS") the breast cancer cells remain trapped in the ducts were they started. They cannot travel to a vital organ. For that reason these cancers in themselves are not  life-threatening.  If the whole breast is removed then all the ducts are removed and since the cancer cells are trapped in the ducts, the cure rate with mastectomy for noninvasive breast cancer is approximately 99%. Nevertheless we recommend lumpectomy, because there is no survival advantage to mastectomy and because the cosmetic result is generally superior with breast conservation.  Since the patient opted to keep her breast, there will be some risk of recurrence. The recurrence can only be in the same breast since, again, the cells are trapped in the ducts. There is no connection from one breast to the other. The risk of local recurrence is cut by more than half with radiation, which is standard in this situation.  In estrogen receptor positive cancers like Kathy Cook, anti-estrogens can also be considered. They will further reduce the risk of recurrence by one half. In addition anti-estrogens will lower the risk of a new breast cancer developing in either breast, also by one half. That risk otherwise approaches 1% per year.   Accordingly as far as her ductal carcinoma in situ is concerned, she started with surgery and this will be followed by radiation, then a discussion of anti-estrogens.  We then turned to the fact that there is a fibroepithelial lesion in the breast which is not breast cancer and which may not be cancer.  There are tumors called phyllodes tumors of the breast and that may be what we are dealing with here.  Phyllodes tumors come in different grades, and there are some differences in treatment depending on that, but all of them require surgery with broad margins Kathy Cook is unfortunately going to need additional surgery.  At the discussion today Dr. Ninfa Linden felt that he could do a second lumpectomy instead of having to proceed to mastectomy.  Once we have the final pathology he will be calling polyp to set up a meeting and discuss this further.  She wanted to know what caused her tumor  and of course we do not know.  We do know that hormone replacement increases the risk of breast cancer and I have suggested she discontinue that at this point.  She will return to see me in about 2 months.  I would  think she will be getting done with radiation by that time.  We can then decide if she also wants to do antiestrogens after radiation.  We may be able also to help her with some of the postmenopausal symptoms she may be experiencing by that time  Kathy Cook has a good understanding of the overall plan. She agrees with it. She knows the goal of treatment in her case is cure. She will call with any problems that may develop before her next visit here.  Total encounter time 65 minutes.Sarajane Jews C. Gabbi Whetstone, MD 08/07/2019 5:18 PM Medical Oncology and Hematology [facility] [place], [place] [address] Tel. [phone]    Fax. [phone]   This document serves as a record of services personally performed by Lurline Del, MD. It was created on his behalf by Wilburn Mylar, a trained medical scribe. The creation of this record is based on the scribe's personal observations and the provider's statements to them.   I, Lurline Del MD, have reviewed the above documentation for accuracy and completeness, and I agree with the above.    *Total Encounter Time as defined by the Centers for Medicare and Medicaid Services includes, in addition to the face-to-face time of a patient visit (documented in the note above) non-face-to-face time: obtaining and reviewing outside history, ordering and reviewing medications, tests or procedures, care coordination (communications with other health care professionals or caregivers) and documentation in the medical record.

## 2019-08-08 ENCOUNTER — Telehealth: Payer: Self-pay | Admitting: Oncology

## 2019-08-08 NOTE — Telephone Encounter (Signed)
Scheduled appts per 6/9 los. Pt confirmed appt date and time.  °

## 2019-08-12 ENCOUNTER — Encounter: Payer: Self-pay | Admitting: *Deleted

## 2019-08-15 ENCOUNTER — Encounter (HOSPITAL_COMMUNITY): Payer: Self-pay

## 2019-08-15 LAB — SURGICAL PATHOLOGY

## 2019-08-15 NOTE — Progress Notes (Signed)
History of Present Illness:      This very nice 74 y.o. MWF presents for 3 month follow up with HTN, HLD, Pre-Diabetes and Vitamin D Deficiency.       Patient is currently undergoing evaluation of a Left Breast lumpectomy (07/18/2019)  with pathology suspect for ductal Carcinoma in situ which is low-grade &does not involve the margins. She's been seen by the multidisciplinary breast cancer conference  & Dr Jana Hakim recommends adjuvant radiation followed by anti-estrogen therapy.       Patient is monitored expectantly for labile  HTN & BP has been controlled at home. Today's BP is at goal -  108/80. Patient has had no complaints of any cardiac type chest pain, palpitations, dyspnea / orthopnea / PND, dizziness, claudication, or dependent edema.      Hyperlipidemia is controlled with diet & meds. Patient has hx/o statin intolerance with "severe" myalgias. Patient denies myalgias or other med SE's. Last Lipids were not at goal:  Lab Results  Component Value Date   CHOL 292 (H) 05/06/2019   HDL 87 05/06/2019   LDLCALC 144 (H) 05/06/2019   TRIG 396 (H) 05/06/2019   CHOLHDL 3.4 05/06/2019    Also, the patient has history of moderate obesity  (BMI 34.7+) and is monitored expectantly for  PreDiabetes and has had no symptoms of reactive hypoglycemia, diabetic polys, paresthesias or visual blurring.  Last A1c was   Lab Results  Component Value Date   HGBA1C 5.4 05/06/2019           Further, the patient also has history of Vitamin D Deficiency ("40" / 2010) and supplements vitamin D without any suspected side-effects. Last vitamin D was  Lab Results  Component Value Date   VD25OH 55 05/06/2019    Current Outpatient Medications on File Prior to Visit  Medication Sig  . amphetamine-dextroamphetamine (ADDERALL) 30 MG tablet Take 1/2 to 1 tablet 2 x /day as needed for Focus & Concentration (Patient taking differently: Take 15-30 mg by mouth See admin instructions. Take 1/2 to 1 tablet 2  x /day as needed for Focus & Concentration)  . Colesevelam HCl 3.75 g PACK Dissolve 1 packet in water or juice & Drink Daily  . VITAMIN D PO Take 10,000 Units by mouth daily.  . Estradiol-Estriol-Progesterone (BIEST/PROGESTERONE) CREA Place onto the skin. Marland Kitchen05 mg/ml cream to apply daily (Patient not taking: Reported on 08/16/2019)  . Progesterone Micronized 10 % CREA Place onto the skin. Patient applies 2 % cream daily (Patient not taking: Reported on 08/16/2019)   No current facility-administered medications on file prior to visit.    Allergies  Allergen Reactions  . Prednisone Nausea And Vomiting  . Statins     paralyzing  . Strattera [Atomoxetine Hcl]     Increased sadness    PMHx:   Past Medical History:  Diagnosis Date  . Allergy   . Anxiety   . Attention deficit disorder (ADD)   . Cancer [facility])    ? basal/squam cell on chest, and forehead  . Depression   . Dry eyes   . GERD (gastroesophageal reflux disease)   . HSV infection   . Lumbar degenerative disc disease   . Ocular rosacea   . Osteopenia     Immunization History  Administered Date(s) Administered  . DT (Pediatric) 01/14/2015  . Influenza, High Dose Seasonal PF 04/30/2018, 11/13/2018  . PFIZER SARS-COV-2 Vaccination 04/07/2019, 05/01/2019  . Pneumococcal Conjugate-13 03/24/2015  . Pneumococcal-Unspecified 12/31/2012  .  Td 03/29/2004    Past Surgical History:  Procedure Laterality Date  . BREAST LUMPECTOMY Left 07/18/2019   Procedure: LEFT BREAST LUMPECTOMY;  Surgeon: Coralie Keens, MD;  Location: [place] [facility];  Service: General;  Laterality: Left;  LMA  . SKIN BIOPSY     ?basal/squam cell on chest  . TONSILLECTOMY AND ADENOIDECTOMY    . TOTAL KNEE ARTHROPLASTY Right 03/04/2019   Procedure: RIGHT TOTAL KNEE ARTHROPLASTY;  Surgeon: [name], MD;  Location: [place];  Service: Orthopedics;  Laterality: Right;    FHx:    Reviewed / unchanged  SHx:    Reviewed / unchanged   Systems  Review:  Constitutional: Denies fever, chills, wt changes, headaches, insomnia, fatigue, night sweats, change in appetite. Eyes: Denies redness, blurred vision, diplopia, discharge, itchy, watery eyes.  ENT: Denies discharge, congestion, post nasal drip, epistaxis, sore throat, earache, hearing loss, dental pain, tinnitus, vertigo, sinus pain, snoring.  CV: Denies chest pain, palpitations, irregular heartbeat, syncope, dyspnea, diaphoresis, orthopnea, PND, claudication or edema. Respiratory: denies cough, dyspnea, DOE, pleurisy, hoarseness, laryngitis, wheezing.  Gastrointestinal: Denies dysphagia, odynophagia, heartburn, reflux, water brash, abdominal pain or cramps, nausea, vomiting, bloating, diarrhea, constipation, hematemesis, melena, hematochezia  or hemorrhoids. Genitourinary: Denies dysuria, frequency, urgency, nocturia, hesitancy, discharge, hematuria or flank pain. Musculoskeletal: Denies arthralgias, myalgias, stiffness, jt. swelling, pain, limping or strain/sprain.  Skin: Denies pruritus, rash, hives, warts, acne, eczema or change in skin lesion(s). Neuro: No weakness, tremor, incoordination, spasms, paresthesia or pain. Psychiatric: Denies confusion, memory loss or sensory loss. Endo: Denies change in weight, skin or hair change.  Heme/Lymph: No excessive bleeding, bruising or enlarged lymph nodes.  Physical Exam  BP 108/80   Pulse 64   Temp 97.6 F (36.4 C)   Resp 16   Ht 5' 2.25" (1.581 m)   Wt 195 lb 6.4 oz (88.6 kg)   BMI 35.45 kg/m   Appears  well nourished, well groomed  and in no distress.  Eyes: PERRLA, EOMs, conjunctiva no swelling or erythema. Sinuses: No frontal/maxillary tenderness ENT/Mouth: EAC's clear, TM's nl w/o erythema, bulging. Nares clear w/o erythema, swelling, exudates. Oropharynx clear without erythema or exudates. Oral hygiene is good. Tongue normal, non obstructing. Hearing intact.  Neck: Supple. Thyroid not palpable. Car 2+/2+ without bruits,  nodes or JVD. Chest: Respirations nl with BS clear & equal w/o rales, rhonchi, wheezing or stridor.  Cor: Heart sounds normal w/ regular rate and rhythm without sig. murmurs, gallops, clicks or rubs. Peripheral pulses normal and equal  without edema.  Abdomen: Soft & bowel sounds normal. Non-tender w/o guarding, rebound, hernias, masses or organomegaly.  Lymphatics: Unremarkable.  Musculoskeletal: Full ROM all peripheral extremities, joint stability, 5/5 strength and normal gait.  Skin: Warm, dry without exposed rashes, lesions or ecchymosis apparent.  Neuro: Cranial nerves intact, reflexes equal bilaterally. Sensory-motor testing grossly intact. Tendon reflexes grossly intact.  Pysch: Alert & oriented x 3.  Insight and judgement nl & appropriate. No ideations.  Assessment and Plan:  1. Labile hypertension  - Continue medication, monitor blood pressure at home.  - Continue DASH diet.  Reminder to go to the ER if any CP,  SOB, nausea, dizziness, severe HA, changes vision/speech.  - Magnesium - TSH  2. Hyperlipidemia, mixed  - Continue diet/meds, exercise,& lifestyle modifications.  - Continue monitor periodic cholesterol/liver & renal functions   - Lipid panel - TSH  3. Abnormal glucose  - Continue diet, exercise  - Lifestyle modifications.  - Monitor appropriate labs.  -  Hemoglobin A1c - Insulin, random  4. Vitamin D deficiency  - Continue supplementation.  - VITAMIN D 25 Hydroxy  5. Medication management  - Magnesium - Lipid panel - TSH - Hemoglobin A1c - Insulin, random - VITAMIN D 25 Hydroxy         Discussed  regular exercise, BP monitoring, weight control to achieve/maintain BMI less than 25 and discussed med and SE's. Recommended labs to assess and monitor clinical status with further disposition pending results of labs.  I discussed the assessment and treatment plan with the patient. The patient was provided an opportunity to ask questions and all were  answered. The patient agreed with the plan and demonstrated an understanding of the instructions.  I provided over 55 minutes of exam, counseling, chart review and  complex critical decision making.   Kirtland Bouchard, MD

## 2019-08-16 ENCOUNTER — Other Ambulatory Visit: Payer: Self-pay

## 2019-08-16 ENCOUNTER — Ambulatory Visit (INDEPENDENT_AMBULATORY_CARE_PROVIDER_SITE_OTHER): Payer: Medicare Other | Admitting: Internal Medicine

## 2019-08-16 VITALS — BP 108/80 | HR 64 | Temp 97.6°F | Resp 16 | Ht 62.25 in | Wt 195.4 lb

## 2019-08-16 DIAGNOSIS — E559 Vitamin D deficiency, unspecified: Secondary | ICD-10-CM | POA: Diagnosis not present

## 2019-08-16 DIAGNOSIS — R0989 Other specified symptoms and signs involving the circulatory and respiratory systems: Secondary | ICD-10-CM | POA: Diagnosis not present

## 2019-08-16 DIAGNOSIS — Z79899 Other long term (current) drug therapy: Secondary | ICD-10-CM

## 2019-08-16 DIAGNOSIS — R7309 Other abnormal glucose: Secondary | ICD-10-CM

## 2019-08-16 DIAGNOSIS — E782 Mixed hyperlipidemia: Secondary | ICD-10-CM

## 2019-08-16 NOTE — Patient Instructions (Signed)

## 2019-08-17 ENCOUNTER — Other Ambulatory Visit: Payer: Self-pay | Admitting: Internal Medicine

## 2019-08-17 ENCOUNTER — Encounter: Payer: Self-pay | Admitting: Internal Medicine

## 2019-08-17 MED ORDER — NEXLETOL 180 MG PO TABS: ORAL_TABLET | ORAL | 1 refills | Status: DC

## 2019-08-19 LAB — LIPID PANEL
Cholesterol: 329 mg/dL — ABNORMAL HIGH (ref <200)
HDL: 88 mg/dL (ref >=50)
LDL Cholesterol (Calc): 205 mg/dL (calc) — ABNORMAL HIGH
Non-HDL Cholesterol (Calc): 241 mg/dL (calc) — ABNORMAL HIGH (ref <130)
Total CHOL/HDL Ratio: 3.7 (calc) (ref <5.0)
Triglycerides: 185 mg/dL — ABNORMAL HIGH (ref <150)

## 2019-08-19 LAB — TSH: TSH: 2.22 mIU/L (ref 0.40–4.50)

## 2019-08-19 LAB — HEMOGLOBIN A1C
Hgb A1c MFr Bld: 5.5 % of total Hgb (ref <5.7)
Mean Plasma Glucose: 111 (calc)
eAG (mmol/L): 6.2 (calc)

## 2019-08-19 LAB — VITAMIN D 25 HYDROXY (VIT D DEFICIENCY, FRACTURES): Vit D, 25-Hydroxy: 51 ng/mL (ref 30–100)

## 2019-08-19 LAB — MAGNESIUM: Magnesium: 2.3 mg/dL (ref 1.5–2.5)

## 2019-08-19 LAB — INSULIN, RANDOM: Insulin: 8.3 u[IU]/mL

## 2019-08-26 ENCOUNTER — Other Ambulatory Visit: Payer: Self-pay | Admitting: Surgery

## 2019-08-26 NOTE — Progress Notes (Signed)
Location of Breast Cancer: Ductal carcinoma in situ (DCIS) of LEFT breast  Histology per Pathology Report:  07/18/2019 FINAL MICROSCOPIC DIAGNOSIS:  A. BREAST, LEFT, LUMPECTOMY:  - Combined glandular and stromal lesion, most consistent with phyllodes  tumor, 3.9 cm with foci of ductal carcinoma in situ, intermediate  nuclear grade, solid, cribriform and pagetoid patterns  - See oncology table and consultant comment below  COMMENT:  Within the fibroepithelial lesion, there is ductal carcinoma in situ.  This diagnosis is supported by the presence of myoepithelial cells (SMM,  p63 and calponin) in the loss of cytokeratin 5/6 expression and  epithelial proliferation. The fibroepithelial lesion extends to the  inked resection margins. However, the ductal carcinoma in situ does not  involve the margins.   Receptor Status: ER(95%), PR (95%)  Did patient present with symptoms (if so, please note symptoms) or was this found on screening mammography?: Patient palpated an upper-central left breast mass. She underwent left diagnostic mammography with tomography and left breast ultrasonography at [facility] on 05/24/2019 showing: breast density category C; 4.7 cm palpable suspicious left breast mass at 12:30; negative for lymphadenopathy.  Past/Anticipated interventions by surgeon, if any: 07/18/2019 Dr. Coralie Keens LEFT BREAST LUMPECTOMY (repeat lumpectomy for margin clearance TBD)  Past/Anticipated interventions by medical oncology, if any:  Under care of Dr. Sarajane Jews Magrinat:  08/07/2019 (1) adjuvant radiation (2) antiestrogens (3) additional surgery for margin clearance  Lymphedema issues, if any:  None    Pain issues, if any:  None  SAFETY ISSUES:  Prior radiation? No  Pacemaker/ICD? No  Possible current pregnancy? No  Is the patient on methotrexate? No  Current Complaints / other details:  Nothing of note

## 2019-08-26 NOTE — Progress Notes (Signed)
Radiation Oncology         [phone] ________________________________  Initial outpatient Consultation  Name: Kathy Cook MRN: [id number]  Date: 08/27/2019  DOB: 08-08-1945  CC:Unk Pinto, MD  Coralie Keens, MD   REFERRING PHYSICIAN: Coralie Keens, MD  DIAGNOSIS:    ICD-10-CM   1. Ductal carcinoma in situ (DCIS) of left breast  D05.12    Stage 0 Left Breast UOQ DCIS , ER+ / PR+, Grade 2  CHIEF COMPLAINT: Here to discuss management of left breast DCIS in the setting of a phyllodes tumor  [name] is a 74 y.o. female who presented with a palpable upper-central left breast mass; she had noticed this grown over several months.   Ultrasound of breast on 05/24/2019 revealed 4.7 cm left breast mass at 12:30.   Biopsy on date of 05/28/2019 showed fibroadenoma. This was felt to be discordant, and excision was recommended.   She proceeded to left lumpectomy on 07/18/2019 under Dr. Ninfa Linden. Pathology from the procedure initially showed DCIS arising in a fibroepithelial lesion. However, an outside pathology consultation was obtained, and the diagnosis was altered to read: combined glandular and stromal lesion, most consistent with phyllodes tumor, 3.9 cm with foci of DCIS (solid, cribriform, and pagetoid patterns).  The size of DCIS is estimated at 2.8 cm.  ER status: 95%; PR status 95%; Grade 2.  The phyllodes tumor extends to the anterior margin and within less than 1 mm of the inferior and medial margins.  The DCIS extends within less than 1 mm of the inferior margin.  Reexcision has been recommended.   She recalls that a mass was removed from that same area of her breast over 4 decades ago but it was benign.  She plays golf in her free time.  She is status post right knee surgery which was a difficult process.  She has met with medical oncology and talked about antiestrogens.  She has follow-up with them in August.  She denies lymphedema or  pain.   PREVIOUS RADIATION THERAPY: No  PAST MEDICAL HISTORY:  has a past medical history of Allergy, Anxiety, Attention deficit disorder (ADD), Cancer ([place]), Depression, Dry eyes, GERD (gastroesophageal reflux disease), HSV infection, Lumbar degenerative disc disease, Ocular rosacea, and Osteopenia.    PAST SURGICAL HISTORY: Past Surgical History:  Procedure Laterality Date  . BREAST LUMPECTOMY Left 07/18/2019   Procedure: LEFT BREAST LUMPECTOMY;  Surgeon: Coralie Keens, MD;  Location: [facility];  Service: General;  Laterality: Left;  LMA  . SKIN BIOPSY     ?basal/squam cell on chest  . TONSILLECTOMY AND ADENOIDECTOMY    . TOTAL KNEE ARTHROPLASTY Right 03/04/2019   Procedure: RIGHT TOTAL KNEE ARTHROPLASTY;  Surgeon: [name], MD;  Location: [facility];  Service: Orthopedics;  Laterality: Right;    FAMILY HISTORY: family history includes Depression in her mother; Diabetes in her father; Heart disease in her mother; Hypertension in her mother; Macular degeneration in her mother.  SOCIAL HISTORY:  reports that she quit smoking about 39 years ago. Her smoking use included cigarettes. She has a 7.50 pack-year smoking history. She has never used smokeless tobacco. She reports current alcohol use. She reports that she does not use drugs.  ALLERGIES: Prednisone, Statins, and Strattera [atomoxetine hcl]  MEDICATIONS:  Current Outpatient Medications  Medication Sig Dispense Refill  . amphetamine-dextroamphetamine (ADDERALL) 30 MG tablet Take 1/2 to 1 tablet 2 x /day as needed for Focus & Concentration (Patient taking differently:  Take 15-30 mg by mouth See admin instructions. Take 1/2 to 1 tablet 2 x /day as needed for Focus & Concentration) 60 tablet 0  . Bempedoic Acid (NEXLETOL) 180 MG TABS Take 1 tablet Daily for Cholesterol 90 tablet 1  . Colesevelam HCl 3.75 g PACK Dissolve 1 packet in water or juice & Drink Daily 90 each 3  . VITAMIN D PO Take 10,000 Units by mouth  daily.    . Estradiol-Estriol-Progesterone (BIEST/PROGESTERONE) CREA Place onto the skin. Marland Kitchen05 mg/ml cream to apply daily (Patient not taking: Reported on 08/16/2019)    . Progesterone Micronized 10 % CREA Place onto the skin. Patient applies 2 % cream daily (Patient not taking: Reported on 08/16/2019)     No current facility-administered medications for this encounter.    REVIEW OF SYSTEMS as above  PHYSICAL EXAM:  oral temperature is 97.9 F (36.6 C). Her blood pressure is 128/84 and her pulse is 60. Her respiration is 19 and oxygen saturation is 100%.   General: Alert and oriented, in no acute distress HEENT: Head is normocephalic.  Extremities: Vertical scar over right knee  Skin: No concerning lesions over exposed skin Psychiatric: Judgment and insight are intact. Affect is appropriate. Breasts: Left breast shows excellent healing of lumpectomy scar.   ECOG = 0  0 - Asymptomatic (Fully active, able to carry on all predisease activities without restriction)  1 - Symptomatic but completely ambulatory (Restricted in physically strenuous activity but ambulatory and able to carry out work of a light or sedentary nature. For example, light housework, office work)  2 - Symptomatic, <50% in bed during the day (Ambulatory and capable of all self care but unable to carry out any work activities. Up and about more than 50% of waking hours)  3 - Symptomatic, >50% in bed, but not bedbound (Capable of only limited self-care, confined to bed or chair 50% or more of waking hours)  4 - Bedbound (Completely disabled. Cannot carry on any self-care. Totally confined to bed or chair)  5 - Death   Eustace Pen MM, Creech RH, Tormey DC, et al. [phone]). "Toxicity and response criteria of the [facility]". [place] Oncol. 5 (6): 649-55   LABORATORY DATA:  Lab Results  Component Value Date   WBC 7.6 08/07/2019   HGB 13.6 08/07/2019   HCT 42.1 08/07/2019   MCV 98.4 08/07/2019    PLT 219 08/07/2019   CMP     Component Value Date/Time   NA 145 08/07/2019 1518   K 4.9 08/07/2019 1518   CL 106 08/07/2019 1518   CO2 26 08/07/2019 1518   GLUCOSE 111 (H) 08/07/2019 1518   BUN 20 08/07/2019 1518   CREATININE 1.11 (H) 08/07/2019 1518   CREATININE 0.82 05/06/2019 1455   CALCIUM 9.8 08/07/2019 1518   PROT 7.0 08/07/2019 1518   ALBUMIN 3.9 08/07/2019 1518   AST 17 08/07/2019 1518   ALT 18 08/07/2019 1518   ALKPHOS 78 08/07/2019 1518   BILITOT 0.4 08/07/2019 1518   GFRNONAA 50 (L) 08/07/2019 1518   GFRNONAA 72 05/06/2019 1455   GFRAA 58 (L) 08/07/2019 1518   GFRAA 83 05/06/2019 1455         RADIOGRAPHY: As above   IMPRESSION/PLAN: Left Breast DCIS in setting of phyllodes tumor  It was a pleasure meeting the patient today. We discussed the risks, benefits, and side effects of radiotherapy.  There is a clear role for radiotherapy in the setting of DCIS to reduce  the risk of local recurrence.  The role of radiotherapy in the setting of a phyllodes tumor is less clear.  It is possible that it could reduce the risk of local recurrence but there is no prospective randomized data to prove this.   Given that we at least have DCIS is part of the pathology, and strong data to demonstrate the efficacy of adjuvant radiotherapy, I recommend radiotherapy to the left breast to reduce her risk of locoregional recurrence by 1/2.  We discussed that radiation would take approximately 4 weeks to complete and that I would give the patient a few weeks to heal following surgery before starting treatment planning.  She is pending reexcision.  We spoke about acute effects including skin irritation and fatigue as well as much less common late effects including internal organ injury or irritation. We spoke about the latest technology that is used to minimize the risk of late effects for patients undergoing radiotherapy to the breast or chest wall. No guarantees of treatment were given. The  patient is enthusiastic about proceeding with treatment. I look forward to participating in the patient's care.  I will await her referral back to me for postoperative follow-up and eventual CT simulation/treatment planning.  On date of service, in total, I spent 50 minutes on this encounter. Patient was seen in person.   __________________________________________   Eppie Gibson, MD   This document serves as a record of services personally performed by Eppie Gibson, MD. It was created on her behalf by Wilburn Mylar, a trained medical scribe. The creation of this record is based on the scribe's personal observations and the provider's statements to them. This document has been checked and approved by the attending provider.

## 2019-08-27 ENCOUNTER — Ambulatory Visit
Admission: RE | Admit: 2019-08-27 | Discharge: 2019-08-27 | Disposition: A | Discharge status: Home / Self Care | Payer: Medicare Other | Source: Ambulatory Visit | Attending: Radiation Oncology | Admitting: Radiation Oncology

## 2019-08-27 ENCOUNTER — Other Ambulatory Visit: Payer: Self-pay

## 2019-08-27 ENCOUNTER — Encounter: Payer: Self-pay | Admitting: Radiation Oncology

## 2019-08-27 VITALS — BP 128/84 | HR 60 | Temp 97.9°F | Resp 19

## 2019-08-27 DIAGNOSIS — M5136 Other intervertebral disc degeneration, lumbar region: Secondary | ICD-10-CM | POA: Insufficient documentation

## 2019-08-27 DIAGNOSIS — M8580 Other specified disorders of bone density and structure, unspecified site: Secondary | ICD-10-CM | POA: Insufficient documentation

## 2019-08-27 DIAGNOSIS — D0512 Intraductal carcinoma in situ of left breast: Secondary | ICD-10-CM | POA: Insufficient documentation

## 2019-08-27 DIAGNOSIS — I10 Essential (primary) hypertension: Secondary | ICD-10-CM | POA: Insufficient documentation

## 2019-08-27 DIAGNOSIS — Z9889 Other specified postprocedural states: Secondary | ICD-10-CM | POA: Diagnosis not present

## 2019-08-27 DIAGNOSIS — F418 Other specified anxiety disorders: Secondary | ICD-10-CM | POA: Diagnosis not present

## 2019-08-27 DIAGNOSIS — Z87891 Personal history of nicotine dependence: Secondary | ICD-10-CM | POA: Diagnosis not present

## 2019-08-27 DIAGNOSIS — K219 Gastro-esophageal reflux disease without esophagitis: Secondary | ICD-10-CM | POA: Insufficient documentation

## 2019-08-27 DIAGNOSIS — F988 Other specified behavioral and emotional disorders with onset usually occurring in childhood and adolescence: Secondary | ICD-10-CM | POA: Insufficient documentation

## 2019-08-27 DIAGNOSIS — F329 Major depressive disorder, single episode, unspecified: Secondary | ICD-10-CM | POA: Diagnosis not present

## 2019-08-27 DIAGNOSIS — Z170 Estrogen receptor positive status [ER+]: Secondary | ICD-10-CM | POA: Diagnosis not present

## 2019-08-28 ENCOUNTER — Other Ambulatory Visit: Payer: Self-pay | Admitting: Internal Medicine

## 2019-08-28 DIAGNOSIS — E782 Mixed hyperlipidemia: Secondary | ICD-10-CM

## 2019-08-28 MED ORDER — NEXLIZET 180-10 MG PO TABS: ORAL_TABLET | ORAL | 3 refills | Status: DC

## 2019-08-30 ENCOUNTER — Telehealth: Payer: Self-pay | Admitting: *Deleted

## 2019-08-30 NOTE — Telephone Encounter (Signed)
[facility] aware of Nexlizet approval.

## 2019-09-03 ENCOUNTER — Other Ambulatory Visit (HOSPITAL_COMMUNITY)
Admission: RE | Admit: 2019-09-03 | Discharge: 2019-09-03 | Disposition: A | Discharge status: Home / Self Care | Payer: Medicare Other | Source: Ambulatory Visit | Attending: Surgery | Admitting: Surgery

## 2019-09-03 DIAGNOSIS — Z01812 Encounter for preprocedural laboratory examination: Secondary | ICD-10-CM | POA: Diagnosis not present

## 2019-09-03 DIAGNOSIS — Z20822 Contact with and (suspected) exposure to covid-19: Secondary | ICD-10-CM | POA: Diagnosis not present

## 2019-09-03 LAB — SARS CORONAVIRUS 2 (TAT 6-24 HRS): SARS Coronavirus 2: NEGATIVE

## 2019-09-04 ENCOUNTER — Encounter: Payer: Self-pay | Admitting: *Deleted

## 2019-09-04 ENCOUNTER — Telehealth: Payer: Self-pay | Admitting: *Deleted

## 2019-09-04 ENCOUNTER — Other Ambulatory Visit: Payer: Self-pay | Admitting: *Deleted

## 2019-09-04 ENCOUNTER — Encounter (HOSPITAL_COMMUNITY): Payer: Self-pay | Admitting: Surgery

## 2019-09-04 NOTE — Telephone Encounter (Signed)
Called patient in regard to her My Chart message about Nexlizet. Patient states she had IBS for a year after taking Zetia. Patient states she is having a resection of her beast cancer 09/05/2019, then radiation. She states she does not want to add a new RX at the time, due to a possible drug reaction.  Dr Melford Aase is aware.

## 2019-09-04 NOTE — Progress Notes (Signed)
Patient denies shortness of breath, fever, cough or chest pain.  PCP -  Dr Melford Aase Cardiologist - n/a  Chest x-ray - 02/20/19 EKG - n/a Stress Test - n/a ECHO - n/a Cardiac Cath - n/a  Aspirin Instructions: Last dose was on  09/03/19.  ERAS: Clear til 7:30 am DOS, no drink.  STOP now taking any Aspirin (unless otherwise instructed by your surgeon), Aleve, Naproxen, Ibuprofen, Motrin, Advil, Goody's, BC's, all herbal medications, fish oil, and all vitamins.   Coronavirus Screening Covid test on 09/03/19 was negative.  Patient verbalized understanding of instructions that were given via phone.

## 2019-09-04 NOTE — H&P (Signed)
   Kathy Cook Appointment: 08/26/2019 10:10 AM Location: [facility] Patient #: [id number] DOB: [birth year] Married / Language: Kathy Cook / Race: White Female   History of Present Illness (Azyiah Bo A. Ninfa Linden MD; 08/26/2019 10:35 AM) The patient is a 74 year old female presenting for a post-operative visit.  She is here for another postoperative visit. Again, she underwent a left breast lumpectomy. This was for a large mass which been biopsied showing a fibroadenoma but was felt to be discordant. She underwent a lumpectomy which showed DCIS with negative margins but also a phyllodes tumor. It took a while for the pathology come back on this tumor. Her DCIS was 95% ER/PR positive.   Allergies (Tanisha A. Owens Shark, [place]; 08/26/2019 10:10 AM) predniSONE *CORTICOSTEROIDS*  Statins  Allergies Reconciled   Medication History (Tanisha A. Owens Shark, [place]; 08/26/2019 10:10 AM) Adderall (Oral) Specific strength unknown - Active. Medications Reconciled  Vitals (Tanisha A. Brown RMA; 08/26/2019 10:11 AM) 08/26/2019 10:10 AM Height: 62in Weight Measurement Declined Temp.: 97.19F  Pulse: 72 (Regular)  BP: 128/86(Sitting, Left Arm, Standard)       Physical Exam (Leialoha Hanna A. Ninfa Linden MD; 08/26/2019 10:37 AM) The physical exam findings are as follows: Note: She appears well on exam Lungs clear CV RRR Left breast incision healing well   Assessment & Plan   POSTOP CHECK  S/P EXCISION OF LEFT BREAST DCIS AND PHYLLODES TUMOR  WITH POSITIVE MARGINS  Impression: We again discussed the final pathology. The recommendation is to re-excise the biopsy cavity to confirm negative margins from the phyllodes tumor before starting radiation therapy for the DCIS. We again discussed reasons for this with her in detail. Her husband is present with her.  We discussed the risks of surgery which includes but is not limited to bleeding, infection, the low chance of having positive margins,  postoperative recovery, etc. Surgery will be scheduled as soon as possible should she may heal up and start her radiation therapy

## 2019-09-05 ENCOUNTER — Encounter: Payer: Self-pay | Admitting: *Deleted

## 2019-09-05 ENCOUNTER — Other Ambulatory Visit: Payer: Self-pay

## 2019-09-05 ENCOUNTER — Ambulatory Visit (HOSPITAL_COMMUNITY): Payer: Medicare Other | Admitting: Anesthesiology

## 2019-09-05 ENCOUNTER — Encounter (HOSPITAL_COMMUNITY)
Admission: RE | Disposition: A | Discharge status: Home / Self Care | Payer: Self-pay | Source: Home / Self Care | Attending: Surgery

## 2019-09-05 ENCOUNTER — Ambulatory Visit (HOSPITAL_COMMUNITY)
Admission: RE | Admit: 2019-09-05 | Discharge: 2019-09-05 | Disposition: A | Discharge status: Home / Self Care | Payer: Medicare Other | Attending: Surgery | Admitting: Surgery

## 2019-09-05 DIAGNOSIS — Z87891 Personal history of nicotine dependence: Secondary | ICD-10-CM | POA: Insufficient documentation

## 2019-09-05 DIAGNOSIS — I10 Essential (primary) hypertension: Secondary | ICD-10-CM | POA: Diagnosis not present

## 2019-09-05 DIAGNOSIS — Z86000 Personal history of in-situ neoplasm of breast: Secondary | ICD-10-CM | POA: Insufficient documentation

## 2019-09-05 DIAGNOSIS — E785 Hyperlipidemia, unspecified: Secondary | ICD-10-CM | POA: Diagnosis not present

## 2019-09-05 DIAGNOSIS — N6082 Other benign mammary dysplasias of left breast: Secondary | ICD-10-CM | POA: Diagnosis not present

## 2019-09-05 DIAGNOSIS — R921 Mammographic calcification found on diagnostic imaging of breast: Secondary | ICD-10-CM | POA: Diagnosis not present

## 2019-09-05 DIAGNOSIS — M1990 Unspecified osteoarthritis, unspecified site: Secondary | ICD-10-CM | POA: Insufficient documentation

## 2019-09-05 DIAGNOSIS — D4862 Neoplasm of uncertain behavior of left breast: Secondary | ICD-10-CM | POA: Insufficient documentation

## 2019-09-05 DIAGNOSIS — Z888 Allergy status to other drugs, medicaments and biological substances status: Secondary | ICD-10-CM | POA: Diagnosis not present

## 2019-09-05 DIAGNOSIS — N6012 Diffuse cystic mastopathy of left breast: Secondary | ICD-10-CM | POA: Diagnosis not present

## 2019-09-05 DIAGNOSIS — Z79899 Other long term (current) drug therapy: Secondary | ICD-10-CM | POA: Insufficient documentation

## 2019-09-05 DIAGNOSIS — D0512 Intraductal carcinoma in situ of left breast: Secondary | ICD-10-CM | POA: Diagnosis not present

## 2019-09-05 DIAGNOSIS — N6092 Unspecified benign mammary dysplasia of left breast: Secondary | ICD-10-CM | POA: Diagnosis not present

## 2019-09-05 HISTORY — PX: RE-EXCISION OF BREAST LUMPECTOMY: SHX6048

## 2019-09-05 HISTORY — DX: Hyperlipidemia, unspecified: E78.5

## 2019-09-05 LAB — CBC
HCT: 39.7 % (ref 36.0–46.0)
Hemoglobin: 12.7 g/dL (ref 12.0–15.0)
MCH: 31.8 pg (ref 26.0–34.0)
MCHC: 32 g/dL (ref 30.0–36.0)
MCV: 99.3 fL (ref 80.0–100.0)
Platelets: 201 10*3/uL (ref 150–400)
RBC: 4 MIL/uL (ref 3.87–5.11)
RDW: 12.7 % (ref 11.5–15.5)
WBC: 5.8 10*3/uL (ref 4.0–10.5)
nRBC: 0 % (ref 0.0–0.2)

## 2019-09-05 LAB — BASIC METABOLIC PANEL
Anion gap: 8 (ref 5–15)
BUN: 16 mg/dL (ref 8–23)
CO2: 26 mmol/L (ref 22–32)
Calcium: 8.7 mg/dL — ABNORMAL LOW (ref 8.9–10.3)
Chloride: 107 mmol/L (ref 98–111)
Creatinine, Ser: 0.86 mg/dL (ref 0.44–1.00)
GFR calc Af Amer: >60 mL/min (ref >60)
GFR calc non Af Amer: >60 mL/min (ref >60)
Glucose, Bld: 106 mg/dL — ABNORMAL HIGH (ref 70–99)
Potassium: 4 mmol/L (ref 3.5–5.1)
Sodium: 141 mmol/L (ref 135–145)

## 2019-09-05 SURGERY — EXCISION, LESION, BREAST
Anesthesia: General | Site: Breast | Laterality: Left

## 2019-09-05 MED ORDER — FENTANYL CITRATE (PF) 100 MCG/2ML IJ SOLN: 25.0000 ug | INTRAMUSCULAR | Status: DC | PRN

## 2019-09-05 MED ORDER — ORAL CARE MOUTH RINSE: 15.0000 mL | Freq: Once | OROMUCOSAL | Status: AC

## 2019-09-05 MED ORDER — MIDAZOLAM HCL 5 MG/5ML IJ SOLN
INTRAMUSCULAR | Status: DC | PRN
  Administered 2019-09-05: 2 mg via INTRAVENOUS

## 2019-09-05 MED ORDER — DEXAMETHASONE SODIUM PHOSPHATE 4 MG/ML IJ SOLN
INTRAMUSCULAR | Status: DC | PRN
  Administered 2019-09-05: 8 mg via INTRAVENOUS

## 2019-09-05 MED ORDER — CEFAZOLIN SODIUM-DEXTROSE 2-4 GM/100ML-% IV SOLN
2.0000 g | INTRAVENOUS | Status: AC
  Administered 2019-09-05: 2 g via INTRAVENOUS

## 2019-09-05 MED ORDER — PHENYLEPHRINE 40 MCG/ML (10ML) SYRINGE FOR IV PUSH (FOR BLOOD PRESSURE SUPPORT)
PREFILLED_SYRINGE | INTRAVENOUS | Status: AC
  Filled 2019-09-05: qty 10

## 2019-09-05 MED ORDER — LIDOCAINE 2% (20 MG/ML) 5 ML SYRINGE
INTRAMUSCULAR | Status: AC
  Filled 2019-09-05: qty 5

## 2019-09-05 MED ORDER — BUPIVACAINE HCL (PF) 0.25 % IJ SOLN
INTRAMUSCULAR | Status: AC
  Filled 2019-09-05: qty 30

## 2019-09-05 MED ORDER — CHLORHEXIDINE GLUCONATE 0.12 % MT SOLN: 15.0000 mL | Freq: Once | OROMUCOSAL | Status: AC

## 2019-09-05 MED ORDER — ACETAMINOPHEN 500 MG PO TABS: 1000.0000 mg | ORAL_TABLET | ORAL | Status: AC

## 2019-09-05 MED ORDER — CHLORHEXIDINE GLUCONATE CLOTH 2 % EX PADS: 6.0000 | MEDICATED_PAD | Freq: Once | CUTANEOUS | Status: DC

## 2019-09-05 MED ORDER — EPHEDRINE SULFATE 50 MG/ML IJ SOLN
INTRAMUSCULAR | Status: DC | PRN
  Administered 2019-09-05: 10 mg via INTRAVENOUS

## 2019-09-05 MED ORDER — LACTATED RINGERS IV SOLN: INTRAVENOUS | Status: DC

## 2019-09-05 MED ORDER — DEXAMETHASONE SODIUM PHOSPHATE 10 MG/ML IJ SOLN
INTRAMUSCULAR | Status: AC
  Filled 2019-09-05: qty 1

## 2019-09-05 MED ORDER — OXYCODONE HCL 5 MG/5ML PO SOLN: 5.0000 mg | Freq: Once | ORAL | Status: AC | PRN

## 2019-09-05 MED ORDER — CHLORHEXIDINE GLUCONATE 0.12 % MT SOLN
OROMUCOSAL | Status: AC
  Administered 2019-09-05: 15 mL via OROMUCOSAL
  Filled 2019-09-05: qty 15

## 2019-09-05 MED ORDER — OXYCODONE HCL 5 MG PO TABS
ORAL_TABLET | ORAL | Status: AC
  Filled 2019-09-05: qty 1

## 2019-09-05 MED ORDER — BUPIVACAINE HCL (PF) 0.25 % IJ SOLN
INTRAMUSCULAR | Status: DC | PRN
  Administered 2019-09-05: 10 mL

## 2019-09-05 MED ORDER — ONDANSETRON HCL 4 MG/2ML IJ SOLN: 4.0000 mg | Freq: Four times a day (QID) | INTRAMUSCULAR | Status: DC | PRN

## 2019-09-05 MED ORDER — ACETAMINOPHEN 500 MG PO TABS
ORAL_TABLET | ORAL | Status: AC
  Administered 2019-09-05: 1000 mg via ORAL
  Filled 2019-09-05: qty 2

## 2019-09-05 MED ORDER — PROPOFOL 10 MG/ML IV BOLUS
INTRAVENOUS | Status: AC
  Filled 2019-09-05: qty 20

## 2019-09-05 MED ORDER — MIDAZOLAM HCL 2 MG/2ML IJ SOLN
INTRAMUSCULAR | Status: AC
  Filled 2019-09-05: qty 2

## 2019-09-05 MED ORDER — LACTATED RINGERS IV SOLN
INTRAVENOUS | Status: DC | PRN
Start: 2019-09-05 — End: 2019-09-05

## 2019-09-05 MED ORDER — OXYCODONE HCL 5 MG PO TABS
5.0000 mg | ORAL_TABLET | Freq: Once | ORAL | Status: AC | PRN
  Administered 2019-09-05: 5 mg via ORAL

## 2019-09-05 MED ORDER — CEFAZOLIN SODIUM-DEXTROSE 2-4 GM/100ML-% IV SOLN
INTRAVENOUS | Status: AC
  Filled 2019-09-05: qty 100

## 2019-09-05 MED ORDER — ENSURE PRE-SURGERY PO LIQD: 296.0000 mL | Freq: Once | ORAL | Status: DC

## 2019-09-05 MED ORDER — FENTANYL CITRATE (PF) 250 MCG/5ML IJ SOLN
INTRAMUSCULAR | Status: AC
  Filled 2019-09-05: qty 5

## 2019-09-05 MED ORDER — 0.9 % SODIUM CHLORIDE (POUR BTL) OPTIME
TOPICAL | Status: DC | PRN
  Administered 2019-09-05: 1000 mL

## 2019-09-05 MED ORDER — LIDOCAINE HCL (CARDIAC) PF 100 MG/5ML IV SOSY
PREFILLED_SYRINGE | INTRAVENOUS | Status: DC | PRN
  Administered 2019-09-05: 30 mg via INTRAVENOUS

## 2019-09-05 MED ORDER — ONDANSETRON HCL 4 MG/2ML IJ SOLN
INTRAMUSCULAR | Status: AC
  Filled 2019-09-05: qty 2

## 2019-09-05 MED ORDER — FENTANYL CITRATE (PF) 100 MCG/2ML IJ SOLN
INTRAMUSCULAR | Status: DC | PRN
  Administered 2019-09-05 (×2): 50 ug via INTRAVENOUS

## 2019-09-05 MED ORDER — GABAPENTIN 300 MG PO CAPS
ORAL_CAPSULE | ORAL | Status: AC
  Administered 2019-09-05: 300 mg via ORAL
  Filled 2019-09-05: qty 1

## 2019-09-05 MED ORDER — GLYCOPYRROLATE PF 0.2 MG/ML IJ SOSY
PREFILLED_SYRINGE | INTRAMUSCULAR | Status: AC
  Filled 2019-09-05: qty 1

## 2019-09-05 MED ORDER — GABAPENTIN 300 MG PO CAPS: 300.0000 mg | ORAL_CAPSULE | ORAL | Status: AC

## 2019-09-05 MED ORDER — PROPOFOL 10 MG/ML IV BOLUS
INTRAVENOUS | Status: DC | PRN
  Administered 2019-09-05: 200 mg via INTRAVENOUS

## 2019-09-05 SURGICAL SUPPLY — 34 items
CANISTER SUCT 3000ML PPV (MISCELLANEOUS) IMPLANT
CHLORAPREP W/TINT 26 (MISCELLANEOUS) ×3 IMPLANT
CNTNR URN SCR LID CUP LEK RST (MISCELLANEOUS) ×1 IMPLANT
CONT SPEC 4OZ STRL OR WHT (MISCELLANEOUS) ×3
COVER SURGICAL LIGHT HANDLE (MISCELLANEOUS) ×3 IMPLANT
COVER WAND RF STERILE (DRAPES) ×3 IMPLANT
DECANTER SPIKE VIAL GLASS SM (MISCELLANEOUS) IMPLANT
DERMABOND ADVANCED (GAUZE/BANDAGES/DRESSINGS) ×2
DERMABOND ADVANCED .7 DNX12 (GAUZE/BANDAGES/DRESSINGS) ×1 IMPLANT
DRAPE CHEST BREAST 15X10 FENES (DRAPES) ×3 IMPLANT
DRSG TEGADERM 4X4.75 (GAUZE/BANDAGES/DRESSINGS) ×3 IMPLANT
ELECT REM PT RETURN 9FT ADLT (ELECTROSURGICAL) ×3
ELECTRODE REM PT RTRN 9FT ADLT (ELECTROSURGICAL) ×1 IMPLANT
GAUZE SPONGE 4X4 12PLY STRL (GAUZE/BANDAGES/DRESSINGS) ×3 IMPLANT
GLOVE SURG SIGNA 7.5 PF LTX (GLOVE) ×3 IMPLANT
GOWN STRL REUS W/ TWL LRG LVL3 (GOWN DISPOSABLE) ×1 IMPLANT
GOWN STRL REUS W/ TWL XL LVL3 (GOWN DISPOSABLE) ×1 IMPLANT
GOWN STRL REUS W/TWL LRG LVL3 (GOWN DISPOSABLE) ×3
GOWN STRL REUS W/TWL XL LVL3 (GOWN DISPOSABLE) ×3
KIT BASIN OR (CUSTOM PROCEDURE TRAY) ×3 IMPLANT
KIT MARKER MARGIN INK (KITS) ×3 IMPLANT
KIT TURNOVER KIT B (KITS) ×3 IMPLANT
NEEDLE HYPO 25GX1X1/2 BEV (NEEDLE) ×3 IMPLANT
NS IRRIG 1000ML POUR BTL (IV SOLUTION) ×3 IMPLANT
PACK GENERAL/GYN (CUSTOM PROCEDURE TRAY) ×3 IMPLANT
PAD ARMBOARD 7.5X6 YLW CONV (MISCELLANEOUS) ×3 IMPLANT
PENCIL SMOKE EVACUATOR (MISCELLANEOUS) ×3 IMPLANT
SPONGE LAP 4X18 RFD (DISPOSABLE) ×3 IMPLANT
SUT MNCRL AB 4-0 PS2 18 (SUTURE) ×3 IMPLANT
SUT VIC AB 3-0 SH 27 (SUTURE) ×3
SUT VIC AB 3-0 SH 27X BRD (SUTURE) ×1 IMPLANT
SYR CONTROL 10ML LL (SYRINGE) ×3 IMPLANT
TOWEL GREEN STERILE (TOWEL DISPOSABLE) ×3 IMPLANT
TOWEL GREEN STERILE FF (TOWEL DISPOSABLE) ×3 IMPLANT

## 2019-09-05 NOTE — Anesthesia Postprocedure Evaluation (Signed)
Anesthesia Post Note  Patient: Kathy Cook  Procedure(s) Performed: RE-EXCISION OF LEFT BREAST TUMOR (Left Breast)     Patient location during evaluation: PACU Anesthesia Type: General Level of consciousness: awake and alert Pain management: pain level controlled Vital Signs Assessment: post-procedure vital signs reviewed and stable Respiratory status: spontaneous breathing, nonlabored ventilation, respiratory function stable and patient connected to nasal cannula oxygen Cardiovascular status: blood pressure returned to baseline and stable Postop Assessment: no apparent nausea or vomiting Anesthetic complications: no   No complications documented.  Last Vitals:  Vitals:   09/05/19 1145 09/05/19 1157  BP: 135/64 130/66  Pulse: 69 64  Resp: (!) 21 16  Temp:  (!) 36.1 C  SpO2: 97% 95%    Last Pain:  Vitals:   09/05/19 1157  TempSrc:   PainSc: 2                  Nolyn Swab S

## 2019-09-05 NOTE — Anesthesia Preprocedure Evaluation (Signed)
Anesthesia Evaluation  Patient identified by MRN, date of birth, ID band Patient awake    Reviewed: Allergy & Precautions, H&P , NPO status , Patient's Chart, lab work & pertinent test results  Airway Mallampati: II   Neck ROM: full    Dental   Pulmonary former smoker,    breath sounds clear to auscultation       Cardiovascular hypertension,  Rhythm:regular Rate:Normal     Neuro/Psych PSYCHIATRIC DISORDERS Anxiety Depression    GI/Hepatic GERD  ,  Endo/Other    Renal/GU      Musculoskeletal  (+) Arthritis ,   Abdominal   Peds  Hematology  (+) Blood dyscrasia, anemia ,   Anesthesia Other Findings   Reproductive/Obstetrics                             Anesthesia Physical Anesthesia Plan  ASA: II  Anesthesia Plan: General   Post-op Pain Management:    Induction: Intravenous  PONV Risk Score and Plan: 3 and Ondansetron, Dexamethasone and Treatment may vary due to age or medical condition  Airway Management Planned: LMA  Additional Equipment:   Intra-op Plan:   Post-operative Plan: Extubation in OR  Informed Consent: I have reviewed the patients History and Physical, chart, labs and discussed the procedure including the risks, benefits and alternatives for the proposed anesthesia with the patient or authorized representative who has indicated his/her understanding and acceptance.       Plan Discussed with: CRNA, Anesthesiologist and Surgeon  Anesthesia Plan Comments:         Anesthesia Quick Evaluation

## 2019-09-05 NOTE — Transfer of Care (Signed)
2Immediate Anesthesia Transfer of Care Note  Patient: Kathy Cook  Procedure(s) Performed: RE-EXCISION OF LEFT BREAST TUMOR (Left Breast)  Patient Location: PACU  Anesthesia Type:General  Level of Consciousness: awake, alert  and oriented  Airway & Oxygen Therapy: Patient Spontanous Breathing  Post-op Assessment: Report given to RN and Post -op Vital signs reviewed and stable  Post vital signs: Reviewed and stable  Last Vitals:  Vitals Value Taken Time  BP 131/69 09/05/19 1127  Temp    Pulse 79 09/05/19 1130  Resp 18 09/05/19 1130  SpO2 95 % 09/05/19 1130  Vitals shown include unvalidated device data.  Last Pain:  Vitals:   09/05/19 1127  TempSrc:   PainSc: (P) 6       Patients Stated Pain Goal: (P) 3 (47/09/62 8366)  Complications: No complications documented.

## 2019-09-05 NOTE — Discharge Instructions (Signed)
[facility] °Office Phone Number [phone] ° °BREAST BIOPSY/ PARTIAL MASTECTOMY: POST OP INSTRUCTIONS ° °Always review your discharge instruction sheet given to you by the facility where your surgery was performed. ° °IF YOU HAVE DISABILITY OR FAMILY LEAVE FORMS, YOU MUST BRING THEM TO THE OFFICE FOR PROCESSING.  DO NOT GIVE THEM TO YOUR DOCTOR. ° °1. A prescription for pain medication may be given to you upon discharge.  Take your pain medication as prescribed, if needed.  If narcotic pain medicine is not needed, then you may take acetaminophen (Tylenol) or ibuprofen (Advil) as needed. °2. Take your usually prescribed medications unless otherwise directed °3. If you need a refill on your pain medication, please contact your pharmacy.  They will contact our office to request authorization.  Prescriptions will not be filled after 5pm or on week-ends. °4. You should eat very light the first 24 hours after surgery, such as soup, crackers, pudding, etc.  Resume your normal diet the day after surgery. °5. Most patients will experience some swelling and bruising in the breast.  Ice packs and a good support bra will help.  Swelling and bruising can take several days to resolve.  °6. It is common to experience some constipation if taking pain medication after surgery.  Increasing fluid intake and taking a stool softener will usually help or prevent this problem from occurring.  A mild laxative (Milk of Magnesia or Miralax) should be taken according to package directions if there are no bowel movements after 48 hours. °7. Unless discharge instructions indicate otherwise, you may remove your bandages 24-48 hours after surgery, and you may shower at that time.  You may have steri-strips (small skin tapes) in place directly over the incision.  These strips should be left on the skin for 7-10 days.  If your surgeon used skin glue on the incision, you may shower in 24 hours.  The glue will flake off over the  next 2-3 weeks.  Any sutures or staples will be removed at the office during your follow-up visit. °8. ACTIVITIES:  You may resume regular daily activities (gradually increasing) beginning the next day.  Wearing a good support bra or sports bra minimizes pain and swelling.  You may have sexual intercourse when it is comfortable. °a. You may drive when you no longer are taking prescription pain medication, you can comfortably wear a seatbelt, and you can safely maneuver your car and apply brakes. °b. RETURN TO WORK:  ______________________________________________________________________________________ °9. You should see your doctor in the office for a follow-up appointment approximately two weeks after your surgery.  Your doctor’s nurse will typically make your follow-up appointment when she calls you with your pathology report.  Expect your pathology report 2-3 business days after your surgery.  You may call to check if you do not hear from us after three days. °10. OTHER INSTRUCTIONS: _______________________________________________________________________________________________ _____________________________________________________________________________________________________________________________________ °_____________________________________________________________________________________________________________________________________ °_____________________________________________________________________________________________________________________________________ ° °WHEN TO CALL YOUR DOCTOR: °1. Fever over 101.0 °2. Nausea and/or vomiting. °3. Extreme swelling or bruising. °4. Continued bleeding from incision. °5. Increased pain, redness, or drainage from the incision. ° °The clinic staff is available to answer your questions during regular business hours.  Please don’t hesitate to call and ask to speak to one of the nurses for clinical concerns.  If you have a medical emergency, go to the nearest  emergency room or call 911.  A surgeon from [facility] is always on call at the hospital. ° °For further questions, please visit [email or web address]  °

## 2019-09-05 NOTE — Interval H&P Note (Signed)
History and Physical Interval Note: no change in H and P  09/05/2019 8:30 AM  Kathy Cook  has presented today for surgery, with the diagnosis of PHYLLODES TUMOR LEFT BREAST.  The various methods of treatment have been discussed with the patient and family. After consideration of risks, benefits and other options for treatment, the patient has consented to  Procedure(s) with comments: RE-EXCISION OF LEFT BREAST TUMOR (Left) - LMA as a surgical intervention.  The patient's history has been reviewed, patient examined, no change in status, stable for surgery.  I have reviewed the patient's chart and labs.  Questions were answered to the patient's satisfaction.     Coralie Keens

## 2019-09-05 NOTE — Op Note (Signed)
RE-EXCISION OF LEFT BREAST TUMOR  Procedure Note  Kathy Cook 09/05/2019   Pre-op Diagnosis: LEFT BREAST PHYLLODES TUMOR AND DCIS WITH POSITIVE MARGIN     Post-op Diagnosis: SAME  Procedure(s): RE-EXCISION OF LEFT BREAST TUMOR  Surgeon(s): Coralie Keens, MD  Anesthesia: General  Staff:  Circulator: Jeanie Cooks, RN Scrub Person: Flavia Shipper, CST; Swicegood, Alver Fisher W  Estimated Blood Loss: Minimal               Specimens: sent to path  Indications: This is a 74 year old female who underwent left breast lumpectomy for a fibroepithelial lesion.  The final pathology showed both DCIS and a phyllodes tumor.  Margins were negative for the DCIS but close for the other tumor at the medial and inferior margins and positive the anterior margin.  The decision was made to reexcised the margins  Procedure: The patient was brought to the operating room identifies correct patient.  She is placed upon the operating table and general anesthesia was induced.  Her left breast was prepped and draped in the usual sterile fashion.  I anesthetized the skin the previous incision with Marcaine.  I then made incision with a scalpel through the previous scar.  I then first excised the anterior margin with the electrocautery.  I painted the most anterior portion of the margin with paint and sent to pathology.  I then next excised the inferior margin with the cautery and marked the new margin with pain as well and sent it to pathology.  I next excised the medial margin with the cautery and again sent this after painting it to pathology.  There was no remaining seroma fluid from the previous biopsy.  The actual cavity itself was easy to identify and the margins were fairly easy to obtain.  Hemostasis was achieved with the cautery.  I anesthetized the incision further with Marcaine.  I then closed subcutaneous tissue with interrupted 3-0 Vicryl sutures and closed the skin with a running 4-0 Monocryl.   Dermabond was applied.  The patient tolerated the procedure well.  All counts were correct at the end of the procedure.  She was then extubated in the operating room and taken in stable condition to the recovery room.          Coralie Keens   Date: 09/05/2019  Time: 11:17 AM

## 2019-09-05 NOTE — Anesthesia Procedure Notes (Signed)
Procedure Name: LMA Insertion Date/Time: 09/05/2019 10:44 AM Performed by: Eligha Bridegroom, CRNA Pre-anesthesia Checklist: Patient identified, Emergency Drugs available, Suction available, Patient being monitored and Timeout performed Patient Re-evaluated:Patient Re-evaluated prior to induction Oxygen Delivery Method: Circle system utilized Preoxygenation: Pre-oxygenation with 100% oxygen Induction Type: IV induction LMA: LMA inserted LMA Size: 4.0 Number of attempts: 1 Tube secured with: Tape Dental Injury: Teeth and Oropharynx as per pre-operative assessment

## 2019-09-06 ENCOUNTER — Encounter (HOSPITAL_COMMUNITY): Payer: Self-pay | Admitting: Surgery

## 2019-09-07 NOTE — Addendum Note (Signed)
Addendum  created 09/07/19 1059 by Albertha Ghee, MD   Intraprocedure Staff edited

## 2019-09-09 ENCOUNTER — Encounter: Payer: Self-pay | Admitting: *Deleted

## 2019-09-09 LAB — SURGICAL PATHOLOGY

## 2019-09-17 ENCOUNTER — Encounter: Payer: Self-pay | Admitting: *Deleted

## 2019-09-17 DIAGNOSIS — D0512 Intraductal carcinoma in situ of left breast: Secondary | ICD-10-CM

## 2019-09-19 ENCOUNTER — Encounter: Payer: Self-pay | Admitting: *Deleted

## 2019-10-01 ENCOUNTER — Other Ambulatory Visit: Payer: Self-pay

## 2019-10-01 ENCOUNTER — Ambulatory Visit
Admission: RE | Admit: 2019-10-01 | Discharge: 2019-10-01 | Disposition: A | Discharge status: Home / Self Care | Payer: Medicare Other | Source: Ambulatory Visit | Attending: Radiation Oncology | Admitting: Radiation Oncology

## 2019-10-01 ENCOUNTER — Encounter: Payer: Self-pay | Admitting: Radiation Oncology

## 2019-10-01 VITALS — BP 147/79 | HR 70 | Temp 98.4°F | Resp 20 | Ht 62.5 in | Wt 201.8 lb

## 2019-10-01 DIAGNOSIS — Z170 Estrogen receptor positive status [ER+]: Secondary | ICD-10-CM | POA: Diagnosis not present

## 2019-10-01 DIAGNOSIS — D0512 Intraductal carcinoma in situ of left breast: Secondary | ICD-10-CM | POA: Diagnosis not present

## 2019-10-01 DIAGNOSIS — Z510 Encounter for antineoplastic radiation therapy: Secondary | ICD-10-CM | POA: Diagnosis not present

## 2019-10-01 DIAGNOSIS — Z7982 Long term (current) use of aspirin: Secondary | ICD-10-CM | POA: Diagnosis not present

## 2019-10-01 DIAGNOSIS — Z79899 Other long term (current) drug therapy: Secondary | ICD-10-CM | POA: Insufficient documentation

## 2019-10-01 DIAGNOSIS — Z9889 Other specified postprocedural states: Secondary | ICD-10-CM | POA: Diagnosis not present

## 2019-10-01 NOTE — Progress Notes (Signed)
Location of Breast Cancer: Ductal carcinoma in situ (DCIS) of LEFT breast  Histology per Pathology Report:  09/05/2019 FINAL MICROSCOPIC DIAGNOSIS:  A. BREAST, LEFT ANTERIOR MARGIN, EXCISION:  - Findings consistent with previous lumpectomy.  - Fibrocystic changes with calcifications.  - No ductal carcinoma in situ or phyllodes tumor identified.  - Final anterior margin clear.  B. BREAST, LEFT INFERIOR MARGIN, EXCISION:  - Findings consistent with previous lumpectomy.  - Fibrocystic changes with calcifications.  - No ductal carcinoma in situ or phyllodes tumor identified.  - Final inferior margin clear.  C. BREAST, LEFT MEDIAL MARGIN, EXCISION:  - Findings consistent with previous lumpectomy.  - Fibrocystic changes with focal usual ductal hyperplasia and  calcifications.  - No ductal carcinoma in situ or phyllodes tumor identified.  - Final medial margin clear.  07/18/2019 FINAL MICROSCOPIC DIAGNOSIS:  A. BREAST, LEFT, LUMPECTOMY:  - Combined glandular and stromal lesion, most consistent with phyllodes  tumor, 3.9 cm with foci of ductal carcinoma in situ, intermediate  nuclear grade, solid, cribriform and pagetoid patterns  - See oncology table and consultant comment below  COMMENT:  Within the fibroepithelial lesion, there is ductal carcinoma in situ.  This diagnosis is supported by the presence of myoepithelial cells (SMM,  p63 and calponin) in the loss of cytokeratin 5/6 expression and  epithelial proliferation. The fibroepithelial lesion extends to the  inked resection margins. However, the ductal carcinoma in situ does not  involve the margins.   Receptor Status: ER(95%), PR (95%)  Did patient present with symptoms (if so, please note symptoms) or was this found on screening mammography?: Patient palpated an upper-central left breast mass. She underwentleftdiagnostic mammography with tomography and leftbreast ultrasonography at [facility] on  3/26/2021showing: breast density category C;4.7 cm palpable suspicious left breast mass at 12:30; negative for lymphadenopathy.  Past/Anticipated interventions by surgeon, if any: 09/05/2019 Dr. Coralie Keens RE-EXCISION OF LEFT BREAST TUMOR 07/18/2019 Dr. Coralie Keens LEFT BREAST LUMPECTOMY  Past/Anticipated interventions by medical oncology, if any:  Under care of Dr. Sarajane Jews Magrinat:  08/07/2019 (1) adjuvant radiation (2) antiestrogens (3) additional surgery for margin clearance  Lymphedema issues, if any:  None    Pain issues, if any:  Left breast is still very sore and tender since re-excision surgery. Patient reports it is still bruised as well.  SAFETY ISSUES:  Prior radiation? No  Pacemaker/ICD? No  Possible current pregnancy? No  Is the patient on methotrexate? No  Current Complaints / other details: Nothing of note

## 2019-10-01 NOTE — Progress Notes (Signed)
Radiation Oncology         [phone] ________________________________  Name: Kathy Cook MRN: [id number]  Date: 10/01/2019  DOB: Aug 09, 1945  Follow-Up Visit Note  Outpatient  CC: Unk Pinto, MD  Unk Pinto, MD  Diagnosis:      ICD-10-CM   1. Ductal carcinoma in situ (DCIS) of left breast  D05.12      CHIEF COMPLAINT: Here to discuss management of left breast phyllodes tumor and DCIS  Narrative:  The patient returns today for follow-up.      Since I last saw her, she underwent reexcision on 09/05/2019. This revealed no evidence of residual DCIS or phyllodes tumor.  She reports significant tenderness and bruising in the left breast since reexcision.    ALLERGIES:  is allergic to zetia [ezetimibe], prednisone, statins, and strattera [atomoxetine hcl].  Meds: Current Outpatient Medications  Medication Sig Dispense Refill  . acetaminophen (TYLENOL) 500 MG tablet Take 500 mg by mouth every 6 (six) hours as needed for moderate pain or headache.    . amphetamine-dextroamphetamine (ADDERALL) 30 MG tablet Take 1/2 to 1 tablet 2 x /day as needed for Focus & Concentration (Patient taking differently: Take 7.5 mg by mouth daily as needed (Focus & Concentration). ) 60 tablet 0  . aspirin EC 81 MG tablet Take 81 mg by mouth daily. Swallow whole.    . Cholecalciferol (DIALYVITE VITAMIN D 5000) 125 MCG (5000 UT) capsule Take 10,000 Units by mouth daily.    Marland Kitchen L-Tyrosine 500 MG CAPS Take 500 mg by mouth daily.    Marland Kitchen loratadine (CLARITIN) 10 MG tablet Take 10 mg by mouth daily as needed for allergies.    . Olopatadine HCl (PATADAY OP) Place 1 drop into both eyes daily as needed (dry eyes).    Marland Kitchen OVER THE COUNTER MEDICATION Take 550 mg by mouth daily. [place] Tail Mushroom otc supplement    . OVER THE COUNTER MEDICATION Apply 1 application topically daily as needed (swelling / pain). Hemp cream    . Vitamin E Skin OIL Apply 1 application topically daily as needed (scarring).      No current facility-administered medications for this encounter.    Physical Findings:  height is 5' 2.5" (1.588 m) and weight is 201 lb 12.8 oz (91.5 kg). Her temperature is 98.4 F (36.9 C). Her blood pressure is 147/79 (abnormal) and her pulse is 70. Her respiration is 20 and oxygen saturation is 98%. .     General: Alert and oriented, in no acute distress Breast exam reveals left breast seroma with overlying bruising, tender to palpation, no sign of infection  Lab Findings: Lab Results  Component Value Date   WBC 5.8 09/05/2019   HGB 12.7 09/05/2019   HCT 39.7 09/05/2019   MCV 99.3 09/05/2019   PLT 201 09/05/2019     Radiographic Findings: No results found.  Impression/Plan: This is a wonderful 74 year old woman with history of left breast DCIS in setting of phyllodes tumor  Given her clear margins, I now believe we can proceed with CT simulation to plan radiotherapy to the left breast; as discussed before, this will reduce her risk of locoregional recurrence by 1/2.  We discussed that radiation would take approximately 4 weeks.  We spoke about acute effects including skin irritation and fatigue as well as much less common late effects including internal organ injury or irritation. We spoke about the latest technology that is used to minimize the risk of late effects for patients undergoing radiotherapy to  the breast or chest wall. No guarantees of treatment were given. The patient is enthusiastic about proceeding with treatment. I look forward to participating in the patient's care.    I will delay her radiotherapy until August 16 to allow her more time to heal. She is quite sore. I suggested she try Tylenol, and cold compresses, and call her surgeon's nurse for more guidance if she wishes for symptom management.   On date of service, in total, I spent 25 minutes on this encounter. Patient was seen in person.  _____________________________________   Eppie Gibson, MD

## 2019-10-03 ENCOUNTER — Encounter: Payer: Self-pay | Admitting: *Deleted

## 2019-10-04 DIAGNOSIS — Z510 Encounter for antineoplastic radiation therapy: Secondary | ICD-10-CM | POA: Diagnosis not present

## 2019-10-04 DIAGNOSIS — D0512 Intraductal carcinoma in situ of left breast: Secondary | ICD-10-CM | POA: Diagnosis not present

## 2019-10-06 NOTE — Progress Notes (Signed)
[place]  Telephone:([phone] Fax:(336) [phone]     ID: Kathy Cook DOB: [birth year]  MR#: [id number]  LKJ#:[id number]  Patient Care Team: Unk Pinto, MD as PCP - General (Internal Medicine) Dian Queen, MD as Consulting Physician (Obstetrics and Gynecology) Clent Jacks, MD as Consulting Physician (Ophthalmology) Lindwood Coke, MD as Consulting Physician (Dermatology) Macy Lingenfelter, Virgie Dad, MD as Consulting Physician (Oncology) Kathy Keens, MD as Consulting Physician (General Surgery) Eppie Gibson, MD as Attending Physician (Radiation Oncology) Kathy Killings, MD as Consulting Physician (Orthopedic Surgery) Mauro Kaufmann, RN as Oncology Nurse Navigator Rockwell Germany, RN as Oncology Nurse Navigator Chauncey Cruel, MD OTHER MD:  CHIEF COMPLAINT: estrogen receptor positive breast cancer, phyllodes tumor  CURRENT TREATMENT: Adjuvant radiation   INTERVAL HISTORY: Kathy Cook returns today for follow up of her estrogen receptor positive breast cancer. She was evaluated in the breast cancer clinic on 08/07/2019.   Since her last visit, she underwent re-excision of the positive left breast margin (positive for a fibroepithelial lesion) on 09/05/2019 under Dr. Ninfa Cook. Pathology from the procedure [id number]) was negative for ductal carcinoma in situ or phyllodes tumor.   She met back with Dr. Isidore Cook on 10/01/2019.  Kathy Cook is scheduled to begin radiation next week.  REVIEW OF SYSTEMS: Kathy Cook is very frustrated.  Her breast still hurts several weeks after the surgery.  It is still bruised.  She is going to be seeing Dr. Ninfa Cook later today.  She is taking Tylenol for this intermittently and it is not working.  She is reluctant to take nonsteroidals because she is worried about her kidneys.  She is not moving her left arm very much because of this discomfort.  In addition of course she has the right knee pain from the surgery there and she has  significant plantar fasciitis bilaterally which is together with the other pains driving her crazy.  She is "tired of all this".  Aside from these problems however a detailed review of systems today was stable   HISTORY OF CURRENT ILLNESS: From the original intake note:  Kathy Cook herself palpated an upper-central left breast mass. She underwent left diagnostic mammography with tomography and left breast ultrasonography at [facility] on 05/24/2019 showing: breast density category C; 4.7 cm palpable suspicious left breast mass at 12:30; negative for lymphadenopathy.  Accordingly on 05/28/2019 she proceeded to biopsy of the left breast area in question. The pathology from this procedure ([id number]) showed: fibroadenoma.  This was felt to be discordant and excision was recommended.  She opted to proceed with left lumpectomy on 07/18/2019 under Dr. Ninfa Cook. Pathology [id number]) revealed: ductal carcinoma in situ, low-grade, arising in a fibroepithelial lesion, 3.9 cm; margins were clear for the ductal carcinoma in situ but positive for the fibroepithelial lesion; prognostic indicators for the ductal carcinoma in situ significant for: estrogen receptor, 95% positive and progesterone receptor, 95% positive, both with strong to moderate staining intensity.   The patient's subsequent history is as detailed below.   PAST MEDICAL HISTORY: Past Medical History:  Diagnosis Date  . Allergy   . Anxiety   . Attention deficit disorder (ADD)   . Cancer [facility])    ? basal/squam cell on chest, and forehead  . Depression   . Dry eyes   . GERD (gastroesophageal reflux disease)    diet controlled, no meds  . HLD (hyperlipidemia)   . HSV infection   . Lumbar degenerative disc disease   . Ocular rosacea   .  Osteopenia     PAST SURGICAL HISTORY: Past Surgical History:  Procedure Laterality Date  . BREAST LUMPECTOMY Left 07/18/2019   Procedure: LEFT BREAST LUMPECTOMY;  Surgeon: Kathy Keens, MD;  Location: [place];  Service: General;  Laterality: Left;  LMA  . COLONOSCOPY  2019  . RE-EXCISION OF BREAST LUMPECTOMY Left 09/05/2019   Procedure: RE-EXCISION OF LEFT BREAST TUMOR;  Surgeon: Kathy Keens, MD;  Location: [place];  Service: General;  Laterality: Left;  LMA  . SKIN BIOPSY     ?basal/squam cell on chest  . TONSILLECTOMY AND ADENOIDECTOMY    . TOTAL KNEE ARTHROPLASTY Right 03/04/2019   Procedure: RIGHT TOTAL KNEE ARTHROPLASTY;  Surgeon: Kathy Killings, MD;  Location: [place];  Service: Orthopedics;  Laterality: Right;  . UPPER GI ENDOSCOPY      FAMILY HISTORY: Family History  Problem Relation Age of Onset  . Diabetes Father   . Hypertension Mother   . Depression Mother   . Macular degeneration Mother   . Heart disease Mother   The patient's father died at age 71 following a fall.  The patient's mother died at age 2 with heart disease problems.  The patient has 2 brothers no sisters.  1 brother has had his thyroid removed and may have had thyroid carcinoma; the second brother is a Teacher, music in [place].  There is no cancer in the family to the patient's knowledge.   GYNECOLOGIC HISTORY:  No LMP recorded (lmp unknown). Patient is postmenopausal. Menarche: 74 years old Age at first live birth: 74 years old [place] P 2 LMP 75 Hormone replacement; yes, more than 20 years Hysterectomy: no BSO? 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  Her husband of more than 50 years Kathy Cook ("[name]") works in finances.  Son Kathy Cook runs a business in [place].  Daughter Kathy Cook is an attorney planning to move to [place] soon.  The patient has 2 grandchildren.  She is a member of [employer]: In the absence of any documents to the contrary the patient's husband is her healthcare power of attorney   HEALTH MAINTENANCE: Social History   Tobacco Use  .  Smoking status: Former Smoker    Packs/day: 0.50    Years: 15.00    Pack years: 7.50    Types: Cigarettes    Quit date: 03/23/1980    Years since quitting: 39.5  . Smokeless tobacco: Never Used  Vaping Use  . Vaping Use: Never used  Substance Use Topics  . Alcohol use: Yes    Alcohol/week: 0.0 standard drinks    Comment: social  . Drug use: No     Colonoscopy: 06/2017 (Dr. Mayo Ao further colonoscopies planned  PAP: 2020 (Dr. Helane Rima)  Bone density: 2019 (at [facility])   Allergies  Allergen Reactions  . Zetia [Ezetimibe] Diarrhea    Patient states she had IBS, that took a year to resolve.  . Prednisone Nausea And Vomiting  . Statins     paralyzing  . Strattera [Atomoxetine Hcl]     Increased sadness    Current Outpatient Medications  Medication Sig Dispense Refill  . acetaminophen (TYLENOL) 500 MG tablet Take 500 mg by mouth every 6 (six) hours as needed for moderate pain or headache.    . amphetamine-dextroamphetamine (ADDERALL) 30 MG tablet Take 1/2 to 1 tablet 2 x /day as needed for Focus & Concentration (Patient taking differently:  Take 7.5 mg by mouth daily as needed (Focus & Concentration). ) 60 tablet 0  . aspirin EC 81 MG tablet Take 81 mg by mouth daily. Swallow whole.    . Cholecalciferol (DIALYVITE VITAMIN D 5000) 125 MCG (5000 UT) capsule Take 10,000 Units by mouth daily.    Marland Kitchen L-Tyrosine 500 MG CAPS Take 500 mg by mouth daily.    Marland Kitchen loratadine (CLARITIN) 10 MG tablet Take 10 mg by mouth daily as needed for allergies.    . Olopatadine HCl (PATADAY OP) Place 1 drop into both eyes daily as needed (dry eyes).    Marland Kitchen OVER THE COUNTER MEDICATION Take 550 mg by mouth daily. [place] Tail Mushroom otc supplement    . OVER THE COUNTER MEDICATION Apply 1 application topically daily as needed (swelling / pain). Hemp cream    . Vitamin E Skin OIL Apply 1 application topically daily as needed (scarring).     No current facility-administered medications for this visit.      OBJECTIVE: White woman who appears younger than stated age  28:   10/07/19 1103  BP: (!) 144/71  Pulse: 70  Resp: 20  Temp: 97.9 F (36.6 C)  SpO2: 98%     Body mass index is 36.32 kg/m.   Wt Readings from Last 3 Encounters:  10/01/19 201 lb 12.8 oz (91.5 kg)  09/05/19 190 lb (86.2 kg)  08/16/19 195 lb 6.4 oz (88.6 kg)      ECOG FS:1 - Symptomatic but completely ambulatory  Sclerae unicteric, EOMs intact Wearing a mask No cervical or supraclavicular adenopathy Lungs no rales or rhonchi Heart regular rate and rhythm Abd soft, nontender, positive bowel sounds MSK no focal spinal tenderness, no upper extremity lymphedema Neuro: nonfocal, well oriented, labile affect Breasts: The right breast is benign.  The left breast is status post lumpectomy and a second surgery for margin clearance.  The overall cosmetic result is good.  There is still significant ecchymosis in the breast.  Both axillae are benign.   LAB RESULTS:  CMP     Component Value Date/Time   NA 141 09/05/2019 0848   K 4.0 09/05/2019 0848   CL 107 09/05/2019 0848   CO2 26 09/05/2019 0848   GLUCOSE 106 (H) 09/05/2019 0848   BUN 16 09/05/2019 0848   CREATININE 0.86 09/05/2019 0848   CREATININE 1.11 (H) 08/07/2019 1518   CREATININE 0.82 05/06/2019 1455   CALCIUM 8.7 (L) 09/05/2019 0848   PROT 7.0 08/07/2019 1518   ALBUMIN 3.9 08/07/2019 1518   AST 17 08/07/2019 1518   ALT 18 08/07/2019 1518   ALKPHOS 78 08/07/2019 1518   BILITOT 0.4 08/07/2019 1518   GFRNONAA >60 09/05/2019 0848   GFRNONAA 50 (L) 08/07/2019 1518   GFRNONAA 72 05/06/2019 1455   GFRAA >60 09/05/2019 0848   GFRAA 58 (L) 08/07/2019 1518   GFRAA 83 05/06/2019 1455    No results found for: TOTALPROTELP, ALBUMINELP, A1GS, A2GS, BETS, BETA2SER, GAMS, MSPIKE, SPEI  Lab Results  Component Value Date   WBC 5.8 09/05/2019   NEUTROABS 4.3 08/07/2019   HGB 12.7 09/05/2019   HCT 39.7 09/05/2019   MCV 99.3 09/05/2019   PLT 201  09/05/2019    No results found for: LABCA2  No components found for: [id number]  No results for input(s): INR in the last 168 hours.  No results found for: LABCA2  No results found for: OYD741  No results found for: OIN867  No results found for: EHM094  No  results found for: CA2729  No components found for: HGQUANT  No results found for: CEA1 / No results found for: CEA1   No results found for: AFPTUMOR  No results found for: CHROMOGRNA  No results found for: KPAFRELGTCHN, LAMBDASER, KAPLAMBRATIO (kappa/lambda light chains)  No results found for: HGBA, HGBA2QUANT, HGBFQUANT, HGBSQUAN (Hemoglobinopathy evaluation)   No results found for: LDH  Lab Results  Component Value Date   IRON 114 04/05/2017   TIBC 332 04/05/2017   IRONPCTSAT 34 04/05/2017   (Iron and TIBC)  Lab Results  Component Value Date   FERRITIN 166 04/04/2016    Urinalysis    Component Value Date/Time   COLORURINE YELLOW 02/20/2019 1332   APPEARANCEUR HAZY (A) 02/20/2019 1332   LABSPEC >1.030 (H) 02/20/2019 1332   PHURINE 5.5 02/20/2019 1332   GLUCOSEU NEGATIVE 02/20/2019 1332   HGBUR NEGATIVE 02/20/2019 1332   BILIRUBINUR NEGATIVE 02/20/2019 1332   KETONESUR NEGATIVE 02/20/2019 1332   PROTEINUR NEGATIVE 02/20/2019 1332   UROBILINOGEN 0.2 12/31/2012 1151   NITRITE NEGATIVE 02/20/2019 1332   LEUKOCYTESUR NEGATIVE 02/20/2019 1332    STUDIES: No results found.   ELIGIBLE FOR AVAILABLE RESEARCH PROTOCOL: AET  ASSESSMENT: 74 y.o. Kathy Cook woman status post left lumpectomy 07/26/2019 for ductal carcinoma in situ, arising in a phyllodes tumor [.Grading of the lesion is difficult since it has some features of a benign lesion (i.e., <5 mitoses/10 HPF) and some features of a borderline lesion (particularly the irregular border)]   (a) the ductal carcinoma in situ is low-grade, does not involve the margins, and is strongly estrogen and progesterone receptor positive  (b) margins are broadly  positive for the fibroepithelial lesion  (c) additional surgery 09/05/2019 for margin clearance found no residual phyllodes tumor or DCIS  (1) adjuvant radiation 10/14/2019-11/11/2019  (2) antiestrogens to follow as tolerated     PLAN: Jisella is very uncomfortable, with multiple pains, from her breast, from her knee, and from her plantar fasciitis.  She is taking Tylenol without much success and is not extending her left arm much because of the discomfort.  She is very frustrated by all this.  Her radiation had to be postponed but it is now scheduled for next week.  I think as far as the pain is concerned she could try Tylenol 500 mg together with Aleve 220 mg, up to 3 times daily as needed.  That would allow her to move a little bit more freely.  We talked about the plantar fasciitis and some exercises she can do.  And we talked about the knee pain and I suggested water aerobics and water exercises if she joined the [facility].  I think if we start a very low-dose of venlafaxine to serve as a pain adjuvant that will also be of help  Tentatively she will see me again in about 3 months.  At that time we will consider antiestrogens  Total encounter time 30 minutes.Sarajane Jews C. Jazariah Teall, MD 10/07/2019 11:13 AM Medical Oncology and Hematology [facility] [place], [place] [address] Tel. [phone]    Fax. [phone]   This document serves as a record of services personally performed by Lurline Del, MD. It was created on his behalf by Wilburn Mylar, a trained medical scribe. The creation of this record is based on the scribe's personal observations and the provider's statements to them.   I, Lurline Del MD, have reviewed the above documentation for accuracy and completeness, and I agree with the  above.   *Total Encounter Time as defined by the Centers for Medicare and Medicaid Services includes, in addition to the face-to-face time of a patient visit  (documented in the note above) non-face-to-face time: obtaining and reviewing outside history, ordering and reviewing medications, tests or procedures, care coordination (communications with other health care professionals or caregivers) and documentation in the medical record.

## 2019-10-07 ENCOUNTER — Telehealth: Payer: Self-pay | Admitting: Oncology

## 2019-10-07 ENCOUNTER — Other Ambulatory Visit: Payer: Self-pay

## 2019-10-07 ENCOUNTER — Inpatient Hospital Stay: Payer: Medicare Other | Attending: Oncology | Admitting: Oncology

## 2019-10-07 VITALS — BP 144/71 | HR 70 | Temp 97.9°F | Resp 20 | Ht 62.5 in

## 2019-10-07 DIAGNOSIS — Z85828 Personal history of other malignant neoplasm of skin: Secondary | ICD-10-CM | POA: Insufficient documentation

## 2019-10-07 DIAGNOSIS — Z87891 Personal history of nicotine dependence: Secondary | ICD-10-CM | POA: Diagnosis not present

## 2019-10-07 DIAGNOSIS — D4860 Neoplasm of uncertain behavior of unspecified breast: Secondary | ICD-10-CM

## 2019-10-07 DIAGNOSIS — D0512 Intraductal carcinoma in situ of left breast: Secondary | ICD-10-CM

## 2019-10-07 DIAGNOSIS — E559 Vitamin D deficiency, unspecified: Secondary | ICD-10-CM

## 2019-10-07 DIAGNOSIS — C50912 Malignant neoplasm of unspecified site of left female breast: Secondary | ICD-10-CM | POA: Diagnosis not present

## 2019-10-07 DIAGNOSIS — Z170 Estrogen receptor positive status [ER+]: Secondary | ICD-10-CM | POA: Insufficient documentation

## 2019-10-07 MED ORDER — VENLAFAXINE HCL ER 37.5 MG PO CP24
37.5000 mg | ORAL_CAPSULE | Freq: Every day | ORAL | 6 refills | Status: DC
Start: 2019-10-07 — End: 2020-06-17

## 2019-10-07 NOTE — Telephone Encounter (Signed)
Scheduled appts per 8/9 los. Gave pt a print out of AVS.  

## 2019-10-14 ENCOUNTER — Other Ambulatory Visit: Payer: Self-pay

## 2019-10-14 ENCOUNTER — Ambulatory Visit
Admission: RE | Admit: 2019-10-14 | Discharge: 2019-10-14 | Disposition: A | Discharge status: Home / Self Care | Payer: Medicare Other | Source: Ambulatory Visit | Attending: Radiation Oncology | Admitting: Radiation Oncology

## 2019-10-14 DIAGNOSIS — D0512 Intraductal carcinoma in situ of left breast: Secondary | ICD-10-CM | POA: Diagnosis not present

## 2019-10-14 DIAGNOSIS — Z510 Encounter for antineoplastic radiation therapy: Secondary | ICD-10-CM | POA: Diagnosis not present

## 2019-10-14 MED ORDER — RADIAPLEXRX EX GEL: Freq: Once | CUTANEOUS | Status: AC

## 2019-10-14 MED ORDER — ALRA NON-METALLIC DEODORANT (RAD-ONC)
1.0000 "application " | Freq: Once | TOPICAL | Status: AC
  Administered 2019-10-14: 1 via TOPICAL

## 2019-10-14 NOTE — Progress Notes (Signed)

## 2019-10-15 ENCOUNTER — Other Ambulatory Visit: Payer: Self-pay

## 2019-10-15 ENCOUNTER — Ambulatory Visit
Admission: RE | Admit: 2019-10-15 | Discharge: 2019-10-15 | Disposition: A | Discharge status: Home / Self Care | Payer: Medicare Other | Source: Ambulatory Visit | Attending: Radiation Oncology | Admitting: Radiation Oncology

## 2019-10-15 DIAGNOSIS — D0512 Intraductal carcinoma in situ of left breast: Secondary | ICD-10-CM | POA: Diagnosis not present

## 2019-10-15 DIAGNOSIS — Z510 Encounter for antineoplastic radiation therapy: Secondary | ICD-10-CM | POA: Diagnosis not present

## 2019-10-16 ENCOUNTER — Other Ambulatory Visit: Payer: Self-pay

## 2019-10-16 ENCOUNTER — Ambulatory Visit
Admission: RE | Admit: 2019-10-16 | Discharge: 2019-10-16 | Disposition: A | Discharge status: Home / Self Care | Payer: Medicare Other | Source: Ambulatory Visit | Attending: Radiation Oncology | Admitting: Radiation Oncology

## 2019-10-16 DIAGNOSIS — Z510 Encounter for antineoplastic radiation therapy: Secondary | ICD-10-CM | POA: Diagnosis not present

## 2019-10-16 DIAGNOSIS — D0512 Intraductal carcinoma in situ of left breast: Secondary | ICD-10-CM | POA: Diagnosis not present

## 2019-10-17 ENCOUNTER — Other Ambulatory Visit: Payer: Self-pay

## 2019-10-17 ENCOUNTER — Ambulatory Visit
Admission: RE | Admit: 2019-10-17 | Discharge: 2019-10-17 | Disposition: A | Discharge status: Home / Self Care | Payer: Medicare Other | Source: Ambulatory Visit | Attending: Radiation Oncology | Admitting: Radiation Oncology

## 2019-10-17 DIAGNOSIS — Z510 Encounter for antineoplastic radiation therapy: Secondary | ICD-10-CM | POA: Diagnosis not present

## 2019-10-17 DIAGNOSIS — D0512 Intraductal carcinoma in situ of left breast: Secondary | ICD-10-CM | POA: Diagnosis not present

## 2019-10-18 ENCOUNTER — Other Ambulatory Visit: Payer: Self-pay

## 2019-10-18 ENCOUNTER — Ambulatory Visit
Admission: RE | Admit: 2019-10-18 | Discharge: 2019-10-18 | Disposition: A | Discharge status: Home / Self Care | Payer: Medicare Other | Source: Ambulatory Visit | Attending: Radiation Oncology | Admitting: Radiation Oncology

## 2019-10-18 DIAGNOSIS — Z510 Encounter for antineoplastic radiation therapy: Secondary | ICD-10-CM | POA: Diagnosis not present

## 2019-10-18 DIAGNOSIS — D0512 Intraductal carcinoma in situ of left breast: Secondary | ICD-10-CM | POA: Diagnosis not present

## 2019-10-21 ENCOUNTER — Ambulatory Visit
Admission: RE | Admit: 2019-10-21 | Discharge: 2019-10-21 | Disposition: A | Discharge status: Home / Self Care | Payer: Medicare Other | Source: Ambulatory Visit | Attending: Radiation Oncology | Admitting: Radiation Oncology

## 2019-10-21 ENCOUNTER — Other Ambulatory Visit: Payer: Self-pay

## 2019-10-21 DIAGNOSIS — D0512 Intraductal carcinoma in situ of left breast: Secondary | ICD-10-CM | POA: Diagnosis not present

## 2019-10-21 DIAGNOSIS — Z510 Encounter for antineoplastic radiation therapy: Secondary | ICD-10-CM | POA: Diagnosis not present

## 2019-10-22 ENCOUNTER — Ambulatory Visit
Admission: RE | Admit: 2019-10-22 | Discharge: 2019-10-22 | Disposition: A | Discharge status: Home / Self Care | Payer: Medicare Other | Source: Ambulatory Visit | Attending: Radiation Oncology | Admitting: Radiation Oncology

## 2019-10-22 ENCOUNTER — Other Ambulatory Visit: Payer: Self-pay

## 2019-10-22 DIAGNOSIS — D0512 Intraductal carcinoma in situ of left breast: Secondary | ICD-10-CM | POA: Diagnosis not present

## 2019-10-22 DIAGNOSIS — Z510 Encounter for antineoplastic radiation therapy: Secondary | ICD-10-CM | POA: Diagnosis not present

## 2019-10-23 ENCOUNTER — Ambulatory Visit
Admission: RE | Admit: 2019-10-23 | Discharge: 2019-10-23 | Disposition: A | Discharge status: Home / Self Care | Payer: Medicare Other | Source: Ambulatory Visit | Attending: Radiation Oncology | Admitting: Radiation Oncology

## 2019-10-23 ENCOUNTER — Other Ambulatory Visit: Payer: Self-pay

## 2019-10-23 DIAGNOSIS — D0512 Intraductal carcinoma in situ of left breast: Secondary | ICD-10-CM | POA: Diagnosis not present

## 2019-10-23 DIAGNOSIS — Z510 Encounter for antineoplastic radiation therapy: Secondary | ICD-10-CM | POA: Diagnosis not present

## 2019-10-24 ENCOUNTER — Ambulatory Visit
Admission: RE | Admit: 2019-10-24 | Discharge: 2019-10-24 | Disposition: A | Discharge status: Home / Self Care | Payer: Medicare Other | Source: Ambulatory Visit | Attending: Radiation Oncology | Admitting: Radiation Oncology

## 2019-10-24 ENCOUNTER — Other Ambulatory Visit: Payer: Self-pay

## 2019-10-24 DIAGNOSIS — Z510 Encounter for antineoplastic radiation therapy: Secondary | ICD-10-CM | POA: Diagnosis not present

## 2019-10-24 DIAGNOSIS — D0512 Intraductal carcinoma in situ of left breast: Secondary | ICD-10-CM | POA: Diagnosis not present

## 2019-10-25 ENCOUNTER — Ambulatory Visit
Admission: RE | Admit: 2019-10-25 | Discharge: 2019-10-25 | Disposition: A | Discharge status: Home / Self Care | Payer: Medicare Other | Source: Ambulatory Visit | Attending: Radiation Oncology | Admitting: Radiation Oncology

## 2019-10-25 ENCOUNTER — Other Ambulatory Visit: Payer: Self-pay

## 2019-10-25 DIAGNOSIS — D0512 Intraductal carcinoma in situ of left breast: Secondary | ICD-10-CM | POA: Diagnosis not present

## 2019-10-25 DIAGNOSIS — Z510 Encounter for antineoplastic radiation therapy: Secondary | ICD-10-CM | POA: Diagnosis not present

## 2019-10-28 ENCOUNTER — Ambulatory Visit: Payer: Medicare Other | Admitting: Radiation Oncology

## 2019-10-28 ENCOUNTER — Ambulatory Visit
Admission: RE | Admit: 2019-10-28 | Discharge: 2019-10-28 | Disposition: A | Discharge status: Home / Self Care | Payer: Medicare Other | Source: Ambulatory Visit | Attending: Radiation Oncology | Admitting: Radiation Oncology

## 2019-10-28 ENCOUNTER — Other Ambulatory Visit: Payer: Self-pay

## 2019-10-28 DIAGNOSIS — Z510 Encounter for antineoplastic radiation therapy: Secondary | ICD-10-CM | POA: Diagnosis not present

## 2019-10-28 DIAGNOSIS — D0512 Intraductal carcinoma in situ of left breast: Secondary | ICD-10-CM | POA: Diagnosis not present

## 2019-10-29 ENCOUNTER — Ambulatory Visit
Admission: RE | Admit: 2019-10-29 | Discharge: 2019-10-29 | Disposition: A | Discharge status: Home / Self Care | Payer: Medicare Other | Source: Ambulatory Visit | Attending: Radiation Oncology | Admitting: Radiation Oncology

## 2019-10-29 ENCOUNTER — Other Ambulatory Visit: Payer: Self-pay

## 2019-10-29 DIAGNOSIS — D0512 Intraductal carcinoma in situ of left breast: Secondary | ICD-10-CM | POA: Diagnosis not present

## 2019-10-29 DIAGNOSIS — Z510 Encounter for antineoplastic radiation therapy: Secondary | ICD-10-CM | POA: Diagnosis not present

## 2019-10-30 ENCOUNTER — Ambulatory Visit
Admission: RE | Admit: 2019-10-30 | Discharge: 2019-10-30 | Disposition: A | Discharge status: Home / Self Care | Payer: Medicare Other | Source: Ambulatory Visit | Attending: Radiation Oncology | Admitting: Radiation Oncology

## 2019-10-30 ENCOUNTER — Ambulatory Visit: Payer: Medicare Other | Admitting: Radiation Oncology

## 2019-10-30 ENCOUNTER — Other Ambulatory Visit: Payer: Self-pay

## 2019-10-30 DIAGNOSIS — Z510 Encounter for antineoplastic radiation therapy: Secondary | ICD-10-CM | POA: Insufficient documentation

## 2019-10-30 DIAGNOSIS — D0512 Intraductal carcinoma in situ of left breast: Secondary | ICD-10-CM | POA: Insufficient documentation

## 2019-10-31 ENCOUNTER — Ambulatory Visit
Admission: RE | Admit: 2019-10-31 | Discharge: 2019-10-31 | Disposition: A | Discharge status: Home / Self Care | Payer: Medicare Other | Source: Ambulatory Visit | Attending: Radiation Oncology | Admitting: Radiation Oncology

## 2019-10-31 ENCOUNTER — Other Ambulatory Visit: Payer: Self-pay

## 2019-10-31 DIAGNOSIS — Z510 Encounter for antineoplastic radiation therapy: Secondary | ICD-10-CM | POA: Diagnosis not present

## 2019-10-31 DIAGNOSIS — D0512 Intraductal carcinoma in situ of left breast: Secondary | ICD-10-CM | POA: Diagnosis not present

## 2019-11-01 ENCOUNTER — Other Ambulatory Visit: Payer: Self-pay

## 2019-11-01 ENCOUNTER — Ambulatory Visit
Admission: RE | Admit: 2019-11-01 | Discharge: 2019-11-01 | Disposition: A | Discharge status: Home / Self Care | Payer: Medicare Other | Source: Ambulatory Visit | Attending: Radiation Oncology | Admitting: Radiation Oncology

## 2019-11-01 DIAGNOSIS — D0512 Intraductal carcinoma in situ of left breast: Secondary | ICD-10-CM | POA: Diagnosis not present

## 2019-11-01 DIAGNOSIS — Z510 Encounter for antineoplastic radiation therapy: Secondary | ICD-10-CM | POA: Diagnosis not present

## 2019-11-05 ENCOUNTER — Other Ambulatory Visit: Payer: Self-pay

## 2019-11-05 ENCOUNTER — Ambulatory Visit
Admission: RE | Admit: 2019-11-05 | Discharge: 2019-11-05 | Disposition: A | Discharge status: Home / Self Care | Payer: Medicare Other | Source: Ambulatory Visit | Attending: Radiation Oncology | Admitting: Radiation Oncology

## 2019-11-05 DIAGNOSIS — D0512 Intraductal carcinoma in situ of left breast: Secondary | ICD-10-CM | POA: Diagnosis not present

## 2019-11-05 DIAGNOSIS — Z510 Encounter for antineoplastic radiation therapy: Secondary | ICD-10-CM | POA: Diagnosis not present

## 2019-11-06 ENCOUNTER — Other Ambulatory Visit: Payer: Self-pay

## 2019-11-06 ENCOUNTER — Ambulatory Visit
Admission: RE | Admit: 2019-11-06 | Discharge: 2019-11-06 | Disposition: A | Discharge status: Home / Self Care | Payer: Medicare Other | Source: Ambulatory Visit | Attending: Radiation Oncology | Admitting: Radiation Oncology

## 2019-11-06 DIAGNOSIS — D0512 Intraductal carcinoma in situ of left breast: Secondary | ICD-10-CM | POA: Diagnosis not present

## 2019-11-06 DIAGNOSIS — Z510 Encounter for antineoplastic radiation therapy: Secondary | ICD-10-CM | POA: Diagnosis not present

## 2019-11-07 ENCOUNTER — Other Ambulatory Visit: Payer: Self-pay

## 2019-11-07 ENCOUNTER — Ambulatory Visit
Admission: RE | Admit: 2019-11-07 | Discharge: 2019-11-07 | Disposition: A | Discharge status: Home / Self Care | Payer: Medicare Other | Source: Ambulatory Visit | Attending: Radiation Oncology | Admitting: Radiation Oncology

## 2019-11-07 DIAGNOSIS — D0512 Intraductal carcinoma in situ of left breast: Secondary | ICD-10-CM | POA: Diagnosis not present

## 2019-11-07 DIAGNOSIS — Z510 Encounter for antineoplastic radiation therapy: Secondary | ICD-10-CM | POA: Diagnosis not present

## 2019-11-08 ENCOUNTER — Other Ambulatory Visit: Payer: Self-pay

## 2019-11-08 ENCOUNTER — Ambulatory Visit
Admission: RE | Admit: 2019-11-08 | Discharge: 2019-11-08 | Disposition: A | Discharge status: Home / Self Care | Payer: Medicare Other | Source: Ambulatory Visit | Attending: Radiation Oncology | Admitting: Radiation Oncology

## 2019-11-08 DIAGNOSIS — Z510 Encounter for antineoplastic radiation therapy: Secondary | ICD-10-CM | POA: Diagnosis not present

## 2019-11-08 DIAGNOSIS — D0512 Intraductal carcinoma in situ of left breast: Secondary | ICD-10-CM | POA: Diagnosis not present

## 2019-11-11 ENCOUNTER — Ambulatory Visit
Admission: RE | Admit: 2019-11-11 | Discharge: 2019-11-11 | Disposition: A | Discharge status: Home / Self Care | Payer: Medicare Other | Source: Ambulatory Visit | Attending: Radiation Oncology | Admitting: Radiation Oncology

## 2019-11-11 ENCOUNTER — Other Ambulatory Visit: Payer: Self-pay

## 2019-11-11 ENCOUNTER — Encounter: Payer: Self-pay | Admitting: *Deleted

## 2019-11-11 ENCOUNTER — Encounter: Payer: Self-pay | Admitting: Radiation Oncology

## 2019-11-11 DIAGNOSIS — Z510 Encounter for antineoplastic radiation therapy: Secondary | ICD-10-CM | POA: Diagnosis not present

## 2019-11-11 DIAGNOSIS — D0512 Intraductal carcinoma in situ of left breast: Secondary | ICD-10-CM | POA: Diagnosis not present

## 2019-11-18 ENCOUNTER — Other Ambulatory Visit: Payer: Self-pay

## 2019-11-18 ENCOUNTER — Encounter: Payer: Self-pay | Admitting: Physician Assistant

## 2019-11-18 ENCOUNTER — Ambulatory Visit (INDEPENDENT_AMBULATORY_CARE_PROVIDER_SITE_OTHER): Payer: Medicare Other | Admitting: Physician Assistant

## 2019-11-18 VITALS — BP 126/78 | HR 95 | Temp 97.3°F | Ht 62.5 in | Wt 197.4 lb

## 2019-11-18 DIAGNOSIS — R0989 Other specified symptoms and signs involving the circulatory and respiratory systems: Secondary | ICD-10-CM | POA: Diagnosis not present

## 2019-11-18 DIAGNOSIS — E785 Hyperlipidemia, unspecified: Secondary | ICD-10-CM

## 2019-11-18 DIAGNOSIS — Z79899 Other long term (current) drug therapy: Secondary | ICD-10-CM

## 2019-11-18 DIAGNOSIS — Z6832 Body mass index (BMI) 32.0-32.9, adult: Secondary | ICD-10-CM

## 2019-11-18 DIAGNOSIS — Z0000 Encounter for general adult medical examination without abnormal findings: Secondary | ICD-10-CM

## 2019-11-18 DIAGNOSIS — F325 Major depressive disorder, single episode, in full remission: Secondary | ICD-10-CM

## 2019-11-18 DIAGNOSIS — R6889 Other general symptoms and signs: Secondary | ICD-10-CM | POA: Diagnosis not present

## 2019-11-18 DIAGNOSIS — D0512 Intraductal carcinoma in situ of left breast: Secondary | ICD-10-CM | POA: Diagnosis not present

## 2019-11-18 DIAGNOSIS — M8949 Other hypertrophic osteoarthropathy, multiple sites: Secondary | ICD-10-CM

## 2019-11-18 DIAGNOSIS — Z0001 Encounter for general adult medical examination with abnormal findings: Secondary | ICD-10-CM | POA: Diagnosis not present

## 2019-11-18 DIAGNOSIS — B009 Herpesviral infection, unspecified: Secondary | ICD-10-CM

## 2019-11-18 DIAGNOSIS — E559 Vitamin D deficiency, unspecified: Secondary | ICD-10-CM

## 2019-11-18 DIAGNOSIS — L718 Other rosacea: Secondary | ICD-10-CM

## 2019-11-18 DIAGNOSIS — D4860 Neoplasm of uncertain behavior of unspecified breast: Secondary | ICD-10-CM | POA: Diagnosis not present

## 2019-11-18 DIAGNOSIS — F988 Other specified behavioral and emotional disorders with onset usually occurring in childhood and adolescence: Secondary | ICD-10-CM

## 2019-11-18 DIAGNOSIS — E6609 Other obesity due to excess calories: Secondary | ICD-10-CM

## 2019-11-18 DIAGNOSIS — M8580 Other specified disorders of bone density and structure, unspecified site: Secondary | ICD-10-CM

## 2019-11-18 DIAGNOSIS — M159 Polyosteoarthritis, unspecified: Secondary | ICD-10-CM

## 2019-11-18 DIAGNOSIS — K219 Gastro-esophageal reflux disease without esophagitis: Secondary | ICD-10-CM

## 2019-11-18 DIAGNOSIS — D649 Anemia, unspecified: Secondary | ICD-10-CM

## 2019-11-18 DIAGNOSIS — T7840XD Allergy, unspecified, subsequent encounter: Secondary | ICD-10-CM

## 2019-11-18 DIAGNOSIS — M1711 Unilateral primary osteoarthritis, right knee: Secondary | ICD-10-CM

## 2019-11-18 MED ORDER — AMPHETAMINE-DEXTROAMPHETAMINE 30 MG PO TABS: ORAL_TABLET | ORAL | 0 refills | Status: DC

## 2019-11-18 NOTE — Patient Instructions (Signed)
I'm going to place an order for a cardiac calcium score, this will help [place] quantify how much calcification if any you have in your coronary arteries and can help [place] decide if we need to do aggressive medical management like cholesterol medication.  It will be done at [facility] on [address] in [place] It will be 99 dollars self pay.    Your LDL could improve, ideally we want it under a 100.  Your LDL is the bad cholesterol that can lead to heart attack and stroke. To lower your number you can decrease your fatty foods, red meat, cheese, milk and increase fiber like whole grains and veggies. You can also add a fiber supplement like Citracel or Benefiber, these do not cause gas and bloating and are safe to use. Especially if you have a strong family history of heart disease or stroke or you have evidence of plaque on any imaging like a chest xray, we may discuss at your next office visit putting you on a medication to get your number below 100.

## 2019-11-18 NOTE — Progress Notes (Signed)
MEDICARE WELLNESS and follow up MEDICARE visit.  Assessment:   Encounter for Medicare annual wellness exam 1 year  Depression, major, in remission ([place]) - continue medications, stress management techniques discussed, increase water, good sleep hygiene discussed, increase exercise, and increase veggies.   Phyllodes tumor of breast S/p lumpectomy with radiation  Ductal carcinoma in situ (DCIS) of left breast S/p lumpectomy with radiation  Labile hypertension - continue medications, DASH diet, exercise and monitor at home. Call if greater than 130/80.   Hyperlipidemia, unspecified hyperlipidemia type check lipids- not tolerate of statin/zetia Will get cardiac calcium score if elevated will refer to lipid clinic decrease fatty foods increase activity.   Medication management  Vitamin D deficiency Continue supplement  Anemia, unspecified type - monitor, continue iron supp with Vitamin C and increase green leafy veggies  Osteopenia, unspecified location Monitor  Ocular rosacea Monitor  Class 1 obesity due to excess calories with serious comorbidity and body mass index (BMI) of 32.0 to 32.9 in adult - follow up 3 months for progress monitoring - increase veggies, decrease carbs - long discussion about weight loss, diet, and exercise  Attention deficit disorder, unspecified hyperactivity presence  Gastroesophageal reflux disease, unspecified whether esophagitis present Continue PPI/H2 blocker, diet discussed  Allergy, subsequent encounter Allergic rhinitis - Allegra OTC, increase H20, allergy hygiene explained.  HSV infection Monitor  Primary osteoarthritis involving multiple joints Continue follow up ortho  Arthritis of right knee Continue follow up ortho   Future Appointments  Date Time Provider [place]  12/13/2019 10:00 AM Eppie Gibson, MD [facility] None  01/15/2020 11:00 AM Magrinat, Virgie Dad, MD [facility] None  05/07/2020  3:00 PM  Garnet Sierras, NP [facility] None    Medicare Attestation I have personally reviewed: The patient's medical and social history Their use of alcohol, tobacco or illicit drugs Their current medications and supplements The patient's functional ability including ADLs,fall risks, home safety risks, cognitive, and hearing and visual impairment Diet and physical activities Evidence for depression or mood disorders  The patient's weight, height, BMI, and visual acuity have been recorded in the chart.  I have made referrals, counseling, and provided education to the patient based on review of the above and I have provided the patient with a written personalized care plan for preventive services.    MEDICARE WELLNESS OBJECTIVES: Physical activity: Current Exercise Habits: The patient does not participate in regular exercise at present, Exercise limited by: orthopedic condition(s) Cardiac risk factors: Cardiac Risk Factors include: advanced age (>89men, >99 women);dyslipidemia;hypertension;obesity (BMI >30kg/m2);sedentary lifestyle Depression/mood screen:   Depression screen [facility] 2/9 11/18/2019  Decreased Interest 0  Down, Depressed, Hopeless 0  PHQ - 2 Score 0    ADLs:  In your present state of health, do you have any difficulty performing the following activities: 11/18/2019 09/05/2019  Hearing? N N  Vision? N N  Difficulty concentrating or making decisions? N N  Walking or climbing stairs? N Y  Comment - s/p left knee replacement  Dressing or bathing? N N  Doing errands, shopping? N -  Some recent data might be hidden     Cognitive Testing  Alert? Yes  Normal Appearance?Yes  Oriented to person? Yes  Place? Yes   Time? Yes  Recall of three objects?  Yes  Can perform simple calculations? Yes  Displays appropriate judgment?Yes  Can read the correct time from a watch face?Yes  EOL planning: Does Patient Have a Medical Advance Directive?: Yes Type of Advance Directive: Healthcare Power  of [place], Living  will Copy of [place] in Chart?: No - copy requested    Subjective:   Kathy Cook is a 74 y.o. female who presents for Medicare wellness and 3 month follow up on hypertension, hyperlipidemia, vitamin D def.   BMI is Body mass index is 35.53 kg/m., she is working on diet and exercise. Wt Readings from Last 3 Encounters:  11/18/19 197 lb 6.4 oz (89.5 kg)  10/01/19 201 lb 12.8 oz (91.5 kg)  09/05/19 190 lb (86.2 kg)   She is S/p lumpectomy with radiation on her left breast, no longer on hormones due to DC in situ, started on effexor 37.5mg  by Dr. Griffith Citron.   She does well on the Adderall, helps with concentration however has not been taking.  Has HSV 1 and has famcyclovir for occ out break.    Her blood pressure has been controlled at home, today their BP is BP: 126/78 She does workout, walks. She denies chest pain, shortness of breath, dizziness.  She is not on cholesterol medication, can not tolerate statins and could not tolerate zetia due to diarrhea. No family history.  Her cholesterol is not at goal. The cholesterol last visit was:   Lab Results  Component Value Date   CHOL 329 (H) 08/16/2019   HDL 88 08/16/2019   LDLCALC 205 (H) 08/16/2019   TRIG 185 (H) 08/16/2019   CHOLHDL 3.7 08/16/2019   Last A1C in the office was:  Lab Results  Component Value Date   HGBA1C 5.5 08/16/2019   Patient is on Vitamin D supplement. Lab Results  Component Value Date   VD25OH 51 08/16/2019    Names of Other Physician/Practitioners you currently use: 1. [facility]- here for primary care 2. [place], eye doctor, 04/2019 3.  Dr. Johnnye Sima, dentist, last visit q 6 months Patient Care Team: Unk Pinto, MD as PCP - General (Internal Medicine) Dian Queen, MD as Consulting Physician (Obstetrics and Gynecology) Clent Jacks, MD as Consulting Physician (Ophthalmology) Lindwood Coke, MD as Consulting  Physician (Dermatology) Magrinat, Virgie Dad, MD as Consulting Physician (Oncology) Coralie Keens, MD as Consulting Physician (General Surgery) Eppie Gibson, MD as Attending Physician (Radiation Oncology) [name], MD as Consulting Physician (Orthopedic Surgery) Mauro Kaufmann, RN as Oncology Nurse Navigator Rockwell Germany, RN as Oncology Nurse Navigator  Medication Review    Current Outpatient Medications (Respiratory):  .  loratadine (CLARITIN) 10 MG tablet, Take 10 mg by mouth daily as needed for allergies.  Current Outpatient Medications (Analgesics):  .  acetaminophen (TYLENOL) 500 MG tablet, Take 500 mg by mouth every 6 (six) hours as needed for moderate pain or headache. Marland Kitchen  aspirin EC 81 MG tablet, Take 81 mg by mouth daily. Swallow whole.   Current Outpatient Medications (Other):  .  amphetamine-dextroamphetamine (ADDERALL) 30 MG tablet, Take 1/2 to 1 tablet 2 x /day as needed for Focus & Concentration (Patient taking differently: Take 7.5 mg by mouth daily as needed (Focus & Concentration). ) .  Cholecalciferol (DIALYVITE VITAMIN D 5000) 125 MCG (5000 UT) capsule, Take 10,000 Units by mouth daily. Marland Kitchen  L-Tyrosine 500 MG CAPS, Take 500 mg by mouth daily. .  Olopatadine HCl (PATADAY OP), Place 1 drop into both eyes daily as needed (dry eyes). Marland Kitchen  OVER THE COUNTER MEDICATION, Take 550 mg by mouth daily. [place] Tail Mushroom otc supplement .  OVER THE COUNTER MEDICATION, Apply 1 application topically daily as needed (swelling / pain). Hemp cream .  venlafaxine XR (EFFEXOR-XR) 37.5 MG 24 hr capsule, Take 1 capsule (37.5 mg total) by mouth daily with breakfast. .  Vitamin E Skin OIL, Apply 1 application topically daily as needed (scarring).  Current Problems (verified) Patient Active Problem List   Diagnosis Date Noted  . Phyllodes tumor of breast 08/07/2019  . Ductal carcinoma in situ (DCIS) of left breast 08/06/2019  . Status post total hip replacement, right  03/05/2019  . Arthritis of right knee 03/04/2019  . Unilateral primary osteoarthritis, right knee 02/05/2019  . Knee locking, right 01/09/2019  . Metatarsal stress fracture, right, initial encounter 10/03/2016  . DJD (degenerative joint disease) 06/09/2016  . Labile hypertension 08/01/2014  . Vitamin D deficiency 08/01/2014  . Medication management 08/01/2014  . Obesity 07/04/2014  . Hyperlipemia 06/27/2013  . Attention deficit disorder (ADD)   . GERD (gastroesophageal reflux disease)   . Allergy   . Depression, major, in remission ([place])   . Anemia   . Ocular rosacea   . HSV infection   . Osteopenia     Screening Tests Immunization History  Administered Date(s) Administered  . DT (Pediatric) 01/14/2015  . Influenza, High Dose Seasonal PF 04/30/2018, 11/13/2018  . PFIZER SARS-COV-2 Vaccination 04/07/2019, 05/01/2019  . Pneumococcal Conjugate-13 03/24/2015  . Pneumococcal-Unspecified 12/31/2012  . Td 03/29/2004    Preventative care: Last colonoscopy: 06/2017 last colonoscopy per Dr. Collene Mares Last mammogram: 07/2018  at GYN  Last pap smear/pelvic exam: 2020 at GYN Dr. Tressia Danas DEXA 2019 getting next year at GYN  Prior vaccinations: TD or Tdap: 2016  Influenza: 2020 Pneumococcal: 2014 Prevnar 13: 2017 Shingles/Zostavax: DUE  Allergies Allergies  Allergen Reactions  . Zetia [Ezetimibe] Diarrhea    Patient states she had IBS, that took a year to resolve.  . Prednisone Nausea And Vomiting  . Statins     paralyzing  . Strattera [Atomoxetine Hcl]     Increased sadness    SURGICAL HISTORY She  has a past surgical history that includes Tonsillectomy and adenoidectomy; Skin biopsy; Total knee arthroplasty (Right, 03/04/2019); Breast lumpectomy (Left, 07/18/2019); Colonoscopy (2019); Upper gi endoscopy; and Re-excision of breast lumpectomy (Left, 09/05/2019). FAMILY HISTORY Her family history includes Depression in her mother; Diabetes in her father; Heart disease in her mother;  Hypertension in her mother; Macular degeneration in her mother. SOCIAL HISTORY She  reports that she quit smoking about 39 years ago. Her smoking use included cigarettes. She has a 7.50 pack-year smoking history. She has never used smokeless tobacco. She reports current alcohol use. She reports that she does not use drugs.  Review of Systems  Constitutional: Negative.   HENT: Negative.   Eyes: Negative.   Respiratory: Negative.   Cardiovascular: Negative.   Gastrointestinal: Negative.   Genitourinary: Negative.   Musculoskeletal: Negative.   Skin: Negative.   Neurological: Positive for tingling (back worse with stress). Negative for dizziness, tremors, sensory change, speech change, focal weakness, seizures, loss of consciousness and headaches.     Objective:   Blood pressure 126/78, pulse 95, temperature (!) 97.3 F (36.3 C), height 5' 2.5" (1.588 m), weight 197 lb 6.4 oz (89.5 kg), SpO2 95 %. Body mass index is 35.53 kg/m.  General appearance: alert, no distress, WD/WN,  female HEENT: normocephalic, sclerae anicteric, pupils dilated at this time due to recent eye exam TMs pearly, nares patent, no discharge or erythema, pharynx normal Oral cavity: MMM, no lesions Neck: supple, no lymphadenopathy, no thyromegaly, no masses Heart: RRR, normal S1, S2, no murmurs Lungs: CTA bilaterally,  no wheezes, rhonchi, or rales Abdomen: +bs, soft, non tender, non distended, no masses, no hepatomegaly, no splenomegaly Musculoskeletal: nontender, no swelling, no obvious deformity Extremities: no edema, no cyanosis, no clubbing Pulses: 2+ symmetric, upper and lower extremities, normal cap refill Neurological: alert, oriented x 3, CN2-12 intact, strength normal upper extremities and lower extremities, sensation normal throughout, DTRs 2+ throughout, no cerebellar signs, gait normal Psychiatric: normal affect, behavior normal, pleasant  Breast: Left breast with large 2-3 cm mobile non tender mass at  12 oclock.  Gyn: defer Rectal: defer    Vicie Mutters, Hershal Coria   11/18/2019

## 2019-11-19 LAB — COMPLETE METABOLIC PANEL WITH GFR
AG Ratio: 2.2 (calc) (ref 1.0–2.5)
ALT: 13 U/L (ref 6–29)
AST: 12 U/L (ref 10–35)
Albumin: 4.4 g/dL (ref 3.6–5.1)
Alkaline phosphatase (APISO): 58 U/L (ref 37–153)
BUN: 23 mg/dL (ref 7–25)
CO2: 30 mmol/L (ref 20–32)
Calcium: 9.8 mg/dL (ref 8.6–10.4)
Chloride: 104 mmol/L (ref 98–110)
Creat: 0.86 mg/dL (ref 0.60–0.93)
GFR, Est African American: 79 mL/min/{1.73_m2} (ref >=60)
GFR, Est Non African American: 68 mL/min/{1.73_m2} (ref >=60)
Globulin: 2 g/dL (calc) (ref 1.9–3.7)
Glucose, Bld: 102 mg/dL — ABNORMAL HIGH (ref 65–99)
Potassium: 4.4 mmol/L (ref 3.5–5.3)
Sodium: 141 mmol/L (ref 135–146)
Total Bilirubin: 0.4 mg/dL (ref 0.2–1.2)
Total Protein: 6.4 g/dL (ref 6.1–8.1)

## 2019-11-19 LAB — CBC WITH DIFFERENTIAL/PLATELET
Absolute Monocytes: 549 cells/uL (ref 200–950)
Basophils Absolute: 29 cells/uL (ref 0–200)
Basophils Relative: 0.6 %
Eosinophils Absolute: 289 cells/uL (ref 15–500)
Eosinophils Relative: 5.9 %
HCT: 41.8 % (ref 35.0–45.0)
Hemoglobin: 14.1 g/dL (ref 11.7–15.5)
Lymphs Abs: 657 cells/uL — ABNORMAL LOW (ref 850–3900)
MCH: 32.6 pg (ref 27.0–33.0)
MCHC: 33.7 g/dL (ref 32.0–36.0)
MCV: 96.8 fL (ref 80.0–100.0)
MPV: 10.3 fL (ref 7.5–12.5)
Monocytes Relative: 11.2 %
Neutro Abs: 3376 cells/uL (ref 1500–7800)
Neutrophils Relative %: 68.9 %
Platelets: 188 10*3/uL (ref 140–400)
RBC: 4.32 10*6/uL (ref 3.80–5.10)
RDW: 11.7 % (ref 11.0–15.0)
Total Lymphocyte: 13.4 %
WBC: 4.9 10*3/uL (ref 3.8–10.8)

## 2019-11-19 LAB — LIPID PANEL
Cholesterol: 332 mg/dL — ABNORMAL HIGH (ref <200)
HDL: 78 mg/dL (ref >=50)
LDL Cholesterol (Calc): 225 mg/dL (calc) — ABNORMAL HIGH
Non-HDL Cholesterol (Calc): 254 mg/dL (calc) — ABNORMAL HIGH (ref <130)
Total CHOL/HDL Ratio: 4.3 (calc) (ref <5.0)
Triglycerides: 137 mg/dL (ref <150)

## 2019-11-19 LAB — MAGNESIUM: Magnesium: 2.1 mg/dL (ref 1.5–2.5)

## 2019-11-19 LAB — TSH: TSH: 3.88 mIU/L (ref 0.40–4.50)

## 2019-11-20 LAB — LIPOPROTEIN A (LPA): Lipoprotein (a): <10 nmol/L (ref <75)

## 2019-12-04 MED ORDER — NITROFURANTOIN MONOHYD MACRO 100 MG PO CAPS: 100.0000 mg | ORAL_CAPSULE | Freq: Two times a day (BID) | ORAL | 0 refills | Status: DC

## 2019-12-04 NOTE — Telephone Encounter (Signed)
-----   Message from Kathy Cook, [place] sent at 12/04/2019 12:32 PM EDT ----- Regarding: POSS UTI Contact: [phone] Per PATIENT thinks she may have a UTI & wants medication sent to her pharmacy.   [facility]

## 2019-12-12 ENCOUNTER — Telehealth: Payer: Self-pay

## 2019-12-12 NOTE — Telephone Encounter (Signed)
I called the patient today about her upcoming follow-up appointment in radiation oncology.   Given the state of the COVID-19 pandemic, concerning case numbers in our community, and guidance from [facility], I offered a phone assessment with the patient to determine if coming to the clinic was necessary. She accepted.  I let the patient know that I had spoken with Dr. Isidore Moos, and she wanted them to know the importance of washing their hands for at least 20 seconds at a time, especially after going out in public, and before they eat.  Limit going out in public whenever possible. Do not touch your face, unless your hands are clean, such as when bathing. Get plenty of rest, eat well, and stay hydrated. Patient verbalized understanding and agreement  The patient denies any symptomatic concerns. She reports residual fatigue, but states she has restarted going to water aerobics to maintain physical activity. She denies any pain to her breast, but does report occasional tenderness on palpation. She denies any swelling to the breast or issues with range of motion to her left arm. Specifically, they report good healing of their skin in the radiation fields.  Skin is still slightly darker in treatment field, but otherwise intact.  I recommended that she continue skin care by applying oil or lotion with vitamin E to the skin in the radiation fields, BID, for 2 more months. Overall patient reports she feels well and is pleased with her recovery thus far.  Continue follow-up with medical oncology - follow-up is scheduled on 01/15/2020 with Dr. Lurline Del.  I explained that yearly mammograms are important for patients with intact breast tissue, and physical exams are important after mastectomy for patients that cannot undergo mammography. Patient verbalized understanding and agreement.  I encouraged her to call if she had further questions or concerns about her healing. Otherwise, she will follow-up PRN  in radiation oncology. Patient is pleased with this plan, and expressed appreciation of care from Dr. Isidore Moos and her team. We will cancel her upcoming follow-up to reduce the risk of COVID-19 transmission.

## 2019-12-13 ENCOUNTER — Ambulatory Visit: Payer: Self-pay | Admitting: Radiation Oncology

## 2019-12-25 ENCOUNTER — Ambulatory Visit: Payer: Self-pay

## 2019-12-25 ENCOUNTER — Ambulatory Visit: Payer: Medicare Other | Admitting: Family Medicine

## 2019-12-25 ENCOUNTER — Other Ambulatory Visit: Payer: Self-pay

## 2019-12-25 VITALS — BP 132/77 | Ht 62.0 in | Wt 192.0 lb

## 2019-12-25 DIAGNOSIS — M7661 Achilles tendinitis, right leg: Secondary | ICD-10-CM

## 2019-12-25 DIAGNOSIS — M7662 Achilles tendinitis, left leg: Secondary | ICD-10-CM | POA: Diagnosis not present

## 2019-12-25 DIAGNOSIS — M79671 Pain in right foot: Secondary | ICD-10-CM

## 2019-12-25 DIAGNOSIS — M79672 Pain in left foot: Secondary | ICD-10-CM

## 2019-12-25 DIAGNOSIS — M722 Plantar fascial fibromatosis: Secondary | ICD-10-CM

## 2019-12-25 NOTE — Patient Instructions (Signed)
You have plantar fasciitis and achilles tendinopathy. Take tylenol and/or aleve as needed for pain. Diclofenac (voltaren) gel up to 4 times a day. Plantar fascia stretch for 20-30 seconds (do 3 of these) in morning When tolerated, lowering/raise on a step exercises 3 x 10 once a day - this is very important for long term recovery.  Theraband strengthening once a day as well for your ankles. Ice heel for 15 minutes as needed. Avoid flat shoes/barefoot walking as much as possible. The inserts are very important - you can switch these in and out of different shoes. Arch straps have been shown to help with pain. Consider formal physical therapy. Follow up with me in 6 weeks. Consider custom orthotics if you do well with these inserts - if you want these, let Threasa Beards know when you call so she can block out enough time to make them.

## 2019-12-26 ENCOUNTER — Encounter: Payer: Self-pay | Admitting: Family Medicine

## 2019-12-26 NOTE — Progress Notes (Signed)
PCP: Unk Pinto, MD  Subjective:   HPI: Patient is a 74 y.o. female here for bilateral foot and ankle pain.  Patient was seen here several years ago with pain in both feet though reports current pain is a little different. Pain noted in both heels primarily both plantar and posteriorly. Reports having had a fracture on the lateral aspect of her left foot that has never felt right since the injury. Was told this had healed completely though. She has had a right TKR. She likes to swim about 3 days a week. Reports she cannot be on her feet for a length of time due to pain and also feels like there's a rock under her heels. She's tried different shoes, used inserts as well. Ones with very high arch support feel the best to her.  Past Medical History:  Diagnosis Date  . Allergy   . Anxiety   . Attention deficit disorder (ADD)   . Cancer [facility])    ? basal/squam cell on chest, and forehead  . Depression   . Dry eyes   . GERD (gastroesophageal reflux disease)    diet controlled, no meds  . HLD (hyperlipidemia)   . HSV infection   . Lumbar degenerative disc disease   . Ocular rosacea   . Osteopenia     Current Outpatient Medications on File Prior to Visit  Medication Sig Dispense Refill  . acetaminophen (TYLENOL) 500 MG tablet Take 500 mg by mouth every 6 (six) hours as needed for moderate pain or headache.    . amphetamine-dextroamphetamine (ADDERALL) 30 MG tablet Take 1/2 to 1 tablet 2 x /day as needed for Focus & Concentration 60 tablet 0  . aspirin EC 81 MG tablet Take 81 mg by mouth daily. Swallow whole.    . Cholecalciferol (DIALYVITE VITAMIN D 5000) 125 MCG (5000 UT) capsule Take 10,000 Units by mouth daily.    . Colesevelam HCl 3.75 g PACK colesevelam 3.75 gram oral powder packet  DISSOLVE 1 PACKET IN WATER OR JUICE AND DRINK DAILY.    Marland Kitchen L-Tyrosine 500 MG CAPS Take 500 mg by mouth daily.    Marland Kitchen loratadine (CLARITIN) 10 MG tablet Take 10 mg by mouth daily as needed for  allergies.    . Olopatadine HCl (PATADAY OP) Place 1 drop into both eyes daily as needed (dry eyes).    Marland Kitchen OVER THE COUNTER MEDICATION Take 550 mg by mouth daily. [place] Tail Mushroom otc supplement    . OVER THE COUNTER MEDICATION Apply 1 application topically daily as needed (swelling / pain). Hemp cream    . venlafaxine XR (EFFEXOR-XR) 37.5 MG 24 hr capsule Take 1 capsule (37.5 mg total) by mouth daily with breakfast. 60 capsule 6  . Vitamin E Skin OIL Apply 1 application topically daily as needed (scarring).     No current facility-administered medications on file prior to visit.    Past Surgical History:  Procedure Laterality Date  . BREAST LUMPECTOMY Left 07/18/2019   Procedure: LEFT BREAST LUMPECTOMY;  Surgeon: Coralie Keens, MD;  Location: [place];  Service: General;  Laterality: Left;  LMA  . COLONOSCOPY  2019  . RE-EXCISION OF BREAST LUMPECTOMY Left 09/05/2019   Procedure: RE-EXCISION OF LEFT BREAST TUMOR;  Surgeon: Coralie Keens, MD;  Location: [place];  Service: General;  Laterality: Left;  LMA  . SKIN BIOPSY     ?basal/squam cell on chest  . TONSILLECTOMY AND ADENOIDECTOMY    . TOTAL KNEE ARTHROPLASTY Right 03/04/2019  Procedure: RIGHT TOTAL KNEE ARTHROPLASTY;  Surgeon: [name], MD;  Location: [place];  Service: Orthopedics;  Laterality: Right;  . UPPER GI ENDOSCOPY      Allergies  Allergen Reactions  . Zetia [Ezetimibe] Diarrhea    Patient states she had IBS, that took a year to resolve.  . Prednisone Nausea And Vomiting  . Statins     paralyzing  . Strattera [Atomoxetine Hcl]     Increased sadness    Social History   Socioeconomic History  . Marital status: Married    Spouse name: Not on file  . Number of children: Not on file  . Years of education: Not on file  . Highest education level: Not on file  Occupational History  . Not on file  Tobacco Use  . Smoking status: Former Smoker    Packs/day: 0.50    Years: 15.00    Pack  years: 7.50    Types: Cigarettes    Quit date: 03/23/1980    Years since quitting: 39.7  . Smokeless tobacco: Never Used  Vaping Use  . Vaping Use: Never used  Substance and Sexual Activity  . Alcohol use: Yes    Alcohol/week: 0.0 standard drinks    Comment: social  . Drug use: No  . Sexual activity: Yes    Birth control/protection: Post-menopausal  Other Topics Concern  . Not on file  Social History Narrative  . Not on file   Social Determinants of Health   Financial Resource Strain:   . Difficulty of Paying Living Expenses: Not on file  Food Insecurity:   . Worried About Charity fundraiser in the Last Year: Not on file  . Ran Out of Food in the Last Year: Not on file  Transportation Needs:   . Lack of Transportation (Medical): Not on file  . Lack of Transportation (Non-Medical): Not on file  Physical Activity:   . Days of Exercise per Week: Not on file  . Minutes of Exercise per Session: Not on file  Stress:   . Feeling of Stress : Not on file  Social Connections:   . Frequency of Communication with Friends and Family: Not on file  . Frequency of Social Gatherings with Friends and Family: Not on file  . Attends Religious Services: Not on file  . Active Member of Clubs or Organizations: Not on file  . Attends Archivist Meetings: Not on file  . Marital Status: Not on file  Intimate Partner Violence:   . Fear of Current or Ex-Partner: Not on file  . Emotionally Abused: Not on file  . Physically Abused: Not on file  . Sexually Abused: Not on file    Family History  Problem Relation Age of Onset  . Diabetes Father   . Hypertension Mother   . Depression Mother   . Macular degeneration Mother   . Heart disease Mother     BP 132/77   Ht 5\' 2"  (1.575 m)   Wt 192 lb (87.1 kg)   LMP  (LMP Unknown)   BMI 35.12 kg/m   [place] [facility] 12/25/2019  Frequency of aerobic exercise (# of days/week) 3  Average time in minutes 60   Frequency of strengthening activities (# of days/week) 0    No flowsheet data found.  Review of Systems: See HPI above.     Objective:  Physical Exam:  Gen: NAD, comfortable in exam room  Bilateral feet/ankles: Moderate pronation bilateral long arches.  Splaying left foot between 1st-3rd digits.  Transverse arch collapse worse left than right.  No other gross deformity, swelling, ecchymoses FROM ankles with normal strength, mild pain with resisted plantarflexion. TTP worst right achilles insertion, bilateral proximal plantar fascia especially at insertion on medial calcaneus.  Tender also left foot dorsally over mid-lateral TMT joint. Negative ant drawer and talar tilt.   Negative syndesmotic compression. Thompsons test negative. NV intact distally.  Limited MSK u/s:  Achilles tendons intact though with calcification noted within right achilles at insertion with neovascularity.  Thickening of bilateral plantar fascias, more on left than right.  Moderate TMT arthritis laterally worse on left than right with noted effusion on left.  No nonunion visualized of metatarsals.  Assessment & Plan:  1. Bilateral foot pain - primarily due to plantar fasciitis and achilles tendinopathy though some pain related to TMT arthropathy as well.  Stressed importance of arch supports though ones she prefers have too much - sports insoles with scaphoid pads provided.  Diclofenac gel, tylenol if needed.  Shown home exercises and stretches to do daily.  Icing.  Avoid barefoot walking and flat shoes.  Arch binders.  Consider formal PT, custom orthotics.  F/u in 6 weeks.

## 2020-01-06 ENCOUNTER — Other Ambulatory Visit: Payer: Self-pay | Admitting: Adult Health

## 2020-01-06 ENCOUNTER — Other Ambulatory Visit: Payer: Self-pay

## 2020-01-06 ENCOUNTER — Ambulatory Visit (INDEPENDENT_AMBULATORY_CARE_PROVIDER_SITE_OTHER): Payer: Medicare Other | Admitting: Adult Health

## 2020-01-06 ENCOUNTER — Encounter: Payer: Self-pay | Admitting: Adult Health

## 2020-01-06 VITALS — BP 140/80 | HR 68 | Temp 97.7°F | Wt 199.0 lb

## 2020-01-06 DIAGNOSIS — Z23 Encounter for immunization: Secondary | ICD-10-CM | POA: Diagnosis not present

## 2020-01-06 DIAGNOSIS — H9201 Otalgia, right ear: Secondary | ICD-10-CM | POA: Diagnosis not present

## 2020-01-06 MED ORDER — FLUTICASONE PROPIONATE 50 MCG/ACT NA SUSP: 1.0000 | Freq: Every day | NASAL | 1 refills | Status: DC

## 2020-01-06 NOTE — Patient Instructions (Signed)
Your ears and sinuses are connected by the eustachian tube. When your sinuses are inflamed, this can close off the tube and cause fluid to collect in your middle ear. This can then cause dizziness, popping, clicking, ringing, and echoing in your ears. This is often NOT an infection and does NOT require antibiotics, it is caused by inflammation so the treatments help the inflammation. This can take a long time to get better so please be patient.  Here are things you can do to help with this: - Try the Flonase or Nasonex. Remember to spray each nostril twice towards the outer part of your eye.  Do not sniff but instead pinch your nose and tilt your head back to help the medicine get into your sinuses.  The best time to do this is at bedtime.Stop if you get blurred vision or nose bleeds.  -While drinking fluids, pinch and hold nose close and swallow, to help open eustachian tubes to drain fluid behind ear drums. -Please pick one of the over the counter allergy medications below and take it once daily for allergies.  It will also help with fluid behind ear drums. Claritin or loratadine cheapest but likely the weakest  Zyrtec or certizine at night because it can make you sleepy The strongest is allegra or fexafinadine  Cheapest at [employer], [employer], [employer] -can use decongestant over the counter, please do not use if you have high blood pressure or certain heart conditions.   if worsening HA, changes vision/speech, imbalance, weakness go to the ER   

## 2020-01-06 NOTE — Progress Notes (Signed)
Assessment and Plan:  Kathy Cook was seen today for ear pain.  Diagnoses and all orders for this visit:  Right ear pain With benign exam, non-specific; possible eustachian tube dysfunction -Allergy pill, flonase, autoinflation, explained no need for ABX at this time.  - continue tylenol/NSAID PRN for pain and monitor  - if not better 2-4 weeks will refer to ENT, follow up sooner if any new sx/getting worse, HA, fever/chills or discharge suggestive of progressive infection  -     fluticasone (FLONASE) 50 MCG/ACT nasal spray; Place 1-2 sprays into both nostrils at bedtime.   Need for immunization against influenza -     Flu vaccine HIGH DOSE PF  Further disposition pending results of labs. Discussed med's effects and SE's.   Over 15 minutes of exam, counseling, chart review, and critical decision making was performed.   Future Appointments  Date Time Provider [place]  01/15/2020 11:00 AM Magrinat, Virgie Dad, MD [facility] None  05/07/2020  3:00 PM McClanahan, Danton Sewer, NP [facility] None    ------------------------------------------------------------------------------------------------------------------   HPI BP 140/80    Pulse 68    Temp 97.7 F (36.5 C)    Wt 199 lb (90.3 kg)    LMP  (LMP Unknown)    SpO2 97%    BMI 36.40 kg/m   74 y.o.female presents for R sided ear ache.   She reports has been swimming/doing regular water aerobics in recent months   She started noting R ear pain, tenderness, external sense of itching 3 days ago, has improved some, now sense of swelling in her neck and "deep internal itching."   She denies HA, changes in hearing, tinnitis, ear discharge, fever/chills She endorses mild intermittent scratchy throat, intermittent throbbing sensation  Has tried topical alcohol and vinegar drops, without significant improvement  She takes tylenol and aleve for other chronic conditions, unsure if this helped.   She notes having fall allergies, has been taking  claritin for last month.   Past Medical History:  Diagnosis Date   Allergy    Anxiety    Attention deficit disorder (ADD)    Cancer ([facility])    ? basal/squam cell on chest, and forehead   Depression    Dry eyes    GERD (gastroesophageal reflux disease)    diet controlled, no meds   HLD (hyperlipidemia)    HSV infection    Lumbar degenerative disc disease    Ocular rosacea    Osteopenia      Allergies  Allergen Reactions   Zetia [Ezetimibe] Diarrhea    Patient states she had IBS, that took a year to resolve.   Prednisone Nausea And Vomiting   Statins     paralyzing   Strattera [Atomoxetine Hcl]     Increased sadness    Current Outpatient Medications on File Prior to Visit  Medication Sig   acetaminophen (TYLENOL) 500 MG tablet Take 500 mg by mouth every 6 (six) hours as needed for moderate pain or headache.   amphetamine-dextroamphetamine (ADDERALL) 30 MG tablet Take 1/2 to 1 tablet 2 x /day as needed for Focus & Concentration   aspirin EC 81 MG tablet Take 81 mg by mouth daily. Swallow whole.   Cholecalciferol (DIALYVITE VITAMIN D 5000) 125 MCG (5000 UT) capsule Take 10,000 Units by mouth daily.   loratadine (CLARITIN) 10 MG tablet Take 10 mg by mouth daily as needed for allergies.   Olopatadine HCl (PATADAY OP) Place 1 drop into both eyes daily as needed (dry eyes).  venlafaxine XR (EFFEXOR-XR) 37.5 MG 24 hr capsule Take 1 capsule (37.5 mg total) by mouth daily with breakfast.   Vitamin E Skin OIL Apply 1 application topically daily as needed (scarring).   Colesevelam HCl 3.75 g PACK colesevelam 3.75 gram oral powder packet  DISSOLVE 1 PACKET IN WATER OR JUICE AND DRINK DAILY. (Patient not taking: Reported on 01/06/2020)   L-Tyrosine 500 MG CAPS Take 500 mg by mouth daily. (Patient not taking: Reported on 01/06/2020)   OVER THE COUNTER MEDICATION Take 550 mg by mouth daily. [place] Tail Mushroom otc supplement (Patient not taking: Reported on  01/06/2020)   OVER THE COUNTER MEDICATION Apply 1 application topically daily as needed (swelling / pain). Hemp cream (Patient not taking: Reported on 01/06/2020)   No current facility-administered medications on file prior to visit.    ROS: all negative except above.   Physical Exam:  BP 140/80    Pulse 68    Temp 97.7 F (36.5 C)    Wt 199 lb (90.3 kg)    LMP  (LMP Unknown)    SpO2 97%    BMI 36.40 kg/m   General Appearance: Well nourished, in no apparent distress. Eyes: PERRLA, EOMs, conjunctiva no swelling or erythema Sinuses: No Frontal/maxillary tenderness ENT/Mouth: Ext aud canals clear, TMs without erythema, bulging. No erythema, swelling, or exudate on post pharynx.  Tonsils not swollen or erythematous. Hearing normal.  Neck: Supple, thyroid normal. Has mild upper R neck tenderness under anterior cervical chain, soft tissue below angle of mandible.  Respiratory: Respiratory effort normal, BS equal bilaterally without rales, rhonchi, wheezing or stridor.  Cardio: RRR with no MRGs. Brisk peripheral pulses without edema.  Abdomen: Soft, + BS.  Non tender, no guarding, rebound, hernias, masses. Lymphatics: Non tender without lymphadenopathy.  Musculoskeletal: normal gait. No TMJ tenderness, popping, cracking, dislocation Skin: Warm, dry without rashes, lesions, ecchymosis.  Neuro: Cranial nerves intact. Normal muscle tone, Sensation intact.  Psych: Awake and oriented X 3, normal affect, Insight and Judgment appropriate.     Izora Ribas, NP 3:18 PM [name]

## 2020-01-14 NOTE — Progress Notes (Signed)
[facility]  [phone] [phone]     ID: Kathy Cook DOB: Aug 30, 1945  MR#: [id number]  JYN#:[id number]  Patient Care Team: Unk Pinto, MD as PCP - General (Internal Medicine) Dian Queen, MD as Consulting Physician (Obstetrics and Gynecology) Clent Jacks, MD as Consulting Physician (Ophthalmology) Lindwood Coke, MD as Consulting Physician (Dermatology) Jyron Turman, Virgie Dad, MD as Consulting Physician (Oncology) Coralie Keens, MD as Consulting Physician (General Surgery) Eppie Gibson, MD as Attending Physician (Radiation Oncology) [name], MD as Consulting Physician (Orthopedic Surgery) Mauro Kaufmann, RN as Oncology Nurse Navigator Rockwell Germany, RN as Oncology Nurse Navigator Chauncey Cruel, MD OTHER MD:  CHIEF COMPLAINT: estrogen receptor positive breast cancer, phyllodes tumor  CURRENT TREATMENT:  tamoxifen   INTERVAL HISTORY: Jenaya returns today for follow up of her estrogen receptor positive breast cancer.   Since her last visit, she received radiation therapy from 10/14/2019 through 11/11/2019. She generally tolerated that well, with some skin changes of course but only mild fatigue. Occasionally the skin is slightly itchy. She is still using the radio Plex cream.    REVIEW OF SYSTEMS: [name] has a lot of energy, normal functional status, and is making good future plans. A detailed review of systems today was otherwise noncontributory   COVID 19 VACCINATION STATUS: Status post Pfizer x2, most recently March 2021   HISTORY OF CURRENT ILLNESS: From the original intake note:  Kathy Cook herself palpated an upper-central left breast mass. She underwent left diagnostic mammography with tomography and left breast ultrasonography at [facility] on 05/24/2019 showing: breast density category C; 4.7 cm palpable suspicious left breast mass at 12:30; negative for lymphadenopathy.  Accordingly on  05/28/2019 she proceeded to biopsy of the left breast area in question. The pathology from this procedure ([id number]) showed: fibroadenoma.  This was felt to be discordant and excision was recommended.  She opted to proceed with left lumpectomy on 07/18/2019 under Dr. Ninfa Linden. Pathology [id number]) revealed: ductal carcinoma in situ, low-grade, arising in a fibroepithelial lesion, 3.9 cm; margins were clear for the ductal carcinoma in situ but positive for the fibroepithelial lesion; prognostic indicators for the ductal carcinoma in situ significant for: estrogen receptor, 95% positive and progesterone receptor, 95% positive, both with strong to moderate staining intensity.   The patient's subsequent history is as detailed below.   PAST MEDICAL HISTORY: Past Medical History:  Diagnosis Date  . Allergy   . Anxiety   . Attention deficit disorder (ADD)   . [facility])    ? basal/squam cell on chest, and forehead  . Depression   . Dry eyes   . GERD (gastroesophageal reflux disease)    diet controlled, no meds  . HLD (hyperlipidemia)   . HSV infection   . Lumbar degenerative disc disease   . Ocular rosacea   . Osteopenia     PAST SURGICAL HISTORY: Past Surgical History:  Procedure Laterality Date  . BREAST LUMPECTOMY Left 07/18/2019   Procedure: LEFT BREAST LUMPECTOMY;  Surgeon: Coralie Keens, MD;  Location: [facility];  Service: General;  Laterality: Left;  LMA  . COLONOSCOPY  2019  . RE-EXCISION OF BREAST LUMPECTOMY Left 09/05/2019   Procedure: RE-EXCISION OF LEFT BREAST TUMOR;  Surgeon: Coralie Keens, MD;  Location: [facility];  Service: General;  Laterality: Left;  LMA  . SKIN BIOPSY     ?basal/squam cell on chest  . TONSILLECTOMY AND ADENOIDECTOMY    . TOTAL KNEE ARTHROPLASTY Right 03/04/2019  Procedure: RIGHT TOTAL KNEE ARTHROPLASTY;  Surgeon: [name], MD;  Location: [facility];  Service: Orthopedics;  Laterality: Right;  . UPPER GI ENDOSCOPY      FAMILY  HISTORY: Family History  Problem Relation Age of Onset  . Diabetes Father   . Hypertension Mother   . Depression Mother   . Macular degeneration Mother   . Heart disease Mother   The patient's father died at age 40 following a fall.  The patient's mother died at age 21 with heart disease problems.  The patient has 2 brothers no sisters.  1 brother has had his thyroid removed and may have had thyroid carcinoma; the second brother is a Teacher, music in [place].  There is no cancer in the family to the patient's knowledge.   GYNECOLOGIC HISTORY:  No LMP recorded (lmp unknown). Patient is postmenopausal. Menarche: 74 years old Age at first live birth: 74 years old [place] P 2 LMP 41 Hormone replacement; yes, more than 20 years Hysterectomy: no BSO? 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  Her husband of more than 50 years [name] ("[name]") works in finances.  Son Harrell Gave runs a business in [place].  Daughter Benjamine Mola is an attorney planning to move to [place] soon.  The patient has 2 grandchildren.  She is a member of [employer]: In the absence of any documents to the contrary the patient's husband is her healthcare power of attorney   HEALTH MAINTENANCE: Social History   Tobacco Use  . Smoking status: Former Smoker    Packs/day: 0.50    Years: 15.00    Pack years: 7.50    Types: Cigarettes    Quit date: 03/23/1980    Years since quitting: 39.8  . Smokeless tobacco: Never Used  Vaping Use  . Vaping Use: Never used  Substance Use Topics  . Alcohol use: Yes    Alcohol/week: 0.0 standard drinks    Comment: social  . Drug use: No     Colonoscopy: 06/2017 (Dr. Mayo Ao further colonoscopies planned  PAP: 2020 (Dr. Helane Rima)  Bone density: 2019 (at [facility])   Allergies  Allergen Reactions  . Zetia [Ezetimibe] Diarrhea    Patient states she had IBS, that took  a year to resolve.  . Prednisone Nausea And Vomiting  . Statins     paralyzing  . Strattera [Atomoxetine Hcl]     Increased sadness    Current Outpatient Medications  Medication Sig Dispense Refill  . acetaminophen (TYLENOL) 500 MG tablet Take 500 mg by mouth every 6 (six) hours as needed for moderate pain or headache.    . amphetamine-dextroamphetamine (ADDERALL) 30 MG tablet Take 1/2 to 1 tablet 2 x /day as needed for Focus & Concentration 60 tablet 0  . aspirin EC 81 MG tablet Take 81 mg by mouth daily. Swallow whole.    . Cholecalciferol (DIALYVITE VITAMIN D 5000) 125 MCG (5000 UT) capsule Take 10,000 Units by mouth daily.    . Colesevelam HCl 3.75 g PACK colesevelam 3.75 gram oral powder packet  DISSOLVE 1 PACKET IN WATER OR JUICE AND DRINK DAILY. (Patient not taking: Reported on 01/06/2020)    . fluticasone (FLONASE) 50 MCG/ACT nasal spray Use      1 to 2 sprays       to each Nostril        at Bedtime 48 g 3  . L-Tyrosine  500 MG CAPS Take 500 mg by mouth daily. (Patient not taking: Reported on 01/06/2020)    . loratadine (CLARITIN) 10 MG tablet Take 10 mg by mouth daily as needed for allergies.    . Olopatadine HCl (PATADAY OP) Place 1 drop into both eyes daily as needed (dry eyes).    Marland Kitchen OVER THE COUNTER MEDICATION Take 550 mg by mouth daily. [place] Tail Mushroom otc supplement (Patient not taking: Reported on 01/06/2020)    . OVER THE COUNTER MEDICATION Apply 1 application topically daily as needed (swelling / pain). Hemp cream (Patient not taking: Reported on 01/06/2020)    . tamoxifen (NOLVADEX) 20 MG tablet Take 1 tablet (20 mg total) by mouth daily. Start February 29, 2020 90 tablet 12  . venlafaxine XR (EFFEXOR-XR) 37.5 MG 24 hr capsule Take 1 capsule (37.5 mg total) by mouth daily with breakfast. 60 capsule 6  . Vitamin E Skin OIL Apply 1 application topically daily as needed (scarring).     No current facility-administered medications for this visit.    OBJECTIVE: White woman who  appears younger than stated age  35:   01/15/20 1113  BP: (!) 157/76  Pulse: 96  Resp: 18  Temp: 99.3 F (37.4 C)  SpO2: 96%     Body mass index is 36.09 kg/m.   Wt Readings from Last 3 Encounters:  01/15/20 197 lb 4.8 oz (89.5 kg)  01/06/20 199 lb (90.3 kg)  12/25/19 192 lb (87.1 kg)      ECOG FS:1 - Symptomatic but completely ambulatory  Sclerae unicteric, EOMs intact Wearing a mask No cervical or supraclavicular adenopathy Lungs no rales or rhonchi Heart regular rate and rhythm Abd soft, nontender, positive bowel sounds MSK no focal spinal tenderness, no upper extremity lymphedema Neuro: nonfocal, well oriented, appropriate affect Breasts: The right breast is unremarkable. The left breast is status post lumpectomy and radiation. The skin has healed very nicely and the cosmetic result is very good. There is no evidence of residual or recurrent disease.   LAB RESULTS:  CMP     Component Value Date/Time   NA 141 11/18/2019 1012   K 4.4 11/18/2019 1012   CL 104 11/18/2019 1012   CO2 30 11/18/2019 1012   GLUCOSE 102 (H) 11/18/2019 1012   BUN 23 11/18/2019 1012   CREATININE 0.86 11/18/2019 1012   CALCIUM 9.8 11/18/2019 1012   PROT 6.4 11/18/2019 1012   ALBUMIN 3.9 08/07/2019 1518   AST 12 11/18/2019 1012   AST 17 08/07/2019 1518   ALT 13 11/18/2019 1012   ALT 18 08/07/2019 1518   ALKPHOS 78 08/07/2019 1518   BILITOT 0.4 11/18/2019 1012   BILITOT 0.4 08/07/2019 1518   GFRNONAA 68 11/18/2019 1012   GFRAA 79 11/18/2019 1012    No results found for: TOTALPROTELP, ALBUMINELP, A1GS, A2GS, BETS, BETA2SER, GAMS, MSPIKE, SPEI  Lab Results  Component Value Date   WBC 4.9 11/18/2019   NEUTROABS 3,376 11/18/2019   HGB 14.1 11/18/2019   HCT 41.8 11/18/2019   MCV 96.8 11/18/2019   PLT 188 11/18/2019    No results found for: LABCA2  No components found for: [id number]  No results for input(s): INR in the last 168 hours.  No results found for: LABCA2  No  results found for: HAL937  No results found for: TKW409  No results found for: BDZ329  No results found for: CA2729  No components found for: HGQUANT  No results found for: CEA1 / No results found  for: CEA1   No results found for: AFPTUMOR  No results found for: CHROMOGRNA  No results found for: KPAFRELGTCHN, LAMBDASER, KAPLAMBRATIO (kappa/lambda light chains)  No results found for: HGBA, HGBA2QUANT, HGBFQUANT, HGBSQUAN (Hemoglobinopathy evaluation)   No results found for: LDH  Lab Results  Component Value Date   IRON 114 04/05/2017   TIBC 332 04/05/2017   IRONPCTSAT 34 04/05/2017   (Iron and TIBC)  Lab Results  Component Value Date   FERRITIN 166 04/04/2016    Urinalysis    Component Value Date/Time   COLORURINE YELLOW 02/20/2019 1332   APPEARANCEUR HAZY (A) 02/20/2019 1332   LABSPEC >1.030 (H) 02/20/2019 1332   PHURINE 5.5 02/20/2019 1332   GLUCOSEU NEGATIVE 02/20/2019 1332   HGBUR NEGATIVE 02/20/2019 1332   BILIRUBINUR NEGATIVE 02/20/2019 1332   KETONESUR NEGATIVE 02/20/2019 1332   PROTEINUR NEGATIVE 02/20/2019 1332   UROBILINOGEN 0.2 12/31/2012 1151   NITRITE NEGATIVE 02/20/2019 1332   LEUKOCYTESUR NEGATIVE 02/20/2019 1332    STUDIES: [place] LIMITED JOINT SPACE STRUCTURES LOW LEFT  Result Date: 01/02/2020 Limited MSK u/s:  Achilles tendons intact though with calcification noted within right achilles at insertion with neovascularity.  Thickening of bilateral plantar fascias, more on left than right.  Moderate TMT arthritis laterally worse on left than right with noted effusion on left.  No nonunion visualized of metatarsals.    ELIGIBLE FOR AVAILABLE RESEARCH PROTOCOL: AET  ASSESSMENT: 74 y.o. [place] woman status post left lumpectomy 07/26/2019 for ductal carcinoma in situ, estrogen receptor positive, arising in a phyllodes tumor [grading of the lesion is difficult since it has some features of a benign lesion (i.e., <5 mitoses/10 HPF) and some  features of a borderline lesion (particularly the irregular border)]   (a) the ductal carcinoma in situ is low-grade, does not involve the margins, and is strongly estrogen and progesterone receptor positive  (b) margins are broadly positive for the fibroepithelial lesion  (c) additional surgery 09/05/2019 for margin clearance found no residual phyllodes tumor or DCIS  (1) adjuvant radiation 10/14/2019-11/11/2019  (2) to start tamoxifen 02/29/2020   PLAN: Cleone has completed local treatment for her noninvasive breast cancer. She has done very well with that and she has an excellent prognosis.  The prognosis of course will be improved slightly by antiestrogens but the real reason for antiestrogens in her case is prophylaxis. Antiestrogens will cut in half her risk of developing another breast cancer in the future and this is significant for her.  We discussed the difference between tamoxifen and anastrozole in detail. She understands that anastrozole and the aromatase inhibitors in general work by blocking estrogen production. Accordingly vaginal dryness, decrease in bone density, and of course hot flashes can result. The aromatase inhibitors can also negatively affect the cholesterol profile, although that is a minor effect. One out of 5 women on aromatase inhibitors we will feel "old and achy". This arthralgia/myalgia syndrome, which resembles fibromyalgia clinically, does resolve with stopping the medications. Accordingly this is not a reason to not try an aromatase inhibitor but it is a frequent reason to stop it (in other words 20% of women will not be able to tolerate these medications).  Tamoxifen on the other hand does not block estrogen production. It does not "take away a woman's estrogen". It blocks the estrogen receptor in breast cells. Like anastrozole, it can also cause hot flashes. As opposed to anastrozole, tamoxifen has many estrogen-like effects. It is technically an estrogen  receptor modulator. This means that in some tissues  tamoxifen works like estrogen-- for example it helps strengthen the bones. It tends to improve the cholesterol profile. It can cause thickening of the endometrial lining, and even endometrial polyps or rarely cancer of the uterus.(The risk of uterine cancer due to tamoxifen is one additional cancer per thousand women year). It can cause vaginal wetness or stickiness. It can cause blood clots through this estrogen-like effect--the risk of blood clots with tamoxifen is exactly the same as with birth control pills or hormone replacement.  Neither of these agents causes mood changes or weight gain, despite the popular belief that they can have these side effects. We have data from studies comparing either of these drugs with placebo, and in those cases the control group had the same amount of weight gain and depression as the group that took the drug. ' We are going to give tamoxifen to try. I think it would be best if we waited a little longer after the treatment she has had and let her get through the holidays. [employer] start rate is January 1. We will do a virtual visit in February, and if all is well the plan will be to continue tamoxifen for a total of 5 years. I would then see her after her mammogram in June or July.  Total encounter time 35 minutes.Sarajane Jews C. Janitza Revuelta, MD 01/15/2020 5:08 PM Medical Oncology and Hematology [facility] [place], [place] [address] Tel. [phone]    Fax. [phone]   This document serves as a record of services personally performed by Lurline Del, MD. It was created on his behalf by Wilburn Mylar, a trained medical scribe. The creation of this record is based on the scribe's personal observations and the provider's statements to them.   I, Lurline Del MD, have reviewed the above documentation for accuracy and completeness, and I agree with the above.   *Total  Encounter Time as defined by the Centers for Medicare and Medicaid Services includes, in addition to the face-to-face time of a patient visit (documented in the note above) non-face-to-face time: obtaining and reviewing outside history, ordering and reviewing medications, tests or procedures, care coordination (communications with other health care professionals or caregivers) and documentation in the medical record.

## 2020-01-15 ENCOUNTER — Inpatient Hospital Stay: Payer: Medicare Other | Attending: Oncology | Admitting: Oncology

## 2020-01-15 ENCOUNTER — Other Ambulatory Visit: Payer: Self-pay

## 2020-01-15 VITALS — BP 157/76 | HR 96 | Temp 99.3°F | Resp 18 | Ht 62.0 in | Wt 197.3 lb

## 2020-01-15 DIAGNOSIS — Z85828 Personal history of other malignant neoplasm of skin: Secondary | ICD-10-CM | POA: Diagnosis not present

## 2020-01-15 DIAGNOSIS — Z87891 Personal history of nicotine dependence: Secondary | ICD-10-CM | POA: Insufficient documentation

## 2020-01-15 DIAGNOSIS — D0512 Intraductal carcinoma in situ of left breast: Secondary | ICD-10-CM | POA: Diagnosis not present

## 2020-01-15 DIAGNOSIS — Z170 Estrogen receptor positive status [ER+]: Secondary | ICD-10-CM | POA: Diagnosis not present

## 2020-01-15 DIAGNOSIS — C50912 Malignant neoplasm of unspecified site of left female breast: Secondary | ICD-10-CM | POA: Diagnosis not present

## 2020-01-15 MED ORDER — TAMOXIFEN CITRATE 20 MG PO TABS
20.0000 mg | ORAL_TABLET | Freq: Every day | ORAL | 12 refills | Status: AC
Start: 2020-01-15 — End: 2020-02-14

## 2020-01-17 ENCOUNTER — Telehealth: Payer: Self-pay | Admitting: Oncology

## 2020-01-17 NOTE — Telephone Encounter (Signed)
Scheduled appts per 11/17 los. Pt confirmed appt date and time.

## 2020-01-29 ENCOUNTER — Other Ambulatory Visit: Payer: Self-pay | Admitting: Adult Health

## 2020-01-29 MED ORDER — AMPHETAMINE-DEXTROAMPHETAMINE 30 MG PO TABS: ORAL_TABLET | ORAL | 0 refills | Status: DC

## 2020-01-29 MED ORDER — FAMCICLOVIR 500 MG PO TABS: 500.0000 mg | ORAL_TABLET | Freq: Two times a day (BID) | ORAL | 1 refills | Status: DC | PRN

## 2020-01-29 NOTE — Progress Notes (Signed)
Future Appointments  Date Time Provider [place]  04/20/2020  9:30 AM Magrinat, Virgie Dad, MD [facility] None  05/07/2020  3:00 PM Garnet Sierras, NP [facility] None   PDMP checked for adderall refill request.

## 2020-03-02 NOTE — Progress Notes (Signed)
  Patient Name: Kathy Cook MRN: [id number] DOB: 11-05-1945 Referring Physician: Abigail Miyamoto (Profile Not Attached) Date of Service: 11/11/2019 [facility], [place]                                                        End Of Treatment Note  Diagnoses: D05.12-Intraductal carcinoma in situ of left breast  Left breast phyllodes tumor and DCIS  Intent: Curative  Radiation Treatment Dates: 10/14/2019 through 11/11/2019 Site Technique Total Dose (Gy) Dose per Fx (Gy) Completed Fx Beam Energies  Breast, Left: Breast_Lt 3D 40.05/40.05 2.67 15/15 6X, 10X  Breast, Left: Breast_Lt_Bst 3D 10/10 2 5/5 6X, 10X   Narrative: The patient tolerated radiation therapy relatively well.   Plan: The patient will follow-up with radiation oncology in 1 mo, or as needed. -----------------------------------  Lonie Peak, MD

## 2020-03-09 ENCOUNTER — Other Ambulatory Visit: Payer: Self-pay | Admitting: Internal Medicine

## 2020-03-09 DIAGNOSIS — Z1231 Encounter for screening mammogram for malignant neoplasm of breast: Secondary | ICD-10-CM

## 2020-03-20 ENCOUNTER — Telehealth: Payer: Self-pay | Admitting: *Deleted

## 2020-03-20 NOTE — Telephone Encounter (Signed)
Attempted call to check status at 1 year post-op from [facility] with Dr. Lorin Mercy. No answer and left VM requesting call back.

## 2020-03-25 ENCOUNTER — Telehealth: Payer: Self-pay | Admitting: *Deleted

## 2020-03-25 NOTE — Telephone Encounter (Signed)
Ortho bundle 1 year call completed. ?

## 2020-04-19 NOTE — Progress Notes (Signed)
[facility]  [phone] [phone]     ID: Kathy Cook DOB: 20-Nov-1945  MR#: [id number]  TWS#:[id number]  Patient Care Team: Unk Pinto, MD as PCP - General (Internal Medicine) Dian Queen, MD as Consulting Physician (Obstetrics and Gynecology) Clent Jacks, MD as Consulting Physician (Ophthalmology) Lindwood Coke, MD as Consulting Physician (Dermatology) Magrinat, Virgie Dad, MD as Consulting Physician (Oncology) Coralie Keens, MD as Consulting Physician (General Surgery) Eppie Gibson, MD as Attending Physician (Radiation Oncology) [name], MD as Consulting Physician (Orthopedic Surgery) Mauro Kaufmann, RN as Oncology Nurse Navigator Rockwell Germany, RN as Oncology Nurse Navigator Aurea Graff OTHER MD:  I poned Jerre Simon on 04/19/20   CHIEF COMPLAINT: estrogen receptor positive breast cancer, phyllodes tumor  CURRENT TREATMENT:  tamoxifen   INTERVAL HISTORY: Kathy Cook  She was scheduled to begin tamoxifen on 02/29/2020.  She is scheduled for routine screening mammography on 05/25/2020.   REVIEW OF SYSTEMS: Kathy Cook    COVID 56 VACCINATION STATUS: Status post [place] x2, most recently March 2021   HISTORY OF CURRENT ILLNESS: From the original intake note:  Kathy Cook herself palpated an upper-central left breast mass. She underwent left diagnostic mammography with tomography and left breast ultrasonography at [facility] on 05/24/2019 showing: breast density category C; 4.7 cm palpable suspicious left breast mass at 12:30; negative for lymphadenopathy.  Accordingly on 05/28/2019 she proceeded to biopsy of the left breast area in question. The pathology from this procedure ([id number]) showed: fibroadenoma.  This was felt to be discordant and excision was recommended.  She opted to proceed with left lumpectomy on 07/18/2019 under Dr. Ninfa Linden. Pathology [id number]) revealed: ductal  carcinoma in situ, low-grade, arising in a fibroepithelial lesion, 3.9 cm; margins were clear for the ductal carcinoma in situ but positive for the fibroepithelial lesion; prognostic indicators for the ductal carcinoma in situ significant for: estrogen receptor, 95% positive and progesterone receptor, 95% positive, both with strong to moderate staining intensity.   The patient's subsequent history is as detailed below.   PAST MEDICAL HISTORY: Past Medical History:  Diagnosis Date  . Allergy   . Anxiety   . Attention deficit disorder (ADD)   . Cancer [facility])    ? basal/squam cell on chest, and forehead  . Depression   . Dry eyes   . GERD (gastroesophageal reflux disease)    diet controlled, no meds  . HLD (hyperlipidemia)   . HSV infection   . Lumbar degenerative disc disease   . Ocular rosacea   . Osteopenia     PAST SURGICAL HISTORY: Past Surgical History:  Procedure Laterality Date  . BREAST LUMPECTOMY Left 07/18/2019   Procedure: LEFT BREAST LUMPECTOMY;  Surgeon: Coralie Keens, MD;  Location: [facility];  Service: General;  Laterality: Left;  LMA  . COLONOSCOPY  2019  . RE-EXCISION OF BREAST LUMPECTOMY Left 09/05/2019   Procedure: RE-EXCISION OF LEFT BREAST TUMOR;  Surgeon: Coralie Keens, MD;  Location: [facility];  Service: General;  Laterality: Left;  LMA  . SKIN BIOPSY     ?basal/squam cell on chest  . TONSILLECTOMY AND ADENOIDECTOMY    . TOTAL KNEE ARTHROPLASTY Right 03/04/2019   Procedure: RIGHT TOTAL KNEE ARTHROPLASTY;  Surgeon: [name], MD;  Location: [facility];  Service: Orthopedics;  Laterality: Right;  . UPPER GI ENDOSCOPY      FAMILY HISTORY: Family History  Problem Relation Age of Onset  . Diabetes Father   . Hypertension  Mother   . Depression Mother   . Macular degeneration Mother   . Heart disease Mother   The patient's father died at age 30 following a fall.  The patient's mother died at age 82 with heart disease problems.  The patient  has 2 brothers no sisters.  1 brother has had his thyroid removed and may have had thyroid carcinoma; the second brother is a Teacher, music in [place].  There is no cancer in the family to the patient's knowledge.   GYNECOLOGIC HISTORY:  No LMP recorded (lmp unknown). Patient is postmenopausal. Menarche: 75 years old Age at first live birth: 75 years old [place] P 2 LMP 40 Hormone replacement; yes, more than 20 years Hysterectomy: no BSO? 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  Her husband of more than 50 years Kathy Cook ("[name]") works in finances.  Son Kathy Cook runs a business in [place].  Daughter Kathy Cook is an attorney planning to move to [place] soon.  The patient has 2 grandchildren.  She is a member of [employer]: In the absence of any documents to the contrary the patient's husband is her healthcare power of attorney   HEALTH MAINTENANCE: Social History   Tobacco Use  . Smoking status: Former Smoker    Packs/day: 0.50    Years: 15.00    Pack years: 7.50    Types: Cigarettes    Quit date: 03/23/1980    Years since quitting: 40.1  . Smokeless tobacco: Never Used  Vaping Use  . Vaping Use: Never used  Substance Use Topics  . Alcohol use: Yes    Alcohol/week: 0.0 standard drinks    Comment: social  . Drug use: No     Colonoscopy: 06/2017 (Dr. Mayo Ao further colonoscopies planned  PAP: 2020 (Dr. Helane Rima)  Bone density: 2019 (at [facility])   Allergies  Allergen Reactions  . Zetia [Ezetimibe] Diarrhea    Patient states she had IBS, that took a year to resolve.  . Prednisone Nausea And Vomiting  . Statins     paralyzing  . Strattera [Atomoxetine Hcl]     Increased sadness    Current Outpatient Medications  Medication Sig Dispense Refill  . acetaminophen (TYLENOL) 500 MG tablet Take 500 mg by mouth every 6 (six) hours as needed for moderate pain  or headache.    . amphetamine-dextroamphetamine (ADDERALL) 30 MG tablet Take 1/2 to 1 tablet 2 x /day as needed for Focus & Concentration 60 tablet 0  . aspirin EC 81 MG tablet Take 81 mg by mouth daily. Swallow whole.    . Cholecalciferol (DIALYVITE VITAMIN D 5000) 125 MCG (5000 UT) capsule Take 10,000 Units by mouth daily.    . famciclovir (FAMVIR) 500 MG tablet Take 1 tablet (500 mg total) by mouth 2 (two) times daily as needed (fever blisters). 60 tablet 1  . fluticasone (FLONASE) 50 MCG/ACT nasal spray Use      1 to 2 sprays       to each Nostril        at Bedtime 48 g 3  . L-Tyrosine 500 MG CAPS Take 500 mg by mouth daily. (Patient not taking: Reported on 01/06/2020)    . loratadine (CLARITIN) 10 MG tablet Take 10 mg by mouth daily as needed for allergies.    . Olopatadine HCl (PATADAY OP) Place 1 drop into both eyes daily as needed (dry eyes).    Marland Kitchen  OVER THE COUNTER MEDICATION Take 550 mg by mouth daily. [place] Mushroom otc supplement (Patient not taking: Reported on 01/06/2020)    . venlafaxine XR (EFFEXOR-XR) 37.5 MG 24 hr capsule Take 1 capsule (37.5 mg total) by mouth daily with breakfast. 60 capsule 6  . Vitamin E Skin OIL Apply 1 application topically daily as needed (scarring).     No current facility-administered medications for this visit.    OBJECTIVE:   There were no vitals filed for this visit.   There is no height or weight on file to calculate BMI.   Wt Readings from Last 3 Encounters:  01/15/20 197 lb 4.8 oz (89.5 kg)  01/06/20 199 lb (90.3 kg)  12/25/19 192 lb (87.1 kg)      ECOG FS:1 - Symptomatic but completely ambulatory  Telemedicine visit 04/20/2020    LAB RESULTS:  CMP     Component Value Date/Time   NA 141 11/18/2019 1012   K 4.4 11/18/2019 1012   CL 104 11/18/2019 1012   CO2 30 11/18/2019 1012   GLUCOSE 102 (H) 11/18/2019 1012   BUN 23 11/18/2019 1012   CREATININE 0.86 11/18/2019 1012   CALCIUM 9.8 11/18/2019 1012   PROT 6.4 11/18/2019 1012    ALBUMIN 3.9 08/07/2019 1518   AST 12 11/18/2019 1012   AST 17 08/07/2019 1518   ALT 13 11/18/2019 1012   ALT 18 08/07/2019 1518   ALKPHOS 78 08/07/2019 1518   BILITOT 0.4 11/18/2019 1012   BILITOT 0.4 08/07/2019 1518   GFRNONAA 68 11/18/2019 1012   GFRAA 79 11/18/2019 1012    No results found for: TOTALPROTELP, ALBUMINELP, A1GS, A2GS, BETS, BETA2SER, GAMS, MSPIKE, SPEI  Lab Results  Component Value Date   WBC 4.9 11/18/2019   NEUTROABS 3,376 11/18/2019   HGB 14.1 11/18/2019   HCT 41.8 11/18/2019   MCV 96.8 11/18/2019   PLT 188 11/18/2019    No results found for: LABCA2  No components found for: ASNKNL976  No results for input(s): INR in the last 168 hours.  No results found for: LABCA2  No results found for: BHA193  No results found for: XTK240  No results found for: XBD532  No results found for: CA2729  No components found for: HGQUANT  No results found for: CEA1 / No results found for: CEA1   No results found for: AFPTUMOR  No results found for: CHROMOGRNA  No results found for: KPAFRELGTCHN, LAMBDASER, KAPLAMBRATIO (kappa/lambda light chains)  No results found for: HGBA, HGBA2QUANT, HGBFQUANT, HGBSQUAN (Hemoglobinopathy evaluation)   No results found for: LDH  Lab Results  Component Value Date   IRON 114 04/05/2017   TIBC 332 04/05/2017   IRONPCTSAT 34 04/05/2017   (Iron and TIBC)  Lab Results  Component Value Date   FERRITIN 166 04/04/2016    Urinalysis    Component Value Date/Time   COLORURINE YELLOW 02/20/2019 1332   APPEARANCEUR HAZY (A) 02/20/2019 1332   LABSPEC >1.030 (H) 02/20/2019 1332   PHURINE 5.5 02/20/2019 1332   GLUCOSEU NEGATIVE 02/20/2019 1332   HGBUR NEGATIVE 02/20/2019 1332   BILIRUBINUR NEGATIVE 02/20/2019 1332   KETONESUR NEGATIVE 02/20/2019 1332   PROTEINUR NEGATIVE 02/20/2019 1332   UROBILINOGEN 0.2 12/31/2012 1151   NITRITE NEGATIVE 02/20/2019 1332   LEUKOCYTESUR NEGATIVE 02/20/2019 1332    STUDIES: No  results found.   ELIGIBLE FOR AVAILABLE RESEARCH PROTOCOL: AET  ASSESSMENT: 75 y.o. [place] woman status post left lumpectomy 07/26/2019 for ductal carcinoma in situ, estrogen receptor positive, arising  in a phyllodes tumor [grading of the lesion is difficult since it has some features of a benign lesion (i.e., <5 mitoses/10 HPF) and some features of a borderline lesion (particularly the irregular border)]   (a) the ductal carcinoma in situ is low-grade, does not involve the margins, and is strongly estrogen and progesterone receptor positive  (b) margins are broadly positive for the fibroepithelial lesion  (c) additional surgery 09/05/2019 for margin clearance found no residual phyllodes tumor or DCIS  (1) adjuvant radiation 10/14/2019-11/11/2019 Site Technique Total Dose (Gy) Dose per Fx (Gy) Completed Fx Beam Energies  Breast, Left: Breast_Lt 3D 40.05/40.05 2.67 15/15 6X, 10X  Breast, Left: Breast_Lt_Bst 3D 10/10 2 5/5 6X, 10X   (2) to start tamoxifen 02/29/2020   PLAN: I was unable to contact the patient 04/20/2020 as scheduled.  We will try again within the next couple of weeks  Sarajane Jews C. Magrinat, MD 04/19/2020 11:10 PM Medical Oncology and Hematology [facility] [address], [place] [address] Tel. [phone]    Fax. [phone]   This document serves as a record of services personally performed by Lurline Del, MD. It was created on his behalf by Wilburn Mylar, a trained medical scribe. The creation of this record is based on the scribe's personal observations and the provider's statements to them.   I, Lurline Del MD, have reviewed the above documentation for accuracy and completeness, and I agree with the above.   *Total Encounter Time as defined by the Centers for Medicare and Medicaid Services includes, in addition to the face-to-face time of a patient visit (documented in the note above) non-face-to-face time: obtaining and  reviewing outside history, ordering and reviewing medications, tests or procedures, care coordination (communications with other health care professionals or caregivers) and documentation in the medical record.

## 2020-04-20 ENCOUNTER — Inpatient Hospital Stay: Payer: Medicare Other | Attending: Oncology | Admitting: Oncology

## 2020-04-20 DIAGNOSIS — D0512 Intraductal carcinoma in situ of left breast: Secondary | ICD-10-CM

## 2020-04-21 ENCOUNTER — Telehealth: Payer: Self-pay | Admitting: Oncology

## 2020-04-21 NOTE — Telephone Encounter (Signed)
Scheduled appts per 2/22 los. Left voicemail with appt date and time.

## 2020-04-29 NOTE — Progress Notes (Signed)
[place]  [phone] [phone]     ID: [place] DOB: Apr 02, 1945  MR#: [id number]  VHQ#:[id number]  Patient Care Team: Unk Pinto, MD as PCP - General (Internal Medicine) Dian Queen, MD as Consulting Physician (Obstetrics and Gynecology) Clent Jacks, MD as Consulting Physician (Ophthalmology) Lindwood Coke, MD as Consulting Physician (Dermatology) Ellarie Picking, Virgie Dad, MD as Consulting Physician (Oncology) Coralie Keens, MD as Consulting Physician (General Surgery) Eppie Gibson, MD as Attending Physician (Radiation Oncology) [name], MD as Consulting Physician (Orthopedic Surgery) Mauro Kaufmann, RN as Oncology Nurse Navigator Rockwell Germany, RN as Oncology Nurse Bascom MD:  I connected with [place] on 04/29/20 at  2:30 PM EST by video enabled telemedicine visit and verified that I am speaking with the correct person using two identifiers.   I discussed the limitations, risks, security and privacy concerns of performing an evaluation and management service by telemedicine and the availability of in-person appointments. I also discussed with the patient that there may be a patient responsible charge related to this service. The patient expressed understanding and agreed to proceed.   Other persons participating in the visit and their role in the encounter: None  Patients location: Home Providers location: [place]  Total time spent: 15 min   CHIEF COMPLAINT: estrogen receptor positive breast cancer, phyllodes tumor  CURRENT TREATMENT:  tamoxifen   INTERVAL HISTORY: [place] was contacted today for follow up of her estrogen receptor positive breast cancer.  She started tamoxifen on 02/29/2020.  She is tolerating this generally well although she is having more hot flashes.  She says that her husband is having to overdressed in the house because she cannot  take off any more clothes.  The hot flashes are worse in the evening and she does have wine sometimes with supper which does not help.  Aside from this she is tolerating the medication with no other side effects.  She is scheduled for routine screening mammography on 05/25/2020.   REVIEW OF SYSTEMS: [place] was exercising regularly until the cold weather got in the way.  She is now getting back into it.  She has some discomfort in the surgical breast which we discussed at length.  A detailed review of systems was otherwise noncontributory   COVID 19 VACCINATION STATUS: Status post Pfizer x2, most recently March 2021   HISTORY OF CURRENT ILLNESS: From the original intake note:  K[place] herself palpated an upper-central left breast mass. She underwent left diagnostic mammography with tomography and left breast ultrasonography at [facility] on 05/24/2019 showing: breast density category C; 4.7 cm palpable suspicious left breast mass at 12:30; negative for lymphadenopathy.  Accordingly on 05/28/2019 she proceeded to biopsy of the left breast area in question. The pathology from this procedure ([id number]) showed: fibroadenoma.  This was felt to be discordant and excision was recommended.  She opted to proceed with left lumpectomy on 07/18/2019 under Dr. Ninfa Linden. Pathology [id number]) revealed: ductal carcinoma in situ, low-grade, arising in a fibroepithelial lesion, 3.9 cm; margins were clear for the ductal carcinoma in situ but positive for the fibroepithelial lesion; prognostic indicators for the ductal carcinoma in situ significant for: estrogen receptor, 95% positive and progesterone receptor, 95% positive, both with strong to moderate staining intensity.   The patient's subsequent history is as detailed below.   PAST MEDICAL HISTORY: Past Medical History:  Diagnosis Date   Allergy    Anxiety  Attention deficit disorder (ADD)    Cancer ([place])    ? basal/squam cell on  chest, and forehead   Depression    Dry eyes    GERD (gastroesophageal reflux disease)    diet controlled, no meds   HLD (hyperlipidemia)    HSV infection    Lumbar degenerative disc disease    Ocular rosacea    Osteopenia     PAST SURGICAL HISTORY: Past Surgical History:  Procedure Laterality Date   BREAST LUMPECTOMY Left 07/18/2019   Procedure: LEFT BREAST LUMPECTOMY;  Surgeon: Coralie Keens, MD;  Location: [place];  Service: General;  Laterality: Left;  LMA   COLONOSCOPY  2019   RE-EXCISION OF BREAST LUMPECTOMY Left 09/05/2019   Procedure: RE-EXCISION OF LEFT BREAST TUMOR;  Surgeon: Coralie Keens, MD;  Location: [place];  Service: General;  Laterality: Left;  LMA   SKIN BIOPSY     ?basal/squam cell on chest   TONSILLECTOMY AND ADENOIDECTOMY     TOTAL KNEE ARTHROPLASTY Right 03/04/2019   Procedure: RIGHT TOTAL KNEE ARTHROPLASTY;  Surgeon: [name], MD;  Location: [place];  Service: Orthopedics;  Laterality: Right;   UPPER GI ENDOSCOPY      FAMILY HISTORY: Family History  Problem Relation Age of Onset   Diabetes Father    Hypertension Mother    Depression Mother    Macular degeneration Mother    Heart disease Mother   The patient's father died at age 15 following a fall.  The patient's mother died at age 8 with heart disease problems.  The patient has 2 brothers no sisters.  1 brother has had his thyroid removed and may have had thyroid carcinoma; the second brother is a Teacher, music in [place].  There is no cancer in the family to the patient's knowledge.   GYNECOLOGIC HISTORY:  No LMP recorded (lmp unknown). Patient is postmenopausal. Menarche: 75 years old Age at first live birth: 75 years old [place] P 2 LMP 20 Hormone replacement; yes, more than 20 years Hysterectomy: no BSO? 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  Her husband of more than 50 years [place] ("[name]") works in finances.  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  The patient has 2 grandchildren.  She is a member of [employer]: In the absence of any documents to the contrary the patient's husband is her healthcare power of attorney   HEALTH MAINTENANCE: Social History   Tobacco Use   Smoking status: Former Smoker    Packs/day: 0.50    Years: 15.00    Pack years: 7.50    Types: Cigarettes    Quit date: 03/23/1980    Years since quitting: 40.1   Smokeless tobacco: Never Used  Vaping Use   Vaping Use: Never used  Substance Use Topics   Alcohol use: Yes    Alcohol/week: 0.0 standard drinks    Comment: social   Drug use: No     Colonoscopy: 06/2017 (Dr. Mayo Ao further colonoscopies planned  PAP: 2020 (Dr. Helane Rima)  Bone density: 2019 (at [facility])   Allergies  Allergen Reactions   Zetia [Ezetimibe] Diarrhea    Patient states she had IBS, that took a year to resolve.   Prednisone Nausea And Vomiting   Statins     paralyzing   Strattera [Atomoxetine Hcl]  Increased sadness    Current Outpatient Medications  Medication Sig Dispense Refill   acetaminophen (TYLENOL) 500 MG tablet Take 500 mg by mouth every 6 (six) hours as needed for moderate pain or headache.     amphetamine-dextroamphetamine (ADDERALL) 30 MG tablet Take 1/2 to 1 tablet 2 x /day as needed for Focus & Concentration 60 tablet 0   aspirin EC 81 MG tablet Take 81 mg by mouth daily. Swallow whole.     Cholecalciferol (DIALYVITE VITAMIN D 5000) 125 MCG (5000 UT) capsule Take 10,000 Units by mouth daily.     famciclovir (FAMVIR) 500 MG tablet Take 1 tablet (500 mg total) by mouth 2 (two) times daily as needed (fever blisters). 60 tablet 1   fluticasone (FLONASE) 50 MCG/ACT nasal spray Use      1 to 2 sprays       to each Nostril        at Bedtime 48 g 3    L-Tyrosine 500 MG CAPS Take 500 mg by mouth daily. (Patient not taking: Reported on 01/06/2020)     loratadine (CLARITIN) 10 MG tablet Take 10 mg by mouth daily as needed for allergies.     Olopatadine HCl (PATADAY OP) Place 1 drop into both eyes daily as needed (dry eyes).     OVER THE COUNTER MEDICATION Take 550 mg by mouth daily. [place] Mushroom otc supplement (Patient not taking: Reported on 01/06/2020)     venlafaxine XR (EFFEXOR-XR) 37.5 MG 24 hr capsule Take 1 capsule (37.5 mg total) by mouth daily with breakfast. 60 capsule 6   Vitamin E Skin OIL Apply 1 application topically daily as needed (scarring).     No current facility-administered medications for this visit.    OBJECTIVE:   There were no vitals filed for this visit.   There is no height or weight on file to calculate BMI.   Wt Readings from Last 3 Encounters:  01/15/20 197 lb 4.8 oz (89.5 kg)  01/06/20 199 lb (90.3 kg)  12/25/19 192 lb (87.1 kg)      ECOG FS:1 - Symptomatic but completely ambulatory  Telemedicine visit 04/30/2020   LAB RESULTS:  CMP     Component Value Date/Time   NA 141 11/18/2019 1012   K 4.4 11/18/2019 1012   CL 104 11/18/2019 1012   CO2 30 11/18/2019 1012   GLUCOSE 102 (H) 11/18/2019 1012   BUN 23 11/18/2019 1012   CREATININE 0.86 11/18/2019 1012   CALCIUM 9.8 11/18/2019 1012   PROT 6.4 11/18/2019 1012   ALBUMIN 3.9 08/07/2019 1518   AST 12 11/18/2019 1012   AST 17 08/07/2019 1518   ALT 13 11/18/2019 1012   ALT 18 08/07/2019 1518   ALKPHOS 78 08/07/2019 1518   BILITOT 0.4 11/18/2019 1012   BILITOT 0.4 08/07/2019 1518   GFRNONAA 68 11/18/2019 1012   GFRAA 79 11/18/2019 1012    No results found for: TOTALPROTELP, ALBUMINELP, A1GS, A2GS, BETS, BETA2SER, GAMS, MSPIKE, SPEI  Lab Results  Component Value Date   WBC 4.9 11/18/2019   NEUTROABS 3,376 11/18/2019   HGB 14.1 11/18/2019   HCT 41.8 11/18/2019   MCV 96.8 11/18/2019   PLT 188 11/18/2019    No results found for:  LABCA2  No components found for: NFAOZH086  No results for input(s): INR in the last 168 hours.  No results found for: LABCA2  No results found for: VHQ469  No results found for: GEX528  No results found for: UXL244  No results found for: CA2729  No components found for: HGQUANT  No results found for: CEA1 / No results found for: CEA1   No results found for: AFPTUMOR  No results found for: CHROMOGRNA  No results found for: KPAFRELGTCHN, LAMBDASER, KAPLAMBRATIO (kappa/lambda light chains)  No results found for: HGBA, HGBA2QUANT, HGBFQUANT, HGBSQUAN (Hemoglobinopathy evaluation)   No results found for: LDH  Lab Results  Component Value Date   IRON 114 04/05/2017   TIBC 332 04/05/2017   IRONPCTSAT 34 04/05/2017   (Iron and TIBC)  Lab Results  Component Value Date   FERRITIN 166 04/04/2016    Urinalysis    Component Value Date/Time   COLORURINE YELLOW 02/20/2019 1332   APPEARANCEUR HAZY (A) 02/20/2019 1332   LABSPEC >1.030 (H) 02/20/2019 1332   PHURINE 5.5 02/20/2019 1332   GLUCOSEU NEGATIVE 02/20/2019 1332   HGBUR NEGATIVE 02/20/2019 1332   BILIRUBINUR NEGATIVE 02/20/2019 1332   KETONESUR NEGATIVE 02/20/2019 1332   PROTEINUR NEGATIVE 02/20/2019 1332   UROBILINOGEN 0.2 12/31/2012 1151   NITRITE NEGATIVE 02/20/2019 1332   LEUKOCYTESUR NEGATIVE 02/20/2019 1332    STUDIES: No results found.   ELIGIBLE FOR AVAILABLE RESEARCH PROTOCOL: AET  ASSESSMENT: 75 y.o. [place] woman status post left lumpectomy 07/26/2019 for ductal carcinoma in situ, estrogen receptor positive, arising in a phyllodes tumor [grading of the lesion is difficult since it has some features of a benign lesion (i.e., <5 mitoses/10 HPF) and some features of a borderline lesion (particularly the irregular border)]   (a) the ductal carcinoma in situ is low-grade, does not involve the margins, and is strongly estrogen and progesterone receptor positive  (b) margins are broadly positive for  the fibroepithelial lesion  (c) additional surgery 09/05/2019 for margin clearance found no residual phyllodes tumor or DCIS  (1) adjuvant radiation 10/14/2019-11/11/2019 Site Technique Total Dose (Gy) Dose per Fx (Gy) Completed Fx Beam Energies  Breast, Left: Breast_Lt 3D 40.05/40.05 2.67 15/15 6X, 10X  Breast, Left: Breast_Lt_Bst 3D 10/10 2 5/5 6X, 10X   (2) started tamoxifen 02/29/2020   PLAN: I [place] is tolerating tamoxifen well and the plan will be to continue that a minimum of 5 years.  She is on venlafaxine 37.5 mg daily for the hot flashes.  That is helping some.  I let her know I would be fine if she wanted to try doubling the dose for a couple of weeks and see if that makes a difference.  At this point she does not think she needs to do that.  She is already scheduled for mammography in March.  I will see her in April her and at that point we will start routine follow-up per protocol  She knows to call for any other issue that may develop before the next visit   Sarajane Jews C. Kiyo Heal, MD 04/29/2020 2:58 PM Medical Oncology and Hematology [facility] [place], [place] [address] Tel. [phone]    Fax. [phone]   This document serves as a record of services personally performed by Lurline Del, MD. It was created on his behalf by Wilburn Mylar, a trained medical scribe. The creation of this record is based on the scribe's personal observations and the provider's statements to them.   I, Lurline Del MD, have reviewed the above documentation for accuracy and completeness, and I agree with the above.   *Total Encounter Time as defined by the Centers for Medicare and Medicaid Services includes, in addition to the face-to-face time of a patient visit (  documented in the note above) non-face-to-face time: obtaining and reviewing outside history, ordering and reviewing medications, tests or procedures, care coordination (communications with  other health care professionals or caregivers) and documentation in the medical record.

## 2020-04-30 ENCOUNTER — Inpatient Hospital Stay: Payer: Medicare Other | Attending: Oncology | Admitting: Oncology

## 2020-04-30 DIAGNOSIS — D0512 Intraductal carcinoma in situ of left breast: Secondary | ICD-10-CM | POA: Diagnosis not present

## 2020-04-30 DIAGNOSIS — Z7982 Long term (current) use of aspirin: Secondary | ICD-10-CM | POA: Insufficient documentation

## 2020-04-30 DIAGNOSIS — Z170 Estrogen receptor positive status [ER+]: Secondary | ICD-10-CM | POA: Insufficient documentation

## 2020-04-30 DIAGNOSIS — M8580 Other specified disorders of bone density and structure, unspecified site: Secondary | ICD-10-CM | POA: Insufficient documentation

## 2020-04-30 DIAGNOSIS — C50112 Malignant neoplasm of central portion of left female breast: Secondary | ICD-10-CM | POA: Insufficient documentation

## 2020-04-30 DIAGNOSIS — Z87891 Personal history of nicotine dependence: Secondary | ICD-10-CM | POA: Insufficient documentation

## 2020-04-30 DIAGNOSIS — Z79899 Other long term (current) drug therapy: Secondary | ICD-10-CM | POA: Insufficient documentation

## 2020-05-01 DIAGNOSIS — H2513 Age-related nuclear cataract, bilateral: Secondary | ICD-10-CM | POA: Diagnosis not present

## 2020-05-01 DIAGNOSIS — H353131 Nonexudative age-related macular degeneration, bilateral, early dry stage: Secondary | ICD-10-CM | POA: Diagnosis not present

## 2020-05-01 DIAGNOSIS — H43812 Vitreous degeneration, left eye: Secondary | ICD-10-CM | POA: Diagnosis not present

## 2020-05-05 ENCOUNTER — Encounter: Payer: Medicare Other | Admitting: Physician Assistant

## 2020-05-07 ENCOUNTER — Encounter: Payer: Self-pay | Admitting: Adult Health Nurse Practitioner

## 2020-05-07 ENCOUNTER — Other Ambulatory Visit: Payer: Self-pay

## 2020-05-07 ENCOUNTER — Ambulatory Visit (INDEPENDENT_AMBULATORY_CARE_PROVIDER_SITE_OTHER): Payer: Medicare Other | Admitting: Adult Health Nurse Practitioner

## 2020-05-07 VITALS — BP 112/84 | HR 56 | Temp 97.5°F | Ht 61.5 in | Wt 202.4 lb

## 2020-05-07 DIAGNOSIS — E538 Deficiency of other specified B group vitamins: Secondary | ICD-10-CM | POA: Diagnosis not present

## 2020-05-07 DIAGNOSIS — E785 Hyperlipidemia, unspecified: Secondary | ICD-10-CM | POA: Diagnosis not present

## 2020-05-07 DIAGNOSIS — R0989 Other specified symptoms and signs involving the circulatory and respiratory systems: Secondary | ICD-10-CM | POA: Diagnosis not present

## 2020-05-07 DIAGNOSIS — Z0000 Encounter for general adult medical examination without abnormal findings: Secondary | ICD-10-CM

## 2020-05-07 DIAGNOSIS — Z136 Encounter for screening for cardiovascular disorders: Secondary | ICD-10-CM

## 2020-05-07 DIAGNOSIS — D4860 Neoplasm of uncertain behavior of unspecified breast: Secondary | ICD-10-CM

## 2020-05-07 DIAGNOSIS — T7840XD Allergy, unspecified, subsequent encounter: Secondary | ICD-10-CM

## 2020-05-07 DIAGNOSIS — E6609 Other obesity due to excess calories: Secondary | ICD-10-CM

## 2020-05-07 DIAGNOSIS — I10 Essential (primary) hypertension: Secondary | ICD-10-CM

## 2020-05-07 DIAGNOSIS — E559 Vitamin D deficiency, unspecified: Secondary | ICD-10-CM

## 2020-05-07 DIAGNOSIS — Z0001 Encounter for general adult medical examination with abnormal findings: Secondary | ICD-10-CM

## 2020-05-07 DIAGNOSIS — R7309 Other abnormal glucose: Secondary | ICD-10-CM

## 2020-05-07 DIAGNOSIS — D649 Anemia, unspecified: Secondary | ICD-10-CM

## 2020-05-07 DIAGNOSIS — F988 Other specified behavioral and emotional disorders with onset usually occurring in childhood and adolescence: Secondary | ICD-10-CM

## 2020-05-07 DIAGNOSIS — L718 Other rosacea: Secondary | ICD-10-CM

## 2020-05-07 DIAGNOSIS — M159 Polyosteoarthritis, unspecified: Secondary | ICD-10-CM

## 2020-05-07 DIAGNOSIS — M150 Primary generalized (osteo)arthritis: Secondary | ICD-10-CM

## 2020-05-07 DIAGNOSIS — M8949 Other hypertrophic osteoarthropathy, multiple sites: Secondary | ICD-10-CM

## 2020-05-07 DIAGNOSIS — D0512 Intraductal carcinoma in situ of left breast: Secondary | ICD-10-CM

## 2020-05-07 DIAGNOSIS — E66811 Other obesity due to excess calories: Secondary | ICD-10-CM

## 2020-05-07 DIAGNOSIS — M1711 Unilateral primary osteoarthritis, right knee: Secondary | ICD-10-CM

## 2020-05-07 DIAGNOSIS — Z1389 Encounter for screening for other disorder: Secondary | ICD-10-CM

## 2020-05-07 DIAGNOSIS — Z6832 Body mass index (BMI) 32.0-32.9, adult: Secondary | ICD-10-CM

## 2020-05-07 DIAGNOSIS — F325 Major depressive disorder, single episode, in full remission: Secondary | ICD-10-CM

## 2020-05-07 DIAGNOSIS — B009 Herpesviral infection, unspecified: Secondary | ICD-10-CM

## 2020-05-07 DIAGNOSIS — M8580 Other specified disorders of bone density and structure, unspecified site: Secondary | ICD-10-CM

## 2020-05-07 NOTE — Progress Notes (Signed)
COMPLETE PHYSICAL  Assessment:   Encounter for routine medical examination with abnormal findings Yearly  Depression, major, in remission ([place]) - continue medications, stress management techniques discussed, increase water, good sleep hygiene discussed, increase exercise, and increase veggies.   Phyllodes tumor of breast S/p lumpectomy with radiation  Ductal carcinoma in situ (DCIS) of left breast S/p lumpectomy with radiation  Labile hypertension - continue medications, DASH diet, exercise and monitor at home. Call if greater than 130/80.   Hyperlipidemia, unspecified hyperlipidemia type check lipids- not tolerate of statin/zetia Will get cardiac calcium score if elevated will refer to lipid clinic decrease fatty foods increase activity.   Vitamin D deficiency Continue supplement  Anemia, unspecified type - monitor, continue iron supp with Vitamin C and increase green leafy veggies  Osteopenia, unspecified location Monitor  Ocular rosacea Monitor  Class 1 obesity due to excess calories with serious comorbidity and body mass index (BMI) of 32.0 to 32.9 in adult - follow up 3 months for progress monitoring - increase veggies, decrease carbs - long discussion about weight loss, diet, and exercise  Attention deficit disorder, unspecified hyperactivity presence  Gastroesophageal reflux disease, unspecified whether esophagitis present Continue PPI/H2 blocker, diet discussed  Allergy, subsequent encounter Allergic rhinitis - Allegra OTC, increase H20, allergy hygiene explained.  HSV infection Monitor  Primary osteoarthritis involving multiple joints Arthritis of right knee Continue follow up ortho  Medication management Continued  Screening for blood or protein in urine -     Urinalysis w microscopic + reflex cultur -     EKG 12-Lead  Abnormal glucose -     Hemoglobin A1c  B12 deficiency -     Vitamin B12  Other orders -     REFLEXIVE URINE  CULTURE     Further disposition pending results if labs check today. Discussed med's effects and SE's.   Over 30 minutes of face to face interview, exam, counseling, chart review, and critical decision making was performed.    Future Appointments  Date Time Provider [place]  05/25/2020  9:20 AM [facility] MM 2 [facility]  06/17/2020 11:00 AM Magrinat, Virgie Dad, MD [facility] None  05/10/2021  3:00 PM Garnet Sierras, NP [facility] None     Subjective:   Kathy Cook is a 75 y.o. female who presents for complete physical and follow up on HTN, HLD, vitamin D def and weight.   She reports overall she is doing well.  She does not have any health or medication concerns today.  BMI is Body mass index is 37.62 kg/m., she is working on diet and exercise. Wt Readings from Last 3 Encounters:  05/07/20 202 lb 6.4 oz (91.8 kg)  01/15/20 197 lb 4.8 oz (89.5 kg)  01/06/20 199 lb (90.3 kg)   She is S/p lumpectomy with radiation on her left breast, no longer on hormones due to DC in situ, started on effexor 37.5mg  by Dr. Griffith Citron. Last follow up 04/30/20.   She does well on the Adderall, helps with concentration however has not been taking recently.  Has HSV 1 and has famcyclovir for occ out break. Has not had to use recently, no refill needed.  Her blood pressure has been controlled at home, today their BP is BP: 112/84 She does workout, walks. She denies chest pain, shortness of breath, dizziness.  She is not on cholesterol medication, can not tolerate statins and could not tolerate zetia due to diarrhea. No family history.  Her cholesterol is not at goal. The  cholesterol last visit was:   Lab Results  Component Value Date   CHOL 332 (H) 11/18/2019   HDL 78 11/18/2019   LDLCALC 225 (H) 11/18/2019   TRIG 137 11/18/2019   CHOLHDL 4.3 11/18/2019   Last A1C in the office was:  Lab Results  Component Value Date   HGBA1C 5.5 08/16/2019   Patient is on Vitamin D  supplement. Lab Results  Component Value Date   VD25OH 51 08/16/2019    Names of Other Physician/Practitioners you currently use: 1. [facility]- here for primary care 2. [place], eye doctor, 04/2020 3.  Dr. Johnnye Sima, dentist, last visit q 6 months Patient Care Team: Unk Pinto, MD as PCP - General (Internal Medicine) Dian Queen, MD as Consulting Physician (Obstetrics and Gynecology) Clent Jacks, MD as Consulting Physician (Ophthalmology) Lindwood Coke, MD as Consulting Physician (Dermatology) Magrinat, Virgie Dad, MD as Consulting Physician (Oncology) Coralie Keens, MD as Consulting Physician (General Surgery) Eppie Gibson, MD as Attending Physician (Radiation Oncology) [name], MD as Consulting Physician (Orthopedic Surgery) Mauro Kaufmann, RN as Oncology Nurse Navigator Rockwell Germany, RN as Oncology Nurse Navigator  Medication Review    Current Outpatient Medications (Respiratory):  .  fluticasone (FLONASE) 50 MCG/ACT nasal spray, Use      1 to 2 sprays       to each Nostril        at Bedtime .  loratadine (CLARITIN) 10 MG tablet, Take 10 mg by mouth daily as needed for allergies.  Current Outpatient Medications (Analgesics):  .  acetaminophen (TYLENOL) 500 MG tablet, Take 500 mg by mouth every 6 (six) hours as needed for moderate pain or headache. Marland Kitchen  aspirin EC 81 MG tablet, Take 81 mg by mouth daily. Swallow whole.   Current Outpatient Medications (Other):  .  amphetamine-dextroamphetamine (ADDERALL) 30 MG tablet, Take 1/2 to 1 tablet 2 x /day as needed for Focus & Concentration .  Cholecalciferol (DIALYVITE VITAMIN D 5000) 125 MCG (5000 UT) capsule, Take 10,000 Units by mouth daily. .  famciclovir (FAMVIR) 500 MG tablet, Take 1 tablet (500 mg total) by mouth 2 (two) times daily as needed (fever blisters). Marland Kitchen  L-Tyrosine 500 MG CAPS, Take 500 mg by mouth daily. Marland Kitchen  OVER THE COUNTER MEDICATION, Take 550 mg by  mouth daily. [place] Mushroom otc supplement .  tamoxifen (NOLVADEX) 20 MG tablet,  .  venlafaxine XR (EFFEXOR-XR) 37.5 MG 24 hr capsule, Take 1 capsule (37.5 mg total) by mouth daily with breakfast. .  Vitamin E Skin OIL, Apply 1 application topically daily as needed (scarring).  Current Problems (verified) Patient Active Problem List   Diagnosis Date Noted  . Phyllodes tumor of breast 08/07/2019  . Ductal carcinoma in situ (DCIS) of left breast 08/06/2019  . Status post total hip replacement, right 03/05/2019  . Arthritis of right knee 03/04/2019  . Unilateral primary osteoarthritis, right knee 02/05/2019  . Knee locking, right 01/09/2019  . Metatarsal stress fracture, right, initial encounter 10/03/2016  . DJD (degenerative joint disease) 06/09/2016  . Labile hypertension 08/01/2014  . Vitamin D deficiency 08/01/2014  . Medication management 08/01/2014  . Obesity 07/04/2014  . Hyperlipemia 06/27/2013  . Attention deficit disorder (ADD)   . GERD (gastroesophageal reflux disease)   . Allergy   . Depression, major, in remission ([place])   . Anemia   . Ocular rosacea   . HSV infection   . Osteopenia     Screening Tests Immunization  History  Administered Date(s) Administered  . DT (Pediatric) 01/14/2015  . Influenza, High Dose Seasonal PF 04/30/2018, 11/13/2018, 01/06/2020  . PFIZER(Purple Top)SARS-COV-2 Vaccination 04/07/2019, 05/01/2019  . Pneumococcal Conjugate-13 03/24/2015  . Pneumococcal-Unspecified 12/31/2012  . Td 03/29/2004    Preventative care: Last colonoscopy: 06/2017 last colonoscopy per Dr. Collene Mares Last mammogram: 07/2019  at [facility]  Last pap smear/pelvic exam: 2020 at [facility] Dr. Tressia Danas DEXA 2019 getting this [id number] at [facility]  Prior vaccinations: TD or Tdap: 2016  Influenza: 2021 Pneumococcal: 2014 Prevnar 13: 2017 Shingles/Zostavax: DUE  Allergies Allergies  Allergen Reactions  . Zetia [Ezetimibe] Diarrhea    Patient states she had IBS, that took a  year to resolve.  . Prednisone Nausea And Vomiting  . Statins     paralyzing  . Strattera [Atomoxetine Hcl]     Increased sadness    SURGICAL HISTORY She  has a past surgical history that includes Tonsillectomy and adenoidectomy; Skin biopsy; Total knee arthroplasty (Right, 03/04/2019); Breast lumpectomy (Left, 07/18/2019); Colonoscopy (2019); Upper gi endoscopy; and Re-excision of breast lumpectomy (Left, 09/05/2019). FAMILY HISTORY Her family history includes Depression in her mother; Diabetes in her father; Heart disease in her mother; Hypertension in her mother; Macular degeneration in her mother. SOCIAL HISTORY She  reports that she quit smoking about 40 years ago. Her smoking use included cigarettes. She has a 7.50 pack-year smoking history. She has never used smokeless tobacco. She reports current alcohol use. She reports that she does not use drugs.  Review of Systems  Constitutional: Negative.  Negative for chills, diaphoresis, fever, malaise/fatigue and weight loss.  HENT: Negative.  Negative for congestion, ear discharge, ear pain, hearing loss, nosebleeds, sinus pain, sore throat and tinnitus.   Eyes: Negative.  Negative for blurred vision, double vision, photophobia, pain, discharge and redness.  Respiratory: Negative.  Negative for cough, hemoptysis, sputum production, shortness of breath, wheezing and stridor.   Cardiovascular: Negative.  Negative for chest pain, palpitations, orthopnea, claudication, leg swelling and PND.  Gastrointestinal: Negative.  Negative for abdominal pain, blood in stool, constipation, diarrhea, heartburn, melena, nausea and vomiting.  Genitourinary: Negative.  Negative for dysuria, flank pain, frequency, hematuria and urgency.  Musculoskeletal: Negative.  Negative for back pain, falls, joint pain, myalgias and neck pain.  Skin: Negative.  Negative for itching and rash.  Neurological: Negative for dizziness, tingling, tremors, sensory change, speech change,  focal weakness, seizures, loss of consciousness, weakness and headaches.  Endo/Heme/Allergies: Negative for environmental allergies and polydipsia. Does not bruise/bleed easily.  Psychiatric/Behavioral: Negative for depression, hallucinations, memory loss, substance abuse and suicidal ideas. The patient is not nervous/anxious and does not have insomnia.      Objective:   Blood pressure 112/84, pulse (!) 56, temperature (!) 97.5 F (36.4 C), height 5' 1.5" (1.562 m), weight 202 lb 6.4 oz (91.8 kg), SpO2 99 %. Body mass index is 37.62 kg/m.  General appearance: alert, no distress, WD/WN,  female HEENT: normocephalic, sclerae anicteric, pupils dilated at this time due to recent eye exam TMs pearly, nares patent, no discharge or erythema, pharynx normal Oral cavity: MMM, no lesions Neck: supple, no lymphadenopathy, no thyromegaly, no masses Heart: RRR, normal S1, S2, no murmurs Lungs: CTA bilaterally, no wheezes, rhonchi, or rales Abdomen: +bs, soft, non tender, non distended, no masses, no hepatomegaly, no splenomegaly Musculoskeletal: nontender, no swelling, no obvious deformity Extremities: no edema, no cyanosis, no clubbing Pulses: 2+ symmetric, upper and lower extremities, normal cap refill Neurological: alert, oriented x 3, CN2-12 intact, strength normal  upper extremities and lower extremities, sensation normal throughout, DTRs 2+ throughout, no cerebellar signs, gait normal Psychiatric: normal affect, behavior normal, pleasant  Breast: Left breast with large 2-3 cm mobile non tender mass at 12 oclock.  Gyn: defer Rectal: defer   Bayard Males, DNP [facility] 05/07/2020  4:02 PM

## 2020-05-08 LAB — URINALYSIS W MICROSCOPIC + REFLEX CULTURE
Bacteria, UA: NONE SEEN /HPF
Bilirubin Urine: NEGATIVE
Glucose, UA: NEGATIVE
Hgb urine dipstick: NEGATIVE
Hyaline Cast: NONE SEEN /LPF
Ketones, ur: NEGATIVE
Leukocyte Esterase: NEGATIVE
Nitrites, Initial: NEGATIVE
Protein, ur: NEGATIVE
RBC / HPF: NONE SEEN /HPF (ref 0–2)
Specific Gravity, Urine: 1.023 (ref 1.001–1.03)
Squamous Epithelial / HPF: NONE SEEN /HPF (ref <=5)
WBC, UA: NONE SEEN /HPF (ref 0–5)
pH: 5.5 (ref 5.0–8.0)

## 2020-05-08 LAB — HEMOGLOBIN A1C
Hgb A1c MFr Bld: 5.6 % of total Hgb (ref <5.7)
Mean Plasma Glucose: 114 mg/dL
eAG (mmol/L): 6.3 mmol/L

## 2020-05-08 LAB — COMPLETE METABOLIC PANEL WITH GFR
AG Ratio: 2 (calc) (ref 1.0–2.5)
ALT: 10 U/L (ref 6–29)
AST: 13 U/L (ref 10–35)
Albumin: 4.3 g/dL (ref 3.6–5.1)
Alkaline phosphatase (APISO): 50 U/L (ref 37–153)
BUN: 20 mg/dL (ref 7–25)
CO2: 27 mmol/L (ref 20–32)
Calcium: 9.4 mg/dL (ref 8.6–10.4)
Chloride: 105 mmol/L (ref 98–110)
Creat: 0.91 mg/dL (ref 0.60–0.93)
GFR, Est African American: 73 mL/min/{1.73_m2} (ref >=60)
GFR, Est Non African American: 63 mL/min/{1.73_m2} (ref >=60)
Globulin: 2.2 g/dL (calc) (ref 1.9–3.7)
Glucose, Bld: 95 mg/dL (ref 65–99)
Potassium: 4.6 mmol/L (ref 3.5–5.3)
Sodium: 142 mmol/L (ref 135–146)
Total Bilirubin: 0.3 mg/dL (ref 0.2–1.2)
Total Protein: 6.5 g/dL (ref 6.1–8.1)

## 2020-05-08 LAB — NO CULTURE INDICATED

## 2020-05-08 LAB — CBC WITH DIFFERENTIAL/PLATELET
Absolute Monocytes: 597 cells/uL (ref 200–950)
Basophils Absolute: 41 cells/uL (ref 0–200)
Basophils Relative: 0.7 %
Eosinophils Absolute: 139 cells/uL (ref 15–500)
Eosinophils Relative: 2.4 %
HCT: 41.2 % (ref 35.0–45.0)
Hemoglobin: 13.7 g/dL (ref 11.7–15.5)
Lymphs Abs: 1102 cells/uL (ref 850–3900)
MCH: 32.2 pg (ref 27.0–33.0)
MCHC: 33.3 g/dL (ref 32.0–36.0)
MCV: 96.7 fL (ref 80.0–100.0)
MPV: 10.9 fL (ref 7.5–12.5)
Monocytes Relative: 10.3 %
Neutro Abs: 3921 cells/uL (ref 1500–7800)
Neutrophils Relative %: 67.6 %
Platelets: 201 10*3/uL (ref 140–400)
RBC: 4.26 10*6/uL (ref 3.80–5.10)
RDW: 12.1 % (ref 11.0–15.0)
Total Lymphocyte: 19 %
WBC: 5.8 10*3/uL (ref 3.8–10.8)

## 2020-05-08 LAB — LIPID PANEL
Cholesterol: 258 mg/dL — ABNORMAL HIGH (ref <200)
HDL: 85 mg/dL (ref >=50)
LDL Cholesterol (Calc): 136 mg/dL (calc) — ABNORMAL HIGH
Non-HDL Cholesterol (Calc): 173 mg/dL (calc) — ABNORMAL HIGH (ref <130)
Total CHOL/HDL Ratio: 3 (calc) (ref <5.0)
Triglycerides: 220 mg/dL — ABNORMAL HIGH (ref <150)

## 2020-05-08 LAB — VITAMIN B12: Vitamin B-12: 346 pg/mL (ref 200–1100)

## 2020-05-08 LAB — TSH: TSH: 2.7 mIU/L (ref 0.40–4.50)

## 2020-05-08 LAB — VITAMIN D 25 HYDROXY (VIT D DEFICIENCY, FRACTURES): Vit D, 25-Hydroxy: 48 ng/mL (ref 30–100)

## 2020-05-17 DIAGNOSIS — K600 Acute anal fissure: Secondary | ICD-10-CM | POA: Insufficient documentation

## 2020-05-25 ENCOUNTER — Ambulatory Visit: Payer: Medicare Other

## 2020-06-09 ENCOUNTER — Other Ambulatory Visit: Payer: Self-pay | Admitting: Oncology

## 2020-06-09 ENCOUNTER — Other Ambulatory Visit: Payer: Self-pay | Admitting: Internal Medicine

## 2020-06-09 DIAGNOSIS — Z853 Personal history of malignant neoplasm of breast: Secondary | ICD-10-CM

## 2020-06-12 ENCOUNTER — Ambulatory Visit
Admission: RE | Admit: 2020-06-12 | Discharge: 2020-06-12 | Disposition: A | Discharge status: Home / Self Care | Payer: Medicare Other | Source: Ambulatory Visit | Attending: Internal Medicine | Admitting: Internal Medicine

## 2020-06-12 ENCOUNTER — Other Ambulatory Visit: Payer: Self-pay

## 2020-06-12 DIAGNOSIS — R922 Inconclusive mammogram: Secondary | ICD-10-CM | POA: Diagnosis not present

## 2020-06-12 DIAGNOSIS — Z853 Personal history of malignant neoplasm of breast: Secondary | ICD-10-CM

## 2020-06-16 NOTE — Progress Notes (Signed)
[place]  [phone] [phone]     ID: KYIA RHUDE DOB: [birth year]  MR#: [id number]  VFI#:[id number]  Patient Care Team: Unk Pinto, MD as PCP - General (Internal Medicine) Dian Queen, MD as Consulting Physician (Obstetrics and Gynecology) Clent Jacks, MD as Consulting Physician (Ophthalmology) Lindwood Coke, MD as Consulting Physician (Dermatology) Anias Bartol, Virgie Dad, MD as Consulting Physician (Oncology) Coralie Keens, MD as Consulting Physician (General Surgery) Eppie Gibson, MD as Attending Physician (Radiation Oncology) [name], MD as Consulting Physician (Orthopedic Surgery) Mauro Kaufmann, RN as Oncology Nurse Navigator Rockwell Germany, RN as Oncology Nurse Navigator Chauncey Cruel, MD OTHER MD:   CHIEF COMPLAINT: estrogen receptor positive breast cancer, phyllodes tumor  CURRENT TREATMENT:  tamoxifen   INTERVAL HISTORY: Avelynn returns today for follow up of her estrogen receptor positive breast cancer.  She started tamoxifen on 02/29/2020.  She is tolerating this generally well although she is still having hot flashes.  She keeps the temperature at night and the 65-68 range and is sleeping much better as a result.    Since her last visit, she underwent bilateral diagnostic mammography with tomography at [facility] on 06/12/2020 showing: breast density category C; no evidence of malignancy in either breast.    REVIEW OF SYSTEMS: Kayla tells me her right knee is doing better and she is planning trips through the [place] group, in fact leaving for [place] this coming weekend.  She is exercising more regularly.  She walks at least 5 times a week, at least 20 to 30 minutes.  A detailed review of systems was otherwise stable.   COVID 19 VACCINATION STATUS: Status post Pfizer x2, most recently March 2021   HISTORY OF CURRENT ILLNESS: From the original intake note:  SHAILYNN FONG herself palpated  an upper-central left breast mass. She underwent left diagnostic mammography with tomography and left breast ultrasonography at [facility] on 05/24/2019 showing: breast density category C; 4.7 cm palpable suspicious left breast mass at 12:30; negative for lymphadenopathy.  Accordingly on 05/28/2019 she proceeded to biopsy of the left breast area in question. The pathology from this procedure ([id number]) showed: fibroadenoma.  This was felt to be discordant and excision was recommended.  She opted to proceed with left lumpectomy on 07/18/2019 under Dr. Ninfa Linden. Pathology [id number]) revealed: ductal carcinoma in situ, low-grade, arising in a fibroepithelial lesion, 3.9 cm; margins were clear for the ductal carcinoma in situ but positive for the fibroepithelial lesion; prognostic indicators for the ductal carcinoma in situ significant for: estrogen receptor, 95% positive and progesterone receptor, 95% positive, both with strong to moderate staining intensity.   The patient's subsequent history is as detailed below.   PAST MEDICAL HISTORY: Past Medical History:  Diagnosis Date  . Allergy   . Anxiety   . Attention deficit disorder (ADD)   . Cancer [facility])    ? basal/squam cell on chest, and forehead  . Depression   . Dry eyes   . GERD (gastroesophageal reflux disease)    diet controlled, no meds  . HLD (hyperlipidemia)   . HSV infection   . Lumbar degenerative disc disease   . Ocular rosacea   . Osteopenia     PAST SURGICAL HISTORY: Past Surgical History:  Procedure Laterality Date  . BREAST LUMPECTOMY Left 07/18/2019   Procedure: LEFT BREAST LUMPECTOMY;  Surgeon: Coralie Keens, MD;  Location: [facility];  Service: General;  Laterality: Left;  LMA  . COLONOSCOPY  2019  . RE-EXCISION OF BREAST LUMPECTOMY Left 09/05/2019   Procedure: RE-EXCISION OF LEFT BREAST TUMOR;  Surgeon: Coralie Keens, MD;  Location: [facility];  Service: General;  Laterality: Left;  LMA  .  SKIN BIOPSY     ?basal/squam cell on chest  . TONSILLECTOMY AND ADENOIDECTOMY    . TOTAL KNEE ARTHROPLASTY Right 03/04/2019   Procedure: RIGHT TOTAL KNEE ARTHROPLASTY;  Surgeon: [name], MD;  Location: [facility];  Service: Orthopedics;  Laterality: Right;  . UPPER GI ENDOSCOPY      FAMILY HISTORY: Family History  Problem Relation Age of Onset  . Diabetes Father   . Hypertension Mother   . Depression Mother   . Macular degeneration Mother   . Heart disease Mother   The patient's father died at age 26 following a fall.  The patient's mother died at age 86 with heart disease problems.  The patient has 2 brothers no sisters.  1 brother has had his thyroid removed and may have had thyroid carcinoma; the second brother is a Teacher, music in [place].  There is no cancer in the family to the patient's knowledge.   GYNECOLOGIC HISTORY:  No LMP recorded (lmp unknown). Patient is postmenopausal. Menarche: 75 years old Age at first live birth: 75 years old [place] P 2 LMP 77 Hormone replacement; yes, more than 20 years Hysterectomy: no BSO? 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  Her husband of more than 50 years [name] ("[name]") works in finances.  Son Harrell Gave runs a business in [place].  Daughter Benjamine Mola is an attorney planning to move to [place] soon.  The patient has 2 grandchildren.  She is a member of [employer]: In the absence of any documents to the contrary the patient's husband is her healthcare power of attorney   HEALTH MAINTENANCE: Social History   Tobacco Use  . Smoking status: Former Smoker    Packs/day: 0.50    Years: 15.00    Pack years: 7.50    Types: Cigarettes    Quit date: 03/23/1980    Years since quitting: 40.2  . Smokeless tobacco: Never Used  Vaping Use  . Vaping Use: Never used  Substance Use Topics  . Alcohol use: Yes    Alcohol/week: 0.0  standard drinks    Comment: social  . Drug use: No     Colonoscopy: 06/2017 (Dr. Mayo Ao further colonoscopies planned  PAP: 2020 (Dr. Helane Rima)  Bone density: 2019 (at [facility])   Allergies  Allergen Reactions  . Zetia [Ezetimibe] Diarrhea    Patient states she had IBS, that took a year to resolve.  . Prednisone Nausea And Vomiting  . Statins     paralyzing  . Strattera [Atomoxetine Hcl]     Increased sadness    Current Outpatient Medications  Medication Sig Dispense Refill  . acetaminophen (TYLENOL) 500 MG tablet Take 500 mg by mouth every 6 (six) hours as needed for moderate pain or headache.    . amphetamine-dextroamphetamine (ADDERALL) 30 MG tablet Take 1/2 to 1 tablet 2 x /day as needed for Focus & Concentration 60 tablet 0  . aspirin EC 81 MG tablet Take 81 mg by mouth daily. Swallow whole.    . Cholecalciferol (DIALYVITE VITAMIN D 5000) 125 MCG (5000 UT) capsule Take 10,000 Units by mouth daily.    . famciclovir (FAMVIR) 500 MG tablet Take 1 tablet (500 mg  total) by mouth 2 (two) times daily as needed (fever blisters). 60 tablet 1  . fluticasone (FLONASE) 50 MCG/ACT nasal spray Use      1 to 2 sprays       to each Nostril        at Bedtime 48 g 3  . loratadine (CLARITIN) 10 MG tablet Take 10 mg by mouth daily as needed for allergies.    Marland Kitchen OVER THE COUNTER MEDICATION Take 550 mg by mouth daily. [place] Tail Mushroom otc supplement    . tamoxifen (NOLVADEX) 20 MG tablet Take 1 tablet (20 mg total) by mouth daily. 90 tablet 4  . venlafaxine XR (EFFEXOR-XR) 37.5 MG 24 hr capsule Take 1 capsule (37.5 mg total) by mouth daily with breakfast. 90 capsule 4  . Vitamin E Skin OIL Apply 1 application topically daily as needed (scarring).     No current facility-administered medications for this visit.    OBJECTIVE: White woman who appears younger than stated age  75:   06/17/20 1115  BP: (!) 151/77  Pulse: 70  Resp: 18  Temp: (!) 97.5 F (36.4 C)  SpO2: 98%      Body mass index is 38 kg/m.   Wt Readings from Last 3 Encounters:  06/17/20 204 lb 6.4 oz (92.7 kg)  05/07/20 202 lb 6.4 oz (91.8 kg)  01/15/20 197 lb 4.8 oz (89.5 kg)      ECOG FS:1 - Symptomatic but completely ambulatory  Sclerae unicteric, EOMs intact Wearing a mask No cervical or supraclavicular adenopathy Lungs no rales or rhonchi Heart regular rate and rhythm Abd soft, nontender, positive bowel sounds MSK no focal spinal tenderness, no upper extremity lymphedema Neuro: nonfocal, well oriented, appropriate affect Breasts: The right breast is benign.  The left breast is status post lumpectomy and radiation.  There is no evidence of local recurrence.  Both axillae are benign.   LAB RESULTS:  CMP     Component Value Date/Time   NA 142 05/07/2020 1630   K 4.6 05/07/2020 1630   CL 105 05/07/2020 1630   CO2 27 05/07/2020 1630   GLUCOSE 95 05/07/2020 1630   BUN 20 05/07/2020 1630   CREATININE 0.91 05/07/2020 1630   CALCIUM 9.4 05/07/2020 1630   PROT 6.5 05/07/2020 1630   ALBUMIN 3.9 08/07/2019 1518   AST 13 05/07/2020 1630   AST 17 08/07/2019 1518   ALT 10 05/07/2020 1630   ALT 18 08/07/2019 1518   ALKPHOS 78 08/07/2019 1518   BILITOT 0.3 05/07/2020 1630   BILITOT 0.4 08/07/2019 1518   GFRNONAA 63 05/07/2020 1630   GFRAA 73 05/07/2020 1630    No results found for: TOTALPROTELP, ALBUMINELP, A1GS, A2GS, BETS, BETA2SER, GAMS, MSPIKE, SPEI  Lab Results  Component Value Date   WBC 5.8 05/07/2020   NEUTROABS 3,921 05/07/2020   HGB 13.7 05/07/2020   HCT 41.2 05/07/2020   MCV 96.7 05/07/2020   PLT 201 05/07/2020    No results found for: LABCA2  No components found for: VVOHYW737  No results for input(s): INR in the last 168 hours.  No results found for: LABCA2  No results found for: TGG269  No results found for: SWN462  No results found for: VOJ500  No results found for: CA2729  No components found for: HGQUANT  No results found for: CEA1 / No  results found for: CEA1   No results found for: AFPTUMOR  No results found for: Garrett  No results found for: KPAFRELGTCHN, LAMBDASER, KAPLAMBRATIO (  kappa/lambda light chains)  No results found for: HGBA, HGBA2QUANT, HGBFQUANT, HGBSQUAN (Hemoglobinopathy evaluation)   No results found for: LDH  Lab Results  Component Value Date   IRON 114 04/05/2017   TIBC 332 04/05/2017   IRONPCTSAT 34 04/05/2017   (Iron and TIBC)  Lab Results  Component Value Date   FERRITIN 166 04/04/2016    Urinalysis    Component Value Date/Time   COLORURINE YELLOW 05/07/2020 1630   APPEARANCEUR CLEAR 05/07/2020 1630   LABSPEC 1.023 05/07/2020 1630   PHURINE 5.5 05/07/2020 1630   GLUCOSEU NEGATIVE 05/07/2020 1630   HGBUR NEGATIVE 05/07/2020 1630   BILIRUBINUR NEGATIVE 02/20/2019 1332   KETONESUR NEGATIVE 05/07/2020 1630   PROTEINUR NEGATIVE 05/07/2020 1630   UROBILINOGEN 0.2 12/31/2012 1151   NITRITE NEGATIVE 02/20/2019 1332   LEUKOCYTESUR NEGATIVE 02/20/2019 1332    STUDIES: MM DIAG BREAST TOMO BILATERAL  Result Date: 06/12/2020 CLINICAL DATA:  75 year old female for annual follow-up. History of LEFT breast phyllodes tumor/DCIS with lumpectomy in 2021. EXAM: DIGITAL DIAGNOSTIC BILATERAL MAMMOGRAM WITH CAD AND TOMO COMPARISON:  Previous exam(s). ACR Breast Density Category c: The breast tissue is heterogeneously dense, which may obscure small masses. FINDINGS: 2D and 3D full field views of both breasts and a magnification view of the lumpectomy site demonstrate no suspicious mass, nonsurgical distortion or worrisome calcifications. LEFT lumpectomy changes are again noted. Mammographic images were processed with CAD. IMPRESSION: No evidence of breast malignancy. RECOMMENDATION: Bilateral diagnostic mammogram in 1 year. I have discussed the findings and recommendations with the patient. Results were also provided in writing at the conclusion of the visit. If applicable, a reminder letter will be  sent to the patient regarding the next appointment. BI-RADS CATEGORY  2: Benign. Electronically Signed   By: Margarette Canada M.D.   On: 06/12/2020 09:44     ELIGIBLE FOR AVAILABLE RESEARCH PROTOCOL: AET  ASSESSMENT: 75 y.o. [place] woman status post left lumpectomy 07/26/2019 for ductal carcinoma in situ, estrogen receptor positive, arising in a phyllodes tumor [grading of the lesion is difficult since it has some features of a benign lesion (i.e., <5 mitoses/10 HPF) and some features of a borderline lesion (particularly the irregular border)]   (a) the ductal carcinoma in situ is low-grade, does not involve the margins, and is strongly estrogen and progesterone receptor positive  (b) margins are broadly positive for the fibroepithelial lesion  (c) additional surgery 09/05/2019 for margin clearance found no residual phyllodes tumor or DCIS  (1) adjuvant radiation 10/14/2019-11/11/2019 Site Technique Total Dose (Gy) Dose per Fx (Gy) Completed Fx Beam Energies  Breast, Left: Breast_Lt 3D 40.05/40.05 2.67 15/15 6X, 10X  Breast, Left: Breast_Lt_Bst 3D 10/10 2 5/5 6X, 10X   (2) started tamoxifen 02/29/2020   PLAN: Yiselle is now just about a year out from definitive surgery for her breast cancer with no evidence of disease recurrence.  She is tolerating tamoxifen well and the plan is to continue that a minimum of 5 years.  I have commended her exercise program.  She also has a good diet.  From this point she will start seeing [place] once a year until she completes her 5 years of follow-up  Total encounter time 20 minutes.Sarajane Jews C. Antonie Borjon, MD 06/17/2020 5:21 PM Medical Oncology and Hematology [facility] [place],  [address] Tel. [phone]    Fax. [phone]   This document serves as a record of services personally performed by Lurline Del, MD. It was created on  his behalf by Wilburn Mylar, a trained medical scribe. The creation of this  record is based on the scribe's personal observations and the provider's statements to them.   I, Lurline Del MD, have reviewed the above documentation for accuracy and completeness, and I agree with the above.   *Total Encounter Time as defined by the Centers for Medicare and Medicaid Services includes, in addition to the face-to-face time of a patient visit (documented in the note above) non-face-to-face time: obtaining and reviewing outside history, ordering and reviewing medications, tests or procedures, care coordination (communications with other health care professionals or caregivers) and documentation in the medical record.

## 2020-06-17 ENCOUNTER — Inpatient Hospital Stay: Payer: Medicare Other | Attending: Oncology | Admitting: Oncology

## 2020-06-17 ENCOUNTER — Other Ambulatory Visit: Payer: Self-pay

## 2020-06-17 VITALS — BP 151/77 | HR 70 | Temp 97.5°F | Resp 18 | Ht 61.5 in | Wt 204.4 lb

## 2020-06-17 DIAGNOSIS — C50112 Malignant neoplasm of central portion of left female breast: Secondary | ICD-10-CM | POA: Insufficient documentation

## 2020-06-17 DIAGNOSIS — D0512 Intraductal carcinoma in situ of left breast: Secondary | ICD-10-CM

## 2020-06-17 DIAGNOSIS — Z79899 Other long term (current) drug therapy: Secondary | ICD-10-CM | POA: Diagnosis not present

## 2020-06-17 DIAGNOSIS — Z7982 Long term (current) use of aspirin: Secondary | ICD-10-CM | POA: Diagnosis not present

## 2020-06-17 DIAGNOSIS — M8580 Other specified disorders of bone density and structure, unspecified site: Secondary | ICD-10-CM | POA: Diagnosis not present

## 2020-06-17 DIAGNOSIS — Z87891 Personal history of nicotine dependence: Secondary | ICD-10-CM | POA: Diagnosis not present

## 2020-06-17 DIAGNOSIS — R232 Flushing: Secondary | ICD-10-CM | POA: Diagnosis not present

## 2020-06-17 DIAGNOSIS — Z170 Estrogen receptor positive status [ER+]: Secondary | ICD-10-CM | POA: Diagnosis not present

## 2020-06-17 MED ORDER — TAMOXIFEN CITRATE 20 MG PO TABS: 20.0000 mg | ORAL_TABLET | Freq: Every day | ORAL | 4 refills | Status: DC

## 2020-06-17 MED ORDER — VENLAFAXINE HCL ER 37.5 MG PO CP24: 37.5000 mg | ORAL_CAPSULE | Freq: Every day | ORAL | 4 refills | Status: DC

## 2020-07-09 DIAGNOSIS — D0512 Intraductal carcinoma in situ of left breast: Secondary | ICD-10-CM | POA: Diagnosis not present

## 2020-07-09 DIAGNOSIS — M8588 Other specified disorders of bone density and structure, other site: Secondary | ICD-10-CM | POA: Diagnosis not present

## 2020-08-24 DIAGNOSIS — Z853 Personal history of malignant neoplasm of breast: Secondary | ICD-10-CM | POA: Diagnosis not present

## 2020-10-20 ENCOUNTER — Other Ambulatory Visit: Payer: Self-pay | Admitting: *Deleted

## 2020-10-22 ENCOUNTER — Other Ambulatory Visit: Payer: Self-pay | Admitting: *Deleted

## 2020-10-22 MED ORDER — VENLAFAXINE HCL ER 37.5 MG PO CP24: 37.5000 mg | ORAL_CAPSULE | Freq: Every day | ORAL | 4 refills | Status: DC

## 2020-11-18 NOTE — Progress Notes (Signed)
MEDICARE ANNUAL WELLNESS VISIT AND FOLLOW UP  Assessment:   Encounter for Medicare annual wellness exam 1 year  Depression, major, in remission ([place]) - continue medications, stress management techniques discussed, increase water, good sleep hygiene discussed, increase exercise, and increase veggies.   Phyllodes tumor of breast S/p lumpectomy with radiation  Ductal carcinoma in situ (DCIS) of left breast S/p lumpectomy with radiation Still has pain in left breast   Labile hypertension - continue medications, DASH diet, exercise and monitor at home. Call if greater than 130/80.   Hyperlipidemia, unspecified hyperlipidemia type check lipids- not tolerate of statin/zetia Will get cardiac calcium score if elevated will refer to lipid clinic decrease fatty foods increase activity.   Medication management  Vitamin D deficiency Continue supplement  Anemia, unspecified type - monitor, continue iron supp with Vitamin C and increase green leafy veggies  Osteopenia, unspecified location Monitor  Ocular rosacea Monitor  Class 1 obesity due to excess calories with serious comorbidity and body mass index (BMI) of 32.0 to 32.9 in adult - follow up 3 months for progress monitoring - increase veggies, decrease carbs - long discussion about weight loss, diet, and exercise  Attention deficit disorder, unspecified hyperactivity presence  Gastroesophageal reflux disease, unspecified whether esophagitis present Continue PPI/H2 blocker, diet discussed  Allergy, subsequent encounter Allergic rhinitis - Allegra OTC, increase H20, allergy hygiene explained.  Primary osteoarthritis involving multiple joints Continue follow up ortho  Arthritis of right knee Continue follow up ortho  Over 40 minutes of exam, counseling, chart review and critical decision making was performed Future Appointments  Date Time Provider [place]  05/10/2021  3:00 PM Magda Bernheim, NP [facility]  None  07/16/2021 10:00 AM Nicholas Lose, MD [facility] None  11/19/2021 11:00 AM Magda Bernheim, NP [facility] None     Plan:   During the course of the visit the patient was educated and counseled about appropriate screening and preventive services including:   Pneumococcal vaccine  Prevnar 13 Influenza vaccine Td vaccine Screening electrocardiogram Bone densitometry screening Colorectal cancer screening Diabetes screening Glaucoma screening Nutrition counseling  Advanced directives: requested   Subjective:  Kathy Cook is a 75 y.o. female who presents for Medicare Annual Wellness Visit and 3 month follow up.   Her blood pressure has been controlled at home, today their BP is BP: 124/80 BP Readings from Last 3 Encounters:  11/19/20 124/80  06/17/20 (!) 151/77  05/07/20 112/84   BMI is Body mass index is 38.52 kg/m., she has not been working on diet and exercise. She has been limiting carbs, eating fresh fruits/vegetables and fiber. Push water Wt Readings from Last 3 Encounters:  11/19/20 207 lb 3.2 oz (94 kg)  06/17/20 204 lb 6.4 oz (92.7 kg)  05/07/20 202 lb 6.4 oz (91.8 kg)      She does workout. She denies chest pain, shortness of breath, dizziness.  She is not on cholesterol medication and denies myalgias. Her cholesterol is not at goal. The cholesterol last visit was:   Lab Results  Component Value Date   CHOL 258 (H) 05/07/2020   HDL 85 05/07/2020   LDLCALC 136 (H) 05/07/2020   TRIG 220 (H) 05/07/2020   CHOLHDL 3.0 05/07/2020    She has been working on diet and exercise for abnormal glucose, and denies hyperglycemia, increased appetite, and paresthesia of the feet. Last A1C in the office was:  Lab Results  Component Value Date   HGBA1C 5.6 05/07/2020   Last GFR:  Lab Results  Component Value Date   FBPZWCHE 52 05/07/2020   Lab Results  Component Value Date   GFRAA 73 05/07/2020   Patient is on Vitamin D supplement.   Lab Results  Component  Value Date   VD25OH 48 05/07/2020     Right knee replacement 02/2018 started rehab and did 2 months and then was diagnosed with breast cancer.  Did not complete due to breast cancer.  The right knee continues to give her aching pain, difficulty walking up steps. Does hear popping/clicking in the knee, continues to work on exercises.  Medication Review: Current Outpatient Medications on File Prior to Visit  Medication Sig Dispense Refill   amphetamine-dextroamphetamine (ADDERALL) 30 MG tablet Take 1/2 to 1 tablet 2 x /day as needed for Focus & Concentration 60 tablet 0   celecoxib (CELEBREX) 200 MG capsule Take 200 mg by mouth 2 (two) times daily.     Cholecalciferol (DIALYVITE VITAMIN D 5000) 125 MCG (5000 UT) capsule Take 10,000 Units by mouth daily.     famciclovir (FAMVIR) 500 MG tablet Take 1 tablet (500 mg total) by mouth 2 (two) times daily as needed (fever blisters). 60 tablet 1   fexofenadine (ALLEGRA ALLERGY) 180 MG tablet Take 180 mg by mouth daily.     tamoxifen (NOLVADEX) 20 MG tablet Take 1 tablet (20 mg total) by mouth daily. 90 tablet 4   venlafaxine XR (EFFEXOR-XR) 37.5 MG 24 hr capsule Take 1 capsule (37.5 mg total) by mouth daily with breakfast. 90 capsule 4   acetaminophen (TYLENOL) 500 MG tablet Take 500 mg by mouth every 6 (six) hours as needed for moderate pain or headache. (Patient not taking: Reported on 11/19/2020)     aspirin EC 81 MG tablet Take 81 mg by mouth daily. Swallow whole. (Patient not taking: Reported on 11/19/2020)     fluticasone (FLONASE) 50 MCG/ACT nasal spray Use      1 to 2 sprays       to each Nostril        at Bedtime (Patient not taking: Reported on 11/19/2020) 48 g 3   loratadine (CLARITIN) 10 MG tablet Take 10 mg by mouth daily as needed for allergies.     OVER THE COUNTER MEDICATION Take 550 mg by mouth daily. [place] Mushroom otc supplement     Vitamin E Skin OIL Apply 1 application topically daily as needed (scarring).     No current  facility-administered medications on file prior to visit.    Allergies  Allergen Reactions   Zetia [Ezetimibe] Diarrhea    Patient states she had IBS, that took a year to resolve.   Prednisone Nausea And Vomiting   Statins     paralyzing   Strattera [Atomoxetine Hcl]     Increased sadness    Current Problems (verified) Patient Active Problem List   Diagnosis Date Noted   Acute anal fissure 05/17/2020   Phyllodes tumor of breast 08/07/2019   Ductal carcinoma in situ (DCIS) of left breast 08/06/2019   Status post total hip replacement, right 03/05/2019   Arthritis of right knee 03/04/2019   Unilateral primary osteoarthritis, right knee 02/05/2019   Knee locking, right 01/09/2019   Metatarsal stress fracture, right, initial encounter 10/03/2016   DJD (degenerative joint disease) 06/09/2016   Labile hypertension 08/01/2014   Vitamin D deficiency 08/01/2014   Medication management 08/01/2014   Obesity 07/04/2014   Hyperlipemia 06/27/2013   Attention deficit disorder (ADD)    GERD (gastroesophageal reflux  disease)    Allergy    Depression, major, in remission ([place])    Anemia    Ocular rosacea    HSV infection    Osteopenia     Screening Tests Immunization History  Administered Date(s) Administered   DT (Pediatric) 01/14/2015   Influenza, High Dose Seasonal PF 04/30/2018, 11/13/2018, 01/06/2020   PFIZER(Purple Top)SARS-COV-2 Vaccination 04/07/2019, 05/01/2019   Pneumococcal Conjugate-13 03/24/2015   Pneumococcal-Unspecified 12/31/2012   Td 03/29/2004    Preventative care: Last colonoscopy: 06/2017, due 2029 Last mammogram: 06/12/20 negative repeat 1 year Last pap smear/pelvic exam: 07/09/20   DEXA: 07/2020 [facility]  Prior vaccinations: TD or Tdap: 01/14/15  Influenza: 01/06/20  Pneumococcal: 2014 Prevnar13: 2017 Shingles/Zostavax: declines  Names of Other Physician/Practitioners you currently use: 1.  [facility] here for primary care 2. Dr. Katy Fitch, eye doctor, last visit 2022 3. Dr. Johnnye Sima, dentist, last visit 2022 Patient Care Team: Unk Pinto, MD as PCP - General (Internal Medicine) Dian Queen, MD as Consulting Physician (Obstetrics and Gynecology) Clent Jacks, MD as Consulting Physician (Ophthalmology) Lindwood Coke, MD as Consulting Physician (Dermatology) Magrinat, Virgie Dad, MD as Consulting Physician (Oncology) Coralie Keens, MD as Consulting Physician (General Surgery) Eppie Gibson, MD as Attending Physician (Radiation Oncology) [name], MD as Consulting Physician (Orthopedic Surgery) Mauro Kaufmann, RN as Oncology Nurse Navigator Rockwell Germany, RN as Oncology Nurse Navigator  SURGICAL HISTORY She  has a past surgical history that includes Tonsillectomy and adenoidectomy; Skin biopsy; Total knee arthroplasty (Right, 03/04/2019); Breast lumpectomy (Left, 07/18/2019); Colonoscopy (2019); Upper gi endoscopy; and Re-excision of breast lumpectomy (Left, 09/05/2019). FAMILY HISTORY Her family history includes Depression in her mother; Diabetes in her father; Heart disease in her mother; Hypertension in her mother; Macular degeneration in her mother. SOCIAL HISTORY She  reports that she quit smoking about 40 years ago. Her smoking use included cigarettes. She has a 7.50 pack-year smoking history. She has never used smokeless tobacco. She reports current alcohol use. She reports that she does not use drugs.   MEDICARE WELLNESS OBJECTIVES: Physical activity: Current Exercise Habits: Structured exercise class, Type of exercise: strength training/weights;walking, Time (Minutes): 20, Frequency (Times/Week): 2, Weekly Exercise (Minutes/Week): 40, Intensity: Mild, Exercise limited by: orthopedic condition(s) Cardiac risk factors: Cardiac Risk Factors include: dyslipidemia;hypertension;sedentary lifestyle;obesity (BMI >30kg/m2) Depression/mood screen:    Depression screen [facility] 2/9 11/19/2020  Decreased Interest 0  Down, Depressed, Hopeless 0  PHQ - 2 Score 0    ADLs:  In your present state of health, do you have any difficulty performing the following activities: 11/19/2020  Hearing? N  Some recent data might be hidden     Cognitive Testing  Alert? Yes  Normal Appearance?Yes  Oriented to person? Yes  Place? Yes   Time? Yes  Recall of three objects?  Yes  Can perform simple calculations? Yes  Displays appropriate judgment?Yes  Can read the correct time from a watch face?Yes  EOL planning: Does Patient Have a Medical Advance Directive?: Yes Type of Advance Directive: Living will, Healthcare Power of Attorney Does patient want to make changes to medical advance directive?: No - Patient declined Copy of [place] in Chart?: No - copy requested  Review of Systems  Constitutional:  Negative for chills, fever and weight loss.  HENT:  Negative for congestion and hearing loss.   Eyes:  Negative for blurred vision and double vision.  Respiratory:  Negative for cough and shortness of breath.  Cardiovascular:  Negative for chest pain, palpitations, orthopnea and leg swelling.  Gastrointestinal:  Negative for abdominal pain, constipation, diarrhea, heartburn, nausea and vomiting.  Musculoskeletal:  Positive for joint pain (right knee). Negative for falls and myalgias.  Skin:  Negative for rash.  Neurological:  Negative for dizziness, tingling, tremors, loss of consciousness and headaches.  Psychiatric/Behavioral:  Negative for depression, memory loss and suicidal ideas.     Objective:     Today's Vitals   11/19/20 1419  BP: 124/80  Pulse: 75  Temp: 97.7 F (36.5 C)  SpO2: 95%  Weight: 207 lb 3.2 oz (94 kg)   Body mass index is 38.52 kg/m.  General appearance: alert, no distress, WD/WN, female HEENT: normocephalic, sclerae anicteric, TMs pearly, nares patent, no discharge or erythema, pharynx normal Oral  cavity: MMM, no lesions Neck: supple, no lymphadenopathy, no thyromegaly, no masses Heart: RRR, normal S1, S2, no murmurs Lungs: CTA bilaterally, no wheezes, rhonchi, or rales Abdomen: +bs, soft, non tender, non distended, no masses, no hepatomegaly, no splenomegaly Musculoskeletal: nontender, no swelling, no obvious deformity Extremities: no edema, no cyanosis, no clubbing Pulses: 2+ symmetric, upper and lower extremities, normal cap refill Neurological: alert, oriented x 3, CN2-12 intact, strength normal upper extremities and lower extremities, sensation normal throughout, DTRs 2+ throughout, no cerebellar signs, gait normal Psychiatric: normal affect, behavior normal, pleasant   Medicare Attestation I have personally reviewed: The patient's medical and social history Their use of alcohol, tobacco or illicit drugs Their current medications and supplements The patient's functional ability including ADLs,fall risks, home safety risks, cognitive, and hearing and visual impairment Diet and physical activities Evidence for depression or mood disorders  The patient's weight, height, BMI, and visual acuity have been recorded in the chart.  I have made referrals, counseling, and provided education to the patient based on review of the above and I have provided the patient with a written personalized care plan for preventive services.    [facility]  11/19/2020

## 2020-11-19 ENCOUNTER — Other Ambulatory Visit: Payer: Self-pay

## 2020-11-19 ENCOUNTER — Encounter: Payer: Self-pay | Admitting: Nurse Practitioner

## 2020-11-19 ENCOUNTER — Ambulatory Visit (INDEPENDENT_AMBULATORY_CARE_PROVIDER_SITE_OTHER): Payer: Medicare Other | Admitting: Nurse Practitioner

## 2020-11-19 VITALS — BP 124/80 | HR 75 | Temp 97.7°F | Wt 207.2 lb

## 2020-11-19 DIAGNOSIS — Z79899 Other long term (current) drug therapy: Secondary | ICD-10-CM | POA: Diagnosis not present

## 2020-11-19 DIAGNOSIS — R7309 Other abnormal glucose: Secondary | ICD-10-CM | POA: Diagnosis not present

## 2020-11-19 DIAGNOSIS — D649 Anemia, unspecified: Secondary | ICD-10-CM | POA: Diagnosis not present

## 2020-11-19 DIAGNOSIS — Z0001 Encounter for general adult medical examination with abnormal findings: Secondary | ICD-10-CM

## 2020-11-19 DIAGNOSIS — L718 Other rosacea: Secondary | ICD-10-CM

## 2020-11-19 DIAGNOSIS — M150 Primary generalized (osteo)arthritis: Secondary | ICD-10-CM

## 2020-11-19 DIAGNOSIS — F325 Major depressive disorder, single episode, in full remission: Secondary | ICD-10-CM

## 2020-11-19 DIAGNOSIS — Z1389 Encounter for screening for other disorder: Secondary | ICD-10-CM

## 2020-11-19 DIAGNOSIS — E785 Hyperlipidemia, unspecified: Secondary | ICD-10-CM | POA: Diagnosis not present

## 2020-11-19 DIAGNOSIS — D0512 Intraductal carcinoma in situ of left breast: Secondary | ICD-10-CM | POA: Diagnosis not present

## 2020-11-19 DIAGNOSIS — M159 Polyosteoarthritis, unspecified: Secondary | ICD-10-CM

## 2020-11-19 DIAGNOSIS — Z1329 Encounter for screening for other suspected endocrine disorder: Secondary | ICD-10-CM

## 2020-11-19 DIAGNOSIS — T7840XD Allergy, unspecified, subsequent encounter: Secondary | ICD-10-CM

## 2020-11-19 DIAGNOSIS — M8580 Other specified disorders of bone density and structure, unspecified site: Secondary | ICD-10-CM | POA: Diagnosis not present

## 2020-11-19 DIAGNOSIS — M8949 Other hypertrophic osteoarthropathy, multiple sites: Secondary | ICD-10-CM

## 2020-11-19 DIAGNOSIS — R0989 Other specified symptoms and signs involving the circulatory and respiratory systems: Secondary | ICD-10-CM

## 2020-11-19 DIAGNOSIS — K219 Gastro-esophageal reflux disease without esophagitis: Secondary | ICD-10-CM

## 2020-11-19 DIAGNOSIS — M1711 Unilateral primary osteoarthritis, right knee: Secondary | ICD-10-CM

## 2020-11-19 DIAGNOSIS — D4860 Neoplasm of uncertain behavior of unspecified breast: Secondary | ICD-10-CM

## 2020-11-19 DIAGNOSIS — R6889 Other general symptoms and signs: Secondary | ICD-10-CM | POA: Diagnosis not present

## 2020-11-19 DIAGNOSIS — F988 Other specified behavioral and emotional disorders with onset usually occurring in childhood and adolescence: Secondary | ICD-10-CM

## 2020-11-19 DIAGNOSIS — E559 Vitamin D deficiency, unspecified: Secondary | ICD-10-CM

## 2020-11-19 DIAGNOSIS — E6609 Other obesity due to excess calories: Secondary | ICD-10-CM

## 2020-11-19 MED ORDER — AMPHETAMINE-DEXTROAMPHETAMINE 30 MG PO TABS: ORAL_TABLET | ORAL | 0 refills | Status: DC

## 2020-11-19 NOTE — Patient Instructions (Signed)
GENERAL HEALTH GOALS   Know what a healthy weight is for you (roughly BMI <25) and aim to maintain this   Aim for 7+ servings of fruits and vegetables daily   70-80+ fluid ounces of water or unsweet tea for healthy kidneys   Limit to max 1 drink of alcohol per day; avoid smoking/tobacco   Limit animal fats in diet for cholesterol and heart health - choose grass fed whenever available   Avoid highly processed foods, and foods high in saturated/trans fats   Aim for low stress - take time to unwind and care for your mental health   Aim for 150 min of moderate intensity exercise weekly for heart health, and weights twice weekly for bone health   Aim for 7-9 hours of sleep daily   Vitamin D., Vit C, Zinc, Magnesium daily

## 2020-11-19 NOTE — Addendum Note (Signed)
Addended by: Arelia Sneddon on: 11/19/2020 04:28 PM   Modules accepted: Orders

## 2020-11-20 LAB — LIPID PANEL
Cholesterol: 257 mg/dL — ABNORMAL HIGH (ref <200)
HDL: 75 mg/dL (ref >=50)
LDL Cholesterol (Calc): 137 mg/dL (calc) — ABNORMAL HIGH
Non-HDL Cholesterol (Calc): 182 mg/dL (calc) — ABNORMAL HIGH (ref <130)
Total CHOL/HDL Ratio: 3.4 (calc) (ref <5.0)
Triglycerides: 312 mg/dL — ABNORMAL HIGH (ref <150)

## 2020-11-20 LAB — CBC WITH DIFFERENTIAL/PLATELET
Absolute Monocytes: 694 cells/uL (ref 200–950)
Basophils Absolute: 61 cells/uL (ref 0–200)
Basophils Relative: 0.9 %
Eosinophils Absolute: 190 cells/uL (ref 15–500)
Eosinophils Relative: 2.8 %
HCT: 40.7 % (ref 35.0–45.0)
Hemoglobin: 13.5 g/dL (ref 11.7–15.5)
Lymphs Abs: 1272 cells/uL (ref 850–3900)
MCH: 32.4 pg (ref 27.0–33.0)
MCHC: 33.2 g/dL (ref 32.0–36.0)
MCV: 97.6 fL (ref 80.0–100.0)
MPV: 10.6 fL (ref 7.5–12.5)
Monocytes Relative: 10.2 %
Neutro Abs: 4583 cells/uL (ref 1500–7800)
Neutrophils Relative %: 67.4 %
Platelets: 209 10*3/uL (ref 140–400)
RBC: 4.17 10*6/uL (ref 3.80–5.10)
RDW: 11.7 % (ref 11.0–15.0)
Total Lymphocyte: 18.7 %
WBC: 6.8 10*3/uL (ref 3.8–10.8)

## 2020-11-20 LAB — MAGNESIUM: Magnesium: 2.1 mg/dL (ref 1.5–2.5)

## 2020-11-20 LAB — COMPLETE METABOLIC PANEL WITH GFR
AG Ratio: 2 (calc) (ref 1.0–2.5)
ALT: 13 U/L (ref 6–29)
AST: 11 U/L (ref 10–35)
Albumin: 4.2 g/dL (ref 3.6–5.1)
Alkaline phosphatase (APISO): 51 U/L (ref 37–153)
BUN/Creatinine Ratio: 30 (calc) — ABNORMAL HIGH (ref 6–22)
BUN: 26 mg/dL — ABNORMAL HIGH (ref 7–25)
CO2: 29 mmol/L (ref 20–32)
Calcium: 9.8 mg/dL (ref 8.6–10.4)
Chloride: 104 mmol/L (ref 98–110)
Creat: 0.86 mg/dL (ref 0.60–1.00)
Globulin: 2.1 g/dL (calc) (ref 1.9–3.7)
Glucose, Bld: 104 mg/dL — ABNORMAL HIGH (ref 65–99)
Potassium: 4.8 mmol/L (ref 3.5–5.3)
Sodium: 141 mmol/L (ref 135–146)
Total Bilirubin: 0.3 mg/dL (ref 0.2–1.2)
Total Protein: 6.3 g/dL (ref 6.1–8.1)
eGFR: 72 mL/min/{1.73_m2} (ref >=60)

## 2020-11-20 LAB — VITAMIN D 25 HYDROXY (VIT D DEFICIENCY, FRACTURES): Vit D, 25-Hydroxy: 69 ng/mL (ref 30–100)

## 2020-11-20 LAB — HEMOGLOBIN A1C
Hgb A1c MFr Bld: 5.5 % of total Hgb (ref <5.7)
Mean Plasma Glucose: 111 mg/dL
eAG (mmol/L): 6.2 mmol/L

## 2020-11-20 LAB — TSH: TSH: 3.04 mIU/L (ref 0.40–4.50)

## 2020-12-07 ENCOUNTER — Other Ambulatory Visit: Payer: Self-pay | Admitting: *Deleted

## 2020-12-07 MED ORDER — VENLAFAXINE HCL ER 75 MG PO CP24: 75.0000 mg | ORAL_CAPSULE | Freq: Every day | ORAL | 4 refills | Status: DC

## 2020-12-07 MED ORDER — VENLAFAXINE HCL ER 75 MG PO CP24: 75.0000 mg | ORAL_CAPSULE | Freq: Every day | ORAL | 3 refills | Status: DC

## 2020-12-07 NOTE — Telephone Encounter (Signed)
Pt called to this RN to state she is currently out of the venlafaxine pills due to " I increased them per my discussion with Dr Jana Hakim previously and called [facility] to inform them including need for an early refill - and they said they would send it " - " called again last week and told the same and I took my last pill on Wednesday and now they are telling me it is too early to fill "  " I have been off for 4 days- I am a mess- "  This RN validated her feelings as well as that a local prescription would be called in for a 30 day supply of the 75 xr tablet as well as refills will be given so if needed in the future if her mail order was delayed she could obtain for continuity of therapy.  This RN also sent a new prescription of the 75 er mg tablets to her mail order pharmacy for a 90 day supply.  This RN discussed change in dosage from 2 tabs of the 37.5mg  to 1 tablet of the 75 mg dose.  Pt verbalized understanding.

## 2021-04-07 NOTE — Progress Notes (Signed)
Annual Screening/Preventative Visit & Comprehensive Evaluation &  Examination  Future Appointments  Date Time Provider Department  04/08/2021  3:00 PM Unk Pinto, MD [facility]  07/16/2021 10:00 AM Nicholas Lose, MD [facility]  11/19/2021 11:00 AM Magda Bernheim, NP [facility]  04/14/2022  3:00 PM Unk Pinto, MD [facility]        This very nice 76 y.o.  MWF presents for a Screening /Preventative Visit & comprehensive evaluation and management of multiple medical co-morbidities.  Patient has been followed for HTN, HLD, Prediabetes  and Vitamin D Deficiency.  Patient has hx/o Depression in remission on Effexor.         Patienty has been followed expectantly for labile elevated BP.  Patient's BP has been controlled  and patient denies any cardiac symptoms as chest pain, palpitations, shortness of breath, dizziness or ankle swelling. Today's BP  is at goal - 128/82.        Patient's hyperlipidemia has hx/o statin intolerance  &  allergy to Ezetimibe  &  is not controlled with diet .  Patient denies myalgias or other medication SE's. Last lipids were not at goal :  Lab Results  Component Value Date   CHOL 257 (H) 11/19/2020   HDL 75 11/19/2020   LDLCALC 137 (H) 11/19/2020   TRIG 312 (H) 11/19/2020   CHOLHDL 3.4 11/19/2020         Patient has hx/o moderate obesity  (BMI 34+)  and is monitored expectantly for glucose intolerance  and patient denies reactive hypoglycemic symptoms, visual blurring, diabetic polys or paresthesias. Last A1c was normal & at goal :  Lab Results  Component Value Date   HGBA1C 5.5 11/19/2020         Finally, patient has history of Vitamin D Deficiency ("40" /2010) and last Vitamin D was at goal :  Lab Results  Component Value Date   VD25OH 69 11/19/2020     Current Outpatient Medications on File Prior to Visit  Medication Sig   ADDERALL 30 MG tablet Take 1/2 to 1 tablet 2 x /day as needed for Focus & Concentration          TEVA BRAND ONLY    celecoxib (CELEBREX) 200 MG capsule Take 2  times daily.   VITAMIN D 5,000 u Take 10,000 Units daily.   famciclovir (FAMVIR) 500 MG tablet Take 1 tablet 2 times daily as needed    fexofenadine 180 MG tablet Take  daily.   tamoxifen  20 MG tablet Take 1 tablet daily.   venlafaxine-XR 75 MG  Take 1 capsule  daily with breakfast.     Allergies  Allergen Reactions   Zetia [Ezetimibe] Diarrhea    Patient states she had IBS, that took a year to resolve.   Prednisone Nausea And Vomiting   Statins     paralyzing   Strattera [Atomoxetine Hcl]     Increased sadness     Past Medical History:  Diagnosis Date   Allergy    Anxiety    Attention deficit disorder (ADD)    Cancer ([place])    ? basal/squam cell on chest, and forehead   Depression    Dry eyes    GERD (gastroesophageal reflux disease)    diet controlled, no meds   HLD (hyperlipidemia)    HSV infection    Lumbar degenerative disc disease    Ocular rosacea    Osteopenia      Health Maintenance  Topic Date Due   Zoster Vaccines- Shingrix (  1 of 2) Never done   Pneumonia Vaccine 51+ Years old (2 - PPSV23 if available, else PCV20) 03/23/2016   COVID-19 Vaccine (3 - Pfizer risk series) 05/29/2019   INFLUENZA VACCINE  09/28/2020   MAMMOGRAM  06/13/2022   TETANUS/TDAP  01/14/2025   COLONOSCOPY  07/13/2027   DEXA SCAN  Completed   Hepatitis C Screening  Completed   HPV VACCINES  Aged Out    Immunization History  Administered Date(s) Administered   DT  01/14/2015   Influenza, High Dose  04/30/2018, 11/13/2018, 01/06/2020   PFIZER SARS-COV-2 Vacc 04/07/2019, 05/01/2019   Pneumococcal -13 03/24/2015   Pneumococcal-23 12/31/2012   Td 03/29/2004    Last Colon - 07/12/2017 -  Dr Lester Trona - Recc 10 year f/u  due May 2029  (age 18 yo)    Last MGM - 06/12/2020 - to schedule in May.   Past Surgical History:  Procedure Laterality Date   BREAST LUMPECTOMY Left 07/18/2019   Procedure: LEFT BREAST LUMPECTOMY;  Surgeon:  Coralie Keens, MD;  Location: [facility];  Service: General;  Laterality: Left;  LMA   COLONOSCOPY  2019   RE-EXCISION OF BREAST LUMPECTOMY Left 09/05/2019   Procedure: RE-EXCISION OF LEFT BREAST TUMOR;  Surgeon: Coralie Keens, MD;  Location: [facility];  Service: General;  Laterality: Left;  LMA   SKIN BIOPSY     ? basal/squam cell on chest   TONSILLECTOMY AND ADENOIDECTOMY     TOTAL KNEE ARTHROPLASTY Right 03/04/2019   RIGHT TOTAL KNEE ARTHROPLASTY;  [name], MD   UPPER GI ENDOSCOPY       Family History  Problem Relation Age of Onset   Diabetes Father    Hypertension Mother    Depression Mother    Macular degeneration Mother    Heart disease Mother      Social History   Tobacco Use   Smoking status: Former    Packs/day: 0.50    Years: 15.00    Pack years: 7.50    Types: Cigarettes    Quit date: 03/23/1980    Years since quitting: 41.0   Smokeless tobacco: Never  Vaping Use   Vaping Use: Never used  Substance Use Topics   Alcohol use: Yes    Alcohol/week: 0.0 standard drinks    Comment: social   Drug use: No      ROS Constitutional: Denies fever, chills, weight loss/gain, headaches, insomnia,  night sweats, and change in appetite. Does c/o fatigue. Eyes: Denies redness, blurred vision, diplopia, discharge, itchy, watery eyes.  ENT: Denies discharge, congestion, post nasal drip, epistaxis, sore throat, earache, hearing loss, dental pain, Tinnitus, Vertigo, Sinus pain, snoring.  Cardio: Denies chest pain, palpitations, irregular heartbeat, syncope, dyspnea, diaphoresis, orthopnea, PND, claudication, edema Respiratory: denies cough, dyspnea, DOE, pleurisy, hoarseness, laryngitis, wheezing.  Gastrointestinal: Denies dysphagia, heartburn, reflux, water brash, pain, cramps, nausea, vomiting, bloating, diarrhea, constipation, hematemesis, melena, hematochezia, jaundice, hemorrhoids Genitourinary: Denies dysuria, frequency, urgency, nocturia, hesitancy,  discharge, hematuria, flank pain Breast: Breast lumps, nipple discharge, bleeding.  Musculoskeletal: Denies arthralgia, myalgia, stiffness, Jt. Swelling, pain, limp, and strain/sprain. Denies falls. Skin: Denies puritis, rash, hives, warts, acne, eczema, changing in skin lesion Neuro: No weakness, tremor, incoordination, spasms, paresthesia, pain Psychiatric: Denies confusion, memory loss, sensory loss. Denies Depression. Endocrine: Denies change in weight, skin, hair change, nocturia, and paresthesia, diabetic polys, visual blurring, hyper / hypo glycemic episodes.  Heme/Lymph: No excessive bleeding, bruising, enlarged lymph nodes.  Physical Exam  BP 128/82  Pulse 85    Temp 97.9 F (36.6 C)    Resp 16    Ht 5' 2.5" (1.588 m)    Wt 203 lb 6.4 oz (92.3 kg)    LMP  (LMP Unknown)    SpO2 95%    BMI 36.61 kg/m   General Appearance: Over nourished, well groomed and in no apparent distress.  Eyes: PERRLA, EOMs, conjunctiva no swelling or erythema, normal fundi and vessels. Sinuses: No frontal/maxillary tenderness ENT/Mouth: EACs patent / TMs  nl. Nares clear without erythema, swelling, mucoid exudates. Oral hygiene is good. No erythema, swelling, or exudate. Tongue normal, non-obstructing. Tonsils not swollen or erythematous. Hearing normal.  Neck: Supple, thyroid not palpable. No bruits, nodes or JVD. Respiratory: Respiratory effort normal.  BS equal and clear bilateral without rales, rhonci, wheezing or stridor. Cardio: Heart sounds are normal with regular rate and rhythm and no murmurs, rubs or gallops. Peripheral pulses are normal and equal bilaterally without edema. No aortic or femoral bruits. Chest: symmetric with normal excursions and percussion. Breasts: Deferred to MGM Abdomen: Flat, soft with bowel sounds active. Nontender, no guarding, rebound, hernias, masses, or organomegaly.  Lymphatics: Non tender without lymphadenopathy.  Musculoskeletal: Full ROM all peripheral extremities,  joint stability, 5/5 strength, and normal gait. Skin: Warm and dry without rashes, lesions, cyanosis, clubbing or  ecchymosis.  Neuro: Cranial nerves intact, reflexes equal bilaterally. Normal muscle tone, no cerebellar symptoms. Sensation intact.  Pysch: Alert and oriented X 3, normal affect, Insight and Judgment appropriate.    Assessment and Plan  1. Annual Preventative Screening Examination  2. Labile hypertension  - EKG 12-Lead - Urinalysis, Routine w reflex microscopic - Microalbumin / creatinine urine ratio - CBC with Differential/Platelet - COMPLETE METABOLIC PANEL WITH GFR - Magnesium - TSH  3. Hyperlipidemia, mixed  - EKG 12-Lead - Lipid panel - TSH  4. Abnormal glucose  - EKG 12-Lead - Hemoglobin A1c - Insulin, random  5. Vitamin D deficiency  - VITAMIN D 25 Hydroxy   6. Depression, major, in remission ([place])  - TSH  7. Screening for ischemic heart disease  - EKG 12-Lead  8. FHx: heart disease  - EKG 12-Lead  9. Former smoker  - EKG 12-Lead  10. History of left breast cancer   11. Screening for colorectal cancer  - POC Hemoccult Bld/Stl  12. Medication management  - Urinalysis, Routine w reflex microscopic - Microalbumin / creatinine urine ratio - CBC with Differential/Platelet - COMPLETE METABOLIC PANEL WITH GFR - Magnesium - Lipid panel - TSH - Hemoglobin A1c - Insulin, random - VITAMIN D 25 Hydroxy           Patient was counseled in prudent diet to achieve/maintain BMI less than 25 for weight control, BP monitoring, regular exercise and medications. Discussed med's effects and SE's. Screening labs and tests as requested with regular follow-up as recommended. Over 40 minutes of exam, counseling, chart review and high complex critical decision making was performed.   Kirtland Bouchard, MD

## 2021-04-08 ENCOUNTER — Ambulatory Visit (INDEPENDENT_AMBULATORY_CARE_PROVIDER_SITE_OTHER): Payer: Medicare Other | Admitting: Internal Medicine

## 2021-04-08 ENCOUNTER — Encounter: Payer: Self-pay | Admitting: Internal Medicine

## 2021-04-08 ENCOUNTER — Other Ambulatory Visit: Payer: Self-pay

## 2021-04-08 VITALS — BP 128/82 | HR 85 | Temp 97.9°F | Resp 16 | Ht 62.5 in | Wt 203.4 lb

## 2021-04-08 DIAGNOSIS — R7309 Other abnormal glucose: Secondary | ICD-10-CM | POA: Diagnosis not present

## 2021-04-08 DIAGNOSIS — Z8249 Family history of ischemic heart disease and other diseases of the circulatory system: Secondary | ICD-10-CM

## 2021-04-08 DIAGNOSIS — Z136 Encounter for screening for cardiovascular disorders: Secondary | ICD-10-CM | POA: Diagnosis not present

## 2021-04-08 DIAGNOSIS — F988 Other specified behavioral and emotional disorders with onset usually occurring in childhood and adolescence: Secondary | ICD-10-CM

## 2021-04-08 DIAGNOSIS — E559 Vitamin D deficiency, unspecified: Secondary | ICD-10-CM | POA: Diagnosis not present

## 2021-04-08 DIAGNOSIS — Z79899 Other long term (current) drug therapy: Secondary | ICD-10-CM

## 2021-04-08 DIAGNOSIS — Z87891 Personal history of nicotine dependence: Secondary | ICD-10-CM

## 2021-04-08 DIAGNOSIS — E782 Mixed hyperlipidemia: Secondary | ICD-10-CM | POA: Diagnosis not present

## 2021-04-08 DIAGNOSIS — Z1211 Encounter for screening for malignant neoplasm of colon: Secondary | ICD-10-CM

## 2021-04-08 DIAGNOSIS — Z0000 Encounter for general adult medical examination without abnormal findings: Secondary | ICD-10-CM

## 2021-04-08 DIAGNOSIS — R0989 Other specified symptoms and signs involving the circulatory and respiratory systems: Secondary | ICD-10-CM | POA: Diagnosis not present

## 2021-04-08 DIAGNOSIS — F325 Major depressive disorder, single episode, in full remission: Secondary | ICD-10-CM

## 2021-04-08 DIAGNOSIS — Z853 Personal history of malignant neoplasm of breast: Secondary | ICD-10-CM

## 2021-04-08 DIAGNOSIS — Z0001 Encounter for general adult medical examination with abnormal findings: Secondary | ICD-10-CM

## 2021-04-08 IMAGING — MR MR KNEE*R* W/O CM
4 of 6 series · 22 of 40 positions shown · non-contrast
Comparison: None.

CLINICAL DATA: Right knee pain since a hiking injury 11/01/2018

EXAM:
MRI OF THE RIGHT KNEE WITHOUT CONTRAST
TECHNIQUE: Multiplanar, multisequence MR imaging of the knee was performed. No
intravenous contrast was administered.

[Series 3: T2 fat-sat · axial · 4.0mm · 0.50mm/px · z∈[-54,+50]mm · 5 of 27 slices shown (1 of 2)]
[im 1/27]
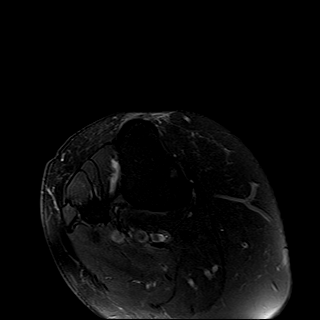
[im 5/27]
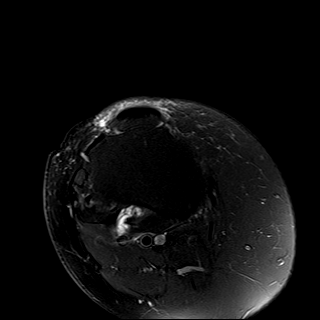
[im 9/27]
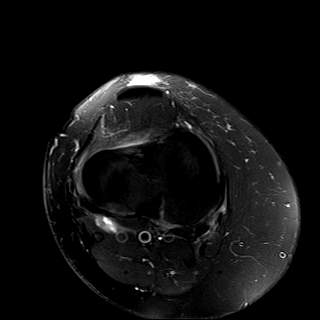
[im 14/27]
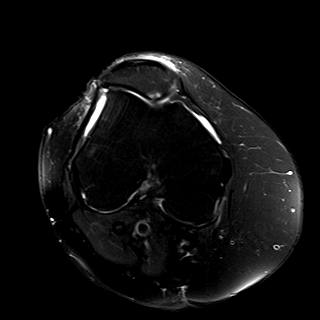
[im 22/27]
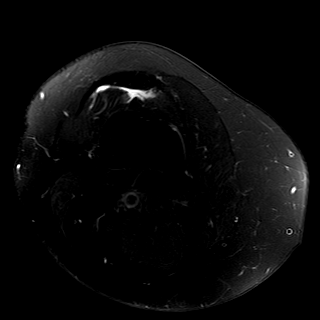

[Series 5: T2 fat-sat · coronal · 4.0mm · 0.29mm/px · 3 of 24 slices shown (2 of 2)]
[im 5/24]
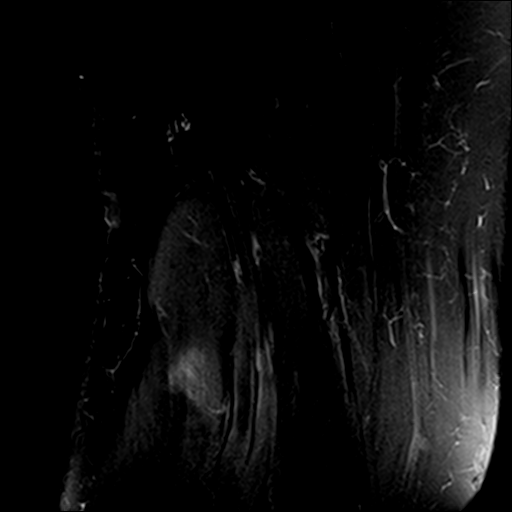
[im 14/24]
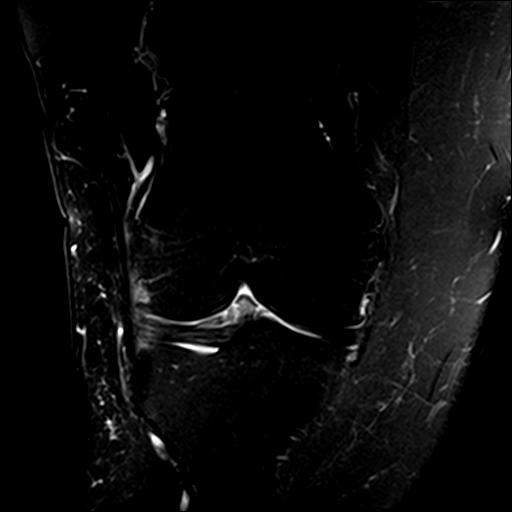
[im 24/24]
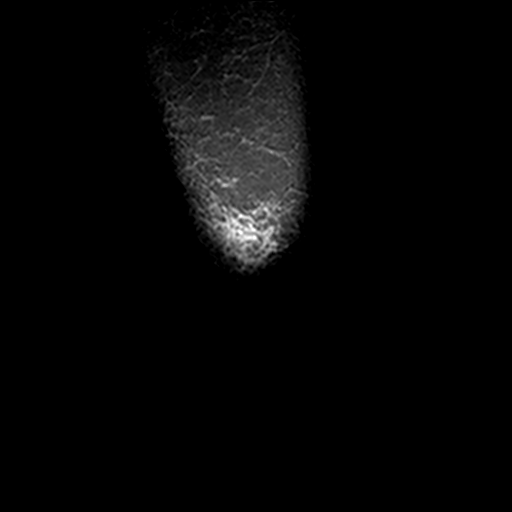

[Series 7: PD fat-sat · sagittal · 3.0mm · 0.29mm/px · 7 of 25 slices shown (1 of 2)]
[im 1/25]
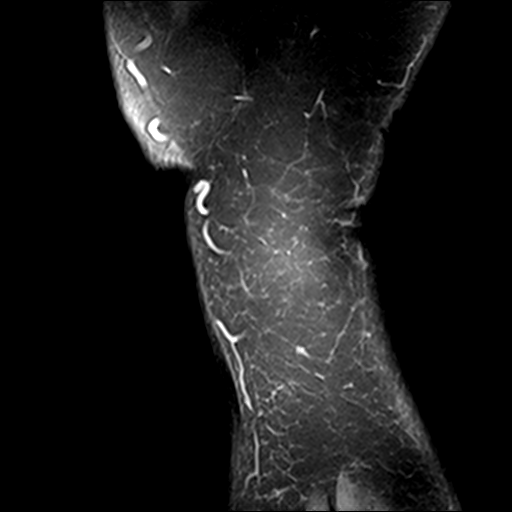
[im 5/25]
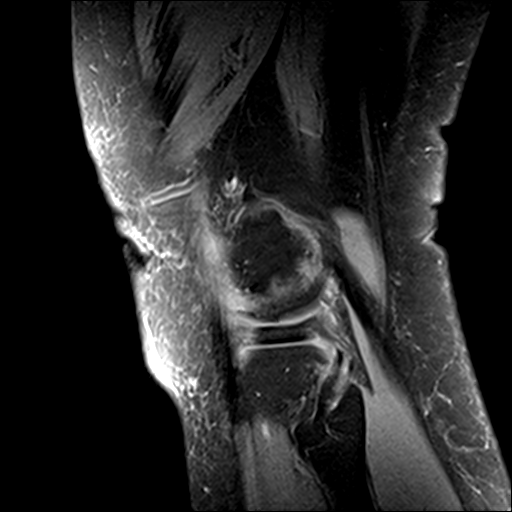
[im 9/25]
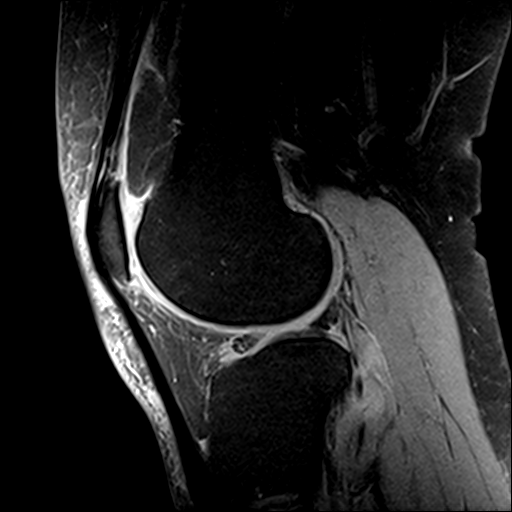
[im 13/25]
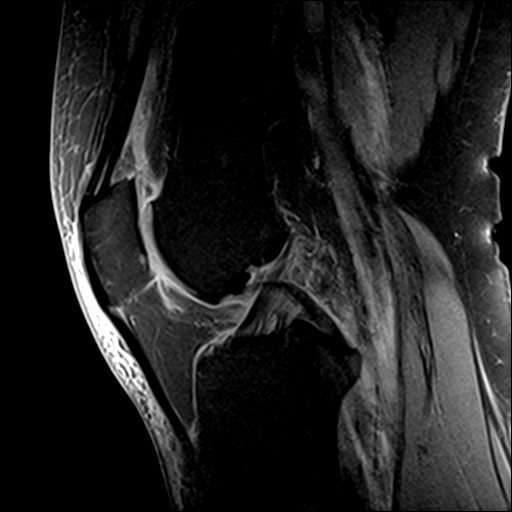
[im 17/25]
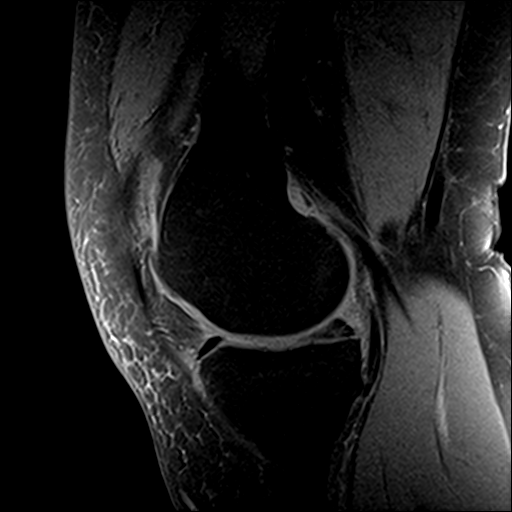
[im 21/25]
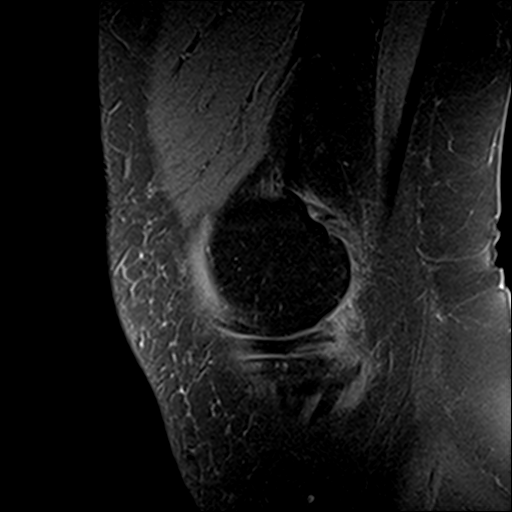
[im 25/25]
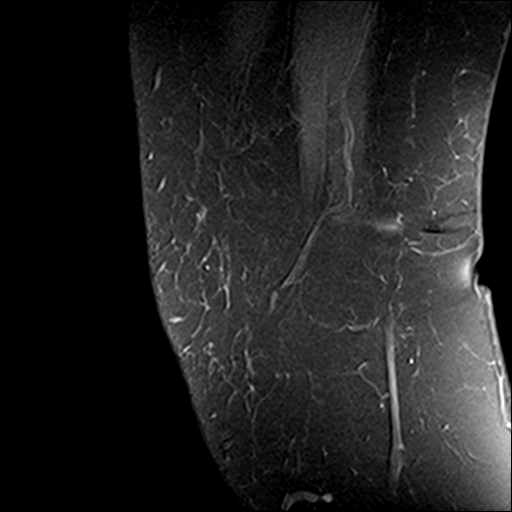

[Series 8: PD fat-sat · coronal · 3.0mm · 0.29mm/px · 7 of 27 slices shown (2 of 2)]
[im 1/27]
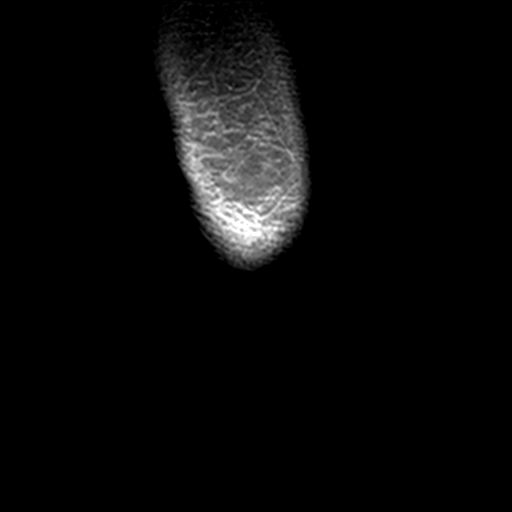
[im 5/27]
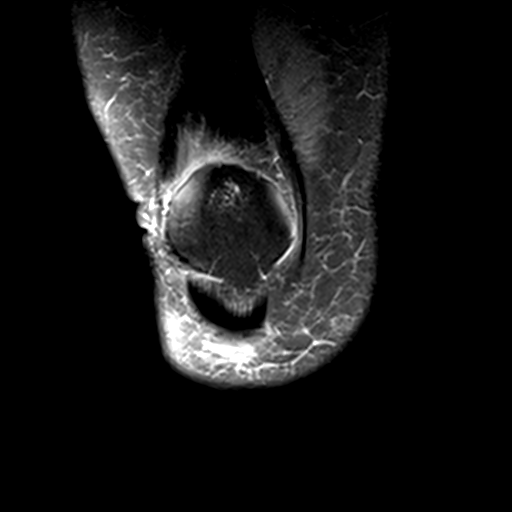
[im 9/27]
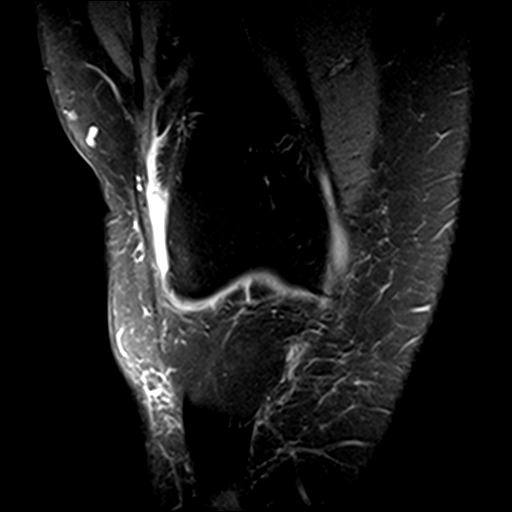
[im 14/27]
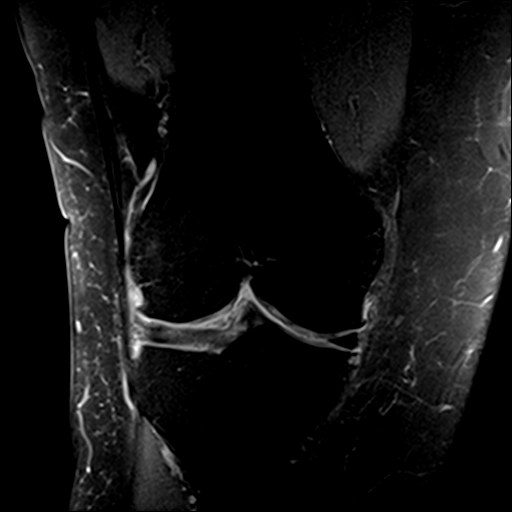
[im 18/27]
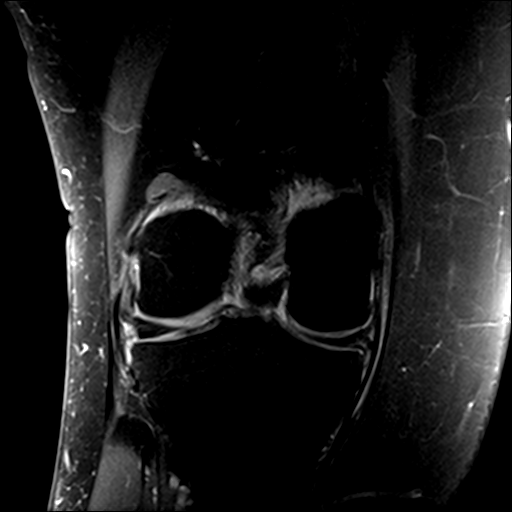
[im 22/27]
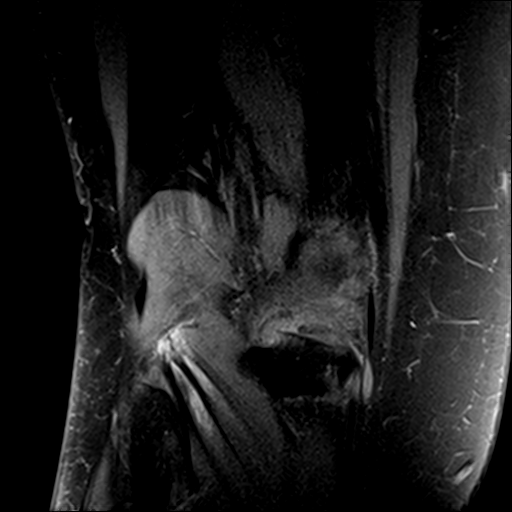
[im 27/27]
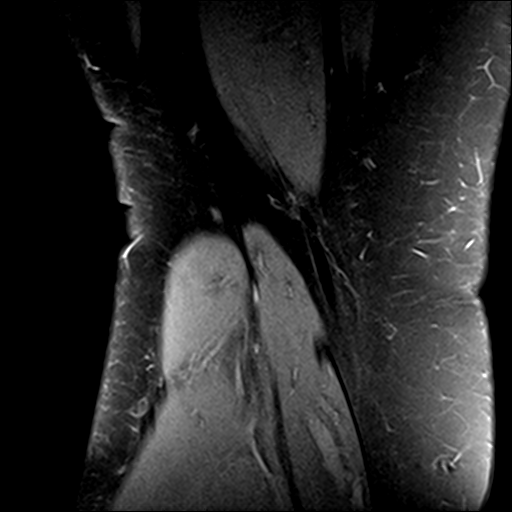

[22 of 40 positions shown; findings below may reference images not displayed]

FINDINGS: MENISCI

Medial meniscus: Inferior articular surface tear involving the
posterior horn and extending into the mid body junction region.

Lateral meniscus: Intrasubstance degenerative type changes involving
the anterior horn and mild discoid morphology. There is also a small
peripheral inferior articular surface tear involving the posterior
horn.

LIGAMENTS

Cruciates:  Intact

Collaterals:  Intact

CARTILAGE

Patellofemoral: Advanced degenerative chondrosis with areas of full
and near full-thickness cartilage loss, joint space narrowing,
spurring and subchondral cystic change.

Medial:  Moderate degenerative chondrosis.

Lateral:  Moderate degenerative chondrosis.

Joint:  Small joint effusion.

Popliteal Fossa: No popliteal mass or Baker's cyst. There is some
fluid tracking back along the popliteus tendon.

Extensor Mechanism: The patella retinacular structures are intact
and the quadriceps and patellar tendons are intact.

Bones:  No acute bony findings.

Other: Normal knee musculature.
IMPRESSION: 1. Medial and lateral meniscal tears as described above.
2. Intact ligamentous structures and no acute bony findings.
3. Tricompartmental degenerative changes most significant in the
patellofemoral joint.
4. Small joint effusion.

## 2021-04-08 MED ORDER — AMPHETAMINE-DEXTROAMPHETAMINE 30 MG PO TABS: ORAL_TABLET | ORAL | 0 refills | Status: DC

## 2021-04-08 NOTE — Patient Instructions (Signed)
Due to recent changes in healthcare laws, you may see the results of your imaging and laboratory studies on MyChart before your provider has had a chance to review them.  We understand that in some cases there may be results that are confusing or concerning to you. Not all laboratory results come back in the same time frame and the provider may be waiting for multiple results in order to interpret others.  Please give us 48 hours in order for your provider to thoroughly review all the results before contacting the office for clarification of your results.  ° °+++++++++++++++++++++++++ ° Vit D  & °Vit C 1,000 mg   °are recommended to help protect  °against the Covid-19 and other Corona viruses.  ° ° Also it's recommended  °to take  °Zinc 50 mg  °to help  °protect against the Covid-19   °and best place to get ° is also on [employer]  °and don't pay more than 6-8 cents /pill !  °================================ °Coronavirus (COVID-19) Are you at risk? ° °Are you at risk for the Coronavirus (COVID-19)? ° °To be considered HIGH RISK for Coronavirus (COVID-19), you have to meet the following criteria: ° °Traveled to [place], [place], [place], [place] or [place]; or in the [place] to [place], [place], [place]  °or [place]; and have fever, cough, and shortness of breath within the last 2 weeks of travel OR °Been in close contact with a person diagnosed with COVID-19 within the last 2 weeks and have  °fever, cough,and shortness of breath ° °IF YOU DO NOT MEET THESE CRITERIA, YOU ARE CONSIDERED LOW RISK FOR COVID-19. ° °What to do if you are HIGH RISK for COVID-19? ° °If you are having a medical emergency, call 911. °Seek medical care right away. Before you go to a doctor’s office, urgent care or emergency department, ° call ahead and tell them about your recent travel, contact with someone diagnosed with COVID-19  ° and your symptoms.  °You should receive instructions from your physician’s office regarding next  steps of care.  °When you arrive at healthcare provider, tell the healthcare staff immediately you have returned from  °visiting [place], [place], [place], [place] or [place]; or traveled in the [place] to [place], [place],  °[place] or [place] in the last two weeks or you have been in close contact with a person diagnosed with  °COVID-19 in the last 2 weeks.   °Tell the health care staff about your symptoms: fever, cough and shortness of breath. °After you have been seen by a medical provider, you will be either: °Tested for (COVID-19) and discharged home on quarantine except to seek medical care if  °symptoms worsen, and asked to  °Stay home and avoid contact with others until you get your results (4-5 days)  °Avoid travel on public transportation if possible (such as bus, train, or airplane) or °Sent to the Emergency Department by EMS for evaluation, COVID-19 testing  and  °possible admission depending on your condition and test results. ° °What to do if you are LOW RISK for COVID-19? ° °Reduce your risk of any infection by using the same precautions used for avoiding the common cold or flu:  °Wash your hands often with soap and warm water for at least 20 seconds.  If soap and water are not readily available,  °use an alcohol-based hand sanitizer with at least 60% alcohol.  °If coughing or sneezing, cover your mouth and nose by coughing   or sneezing into the elbow areas of your shirt or coat,  into a tissue or into your sleeve (not your hands). Avoid shaking hands with others and consider head nods or verbal greetings only. Avoid touching your eyes, nose, or mouth with unwashed hands.  Avoid close contact with people who are sick. Avoid places or events with large numbers of people in one location, like concerts or sporting events. Carefully consider travel plans you have or are making. If you are planning any travel outside or inside the [place], visit the [facility] webpage for the  latest health notices. If you have some symptoms but not all symptoms, continue to monitor at home and seek medical attention  if your symptoms worsen. If you are having a medical emergency, call 911.   >>>>>>>>>>>>>>>>>>>>>>>>>>>>>>>>>  We Do NOT Approve of [employer] > > > > > > > > > > > > > > > > > > > > > > > > > > > > > > > > > > > > > > >  Preventive Care for Adults  A healthy lifestyle and preventive care can promote health and wellness. Preventive health guidelines for women include the following key practices. A routine yearly physical is a good way to check with your health care provider about your health and preventive screening. It is a chance to share any concerns and updates on your health and to receive a thorough exam. Visit your dentist for a routine exam and preventive care every 6 months. Brush your teeth twice a day and floss once a day. Good oral hygiene prevents tooth decay and gum disease. The frequency of eye exams is based on your age, health, family medical history, use of contact lenses, and other factors. Follow your health care provider's recommendations for frequency of eye exams. Eat a healthy diet. Foods like vegetables, fruits, whole grains, low-fat dairy products, and lean protein foods contain the nutrients you need without too many calories. Decrease your intake of foods high in solid fats, added sugars, and salt. Eat the right amount of calories for you. Get information about a proper diet from your health care provider, if necessary. Regular physical exercise is one of the most important things you can do for your health. Most adults should get at least 150 minutes of moderate-intensity exercise (any activity that increases your heart rate and causes you to sweat) each week. In addition, most adults need muscle-strengthening exercises on 2 or more days a week. Maintain a healthy weight. The body mass index (BMI) is a screening tool to identify  possible weight problems. It provides an estimate of body fat based on height and weight. Your health care provider can find your BMI and can help you achieve or maintain a healthy weight. For adults 20 years and older: A BMI below 18.5 is considered underweight. A BMI of 18.5 to 24.9 is normal. A BMI of 25 to 29.9 is considered overweight. A BMI of 30 and above is considered obese. Maintain normal blood lipids and cholesterol levels by exercising and minimizing your intake of saturated fat. Eat a balanced diet with plenty of fruit and vegetables. If your lipid or cholesterol levels are high, you are over 50, or you are at high risk for heart disease, you may need your cholesterol levels checked more frequently. Ongoing high lipid and cholesterol levels should be treated with medicines if diet and exercise are not working. If you smoke, find out from  your health care provider how to quit. If you do not use tobacco, do not start. °Lung cancer screening is recommended for adults aged 55-80 years who are at high risk for developing lung cancer because of a history of smoking. A yearly low-dose CT scan of the lungs is recommended for people who have at least a 30-pack-year history of smoking and are a current smoker or have quit within the past 15 years. A pack year of smoking is smoking an average of 1 pack of cigarettes a day for 1 year (for example: 1 pack a day for 30 years or 2 packs a day for 15 years). Yearly screening should continue until the smoker has stopped smoking for at least 15 years. Yearly screening should be stopped for people who develop a health problem that would prevent them from having lung cancer treatment. °Avoid use of street drugs. Do not share needles with anyone. Ask for help if you need support or instructions about stopping the use of drugs. °High blood pressure causes heart disease and increases the risk of stroke.  Ongoing high blood pressure should be treated with medicines if  weight loss and exercise do not work. °If you are 55-79 years old, ask your health care provider if you should take aspirin to prevent strokes. °Diabetes screening involves taking a blood sample to check your fasting blood sugar level. This should be done once every 3 years, after age 45, if you are within normal weight and without risk factors for diabetes. Testing should be considered at a younger age or be carried out more frequently if you are overweight and have at least 1 risk factor for diabetes. °Breast cancer screening is essential preventive care for women. You should practice "breast self-awareness." This means understanding the normal appearance and feel of your breasts and may include breast self-examination. Any changes detected, no matter how small, should be reported to a health care provider. Women in their 20s and 30s should have a clinical breast exam (CBE) by a health care provider as part of a regular health exam every 1 to 3 years. After age 40, women should have a CBE every year. Starting at age 40, women should consider having a mammogram (breast X-ray test) every year. Women who have a family history of breast cancer should talk to their health care provider about genetic screening. Women at a high risk of breast cancer should talk to their health care providers about having an MRI and a mammogram every year. °Breast cancer gene (BRCA)-related cancer risk assessment is recommended for women who have family members with BRCA-related cancers. BRCA-related cancers include breast, ovarian, tubal, and peritoneal cancers. Having family members with these cancers may be associated with an increased risk for harmful changes (mutations) in the breast cancer genes BRCA1 and BRCA2. Results of the assessment will determine the need for genetic counseling and BRCA1 and BRCA2 testing. °Routine pelvic exams to screen for cancer are no longer recommended for nonpregnant women who are considered low risk for  cancer of the pelvic organs (ovaries, uterus, and vagina) and who do not have symptoms. Ask your health care provider if a screening pelvic exam is right for you. °If you have had past treatment for cervical cancer or a condition that could lead to cancer, you need Pap tests and screening for cancer for at least 20 years after your treatment. If Pap tests have been discontinued, your risk factors (such as having a new sexual partner) need to be   reassessed to determine if screening should be resumed. Some women have medical problems that increase the chance of getting cervical cancer. In these cases, your health care provider may recommend more frequent screening and Pap tests.  Colorectal cancer can be detected and often prevented. Most routine colorectal cancer screening begins at the age of 64 years and continues through age 10 years. However, your health care provider may recommend screening at an earlier age if you have risk factors for colon cancer. On a yearly basis, your health care provider may provide home test kits to check for hidden blood in the stool. Use of a small camera at the end of a tube, to directly examine the colon (sigmoidoscopy or colonoscopy), can detect the earliest forms of colorectal cancer. Talk to your health care provider about this at age 78, when routine screening begins.  Direct exam of the colon should be repeated every 5-10 years through age 28 years, unless early forms of pre-cancerous polyps or small growths are found. Osteoporosis is a disease in which the bones lose minerals and strength with aging. This can result in serious bone fractures or breaks. The risk of osteoporosis can be identified using a bone density scan. Women ages 57 years and over and women at risk for fractures or osteoporosis should discuss screening with their health care providers. Ask your health care provider whether you should take a calcium supplement or vitamin D to reduce the rate of  osteoporosis. Menopause can be associated with physical symptoms and risks. Hormone replacement therapy is available to decrease symptoms and risks. You should talk to your health care provider about whether hormone replacement therapy is right for you. Use sunscreen. Apply sunscreen liberally and repeatedly throughout the day. You should seek shade when your shadow is shorter than you. Protect yourself by wearing long sleeves, pants, a wide-brimmed hat, and sunglasses year round, whenever you are outdoors. Once a month, do a whole body skin exam, using a mirror to look at the skin on your back. Tell your health care provider of new moles, moles that have irregular borders, moles that are larger than a pencil eraser, or moles that have changed in shape or color. Stay current with required vaccines (immunizations). Influenza vaccine. All adults should be immunized every year. Tetanus, diphtheria, and acellular pertussis (Td, Tdap) vaccine. Pregnant women should receive 1 dose of Tdap vaccine during each pregnancy. The dose should be obtained regardless of the length of time since the last dose. Immunization is preferred during the 27th-36th week of gestation. An adult who has not previously received Tdap or who does not know her vaccine status should receive 1 dose of Tdap. This initial dose should be followed by tetanus and diphtheria toxoids (Td) booster doses every 10 years. Adults with an unknown or incomplete history of completing a 3-dose immunization series with Td-containing vaccines should begin or complete a primary immunization series including a Tdap dose. Adults should receive a Td booster every 10 years.  Zoster vaccine. One dose is recommended for adults aged 29 years or older unless certain conditions are present.  Pneumococcal 13-valent conjugate (PCV13) vaccine. When indicated, a person who is uncertain of her immunization history and has no record of immunization should receive the PCV13  vaccine. An adult aged 43 years or older who has certain medical conditions and has not been previously immunized should receive 1 dose of PCV13 vaccine. This PCV13 should be followed with a dose of pneumococcal polysaccharide (PPSV23) vaccine. The PPSV23  vaccine dose should be obtained at least 1 or more year(s) after the dose of PCV13 vaccine. An adult aged 19 years or older who has certain medical conditions and previously received 1 or more doses of PPSV23 vaccine should receive 1 dose of PCV13. The PCV13 vaccine dose should be obtained 1 or more years after the last PPSV23 vaccine dose. ° °Pneumococcal polysaccharide (PPSV23) vaccine. When PCV13 is also indicated, PCV13 should be obtained first. All adults aged 65 years and older should be immunized. An adult younger than age 65 years who has certain medical conditions should be immunized. Any person who resides in a nursing home or long-term care facility should be immunized. An adult smoker should be immunized. People with an immunocompromised condition and certain other conditions should receive both PCV13 and PPSV23 vaccines. People with human immunodeficiency virus (HIV) infection should be immunized as soon as possible after diagnosis. Immunization during chemotherapy or radiation therapy should be avoided. Routine use of PPSV23 vaccine is not recommended for American Indians, [place] Natives, or people younger than 65 years unless there are medical conditions that require PPSV23 vaccine. When indicated, people who have unknown immunization and have no record of immunization should receive PPSV23 vaccine. One-time revaccination 5 years after the first dose of PPSV23 is recommended for people aged 19-64 years who have chronic kidney failure, nephrotic syndrome, asplenia, or immunocompromised conditions. People who received 1-2 doses of PPSV23 before age 65 years should receive another dose of PPSV23 vaccine at age 65 years or later if at least 5 years have  passed since the previous dose. Doses of PPSV23 are not needed for people immunized with PPSV23 at or after age 65 years. ° °Preventive Services / Frequency ° °Ages 65 years and over °Blood pressure check. °Lipid and cholesterol check. °Lung cancer screening. / Every year if you are aged 55-80 years and have a 30-pack-year history of smoking and currently smoke or have quit within the past 15 years. Yearly screening is stopped once you have quit smoking for at least 15 years or develop a health problem that would prevent you from having lung cancer treatment. °Clinical breast exam.** / Every year after age 40 years. ° BRCA-related cancer risk assessment.** / For women who have family members with a BRCA-related cancer (breast, ovarian, tubal, or peritoneal cancers). °Mammogram.** / Every year beginning at age 40 years and continuing for as long as you are in good health. Consult with your health care provider. °Pap test.** / Every 3 years starting at age 30 years through age 65 or 70 years with 3 consecutive normal Pap tests. Testing can be stopped between 65 and 70 years with 3 consecutive normal Pap tests and no abnormal Pap or HPV tests in the past 10 years. °Fecal occult blood test (FOBT) of stool. / Every year beginning at age 50 years and continuing until age 75 years. You may not need to do this test if you get a colonoscopy every 10 years. °Flexible sigmoidoscopy or colonoscopy.** / Every 5 years for a flexible sigmoidoscopy or every 10 years for a colonoscopy beginning at age 50 years and continuing until age 75 years. °Hepatitis C blood test.** / For all people born from 1945 through 1965 and any individual with known risks for hepatitis C. °Osteoporosis screening.** / A one-time screening for women ages 65 years and over and women at risk for fractures or osteoporosis. °Skin self-exam. / Monthly. °Influenza vaccine. / Every year. °Tetanus, diphtheria, and acellular pertussis (Tdap/Td) vaccine.** /   1 dose  of Td every 10 years. °Zoster vaccine.** / 1 dose for adults aged 60 years or older. °Pneumococcal 13-valent conjugate (PCV13) vaccine.** / Consult your health care provider. °Pneumococcal polysaccharide (PPSV23) vaccine.** / 1 dose for all adults aged 65 years and older. °Screening for abdominal aortic aneurysm (AAA)  by ultrasound is recommended for people who have history of high blood pressure or who are current or former smokers. °++++++++++++++++++++ °Recommend Adult Low Dose Aspirin or  °coated  Aspirin 81 mg daily  °To reduce risk of Colon Cancer 40 %, ° Skin Cancer 26 % ,  °Melanoma 46% ° and  °Pancreatic cancer 60% °++++++++++++++++++++ °Vitamin D goal ° is between 70-100.  °Please make sure that you are taking your Vitamin D as directed.  °It is very important as a natural anti-inflammatory  °helping hair, skin, and nails, as well as reducing stroke and heart attack risk.  °It helps your bones and helps with mood. °It also decreases numerous cancer risks so please take it as directed.  °Low Vit D is associated with a 200-300% higher risk for CANCER  °and 200-300% higher risk for HEART   ATTACK  &  STROKE.   °...................................... °It is also associated with higher death rate at younger ages,  °autoimmune diseases like Rheumatoid arthritis, Lupus, Multiple Sclerosis.    °Also many other serious conditions, like depression, Alzheimer's °Dementia, infertility, muscle aches, fatigue, fibromyalgia - just to name a few. °++++++++++++++++++ °Recommend the book "The END of DIETING" by Dr Joel Fuhrman  °& the book "The END of DIABETES " by Dr Joel Fuhrman °At [employer] - get book & Audio CD's  °  Being diabetic has a  300% increased risk for heart attack, stroke, cancer, and alzheimer- type vascular dementia. It is very important that you work harder with diet by avoiding all foods that are white. Avoid white rice (brown & wild rice is OK), white potatoes (sweetpotatoes in moderation is OK),  White bread or wheat bread or anything made out of white flour like bagels, donuts, rolls, buns, biscuits, cakes, pastries, cookies, pizza crust, and pasta (made from white flour & egg whites) - vegetarian pasta or spinach or wheat pasta is OK. Multigrain breads like Arnold's or [employer], or multigrain sandwich thins or flatbreads.  Diet, exercise and weight loss can reverse and cure diabetes in the early stages.  Diet, exercise and weight loss is very important in the control and prevention of complications of diabetes which affects every system in your body, ie. Brain - dementia/stroke, eyes - glaucoma/blindness, heart - heart attack/heart failure, kidneys - dialysis, stomach - gastric paralysis, intestines - malabsorption, nerves - severe painful neuritis, circulation - gangrene & loss of a leg(s), and finally cancer and Alzheimers. ° °  I recommend avoid fried & greasy foods,  sweets/candy, white rice (brown or wild rice or Quinoa is OK), white potatoes (sweet potatoes are OK) - anything made from white flour - bagels, doughnuts, rolls, buns, biscuits,white and wheat breads, pizza crust and traditional pasta made of white flour & egg white(vegetarian pasta or spinach or wheat pasta is OK).  Multi-grain bread is OK - like multi-grain flat bread or sandwich thins. Avoid alcohol in excess. Exercise is also important. ° °  Eat all the vegetables you want - avoid meat, especially red meat and dairy - especially cheese.  Cheese is the most concentrated form of trans-fats which is the worst thing to clog up our arteries. Veggie cheese is OK   which can be found in the fresh produce section at [employer] or [employer] or [employer] ° °+++++++++++++++++++ °DASH Eating Plan ° °DASH stands for "Dietary Approaches to Stop Hypertension."  ° °The DASH eating plan is a healthy eating plan that has been shown to reduce high blood pressure (hypertension). Additional health benefits may include reducing the risk of type 2  diabetes mellitus, heart disease, and stroke. The DASH eating plan may also help with weight loss. °WHAT DO I NEED TO KNOW ABOUT THE DASH EATING PLAN? °For the DASH eating plan, you will follow these general guidelines: °Choose foods with a percent daily value for sodium of less than 5% (as listed on the food label). °Use salt-free seasonings or herbs instead of table salt or sea salt. °Check with your health care provider or pharmacist before using salt substitutes. °Eat lower-sodium products, often labeled as "lower sodium" or "no salt added." °Eat fresh foods. °Eat more vegetables, fruits, and low-fat dairy products. °Choose whole grains. Look for the word "whole" as the first word in the ingredient list. °Choose fish  °Limit sweets, desserts, sugars, and sugary drinks. °Choose heart-healthy fats. °Eat veggie cheese  °Eat more home-cooked food and less restaurant, buffet, and fast food. °Limit fried foods. °Cook foods using methods other than frying. °Limit canned vegetables. If you do use them, rinse them well to decrease the sodium. °When eating at a restaurant, ask that your food be prepared with less salt, or no salt if possible. °                  °   WHAT FOODS CAN I EAT? °Read Dr Joel Fuhrman's books on The End of Dieting & The End of Diabetes ° °Grains °Whole grain or whole wheat bread. Brown rice. Whole grain or whole wheat pasta. Quinoa, bulgur, and whole grain cereals. Low-sodium cereals. Corn or whole wheat flour tortillas. Whole grain cornbread. Whole grain crackers. Low-sodium crackers. ° °Vegetables °Fresh or frozen vegetables (raw, steamed, roasted, or grilled). Low-sodium or reduced-sodium tomato and vegetable juices. Low-sodium or reduced-sodium tomato sauce and paste. Low-sodium or reduced-sodium canned vegetables.  ° °Fruits °All fresh, canned (in natural juice), or frozen fruits. ° °Protein Products ° All fish and seafood.  Dried beans, peas, or lentils. Unsalted nuts and seeds. Unsalted  canned beans. ° °Dairy °Low-fat dairy products, such as skim or 1% milk, 2% or reduced-fat cheeses, low-fat ricotta or cottage cheese, or plain low-fat yogurt. Low-sodium or reduced-sodium cheeses. ° °Fats and Oils °Tub margarines without trans fats. Light or reduced-fat mayonnaise and salad dressings (reduced sodium). Avocado. Safflower, olive, or canola oils. Natural peanut or almond butter. ° °Other °Unsalted popcorn and pretzels. °The items listed above may not be a complete list of recommended foods or beverages. Contact your dietitian for more options. ° °+++++++++++++++ ° °WHAT FOODS ARE NOT RECOMMENDED? °Grains/ White flour or wheat flour °White bread. White pasta. White rice. Refined cornbread. Bagels and croissants. Crackers that contain trans fat. ° °Vegetables ° °Creamed or fried vegetables. Vegetables in a . Regular canned vegetables. Regular canned tomato sauce and paste. Regular tomato and vegetable juices. ° °Fruits °Dried fruits. Canned fruit in light or heavy syrup. Fruit juice. ° °Meat and Other Protein Products °Meat in general - RED meat & White meat.  Fatty cuts of meat. Ribs, chicken wings, all processed meats as bacon, sausage, bologna, salami, fatback, hot dogs, bratwurst and packaged luncheon meats. ° °Dairy °Whole or 2% milk, cream, half-and-half, and cream cheese.   Whole-fat or sweetened yogurt. Full-fat cheeses or blue cheese. Non-dairy creamers and whipped toppings. Processed cheese, cheese spreads, or cheese curds.  Condiments Onion and garlic salt, seasoned salt, table salt, and sea salt. Canned and packaged gravies. Worcestershire sauce. Tartar sauce. Barbecue sauce. Teriyaki sauce. Soy sauce, including reduced sodium. Steak sauce. Fish sauce. Oyster sauce. Cocktail sauce. Horseradish. Ketchup and mustard. Meat flavorings and tenderizers. Bouillon cubes. Hot sauce. Tabasco sauce. Marinades. Taco seasonings. Relishes.  Fats and Oils Butter, stick margarine, lard, shortening and  bacon fat. Coconut, palm kernel, or palm oils. Regular salad dressings.  Pickles and olives. Salted popcorn and pretzels.  The items listed above may not be a complete list of foods and beverages to avoid.   

## 2021-04-09 ENCOUNTER — Other Ambulatory Visit: Payer: Self-pay | Admitting: Internal Medicine

## 2021-04-09 LAB — URINALYSIS, ROUTINE W REFLEX MICROSCOPIC
Bilirubin Urine: NEGATIVE
Glucose, UA: NEGATIVE
Hgb urine dipstick: NEGATIVE
Ketones, ur: NEGATIVE
Leukocytes,Ua: NEGATIVE
Nitrite: NEGATIVE
Protein, ur: NEGATIVE
Specific Gravity, Urine: 1.018 (ref 1.001–1.035)
pH: 5.5 (ref 5.0–8.0)

## 2021-04-09 LAB — VITAMIN D 25 HYDROXY (VIT D DEFICIENCY, FRACTURES): Vit D, 25-Hydroxy: 54 ng/mL (ref 30–100)

## 2021-04-09 LAB — CBC WITH DIFFERENTIAL/PLATELET
Absolute Monocytes: 583 cells/uL (ref 200–950)
Basophils Absolute: 40 cells/uL (ref 0–200)
Basophils Relative: 0.6 %
Eosinophils Absolute: 134 cells/uL (ref 15–500)
Eosinophils Relative: 2 %
HCT: 40.7 % (ref 35.0–45.0)
Hemoglobin: 13.7 g/dL (ref 11.7–15.5)
Lymphs Abs: 1240 cells/uL (ref 850–3900)
MCH: 32.5 pg (ref 27.0–33.0)
MCHC: 33.7 g/dL (ref 32.0–36.0)
MCV: 96.4 fL (ref 80.0–100.0)
MPV: 10.8 fL (ref 7.5–12.5)
Monocytes Relative: 8.7 %
Neutro Abs: 4703 cells/uL (ref 1500–7800)
Neutrophils Relative %: 70.2 %
Platelets: 200 10*3/uL (ref 140–400)
RBC: 4.22 10*6/uL (ref 3.80–5.10)
RDW: 11.8 % (ref 11.0–15.0)
Total Lymphocyte: 18.5 %
WBC: 6.7 10*3/uL (ref 3.8–10.8)

## 2021-04-09 LAB — COMPLETE METABOLIC PANEL WITH GFR
AG Ratio: 2.3 (calc) (ref 1.0–2.5)
ALT: 17 U/L (ref 6–29)
AST: 18 U/L (ref 10–35)
Albumin: 4.6 g/dL (ref 3.6–5.1)
Alkaline phosphatase (APISO): 45 U/L (ref 37–153)
BUN/Creatinine Ratio: 27 (calc) — ABNORMAL HIGH (ref 6–22)
BUN: 28 mg/dL — ABNORMAL HIGH (ref 7–25)
CO2: 26 mmol/L (ref 20–32)
Calcium: 9.3 mg/dL (ref 8.6–10.4)
Chloride: 103 mmol/L (ref 98–110)
Creat: 1.02 mg/dL — ABNORMAL HIGH (ref 0.60–1.00)
Globulin: 2 g/dL (calc) (ref 1.9–3.7)
Glucose, Bld: 94 mg/dL (ref 65–99)
Potassium: 4.5 mmol/L (ref 3.5–5.3)
Sodium: 140 mmol/L (ref 135–146)
Total Bilirubin: 0.4 mg/dL (ref 0.2–1.2)
Total Protein: 6.6 g/dL (ref 6.1–8.1)
eGFR: 58 mL/min/{1.73_m2} — ABNORMAL LOW (ref >=60)

## 2021-04-09 LAB — LIPID PANEL
Cholesterol: 242 mg/dL — ABNORMAL HIGH (ref <200)
HDL: 74 mg/dL (ref >=50)
LDL Cholesterol (Calc): 115 mg/dL (calc) — ABNORMAL HIGH
Non-HDL Cholesterol (Calc): 168 mg/dL (calc) — ABNORMAL HIGH (ref <130)
Total CHOL/HDL Ratio: 3.3 (calc) (ref <5.0)
Triglycerides: 368 mg/dL — ABNORMAL HIGH (ref <150)

## 2021-04-09 LAB — TSH: TSH: 2.83 mIU/L (ref 0.40–4.50)

## 2021-04-09 LAB — MICROALBUMIN / CREATININE URINE RATIO
Creatinine, Urine: 130 mg/dL (ref 20–275)
Microalb Creat Ratio: 4 mcg/mg creat (ref <30)
Microalb, Ur: 0.5 mg/dL

## 2021-04-09 LAB — HEMOGLOBIN A1C
Hgb A1c MFr Bld: 5.4 % of total Hgb (ref <5.7)
Mean Plasma Glucose: 108 mg/dL
eAG (mmol/L): 6 mmol/L

## 2021-04-09 LAB — MAGNESIUM: Magnesium: 2.2 mg/dL (ref 1.5–2.5)

## 2021-04-09 LAB — INSULIN, RANDOM: Insulin: 31.7 u[IU]/mL — ABNORMAL HIGH

## 2021-04-09 NOTE — Progress Notes (Signed)
=============================================================== - Test results slightly outside the reference range are not unusual. If there is anything important, I will review this with you,  otherwise it is considered normal test values.  If you have further questions,  please do not hesitate to contact me at the office or via My Chart.  =============================================================== ===============================================================  -  A1c =5.4% Very Normal - No Diabetes - so insurance will not cover   ( I'm waiting to hear back from [place] about her mother's cost of Ozempic /vial  &                           number of shots /vial to determine the weekly shot & monthly cost    =============================================================== ===============================================================  - Kidney functions  look a little dehydrated    Very important to drink adequate amounts of fluids to prevent permanent damage    - Recommend drink at least 6 bottles (16 ounces) of                                                                           fluids  or water /day = 96 Oz ~100 oz  - 100 oz = 3,000 cc or 3 liters / day  - >>                                                        That's 1 &1/2 bottles of a 2 liter soda bottle /day !  =============================================================== ===============================================================  -  Total  Chol =  242   - Elevated             (  Ideal  or  Goal is less than 180  !  )   - and   -  Bad / Dangerous LDL  Chol 115   - also Elevated              (  Ideal  or  Goal is less than 70  !  )    - I tried to sent a Rx for a new & different Chol Medicine called Nexletol,                                                     but your Ins company won't pay for it,  So  - If you agree, I would like to refer you to the   [facility] Cardiology -  Lipid clinic run by Dr  Debara Pickett and he's been instrumental   in helping patients get approval for some of the New meds to help lower cholesterol .  For example,  there are 2 shots to lower cholesterol - one is a weekly shot  &   The other is every 2 weeks - The results have been remarkable !   - Important to lower cholesterol to prevent Heart  Attacks - still the #1 killer in  [place] and Strokes & kidney failure  - and even more importantly to prevent Alzheimer's Dementia which is    now felt to be a vascular disease from Cholesterol  clogging up all     of the tiny little capillary blood vessels in the brain   - So very important to lower Cholesterol to help prevent Dementia ! =============================================================== ===============================================================  -  Still Diet is very IMPORTANT !   - Cholesterol only comes from animal sources  - ie. meat, dairy, egg yolks  - Eat all the vegetables you want.  - Avoid meat, especially red meat - Beef AND Pork .  - Avoid cheese & dairy - milk & ice cream.     - Cheese is the most concentrated form of trans-fats which  is the worst thing to clog up our arteries.   - Veggie cheese is OK which can be found in the fresh  produce section at [employer] or [employer] or [employer] =============================================================== ===============================================================  -  Vitamin D = 54 - is a little Low   - Vitamin D goal is between 70-100.   - Please make sure that you are taking your                                                           Vitamin D 10,000 units /day as directed.    - It is very important as a natural anti-inflammatory and helping the                     immune system protect against viral infections, like the Covid-19    helping hair, skin, and nails, as well as reducing stroke and                                                                                            heart attack risk.   - It helps your bones and helps with mood.  - It also decreases numerous cancer risks so please  take it as directed.   - Low Vit D is associated with a 200-300% higher risk for  CANCER   and 200-300% higher risk for HEART   ATTACK  &  STROKE.    - It is also associated with higher death rate at younger ages,   autoimmune diseases like Rheumatoid arthritis, Lupus,  Multiple Sclerosis.     - Also many other serious conditions, like depression, Alzheimer's  Dementia, infertility, muscle aches, fatigue, fibromyalgia   - just to name a few. =============================================================== ===============================================================  -  All Else - CBC - Kidneys - Electrolytes - Liver - Magnesium & Thyroid    - all  Normal / OK  =============================================================== ===============================================================

## 2021-04-15 ENCOUNTER — Encounter: Payer: Self-pay | Admitting: Internal Medicine

## 2021-04-15 ENCOUNTER — Other Ambulatory Visit: Payer: Self-pay | Admitting: Internal Medicine

## 2021-04-15 DIAGNOSIS — E782 Mixed hyperlipidemia: Secondary | ICD-10-CM

## 2021-05-04 DIAGNOSIS — H43812 Vitreous degeneration, left eye: Secondary | ICD-10-CM | POA: Diagnosis not present

## 2021-05-04 DIAGNOSIS — H2513 Age-related nuclear cataract, bilateral: Secondary | ICD-10-CM | POA: Diagnosis not present

## 2021-05-04 DIAGNOSIS — H353131 Nonexudative age-related macular degeneration, bilateral, early dry stage: Secondary | ICD-10-CM | POA: Diagnosis not present

## 2021-05-10 ENCOUNTER — Encounter: Payer: Medicare Other | Admitting: Nurse Practitioner

## 2021-05-11 ENCOUNTER — Ambulatory Visit (HOSPITAL_BASED_OUTPATIENT_CLINIC_OR_DEPARTMENT_OTHER): Payer: Medicare Other | Admitting: Internal Medicine

## 2021-05-11 ENCOUNTER — Encounter (HOSPITAL_BASED_OUTPATIENT_CLINIC_OR_DEPARTMENT_OTHER): Payer: Self-pay | Admitting: Internal Medicine

## 2021-05-11 ENCOUNTER — Other Ambulatory Visit: Payer: Self-pay

## 2021-05-11 VITALS — BP 126/84 | HR 64 | Ht 62.05 in | Wt 200.6 lb

## 2021-05-11 DIAGNOSIS — T466X5A Adverse effect of antihyperlipidemic and antiarteriosclerotic drugs, initial encounter: Secondary | ICD-10-CM

## 2021-05-11 DIAGNOSIS — E785 Hyperlipidemia, unspecified: Secondary | ICD-10-CM

## 2021-05-11 DIAGNOSIS — M791 Myalgia, unspecified site: Secondary | ICD-10-CM

## 2021-05-11 NOTE — Progress Notes (Signed)
? ? ?LIPID CLINIC CONSULT NOTE ? ?Chief Complaint:  ?Manage dyslipidemia ? ?Primary Care Physician: ?Kathy Pinto, MD ? ?Primary Cardiologist:  ?None ? ?HPI:  ?Kathy Cook is a 76 y.o. female who is being seen today for the evaluation of dyslipidemia at the request of Kathy Pinto, MD. This is a 76 year old female with a history of longstanding dyslipidemia that has not necessarily improved with diet and weight loss in the past.  She has no history of early onset heart disease.  She is tried statins in the past and had significant side effects with it.  She also took Zetia which caused severe IBS symptoms which lasted for about a year.  Subsequently she has not been on any lipid-lowering therapy recently.  She reports having had a look into her high cholesterol in the past and had specifically particle testing which showed she had "large buoyant particles", suggestive of lower risk dyslipidemia.  She has had longstanding high HDL cholesterol.  Triglycerides have also been initiated more recently were elevated.  The most recent labs show total cholesterol 242, HDL 74, triglycerides 368 and LDL 115.  Lipids from 2 years ago however showed total cholesterol over 300 with HDL 100 and LDL 186 calculated. ? ?PMHx:  ?Past Medical History:  ?Diagnosis Date  ? Allergy   ? Anxiety   ? Attention deficit disorder (ADD)   ? Cancer [facility])   ? ? basal/squam cell on chest, and forehead  ? Depression   ? Dry eyes   ? GERD (gastroesophageal reflux disease)   ? diet controlled, no meds  ? HLD (hyperlipidemia)   ? HSV infection   ? Lumbar degenerative disc disease   ? Ocular rosacea   ? Osteopenia   ? ? ?Past Surgical History:  ?Procedure Laterality Date  ? BREAST LUMPECTOMY Left 07/18/2019  ? Procedure: LEFT BREAST LUMPECTOMY;  Surgeon: Kathy Keens, MD;  Location: [place];  Service: General;  Laterality: Left;  LMA  ? COLONOSCOPY  2019  ? RE-EXCISION OF BREAST LUMPECTOMY Left 09/05/2019  ? Procedure:  RE-EXCISION OF LEFT BREAST TUMOR;  Surgeon: Kathy Keens, MD;  Location: [place];  Service: General;  Laterality: Left;  LMA  ? SKIN BIOPSY    ? ?basal/squam cell on chest  ? TONSILLECTOMY AND ADENOIDECTOMY    ? TOTAL KNEE ARTHROPLASTY Right 03/04/2019  ? Procedure: RIGHT TOTAL KNEE ARTHROPLASTY;  Surgeon: Kathy Killings, MD;  Location: [place];  Service: Orthopedics;  Laterality: Right;  ? UPPER GI ENDOSCOPY    ? ? ?FAMHx:  ?Family History  ?Problem Relation Age of Onset  ? Diabetes Father   ? Hypertension Mother   ? Depression Mother   ? Macular degeneration Mother   ? Heart disease Mother   ? ? ?SOCHx:  ? reports that she quit smoking about 41 years ago. Her smoking use included cigarettes. She has a 7.50 pack-year smoking history. She has never used smokeless tobacco. She reports current alcohol use. She reports that she does not use drugs. ? ?ALLERGIES:  ?Allergies  ?Allergen Reactions  ? Zetia [Ezetimibe] Diarrhea  ?  Patient states she had IBS, that took a year to resolve.  ? Prednisone Nausea And Vomiting  ? Statins   ?  paralyzing  ? Strattera [Atomoxetine Hcl]   ?  Increased sadness  ? ? ?ROS: ?Pertinent items noted in HPI and remainder of comprehensive ROS otherwise negative. ? ?HOME MEDS: ?Current Outpatient Medications on File Prior to Visit  ?Medication  Sig Dispense Refill  ? amphetamine-dextroamphetamine (ADDERALL) 30 MG tablet Take 1/2 to 1 tablet 2 x /day as needed for Focus & Concentration TEVA BRAND ONLY 60 tablet 0  ? celecoxib (CELEBREX) 200 MG capsule Take 200 mg by mouth 2 (two) times daily.    ? Cholecalciferol (DIALYVITE VITAMIN D 5000) 125 MCG (5000 UT) capsule Take 10,000 Units by mouth daily.    ? famciclovir (FAMVIR) 500 MG tablet Take 1 tablet (500 mg total) by mouth 2 (two) times daily as needed (fever blisters). 60 tablet 1  ? fexofenadine (ALLEGRA) 180 MG tablet Take 180 mg by mouth daily.    ? tamoxifen (NOLVADEX) 20 MG tablet Take 1 tablet (20 mg total) by mouth daily. 90 tablet 4  ?  venlafaxine XR (EFFEXOR-XR) 75 MG 24 hr capsule Take 1 capsule (75 mg total) by mouth daily with breakfast. 30 capsule 3  ? ?No current facility-administered medications on file prior to visit.  ? ? ?LABS/IMAGING: ?No results found for this or any previous visit (from the past 48 hour(s)). ?No results found. ? ?LIPID PANEL: ?   ?Component Value Date/Time  ? CHOL 242 (H) 04/08/2021 1457  ? TRIG 368 (H) 04/08/2021 1457  ? HDL 74 04/08/2021 1457  ? CHOLHDL 3.3 04/08/2021 1457  ? VLDL 46 (H) 10/04/2016 1005  ? [place] 115 (H) 04/08/2021 1457  ? ? ?WEIGHTS: ?Wt Readings from Last 3 Encounters:  ?05/11/21 200 lb 9.6 oz (91 kg)  ?04/08/21 203 lb 6.4 oz (92.3 kg)  ?11/19/20 207 lb 3.2 oz (94 kg)  ? ? ?VITALS: ?BP 126/84   Pulse 64   Ht 5' 2.05" (1.576 m)   Wt 200 lb 9.6 oz (91 kg)   LMP  (LMP Unknown)   SpO2 98%   BMI 36.63 kg/m?  ? ?EXAM: ?General appearance: alert and no distress ?Neck: no carotid bruit, no JVD, and thyroid not enlarged, symmetric, no tenderness/mass/nodules ?Lungs: clear to auscultation bilaterally ?Heart: regular rate and rhythm, S1, S2 normal, no murmur, click, rub or gallop ?Abdomen: soft, non-tender; bowel sounds normal; no masses,  no organomegaly ?Extremities: extremities normal, atraumatic, no cyanosis or edema ?Pulses: 2+ and symmetric ?Skin: Skin color, texture, turgor normal. No rashes or lesions ?Neurologic: Grossly normal ?Psych: Pleasant ? ?EKG: ?Deferred ? ?ASSESSMENT: ?Mixed dyslipidemia with high triglycerides ?Low traditional coronary risk ?Statin and ezetimibe intolerant ? ?PLAN: ?1.   Kathy Cook I believe is at low traditional coronary risk.  She has no diabetes, hypertension or other significant risk factors other than age.  No significant early onset heart disease in her family.  She does have a high HDL cholesterol which was as high as 102 years ago.  Triglycerides have crept up a little bit.  This does suggest genetic triglyceride disorder.  Unfortunately, she cannot take  statins or ezetimibe.  I am not sure we need to be very aggressive about her lipids.  I would like to get a calcium score to further restratify her.  This is 0 very low, then we may be able to continue with her dietary and lifestyle measures.  If not, we can consider a nonstatin such as Nexletol.  She would not qualify for PCSK9 inhibitor.  Plan follow-up with me afterwards as needed. ? ?Thanks again for the kind referral. ? ?Pixie Casino, MD, [facility], FACP  ?[place]  ?Medical Director of the Advanced Lipid Disorders &  ?Cardiovascular Risk Reduction Clinic ?Diplomate of the [employer] of Clinical Lipidology ?  Attending Cardiologist  ?Direct Dial: [phone]  Fax: [phone]  ?Website:  [email or web address] ? ?Nadean Corwin Judeth Gilles ?05/11/2021, 10:27 AM  ?

## 2021-05-11 NOTE — Patient Instructions (Signed)
Medication Instructions:  ?NO CHANGES ? ?*If you need a refill on your cardiac medications before your next appointment, please call your pharmacy* ? ?Testing/Procedures: ?Dr. Debara Pickett has ordered a CT coronary calcium score.  ? ?Test locations:  ?[facility] ([address] [place], [place] [address]) ?[facility] ([address] [place], [place] [address])  ? ?This is $99 out of pocket. ? ? ?Coronary CalciumScan ?A coronary calcium scan is an imaging test used to look for deposits of calcium and other fatty materials (plaques) in the inner lining of the blood vessels of the heart (coronary arteries). These deposits of calcium and plaques can partly clog and narrow the coronary arteries without producing any symptoms or warning signs. This puts a person at risk for a heart attack. This test can detect these deposits before symptoms develop. ?Tell a health care provider about: ?Any allergies you have. ?All medicines you are taking, including vitamins, herbs, eye drops, creams, and over-the-counter medicines. ?Any problems you or family members have had with anesthetic medicines. ?Any blood disorders you have. ?Any surgeries you have had. ?Any medical conditions you have. ?Whether you are pregnant or may be pregnant. ?What are the risks? ?Generally, this is a safe procedure. However, problems may occur, including: ?Harm to a pregnant woman and her unborn baby. This test involves the use of radiation. Radiation exposure can be dangerous to a pregnant woman and her unborn baby. If you are pregnant, you generally should not have this procedure done. ?Slight increase in the risk of cancer. This is because of the radiation involved in the test. ?What happens before the procedure? ?No preparation is needed for this procedure. ?What happens during the procedure? ?You will undress and remove any jewelry around your neck or chest. ?You will put on a hospital gown. ?Sticky electrodes will be placed on your  chest. The electrodes will be connected to an electrocardiogram (ECG) machine to record a tracing of the electrical activity of your heart. ?A CT scanner will take pictures of your heart. During this time, you will be asked to lie still and hold your breath for 2-3 seconds while a picture of your heart is being taken. ?The procedure may vary among health care providers and hospitals. ?What happens after the procedure? ?You can get dressed. ?You can return to your normal activities. ?It is up to you to get the results of your test. Ask your health care provider, or the department that is doing the test, when your results will be ready. ?Summary ?A coronary calcium scan is an imaging test used to look for deposits of calcium and other fatty materials (plaques) in the inner lining of the blood vessels of the heart (coronary arteries). ?Generally, this is a safe procedure. Tell your health care provider if you are pregnant or may be pregnant. ?No preparation is needed for this procedure. ?A CT scanner will take pictures of your heart. ?You can return to your normal activities after the scan is done. ?This information is not intended to replace advice given to you by your health care provider. Make sure you discuss any questions you have with your health care provider. ?Document Released: 08/13/2007 Document Revised: 01/04/2016 Document Reviewed: 01/04/2016 ?Elsevier Interactive Patient Education ? 2017 Elsevier Inc[employer] ? ? ? ?Follow-Up: ?At [facility], you and your health needs are our priority.  As part of our continuing mission to provide you with exceptional heart care, we have created designated Provider Care Teams.  These Care Teams include your  primary Cardiologist (physician) and Advanced Practice Providers (APPs -  Physician Assistants and Nurse Practitioners) who all work together to provide you with the care you need, when you need it. ? ?We recommend signing up for the patient portal called "MyChart".  Sign  up information is provided on this After Visit Summary.  MyChart is used to connect with patients for Virtual Visits (Telemedicine).  Patients are able to view lab/test results, encounter notes, upcoming appointments, etc.  Non-urgent messages can be sent to your provider as well.   ?To learn more about what you can do with MyChart, go to [email or web address].   ? ?AS NEEDED with Dr. Debara Pickett  ? ?

## 2021-05-21 ENCOUNTER — Other Ambulatory Visit: Payer: Self-pay | Admitting: Hematology and Oncology

## 2021-05-21 DIAGNOSIS — Z853 Personal history of malignant neoplasm of breast: Secondary | ICD-10-CM

## 2021-06-14 ENCOUNTER — Ambulatory Visit
Admission: RE | Admit: 2021-06-14 | Discharge: 2021-06-14 | Disposition: A | Discharge status: Home / Self Care | Payer: Medicare Other | Source: Ambulatory Visit | Attending: Hematology and Oncology | Admitting: Hematology and Oncology

## 2021-06-14 DIAGNOSIS — Z853 Personal history of malignant neoplasm of breast: Secondary | ICD-10-CM

## 2021-06-14 DIAGNOSIS — R922 Inconclusive mammogram: Secondary | ICD-10-CM | POA: Diagnosis not present

## 2021-06-14 HISTORY — DX: Personal history of irradiation: Z92.3

## 2021-07-01 ENCOUNTER — Telehealth: Payer: Self-pay | Admitting: Hematology and Oncology

## 2021-07-01 NOTE — Telephone Encounter (Signed)
Rescheduled appointment per providers template. Left message.  ? ?

## 2021-07-05 ENCOUNTER — Other Ambulatory Visit: Payer: Self-pay

## 2021-07-05 DIAGNOSIS — D0512 Intraductal carcinoma in situ of left breast: Secondary | ICD-10-CM

## 2021-07-05 MED ORDER — TAMOXIFEN CITRATE 20 MG PO TABS: 20.0000 mg | ORAL_TABLET | Freq: Every day | ORAL | 4 refills | Status: DC

## 2021-07-12 ENCOUNTER — Other Ambulatory Visit: Payer: Self-pay | Admitting: Internal Medicine

## 2021-07-12 ENCOUNTER — Ambulatory Visit (INDEPENDENT_AMBULATORY_CARE_PROVIDER_SITE_OTHER)
Admission: RE | Admit: 2021-07-12 | Discharge: 2021-07-12 | Disposition: A | Discharge status: Home / Self Care | Payer: Self-pay | Source: Ambulatory Visit | Attending: Internal Medicine | Admitting: Internal Medicine

## 2021-07-12 DIAGNOSIS — R911 Solitary pulmonary nodule: Secondary | ICD-10-CM

## 2021-07-12 DIAGNOSIS — E785 Hyperlipidemia, unspecified: Secondary | ICD-10-CM

## 2021-07-16 ENCOUNTER — Ambulatory Visit: Payer: Medicare Other | Admitting: Hematology and Oncology

## 2021-07-16 NOTE — Progress Notes (Signed)
Patient Care Team: Unk Pinto, MD as PCP - General (Internal Medicine) Dian Queen, MD as Consulting Physician (Obstetrics and Gynecology) Clent Jacks, MD as Consulting Physician (Ophthalmology) Lindwood Coke, MD as Consulting Physician (Dermatology) Magrinat, Virgie Dad, MD (Inactive) as Consulting Physician (Oncology) Coralie Keens, MD as Consulting Physician (General Surgery) Eppie Gibson, MD as Attending Physician (Radiation Oncology) [name], MD as Consulting Physician (Orthopedic Surgery) Mauro Kaufmann, RN as Oncology Nurse Navigator Rockwell Germany, RN as Oncology Nurse Navigator  DIAGNOSIS:  Encounter Diagnosis  Name Primary?   Ductal carcinoma in situ (DCIS) of left breast      CHIEF COMPLIANT: DCIS breast cancer, phyllodes tumor. On Tamoxifen.  INTERVAL HISTORY: Kathy Cook is a 76 y.o. with the above mention estrogen receptor positive breast cancer, phyllodes tumor. She presents to the clinic today for a follow-up. She complains of pain in the back side and chest. States that the pain last for a while. States that she has fatigue. Complains she always have to take a nap. States that she has 1 or 2 hot flashes a day. States that the hot flashes are awful. States that she cant walk 5000 steps a day. States its affecting her life activities.   ALLERGIES:  is allergic to zetia [ezetimibe], prednisone, statins, and strattera [atomoxetine hcl].  MEDICATIONS:  Current Outpatient Medications  Medication Sig Dispense Refill   amphetamine-dextroamphetamine (ADDERALL) 30 MG tablet Take 1/2 to 1 tablet 2 x /day as needed for Focus & Concentration TEVA BRAND ONLY 60 tablet 0   celecoxib (CELEBREX) 200 MG capsule Take 200 mg by mouth 2 (two) times daily.     Cholecalciferol (DIALYVITE VITAMIN D 5000) 125 MCG (5000 UT) capsule Take 10,000 Units by mouth daily.     famciclovir (FAMVIR) 500 MG tablet Take 1 tablet (500 mg total) by mouth 2 (two) times daily  as needed (fever blisters). 60 tablet 1   fexofenadine (ALLEGRA) 180 MG tablet Take 180 mg by mouth daily.     venlafaxine XR (EFFEXOR-XR) 37.5 MG 24 hr capsule Take 1 capsule (37.5 mg total) by mouth daily with breakfast. 90 capsule 0   No current facility-administered medications for this visit.    PHYSICAL EXAMINATION: ECOG PERFORMANCE STATUS: 1 - Symptomatic but completely ambulatory  Vitals:   07/23/21 1049  BP: (!) 146/68  Pulse: 76  Resp: 18  Temp: (!) 97.3 F (36.3 C)  SpO2: 97%   Filed Weights   07/23/21 1049  Weight: 196 lb 4.8 oz (89 kg)      LABORATORY DATA:  I have reviewed the data as listed    Latest Ref Rng & Units 04/08/2021    2:57 PM 11/19/2020    3:34 PM 05/07/2020    4:30 PM  CMP  Glucose 65 - 99 mg/dL 94   104   95    BUN 7 - 25 mg/dL '28   26   20    '$ Creatinine 0.60 - 1.00 mg/dL 1.02   0.86   0.91    Sodium 135 - 146 mmol/L 140   141   142    Potassium 3.5 - 5.3 mmol/L 4.5   4.8   4.6    Chloride 98 - 110 mmol/L 103   104   105    CO2 20 - 32 mmol/L '26   29   27    '$ Calcium 8.6 - 10.4 mg/dL 9.3   9.8   9.4    Total Protein  6.1 - 8.1 g/dL 6.6   6.3   6.5    Total Bilirubin 0.2 - 1.2 mg/dL 0.4   0.3   0.3    AST 10 - 35 U/L '18   11   13    '$ ALT 6 - 29 U/L '17   13   10      '$ Lab Results  Component Value Date   WBC 6.7 04/08/2021   HGB 13.7 04/08/2021   HCT 40.7 04/08/2021   MCV 96.4 04/08/2021   PLT 200 04/08/2021   NEUTROABS 4,703 04/08/2021    ASSESSMENT & PLAN:  Ductal carcinoma in situ (DCIS) of left breast 07/26/2019: Left lumpectomy: DCIS 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  Severe fatigue: She is unable to enjoy her life because of the fatigue.  I instructed her how to stop tamoxifen at this time. 2. severe hot  flashes: She also wants to cut down on the Effexor and discontinue it.  I gave her a tapering schedule.  Breast cancer surveillance: 1.  Breast exam 07/23/2021: Benign 2. mammogram 06/14/2021: Benign breast density category C  Return to clinic in October for breast exams and follow-up and after that we can see her once a year    No orders of the defined types were placed in this encounter.  The patient has a good understanding of the overall plan. she agrees with it. she will call with any problems that may develop before the next visit here. Total time spent: 30 mins including face to face time and time spent for planning, charting and co-ordination of care   Kathy Ohara, MD 07/23/21    I Gardiner Coins am scribing for Dr. Lindi Adie  I have reviewed the above documentation for accuracy and completeness, and I agree with the above.

## 2021-07-21 ENCOUNTER — Other Ambulatory Visit: Payer: Self-pay

## 2021-07-21 DIAGNOSIS — R911 Solitary pulmonary nodule: Secondary | ICD-10-CM

## 2021-07-21 NOTE — Progress Notes (Signed)
Reordered test per request. The ordered test was incorrect.

## 2021-07-23 ENCOUNTER — Inpatient Hospital Stay: Payer: Medicare Other | Attending: Hematology and Oncology | Admitting: Hematology and Oncology

## 2021-07-23 ENCOUNTER — Other Ambulatory Visit: Payer: Self-pay

## 2021-07-23 DIAGNOSIS — R5383 Other fatigue: Secondary | ICD-10-CM | POA: Insufficient documentation

## 2021-07-23 DIAGNOSIS — R232 Flushing: Secondary | ICD-10-CM | POA: Diagnosis not present

## 2021-07-23 DIAGNOSIS — Z79810 Long term (current) use of selective estrogen receptor modulators (SERMs): Secondary | ICD-10-CM | POA: Diagnosis not present

## 2021-07-23 DIAGNOSIS — Z923 Personal history of irradiation: Secondary | ICD-10-CM | POA: Insufficient documentation

## 2021-07-23 DIAGNOSIS — D0512 Intraductal carcinoma in situ of left breast: Secondary | ICD-10-CM | POA: Insufficient documentation

## 2021-07-23 MED ORDER — VENLAFAXINE HCL ER 37.5 MG PO CP24: 37.5000 mg | ORAL_CAPSULE | Freq: Every day | ORAL | 0 refills | Status: DC

## 2021-07-23 NOTE — Assessment & Plan Note (Addendum)
07/26/2019: Left lumpectomy: DCIS 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  Severe fatigue: She is unable to enjoy her life because of the fatigue.  I instructed her how to stop tamoxifen at this time. 2. severe hot flashes: She also wants to cut down on the Effexor and discontinue it.  I gave her a tapering schedule.  Breast cancer surveillance: 1.  Breast exam 07/23/2021: Benign 2. mammogram 06/14/2021: Benign breast density category C  Return to clinic in October for breast exams and follow-up and after that we can see her once a year

## 2021-08-10 ENCOUNTER — Other Ambulatory Visit: Payer: Self-pay | Admitting: Internal Medicine

## 2021-10-15 ENCOUNTER — Ambulatory Visit (HOSPITAL_BASED_OUTPATIENT_CLINIC_OR_DEPARTMENT_OTHER): Payer: Medicare Other | Admitting: Internal Medicine

## 2021-10-28 ENCOUNTER — Other Ambulatory Visit: Payer: Self-pay | Admitting: Hematology and Oncology

## 2021-11-17 NOTE — Progress Notes (Signed)
Patient Care Team: Unk Pinto, MD as PCP - General (Internal Medicine) Dian Queen, MD as Consulting Physician (Obstetrics and Gynecology) Clent Jacks, MD as Consulting Physician (Ophthalmology) Lindwood Coke, MD as Consulting Physician (Dermatology) Magrinat, Virgie Dad, MD (Inactive) as Consulting Physician (Oncology) Coralie Keens, MD as Consulting Physician (General Surgery) Eppie Gibson, MD as Attending Physician (Radiation Oncology) [name], MD as Consulting Physician (Orthopedic Surgery) Mauro Kaufmann, RN as Oncology Nurse Navigator Rockwell Germany, RN as Oncology Nurse Navigator  DIAGNOSIS: No diagnosis found.  SUMMARY OF ONCOLOGIC HISTORY: Oncology History   No history exists.    CHIEF COMPLIANT:   INTERVAL HISTORY: Kathy Cook is a   ALLERGIES:  is allergic to zetia [ezetimibe], prednisone, statins, and strattera [atomoxetine hcl].  MEDICATIONS:  Current Outpatient Medications  Medication Sig Dispense Refill   amphetamine-dextroamphetamine (ADDERALL) 30 MG tablet Take 1/2 to 1 tablet 2 x /day as needed for Focus & Concentration TEVA BRAND ONLY 60 tablet 0   celecoxib (CELEBREX) 200 MG capsule Take 200 mg by mouth 2 (two) times daily.     Cholecalciferol (DIALYVITE VITAMIN D 5000) 125 MCG (5000 UT) capsule Take 10,000 Units by mouth daily.     famciclovir (FAMVIR) 500 MG tablet Take 1 tablet (500 mg total) by mouth 2 (two) times daily as needed (fever blisters). 60 tablet 1   fexofenadine (ALLEGRA) 180 MG tablet Take 180 mg by mouth daily.     venlafaxine XR (EFFEXOR-XR) 37.5 MG 24 hr capsule TAKE 1 CAPSULE(37.5 MG) BY MOUTH DAILY WITH BREAKFAST 90 capsule 0   No current facility-administered medications for this visit.    PHYSICAL EXAMINATION: ECOG PERFORMANCE STATUS: {CHL ONC ECOG [phone]}  There were no vitals filed for this visit. There were no vitals filed for this visit.  BREAST:*** No palpable masses or nodules in  either right or left breasts. No palpable axillary supraclavicular or infraclavicular adenopathy no breast tenderness or nipple discharge. (exam performed in the presence of a chaperone)  LABORATORY DATA:  I have reviewed the data as listed    Latest Ref Rng & Units 04/08/2021    2:57 PM 11/19/2020    3:34 PM 05/07/2020    4:30 PM  CMP  Glucose 65 - 99 mg/dL 94  104  95   BUN 7 - 25 mg/dL '28  26  20   '$ Creatinine 0.60 - 1.00 mg/dL 1.02  0.86  0.91   Sodium 135 - 146 mmol/L 140  141  142   Potassium 3.5 - 5.3 mmol/L 4.5  4.8  4.6   Chloride 98 - 110 mmol/L 103  104  105   CO2 20 - 32 mmol/L '26  29  27   '$ Calcium 8.6 - 10.4 mg/dL 9.3  9.8  9.4   Total Protein 6.1 - 8.1 g/dL 6.6  6.3  6.5   Total Bilirubin 0.2 - 1.2 mg/dL 0.4  0.3  0.3   AST 10 - 35 U/L '18  11  13   '$ ALT 6 - 29 U/L '17  13  10     '$ Lab Results  Component Value Date   WBC 6.7 04/08/2021   HGB 13.7 04/08/2021   HCT 40.7 04/08/2021   MCV 96.4 04/08/2021   PLT 200 04/08/2021   NEUTROABS 4,703 04/08/2021    ASSESSMENT & PLAN:  No problem-specific Assessment & Plan notes found for this encounter.    No orders of the defined types were placed in this encounter.  The patient has a good understanding of the overall plan. she agrees with it. she will call with any problems that may develop before the next visit here. Total time spent: 30 mins including face to face time and time spent for planning, charting and co-ordination of care   Suzzette Righter, [place] 11/17/21    I Gardiner Coins am scribing for Dr. Lindi Adie  ***

## 2021-11-18 ENCOUNTER — Other Ambulatory Visit: Payer: Self-pay

## 2021-11-18 ENCOUNTER — Inpatient Hospital Stay: Payer: Medicare Other | Attending: Hematology and Oncology | Admitting: Hematology and Oncology

## 2021-11-18 DIAGNOSIS — Z923 Personal history of irradiation: Secondary | ICD-10-CM | POA: Diagnosis not present

## 2021-11-18 DIAGNOSIS — R232 Flushing: Secondary | ICD-10-CM | POA: Insufficient documentation

## 2021-11-18 DIAGNOSIS — Z86000 Personal history of in-situ neoplasm of breast: Secondary | ICD-10-CM | POA: Diagnosis not present

## 2021-11-18 DIAGNOSIS — M546 Pain in thoracic spine: Secondary | ICD-10-CM | POA: Insufficient documentation

## 2021-11-18 DIAGNOSIS — M791 Myalgia, unspecified site: Secondary | ICD-10-CM | POA: Insufficient documentation

## 2021-11-18 DIAGNOSIS — D0512 Intraductal carcinoma in situ of left breast: Secondary | ICD-10-CM | POA: Diagnosis not present

## 2021-11-18 DIAGNOSIS — R5383 Other fatigue: Secondary | ICD-10-CM | POA: Insufficient documentation

## 2021-11-18 NOTE — Assessment & Plan Note (Signed)
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  Severe fatigue: She is unable to enjoy her life because of the fatigue.  I instructed her how to stop tamoxifen at this time. 2. severe hot flashes: She also wants to cut down on the Effexor and discontinue it.  I gave her a tapering schedule.  Breast cancer surveillance: 1.  Breast exam 11/18/2021: Benign 2. mammogram 06/14/2021: Benign breast density category C  Return to clinic on an as-needed basis

## 2021-11-19 ENCOUNTER — Ambulatory Visit: Payer: Medicare Other | Admitting: Nurse Practitioner

## 2021-11-22 NOTE — Progress Notes (Deleted)
MEDICARE WELLNESS and follow up  Assessment:      Future Appointments  Date Time Provider [place]  11/23/2021  3:30 PM Alycia Rossetti, NP [facility] None  04/14/2022  3:00 PM Unk Pinto, MD [facility] None      Plan:   During the course of the visit the patient was educated and counseled about appropriate screening and preventive services including:   Pneumococcal vaccine  Prevnar 13 Influenza vaccine Td vaccine Screening electrocardiogram Bone densitometry screening Colorectal cancer screening Diabetes screening Glaucoma screening Nutrition counseling  Advanced directives: requested    Subjective:   Kathy Cook is a 76 y.o. female who presents for Medicare wellness and 3 month follow up on hypertension, hyperlipidemia, vitamin D def.   BMI is There is no height or weight on file to calculate BMI., she is working on diet and exercise. Wt Readings from Last 3 Encounters:  11/18/21 198 lb (89.8 kg)  07/23/21 196 lb 4.8 oz (89 kg)  05/11/21 200 lb 9.6 oz (91 kg)   She is S/p lumpectomy with radiation on her left breast, no longer on hormones due to DC in situ, started on effexor 37.'5mg'$  by Dr. Griffith Citron.   She does well on the Adderall, helps with concentration however has not been taking.  Has HSV 1 and has famcyclovir for occ out break.    Her blood pressure has been controlled at home, today their BP is   She does workout, walks. She denies chest pain, shortness of breath, dizziness.  She is not on cholesterol medication, can not tolerate statins and could not tolerate zetia due to diarrhea. No family history.  Her cholesterol is not at goal. The cholesterol last visit was:   Lab Results  Component Value Date   CHOL 242 (H) 04/08/2021   HDL 74 04/08/2021   LDLCALC 115 (H) 04/08/2021   TRIG 368 (H) 04/08/2021   CHOLHDL 3.3 04/08/2021   Last A1C in the office was:  Lab Results  Component Value Date   HGBA1C 5.4 04/08/2021   Patient is on  Vitamin D supplement. Lab Results  Component Value Date   VD25OH 54 04/08/2021    Names of Other Physician/Practitioners you currently use: 1. [facility]- here for primary care 2. [facility], eye doctor, 04/2019 3.  Dr. Johnnye Sima, dentist, last visit q 6 months Patient Care Team: Unk Pinto, MD as PCP - General (Internal Medicine) Dian Queen, MD as Consulting Physician (Obstetrics and Gynecology) Clent Jacks, MD as Consulting Physician (Ophthalmology) Lindwood Coke, MD as Consulting Physician (Dermatology) Magrinat, Virgie Dad, MD (Inactive) as Consulting Physician (Oncology) Coralie Keens, MD as Consulting Physician (General Surgery) Eppie Gibson, MD as Attending Physician (Radiation Oncology) [name], MD as Consulting Physician (Orthopedic Surgery) Mauro Kaufmann, RN as Oncology Nurse Navigator Rockwell Germany, RN as Oncology Nurse Navigator  Medication Review     Current Outpatient Medications (Analgesics):    celecoxib (CELEBREX) 200 MG capsule, Take 200 mg by mouth 2 (two) times daily.   Current Outpatient Medications (Other):    amphetamine-dextroamphetamine (ADDERALL) 30 MG tablet, Take 1/2 to 1 tablet 2 x /day as needed for Focus & Concentration TEVA BRAND ONLY   Cholecalciferol (DIALYVITE VITAMIN D 5000) 125 MCG (5000 UT) capsule, Take 10,000 Units by mouth daily.   famciclovir (FAMVIR) 500 MG tablet, Take 1 tablet (500 mg total) by mouth 2 (two) times daily as needed (fever blisters).  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   Ductal carcinoma in situ (DCIS) of left breast 08/06/2019   Status post total hip replacement, right 03/05/2019   Arthritis of right knee 03/04/2019   Unilateral primary osteoarthritis, right knee 02/05/2019   Knee locking, right 01/09/2019   Metatarsal stress fracture,  right, initial encounter 10/03/2016   DJD (degenerative joint disease) 06/09/2016   Labile hypertension 08/01/2014   Vitamin D deficiency 08/01/2014   Medication management 08/01/2014   Obesity 07/04/2014   Hyperlipemia 06/27/2013   Attention deficit disorder (ADD)    GERD (gastroesophageal reflux disease)    Allergy    Depression, major, in remission ([place])    Anemia    Ocular rosacea    HSV infection    Osteopenia     Screening Tests Immunization History  Administered Date(s) Administered   DT (Pediatric) 01/14/2015   Influenza, High Dose Seasonal PF 04/30/2018, 11/13/2018, 01/06/2020   PFIZER(Purple Top)SARS-COV-2 Vaccination 04/07/2019, 05/01/2019   Pneumococcal Conjugate-13 03/24/2015   Pneumococcal-Unspecified 12/31/2012   Td 03/29/2004    Preventative care: Last colonoscopy: 06/2017 last colonoscopy per Dr. Collene Mares Last mammogram: 07/2018  at [facility] pap smear/pelvic exam: 2020 at [facility] Dr. Tressia Danas DEXA 2019 getting next year at [facility]  Prior vaccinations: TD or Tdap: 2016  Influenza: 2020 Pneumococcal: 2014 Prevnar 13: 2017 Shingles/Zostavax: DUE  Allergies Allergies  Allergen Reactions   Zetia [Ezetimibe] Diarrhea    Patient states she had IBS, that took a year to resolve.   Prednisone Nausea And Vomiting   Statins Other (See Comments)    paralyzing   Strattera [Atomoxetine Hcl]     Increased sadness    SURGICAL HISTORY She  has a past surgical history that includes Tonsillectomy and adenoidectomy; Skin biopsy; Total knee arthroplasty (Right, 03/04/2019); Breast lumpectomy (Left, 07/18/2019); Colonoscopy (2019); Upper gi endoscopy; Re-excision of breast lumpectomy (Left, 09/05/2019); and Breast biopsy (Left, 05/28/2019). FAMILY HISTORY Her family history includes Depression in her mother; Diabetes in her father; Heart disease in her mother; Hypertension in her mother; Macular degeneration in her mother. SOCIAL HISTORY She  reports that she quit smoking  about 41 years ago. Her smoking use included cigarettes. She has a 7.50 pack-year smoking history. She has never used smokeless tobacco. She reports current alcohol use. She reports that she does not use drugs.   MEDICARE WELLNESS OBJECTIVES: Physical activity:   Cardiac risk factors:   Depression/mood screen:      04/08/2021   12:20 AM  Depression screen PHQ 2/9  Decreased Interest 0  Down, Depressed, Hopeless 0  PHQ - 2 Score 0    ADLs:     04/08/2021   12:20 AM  In your present state of health, do you have any difficulty performing the following activities:  Hearing? 0  Vision? 0  Difficulty concentrating or making decisions? 0  Walking or climbing stairs? 0  Dressing or bathing? 0  Doing errands, shopping? 0     Cognitive Testing  Alert? Yes  Normal Appearance?Yes  Oriented to person? Yes  Place? Yes   Time? Yes  Recall of three objects?  Yes  Can perform simple calculations? Yes  Displays appropriate judgment?Yes  Can read the correct time from a watch face?Yes  EOL planning:     Review of Systems  Constitutional: Negative.  Negative for chills, fever and weight loss.  HENT: Negative.  Negative for congestion and hearing loss.   Eyes: Negative.  Negative for blurred vision and double vision.  Respiratory: Negative.  Negative for cough and shortness of breath.   Cardiovascular: Negative.  Negative for chest pain, palpitations, orthopnea and leg swelling.  Gastrointestinal: Negative.  Negative for abdominal pain, constipation, diarrhea, heartburn, nausea and vomiting.  Genitourinary: Negative.   Musculoskeletal: Negative.  Negative for falls, joint pain and myalgias.  Skin: Negative.  Negative for rash.  Neurological:  Positive for tingling (back worse with stress). Negative for dizziness, tremors, sensory change, speech change, focal weakness, seizures, loss of consciousness and headaches.  Psychiatric/Behavioral:  Negative for depression, memory loss and suicidal  ideas.      Objective:   There were no vitals taken for this visit. There is no height or weight on file to calculate BMI.  General appearance: alert, no distress, WD/WN,  female HEENT: normocephalic, sclerae anicteric, pupils dilated at this time due to recent eye exam TMs pearly, nares patent, no discharge or erythema, pharynx normal Oral cavity: MMM, no lesions Neck: supple, no lymphadenopathy, no thyromegaly, no masses Heart: RRR, normal S1, S2, no murmurs Lungs: CTA bilaterally, no wheezes, rhonchi, or rales Abdomen: +bs, soft, non tender, non distended, no masses, no hepatomegaly, no splenomegaly Musculoskeletal: nontender, no swelling, no obvious deformity Extremities: no edema, no cyanosis, no clubbing Pulses: 2+ symmetric, upper and lower extremities, normal cap refill Neurological: alert, oriented x 3, CN2-12 intact, strength normal upper extremities and lower extremities, sensation normal throughout, DTRs 2+ throughout, no cerebellar signs, gait normal Psychiatric: normal affect, behavior normal, pleasant  Breast: Left breast with large 2-3 cm mobile non tender mass at 12 oclock.  Gyn: defer Rectal: defer  Medicare Attestation I have personally reviewed: The patient's medical and social history Their use of alcohol, tobacco or illicit drugs Their current medications and supplements The patient's functional ability including ADLs,fall risks, home safety risks, cognitive, and hearing and visual impairment Diet and physical activities Evidence for depression or mood disorders  The patient's weight, height, BMI, and visual acuity have been recorded in the chart.  I have made referrals, counseling, and provided education to the patient based on review of the above and I have provided the patient with a written personalized care plan for preventive services.    Alycia Rossetti, NP   11/22/2021

## 2021-11-23 ENCOUNTER — Encounter: Payer: Self-pay | Admitting: Nurse Practitioner

## 2021-11-23 ENCOUNTER — Ambulatory Visit (INDEPENDENT_AMBULATORY_CARE_PROVIDER_SITE_OTHER): Payer: Medicare Other | Admitting: Nurse Practitioner

## 2021-11-23 ENCOUNTER — Ambulatory Visit: Payer: Medicare Other | Admitting: Nurse Practitioner

## 2021-11-23 VITALS — BP 134/94 | HR 97 | Temp 97.7°F | Ht 62.5 in | Wt 196.0 lb

## 2021-11-23 DIAGNOSIS — F988 Other specified behavioral and emotional disorders with onset usually occurring in childhood and adolescence: Secondary | ICD-10-CM

## 2021-11-23 DIAGNOSIS — B009 Herpesviral infection, unspecified: Secondary | ICD-10-CM

## 2021-11-23 DIAGNOSIS — D0512 Intraductal carcinoma in situ of left breast: Secondary | ICD-10-CM

## 2021-11-23 DIAGNOSIS — M159 Polyosteoarthritis, unspecified: Secondary | ICD-10-CM

## 2021-11-23 DIAGNOSIS — M8580 Other specified disorders of bone density and structure, unspecified site: Secondary | ICD-10-CM

## 2021-11-23 DIAGNOSIS — Z0001 Encounter for general adult medical examination with abnormal findings: Secondary | ICD-10-CM

## 2021-11-23 DIAGNOSIS — F325 Major depressive disorder, single episode, in full remission: Secondary | ICD-10-CM

## 2021-11-23 DIAGNOSIS — D649 Anemia, unspecified: Secondary | ICD-10-CM

## 2021-11-23 DIAGNOSIS — M1711 Unilateral primary osteoarthritis, right knee: Secondary | ICD-10-CM

## 2021-11-23 DIAGNOSIS — E559 Vitamin D deficiency, unspecified: Secondary | ICD-10-CM

## 2021-11-23 DIAGNOSIS — K219 Gastro-esophageal reflux disease without esophagitis: Secondary | ICD-10-CM

## 2021-11-23 DIAGNOSIS — Z79899 Other long term (current) drug therapy: Secondary | ICD-10-CM

## 2021-11-23 DIAGNOSIS — D4860 Neoplasm of uncertain behavior of unspecified breast: Secondary | ICD-10-CM | POA: Diagnosis not present

## 2021-11-23 DIAGNOSIS — E785 Hyperlipidemia, unspecified: Secondary | ICD-10-CM | POA: Diagnosis not present

## 2021-11-23 DIAGNOSIS — E6609 Other obesity due to excess calories: Secondary | ICD-10-CM

## 2021-11-23 DIAGNOSIS — R6889 Other general symptoms and signs: Secondary | ICD-10-CM

## 2021-11-23 DIAGNOSIS — Z0000 Encounter for general adult medical examination without abnormal findings: Secondary | ICD-10-CM

## 2021-11-23 DIAGNOSIS — N1831 Chronic kidney disease, stage 3a: Secondary | ICD-10-CM

## 2021-11-23 DIAGNOSIS — L718 Other rosacea: Secondary | ICD-10-CM

## 2021-11-23 DIAGNOSIS — T7840XD Allergy, unspecified, subsequent encounter: Secondary | ICD-10-CM

## 2021-11-23 DIAGNOSIS — R0989 Other specified symptoms and signs involving the circulatory and respiratory systems: Secondary | ICD-10-CM | POA: Diagnosis not present

## 2021-11-23 DIAGNOSIS — R7309 Other abnormal glucose: Secondary | ICD-10-CM

## 2021-11-23 DIAGNOSIS — E782 Mixed hyperlipidemia: Secondary | ICD-10-CM

## 2021-11-23 DIAGNOSIS — Z87891 Personal history of nicotine dependence: Secondary | ICD-10-CM

## 2021-11-23 NOTE — Patient Instructions (Signed)

## 2021-11-23 NOTE — Progress Notes (Signed)
ANNUAL WELLNESS & FOLLOW UP  Assessment:   Encounter Medicare Wellness Due annually  Depression, major, in remission ([place]) Continue medications, stress management techniques discussed, increase water, good sleep hygiene discussed, increase exercise, and increase veggies.   Phyllodes tumor of breast S/p lumpectomy with radiation Oncology following  Ductal carcinoma in situ (DCIS) of left breast S/p lumpectomy with radiation Oncology following  Labile hypertension Discussed DASH (Dietary Approaches to Stop Hypertension) DASH diet is lower in sodium than a typical American diet. Cut back on foods that are high in saturated fat, cholesterol, and trans fats. Eat more whole-grain foods, fish, poultry, and nuts Remain active and exercise as tolerated daily.  Monitor BP at home-Call if greater than 130/80.  Check CMP/CBC   Hyperlipidemia, unspecified hyperlipidemia type Discussed lifestyle modifications. Recommended diet heavy in fruits and veggies, omega 3's. Decrease consumption of animal meats, cheeses, and dairy products. Remain active and exercise as tolerated. Continue to monitor. Check lipids/TSH    Vitamin D deficiency Continue supplement Monitor levels  Anemia, unspecified type Monitor, continue iron supp with Vitamin C and increase green leafy veggies  Osteopenia, unspecified location Pursue a combination of weight-bearing exercises and strength training. Advised on fall prevention measures including proper lighting in all rooms, removal of area rugs and floor clutter, use of walking devices as deemed appropriate, avoidance of uneven walking surfaces. Smoking cessation and moderate alcohol consumption if applicable Consume 295 to 1000 IU of vitamin D daily with a goal vitamin D serum value of 30 ng/mL or higher. Aim for 1000 to 1200 mg of elemental calcium daily through supplements and/or dietary sources.   Ocular rosacea Monitor  Class 1 obesity due to excess  calories with serious comorbidity and body mass index (BMI) of 32.0 to 32.9 in adult Discussed appropriate BMI Goal of losing 1 lb per month. Diet modification. Physical activity. Encouraged/praised to build confidence.  ADD Continue Adderall as directed. Discussed potential SE and habit seeking behavior to controlled substances.  Only take as directed. Continue to monitor   Gastroesophageal reflux disease, unspecified whether esophagitis present No suspected reflux complications ([place]/stricture). Lifestyle modification:  wt loss, avoid meals 2-3h before bedtime. Consider eliminating food triggers:  chocolate, caffeine, EtOH, acid/spicy food.   Allergy, subsequent encounter Allergic rhinitis Allegra OTC, increase H20, allergy hygiene explained. Avoid triggers  HSV infection Monitor  Primary osteoarthritis involving multiple joints/DJD Arthritis of right knee Continue follow up ortho   Medication management All medications discussed and reviewed in full. All questions and concerns regarding medications addressed.     Orders Placed This Encounter  Procedures   COMPLETE METABOLIC PANEL WITH GFR   Lipid panel   VITAMIN D 25 Hydroxy (Vit-D Deficiency, Fractures)   CBC with Differential/Platelet   Further disposition pending results if labs check today. Discussed med's effects and SE's.    Over 30 minutes of face to face interview, exam, counseling, chart review, and critical decision making was performed.    Future Appointments  Date Time Provider [place]  04/14/2022  3:00 PM Unk Pinto, MD [facility] None     Subjective:   Kathy Cook is a 76 y.o. female who presents for complete physical and follow up on HTN, HLD, vitamin D def and weight.   She reports overall she is doing well.    She has been obtaining Wegovy in a compound formula from a pharmacy, prescribed by her GYN.  She is discouraged that she has not been losing any  weight.  BMI is  Body mass index is 35.28 kg/m., she is working on diet and exercise. Wt Readings from Last 3 Encounters:  11/23/21 196 lb (88.9 kg)  11/18/21 198 lb (89.8 kg)  07/23/21 196 lb 4.8 oz (89 kg)   She is S/p lumpectomy with radiation on her left breast, no longer on hormones due to DC in situ, She has weaned off of effexor 37.'5mg'$  by Dr. Griffith Citron. Last follow up 04/30/20.   She does well on the Adderall, helps with concentration however has not been taking recently.   Has HSV 1 and has famcyclovir for occ out break. Has not had to use recently, no refill needed.  Her blood pressure has been controlled at home, today their BP is BP: (!) 134/94 She does workout, walks. She denies chest pain, shortness of breath, dizziness.  She is not on cholesterol medication, can not tolerate statins and could not tolerate zetia due to diarrhea. No family history.  Her cholesterol is not at goal. The cholesterol last visit was:   Lab Results  Component Value Date   CHOL 242 (H) 04/08/2021   HDL 74 04/08/2021   LDLCALC 115 (H) 04/08/2021   TRIG 368 (H) 04/08/2021   CHOLHDL 3.3 04/08/2021   Last A1C in the office was:  Lab Results  Component Value Date   HGBA1C 5.4 04/08/2021   Patient is on Vitamin D supplement. Lab Results  Component Value Date   VD25OH 54 04/08/2021    Names of Other Physician/Practitioners you currently use: 1. [facility]- here for primary care 2. [facility], eye doctor, 04/2021 has new corrective lenses 3.  Dr. Johnnye Sima, dentist, last visit q 6 months  Patient Care Team: Unk Pinto, MD as PCP - General (Internal Medicine) Dian Queen, MD as Consulting Physician (Obstetrics and Gynecology) Clent Jacks, MD as Consulting Physician (Ophthalmology) Lindwood Coke, MD as Consulting Physician (Dermatology) Magrinat, Virgie Dad, MD (Inactive) as Consulting Physician (Oncology) Coralie Keens, MD as Consulting Physician  (General Surgery) Eppie Gibson, MD as Attending Physician (Radiation Oncology) [name], MD as Consulting Physician (Orthopedic Surgery) Mauro Kaufmann, RN as Oncology Nurse Navigator Rockwell Germany, RN as Oncology Nurse Navigator  Medication Review     Current Outpatient Medications (Analgesics):    celecoxib (CELEBREX) 200 MG capsule, Take 200 mg by mouth 2 (two) times daily.   Current Outpatient Medications (Other):    amphetamine-dextroamphetamine (ADDERALL) 30 MG tablet, Take 1/2 to 1 tablet 2 x /day as needed for Focus & Concentration TEVA BRAND ONLY   Cholecalciferol (DIALYVITE VITAMIN D 5000) 125 MCG (5000 UT) capsule, Take 10,000 Units by mouth daily.   famciclovir (FAMVIR) 500 MG tablet, Take 1 tablet (500 mg total) by mouth 2 (two) times daily as needed (fever blisters).  Current Problems (verified) Patient Active Problem List   Diagnosis Date Noted   Acute anal fissure 05/17/2020   Phyllodes tumor of breast 08/07/2019   Ductal carcinoma in situ (DCIS) of left breast 08/06/2019   Status post total hip replacement, right 03/05/2019   Arthritis of right knee 03/04/2019   Unilateral primary osteoarthritis, right knee 02/05/2019   Knee locking, right 01/09/2019   Metatarsal stress fracture, right, initial encounter 10/03/2016   DJD (degenerative joint disease) 06/09/2016   Labile hypertension 08/01/2014   Vitamin D deficiency 08/01/2014   Medication management 08/01/2014   Obesity 07/04/2014   Hyperlipemia 06/27/2013   Attention deficit disorder (ADD)    GERD (gastroesophageal reflux disease)    Allergy  Depression, major, in remission ([place])    Anemia    Ocular rosacea    HSV infection    Osteopenia     Screening Tests Immunization History  Administered Date(s) Administered   DT (Pediatric) 01/14/2015   Influenza, High Dose Seasonal PF 04/30/2018, 11/13/2018, 01/06/2020   PFIZER(Purple Top)SARS-COV-2 Vaccination 04/07/2019, 05/01/2019    Pneumococcal Conjugate-13 03/24/2015   Pneumococcal-Unspecified 12/31/2012   Td 03/29/2004    Preventative care: Last colonoscopy: 06/2017 last colonoscopy per Dr. Collene Mares Last mammogram: 05/2021 Yearly Last pap smear/pelvic exam: 2020 at GYN Dr. Tressia Danas DEXA 2019 getting this [id number] at GYN  Prior vaccinations: TD or Tdap: 2016  Influenza: Due Pneumococcal: 2014 Prevnar 13: 2017 Due Shingles/Zostavax: DUE  Allergies Allergies  Allergen Reactions   Zetia [Ezetimibe] Diarrhea    Patient states she had IBS, that took a year to resolve.   Prednisone Nausea And Vomiting   Statins Other (See Comments)    paralyzing   Strattera [Atomoxetine Hcl]     Increased sadness    SURGICAL HISTORY She  has a past surgical history that includes Tonsillectomy and adenoidectomy; Skin biopsy; Total knee arthroplasty (Right, 03/04/2019); Breast lumpectomy (Left, 07/18/2019); Colonoscopy (2019); Upper gi endoscopy; Re-excision of breast lumpectomy (Left, 09/05/2019); and Breast biopsy (Left, 05/28/2019). FAMILY HISTORY Her family history includes Depression in her mother; Diabetes in her father; Heart disease in her mother; Hypertension in her mother; Macular degeneration in her mother. SOCIAL HISTORY She  reports that she quit smoking about 41 years ago. Her smoking use included cigarettes. She has a 7.50 pack-year smoking history. She has never used smokeless tobacco. She reports current alcohol use. She reports that she does not use drugs.  Review of Systems  Constitutional: Negative.  Negative for chills, diaphoresis, fever, malaise/fatigue and weight loss.  HENT: Negative.  Negative for congestion, ear discharge, ear pain, hearing loss, nosebleeds, sinus pain, sore throat and tinnitus.   Eyes: Negative.  Negative for blurred vision, double vision, photophobia, pain, discharge and redness.  Respiratory: Negative.  Negative for cough, hemoptysis, sputum production, shortness of breath, wheezing  and stridor.   Cardiovascular: Negative.  Negative for chest pain, palpitations, orthopnea, claudication, leg swelling and PND.  Gastrointestinal: Negative.  Negative for abdominal pain, blood in stool, constipation, diarrhea, heartburn, melena, nausea and vomiting.  Genitourinary: Negative.  Negative for dysuria, flank pain, frequency, hematuria and urgency.  Musculoskeletal: Negative.  Negative for back pain, falls, joint pain, myalgias and neck pain.  Skin: Negative.  Negative for itching and rash.  Neurological:  Negative for dizziness, tingling, tremors, sensory change, speech change, focal weakness, seizures, loss of consciousness, weakness and headaches.  Endo/Heme/Allergies:  Negative for environmental allergies and polydipsia. Does not bruise/bleed easily.  Psychiatric/Behavioral:  Negative for depression, hallucinations, memory loss, substance abuse and suicidal ideas. The patient is not nervous/anxious and does not have insomnia.      Objective:   Blood pressure (!) 134/94, pulse 97, temperature 97.7 F (36.5 C), height 5' 2.5" (1.588 m), weight 196 lb (88.9 kg), SpO2 98 %. Body mass index is 35.28 kg/m.  General appearance: alert, no distress, WD/WN,  female HEENT: normocephalic, sclerae anicteric, pupils dilated at this time due to recent eye exam TMs pearly, nares patent, no discharge or erythema, pharynx normal Oral cavity: MMM, no lesions Neck: supple, no lymphadenopathy, no thyromegaly, no masses Heart: RRR, normal S1, S2, no murmurs Lungs: CTA bilaterally, no wheezes, rhonchi, or rales Abdomen: +bs, soft, non tender, non distended, no masses, no hepatomegaly, no  splenomegaly Musculoskeletal: nontender, no swelling, no obvious deformity Extremities: no edema, no cyanosis, no clubbing Pulses: 2+ symmetric, upper and lower extremities, normal cap refill Neurological: alert, oriented x 3, CN2-12 intact, strength normal upper extremities and lower extremities, sensation  normal throughout, DTRs 2+ throughout, no cerebellar signs, gait normal Psychiatric: normal affect, behavior normal, pleasant  Breast: Left breast with large 2-3 cm mobile non tender mass at 12 oclock.  Gyn: defer Rectal: defer   Darrol Jump, NP [facility] 11/23/2021  9:36 PM

## 2021-11-24 LAB — COMPLETE METABOLIC PANEL WITH GFR
AG Ratio: 1.9 (calc) (ref 1.0–2.5)
ALT: 13 U/L (ref 6–29)
AST: 15 U/L (ref 10–35)
Albumin: 4.6 g/dL (ref 3.6–5.1)
Alkaline phosphatase (APISO): 60 U/L (ref 37–153)
BUN/Creatinine Ratio: 26 (calc) — ABNORMAL HIGH (ref 6–22)
BUN: 27 mg/dL — ABNORMAL HIGH (ref 7–25)
CO2: 27 mmol/L (ref 20–32)
Calcium: 10.1 mg/dL (ref 8.6–10.4)
Chloride: 103 mmol/L (ref 98–110)
Creat: 1.03 mg/dL — ABNORMAL HIGH (ref 0.60–1.00)
Globulin: 2.4 g/dL (calc) (ref 1.9–3.7)
Glucose, Bld: 104 mg/dL — ABNORMAL HIGH (ref 65–99)
Potassium: 4.6 mmol/L (ref 3.5–5.3)
Sodium: 140 mmol/L (ref 135–146)
Total Bilirubin: 0.4 mg/dL (ref 0.2–1.2)
Total Protein: 7 g/dL (ref 6.1–8.1)
eGFR: 57 mL/min/{1.73_m2} — ABNORMAL LOW (ref >=60)

## 2021-11-24 LAB — LIPID PANEL
Cholesterol: 326 mg/dL — ABNORMAL HIGH (ref <200)
HDL: 86 mg/dL (ref >=50)
LDL Cholesterol (Calc): 198 mg/dL (calc) — ABNORMAL HIGH
Non-HDL Cholesterol (Calc): 240 mg/dL (calc) — ABNORMAL HIGH (ref <130)
Total CHOL/HDL Ratio: 3.8 (calc) (ref <5.0)
Triglycerides: 220 mg/dL — ABNORMAL HIGH (ref <150)

## 2021-11-24 LAB — CBC WITH DIFFERENTIAL/PLATELET
Absolute Monocytes: 676 cells/uL (ref 200–950)
Basophils Absolute: 38 cells/uL (ref 0–200)
Basophils Relative: 0.5 %
Eosinophils Absolute: 91 cells/uL (ref 15–500)
Eosinophils Relative: 1.2 %
HCT: 40.7 % (ref 35.0–45.0)
Hemoglobin: 13.8 g/dL (ref 11.7–15.5)
Lymphs Abs: 1338 cells/uL (ref 850–3900)
MCH: 32.2 pg (ref 27.0–33.0)
MCHC: 33.9 g/dL (ref 32.0–36.0)
MCV: 94.9 fL (ref 80.0–100.0)
MPV: 10.3 fL (ref 7.5–12.5)
Monocytes Relative: 8.9 %
Neutro Abs: 5457 cells/uL (ref 1500–7800)
Neutrophils Relative %: 71.8 %
Platelets: 222 10*3/uL (ref 140–400)
RBC: 4.29 10*6/uL (ref 3.80–5.10)
RDW: 11.8 % (ref 11.0–15.0)
Total Lymphocyte: 17.6 %
WBC: 7.6 10*3/uL (ref 3.8–10.8)

## 2021-11-24 LAB — VITAMIN D 25 HYDROXY (VIT D DEFICIENCY, FRACTURES): Vit D, 25-Hydroxy: 34 ng/mL (ref 30–100)

## 2021-12-23 ENCOUNTER — Ambulatory Visit: Payer: Medicare Other | Admitting: Hematology and Oncology

## 2022-04-13 DIAGNOSIS — H353131 Nonexudative age-related macular degeneration, bilateral, early dry stage: Secondary | ICD-10-CM | POA: Diagnosis not present

## 2022-04-13 DIAGNOSIS — H2513 Age-related nuclear cataract, bilateral: Secondary | ICD-10-CM | POA: Diagnosis not present

## 2022-04-13 DIAGNOSIS — H5051 Esophoria: Secondary | ICD-10-CM | POA: Diagnosis not present

## 2022-04-13 DIAGNOSIS — H532 Diplopia: Secondary | ICD-10-CM | POA: Diagnosis not present

## 2022-04-13 DIAGNOSIS — H43812 Vitreous degeneration, left eye: Secondary | ICD-10-CM | POA: Diagnosis not present

## 2022-04-13 DIAGNOSIS — H04123 Dry eye syndrome of bilateral lacrimal glands: Secondary | ICD-10-CM | POA: Diagnosis not present

## 2022-04-14 ENCOUNTER — Encounter: Payer: Medicare Other | Admitting: Internal Medicine

## 2022-05-16 ENCOUNTER — Other Ambulatory Visit: Payer: Self-pay | Admitting: Internal Medicine

## 2022-05-16 DIAGNOSIS — R928 Other abnormal and inconclusive findings on diagnostic imaging of breast: Secondary | ICD-10-CM

## 2022-05-16 DIAGNOSIS — Z853 Personal history of malignant neoplasm of breast: Secondary | ICD-10-CM

## 2022-05-17 ENCOUNTER — Other Ambulatory Visit (INDEPENDENT_AMBULATORY_CARE_PROVIDER_SITE_OTHER): Payer: Medicare Other

## 2022-05-17 ENCOUNTER — Encounter: Payer: Self-pay | Admitting: Orthopaedic Surgery

## 2022-05-17 ENCOUNTER — Ambulatory Visit: Payer: Medicare Other | Admitting: Orthopaedic Surgery

## 2022-05-17 VITALS — BP 145/85 | Ht 62.0 in | Wt 195.0 lb

## 2022-05-17 DIAGNOSIS — M25561 Pain in right knee: Secondary | ICD-10-CM

## 2022-05-17 DIAGNOSIS — G8929 Other chronic pain: Secondary | ICD-10-CM

## 2022-05-17 DIAGNOSIS — M6281 Muscle weakness (generalized): Secondary | ICD-10-CM

## 2022-05-17 NOTE — Addendum Note (Signed)
Addended by: Meyer Cory on: 05/17/2022 09:53 AM   Modules accepted: Orders

## 2022-05-17 NOTE — Progress Notes (Signed)
Office Visit Note   Patient: Kathy Cook           Date of Birth: 10-31-1945           MRN: [id number] Visit Date: 05/17/2022              Requested by: Unk Pinto, MD [address] [place],  [place] [address] PCP: Unk Pinto, MD   Assessment & Plan: Visit Diagnoses:  1. Chronic pain of right knee   2. Quadriceps weakness     Plan: Will set up with some physical therapy when she is able to do the appropriate exercises she can transition and continue working out on her own.  She has been doing some workout in the gym but not enough to strengthen the quad.  Follow-Up Instructions: Return if symptoms worsen or fail to improve.   Orders:  Orders Placed This Encounter  Procedures   XR KNEE 3 VIEW RIGHT   No orders of the defined types were placed in this encounter.     Procedures: No procedures performed   Clinical Data: No additional findings.   Subjective: Chief Complaint  Patient presents with   Right Knee - Pain    HPI patient turns having problems with her right leg she is getting ready to go to [place] in a few months has been going to the gym and working out but still is having problems with stairs notices popping leg is sore sometimes she is walking with forward flexion.  Right total knee arthroplasty date was 03/04/2019.  X-rays today show good position and alignment without subsidence or loosening.  Her postop rehab was interrupted by pulmonary problems with probable COVID.  She did work with Aaron Edelman in physical therapy 2 years ago.  Review of Systems All systems noncontributory.  Objective: Vital Signs: BP (!) 145/85   Ht 5\' 2"  (1.575 m)   Wt 195 lb (88.5 kg)   LMP  (LMP Unknown)   BMI 35.67 kg/m   Physical Exam Constitutional:      Appearance: [name].  HENT:     Head: Normocephalic.     Right Ear: External ear normal.     Left Ear: External ear normal. There is no impacted cerumen.  Eyes:     Pupils:  Pupils are equal, round, and reactive to light.  Neck:     Thyroid: No thyromegaly.     Trachea: No tracheal deviation.  Cardiovascular:     Rate and Rhythm: Normal rate.  Pulmonary:     Effort: Pulmonary effort is normal.  Abdominal:     Palpations: Abdomen is soft.  Musculoskeletal:     Cervical back: No rigidity.  Skin:    General: Skin is warm and dry.  Neurological:     Mental Status: [name], place, and time.  Psychiatric:        Behavior: Behavior normal.     Ortho Exam patient is near full extension might like 2 or 3 degrees flexion past 115 degrees.  With a step she has hop step going up on the operative right knee.  Normal sequence with the left.  On the right side she hops up with knee and hip flexion and then extends her knee and hip to get upright.  Pulses are normal.  Specialty Comments:  No specialty comments available.  Imaging: XR KNEE 3 VIEW RIGHT  Result Date: 05/17/2022 Standing AP both knees lateral right knee and  sunrise patella x-ray demonstrates satisfactory right total knee arthroplasty without loosening or subsidence.  Opposite left knee shows mild arthritic changes. Impression: Satisfactory right total knee arthroplasty unchanged from January 2021 images.    PMFS History: Patient Active Problem List   Diagnosis Date Noted   Quadriceps weakness 05/17/2022   Acute anal fissure 05/17/2020   Phyllodes tumor of breast 08/07/2019   Ductal carcinoma in situ (DCIS) of left breast 08/06/2019   Status post total hip replacement, right 03/05/2019   Arthritis of right knee 03/04/2019   Unilateral primary osteoarthritis, right knee 02/05/2019   Knee locking, right 01/09/2019   Metatarsal stress fracture, right, initial encounter 10/03/2016   DJD (degenerative joint disease) 06/09/2016   Labile hypertension 08/01/2014   Vitamin D deficiency 08/01/2014   Medication management 08/01/2014   Obesity 07/04/2014   Hyperlipemia  06/27/2013   Attention deficit disorder (ADD)    GERD (gastroesophageal reflux disease)    Allergy    Depression, major, in remission ([place])    Anemia    Ocular rosacea    HSV infection    Osteopenia    Past Medical History:  Diagnosis Date   Allergy    Anxiety    Attention deficit disorder (ADD)    Cancer ([place])    ? basal/squam cell on chest, and forehead   Depression    Dry eyes    GERD (gastroesophageal reflux disease)    diet controlled, no meds   HLD (hyperlipidemia)    HSV infection    Lumbar degenerative disc disease    Ocular rosacea    Osteopenia    Personal history of radiation therapy     Family History  Problem Relation Age of Onset   Diabetes Father    Hypertension Mother    Depression Mother    Macular degeneration Mother    Heart disease Mother     Past Surgical History:  Procedure Laterality Date   BREAST BIOPSY Left 05/28/2019   BREAST LUMPECTOMY Left 07/18/2019   Procedure: LEFT BREAST LUMPECTOMY;  Surgeon: Coralie Keens, MD;  Location: [facility];  Service: General;  Laterality: Left;  LMA   COLONOSCOPY  2019   RE-EXCISION OF BREAST LUMPECTOMY Left 09/05/2019   Procedure: RE-EXCISION OF LEFT BREAST TUMOR;  Surgeon: Coralie Keens, MD;  Location: [facility];  Service: General;  Laterality: Left;  LMA   SKIN BIOPSY     ?basal/squam cell on chest   TONSILLECTOMY AND ADENOIDECTOMY     TOTAL KNEE ARTHROPLASTY Right 03/04/2019   Procedure: RIGHT TOTAL KNEE ARTHROPLASTY;  Surgeon: [name], MD;  Location: [facility];  Service: Orthopedics;  Laterality: Right;   UPPER GI ENDOSCOPY     Social History   Occupational History   Not on file  Tobacco Use   Smoking status: Former    Packs/day: 0.50    Years: 15.00    Additional pack years: 0.00    Total pack years: 7.50    Types: Cigarettes    Quit date: 03/23/1980    Years since quitting: 42.1   Smokeless tobacco: Never  Vaping Use   Vaping Use: Never used  Substance and Sexual  Activity   Alcohol use: Yes    Alcohol/week: 0.0 standard drinks of alcohol    Comment: social   Drug use: No   Sexual activity: Yes    Birth control/protection: Post-menopausal

## 2022-05-24 ENCOUNTER — Ambulatory Visit: Payer: Medicare Other | Admitting: Physical Therapy

## 2022-05-24 ENCOUNTER — Ambulatory Visit: Payer: Medicare Other | Admitting: Rehabilitative and Restorative Service Providers"

## 2022-05-25 ENCOUNTER — Other Ambulatory Visit: Payer: Self-pay

## 2022-05-25 ENCOUNTER — Encounter: Payer: Self-pay | Admitting: Physical Therapy

## 2022-05-25 ENCOUNTER — Ambulatory Visit: Payer: Medicare Other | Admitting: Physical Therapy

## 2022-05-25 DIAGNOSIS — R600 Localized edema: Secondary | ICD-10-CM | POA: Diagnosis not present

## 2022-05-25 DIAGNOSIS — M25561 Pain in right knee: Secondary | ICD-10-CM

## 2022-05-25 DIAGNOSIS — M6281 Muscle weakness (generalized): Secondary | ICD-10-CM

## 2022-05-25 DIAGNOSIS — R262 Difficulty in walking, not elsewhere classified: Secondary | ICD-10-CM | POA: Diagnosis not present

## 2022-05-25 DIAGNOSIS — G8929 Other chronic pain: Secondary | ICD-10-CM | POA: Diagnosis not present

## 2022-05-25 NOTE — Therapy (Signed)
OUTPATIENT PHYSICAL THERAPY LOWER EXTREMITY EVALUATION   Patient Name: Kathy Cook MRN: [id number] DOB:1945-09-12, 77 y.o., female Today's Date: 05/25/2022  END OF SESSION:  PT End of Session - 05/25/22 1404     Visit Number 1    Number of Visits 15    Date for PT Re-Evaluation 07/20/22    Authorization Type [employer] MCR    Progress Note Due on Visit 10    PT Start Time 1301    PT Stop Time 1350    PT Time Calculation (min) 49 min    Activity Tolerance Patient tolerated treatment well             Past Medical History:  Diagnosis Date   Allergy    Anxiety    Attention deficit disorder (ADD)    Cancer ([place])    ? basal/squam cell on chest, and forehead   Depression    Dry eyes    GERD (gastroesophageal reflux disease)    diet controlled, no meds   HLD (hyperlipidemia)    HSV infection    Lumbar degenerative disc disease    Ocular rosacea    Osteopenia    Personal history of radiation therapy    Past Surgical History:  Procedure Laterality Date   BREAST BIOPSY Left 05/28/2019   BREAST LUMPECTOMY Left 07/18/2019   Procedure: LEFT BREAST LUMPECTOMY;  Surgeon: Coralie Keens, MD;  Location: [facility];  Service: General;  Laterality: Left;  LMA   COLONOSCOPY  2019   RE-EXCISION OF BREAST LUMPECTOMY Left 09/05/2019   Procedure: RE-EXCISION OF LEFT BREAST TUMOR;  Surgeon: Coralie Keens, MD;  Location: [facility];  Service: General;  Laterality: Left;  LMA   SKIN BIOPSY     ?basal/squam cell on chest   TONSILLECTOMY AND ADENOIDECTOMY     TOTAL KNEE ARTHROPLASTY Right 03/04/2019   Procedure: RIGHT TOTAL KNEE ARTHROPLASTY;  Surgeon: [name], MD;  Location: [facility];  Service: Orthopedics;  Laterality: Right;   UPPER GI ENDOSCOPY     Patient Active Problem List   Diagnosis Date Noted   Quadriceps weakness 05/17/2022   Acute anal fissure 05/17/2020   Phyllodes tumor of breast 08/07/2019   Ductal carcinoma in situ (DCIS) of left breast 08/06/2019    Status post total hip replacement, right 03/05/2019   Arthritis of right knee 03/04/2019   Unilateral primary osteoarthritis, right knee 02/05/2019   Knee locking, right 01/09/2019   Metatarsal stress fracture, right, initial encounter 10/03/2016   DJD (degenerative joint disease) 06/09/2016   Labile hypertension 08/01/2014   Vitamin D deficiency 08/01/2014   Medication management 08/01/2014   Obesity 07/04/2014   Hyperlipemia 06/27/2013   Attention deficit disorder (ADD)    GERD (gastroesophageal reflux disease)    Allergy    Depression, major, in remission ([place])    Anemia    Ocular rosacea    HSV infection    Osteopenia     PCP: Unk Pinto, MD   REFERRING PROVIDER: [name], MD,  REFERRING DIAG:  [phone] (ICD-10-CM) - Chronic pain of right knee  M62.81 (ICD-10-CM) - Quadriceps weakness    THERAPY DIAG:  Chronic pain of right knee  Muscle weakness (generalized)  Difficulty in walking, not elsewhere classified  Localized edema  Rationale for Evaluation and Treatment: Rehabilitation  ONSET DATE: Rt TKA 03/04/19 with residual quad weakness  SUBJECTIVE:   SUBJECTIVE STATEMENT: She says her Rt leg still feels week still, she had PT after TKA but then had  to undergo breast cancer treatment and felt she has regressed. Her Rt knee still gets swollen and tight.   PERTINENT HISTORY:PMH: Rt TKA 03/04/19, Ca,lumbar DDD, osteopenia,  PAIN:  Are you having pain? Yes: NPRS scale: 05/01/08 Pain location: anterior Rt knee Pain description: irritating and sore Aggravating factors: walking, stairs Relieving factors: rest  PRECAUTIONS: None  WEIGHT BEARING RESTRICTIONS: No  FALLS:  Has patient fallen in last 6 months? No  OCCUPATION:   PLOF: Independent  PATIENT GOALS: improve strength and walk longer as she has trip coming up to [place] end of May.   NEXT MD VISIT: nothing scheduled  OBJECTIVE:   DIAGNOSTIC FINDINGS: Result Date:  05/17/2022 Standing AP both knees lateral right knee and sunrise patella x-ray demonstrates satisfactory right total knee arthroplasty without loosening or subsidence.  Opposite left knee shows mild arthritic changes. Impression: Satisfactory right total knee arthroplasty unchanged from January 2021 images.   PATIENT SURVEYS:  FOTO wasn't set up for her so will omit  COGNITION: Overall cognitive status: Within functional limits for tasks assessed     SENSATION: She reports light sensation still diminished some around Rt knee  EDEMA:  Eval: mild edema in Rt knee  MUSCLE LENGTH: Tight quads on Rt  POSTURE:   PALPATION:   LOWER EXTREMITY ROM:  Active ROM/PROM Right eval Left eval  Hip flexion    Hip extension    Hip abduction    Hip adduction    Hip internal rotation    Hip external rotation    Knee flexion 115/120c   Knee extension 0   Ankle dorsiflexion    Ankle plantarflexion    Ankle inversion    Ankle eversion     (Blank rows = not tested)  LOWER EXTREMITY MMT:  MMT Right eval Left eval  Hip flexion 4 4+  Hip extension    Hip abduction 4 4+  Hip adduction    Hip internal rotation    Hip external rotation    Knee flexion 5 5  Knee extension 4 43# 4 39#  Ankle dorsiflexion    Ankle plantarflexion    Ankle inversion    Ankle eversion     (Blank rows = not tested)  LOWER EXTREMITY SPECIAL TESTS:    FUNCTIONAL TESTS:  Eval: 5 times sit to stand: 8.9 seconds without UE support SLS avg of 6 seconds bilat  GAIT:Eval Comments: WFL gait, independent community Ambulator but does have pain with prolonged walking  TODAY'S TREATMENT:  Eval HEP creation and review with demonstration and trial set preformed, see below for details Nu step LE only L5 X 6 min Partial lunges with UE support X 10 bilat, slow eccentrics Standing quad stretch with strap and UE support 3 X 30 sec on Rt Reviewed her gym program and home work out program    PATIENT  EDUCATION: Education details: HEP, PT plan of care Person educated: Patient Education method: Explanation, Demonstration, Verbal cues, and Handouts Education comprehension: verbalized understanding and needs further education   HOME EXERCISE PROGRAM: Access Code: [email or web address] URL: [email or web address] Date: 05/25/2022 Prepared by: Elsie Ra  Exercises - Standing Quad Stretch with Strap  - 1 x daily - 6 x weekly - 1 sets - 3 reps - 30 sec hold - Standing Partial Lunge  - 1 x daily - 6 x weekly - 2 sets - 10 reps - Step Up  - 1 x daily - 6 x weekly - 1 sets - 10 reps - Sit to Stand Without  Arm Support  - 1 x daily - 6 x weekly - 1-2 sets - 10 reps - Full Leg Press  - 1 x daily - 6 x weekly - 2-3 sets - 10-20 reps - Single Leg Press  - 1 x daily - 6 x weekly - 2-3 sets - 10-20 reps - Single Leg Knee Extension with Weight Machine  - 1 x daily - 6 x weekly - 2-3 sets - 10-20 reps  ASSESSMENT:  CLINICAL IMPRESSION: Patient referred to PT for Rt TKA 03/04/19 with residual quad weakness and difficulty with prolonged standing or walking. Patient will benefit from skilled PT to address below impairments, limitations and improve overall function.  OBJECTIVE IMPAIRMENTS: decreased activity tolerance, difficulty walking, decreased balance, decreased endurance, decreased mobility, decreased ROM, decreased strength, impaired flexibility, impaired LE use, postural dysfunction, and pain.  ACTIVITY LIMITATIONS: bending, lifting, carry, locomotion, cleaning, community activity, driving, and or occupation  PERSONAL FACTORS: PMH: Rt TKA 03/04/19, Ca,lumbar DDD, osteopenia, are also affecting patient's functional outcome.  REHAB POTENTIAL: Good  CLINICAL DECISION MAKING: Stable/uncomplicated  EVALUATION COMPLEXITY: Low    GOALS: Short term PT Goals Target date: 06/22/2022   Pt will be I and compliant with HEP. Baseline:  Goal status: New Pt will decrease pain by 25%  overall Baseline: Goal status: New  Long term PT goals Target date:07/20/2022 Pt will improve  hip/knee strength to at least 5-/5 MMT to improve functional strength Baseline: Goal status: New Pt will reduce pain to overall less than 2-3/10 with usual activity, walking, standing Baseline: Goal status: New Pt will be able to ambulate community distances at least 1000 ft WNL gait pattern without complaints and navigate one flight of stairs reciprocal without complaints Baseline: Goal status: New  PLAN: PT FREQUENCY: 1-3 times per week   PT DURATION: 8 weeks  PLANNED INTERVENTIONS (unless contraindicated): aquatic PT, Canalith repositioning, cryotherapy, Electrical stimulation, Iontophoresis with 4 mg/ml dexamethasome, Moist heat, traction, Ultrasound, gait training, Therapeutic exercise, balance training, neuromuscular re-education, patient/family education, prosthetic training, manual techniques, passive ROM, dry needling, taping, vasopnuematic device, vestibular, spinal manipulations, joint manipulations  PLAN FOR NEXT SESSION: Rt quad and glute strength focus, go over machines she can go at gym and add hip strength into HEP.

## 2022-05-30 ENCOUNTER — Encounter: Payer: Self-pay | Admitting: Internal Medicine

## 2022-05-30 NOTE — Progress Notes (Incomplete)
Annual Screening/Preventative Visit & Comprehensive Evaluation &  Examination   Future Appointments  Date Time Provider Department  05/31/2022  3:00 PM Unk Pinto, MD [facility]  06/29/2022 11:40 AM [facility]         This very nice 77 y.o.  MWF with  HTN, HLD, Prediabetes  and Vitamin D Deficiency  presents for a Screening /Preventative Visit & comprehensive evaluation and management of multiple medical co-morbidities.  Patient has hx/o Depression in remission on Effexor. Chest CT scan in May 2023 showed Aortic Atherosclerosis & a 7 x 6 mm LLL nodule and Dr Debara Pickett scheduled a f/u Chest CT          Patienty has been followed expectantly for labile elevated BP.  Patient's BP has been controlled  and patient denies any cardiac symptoms as chest pain, palpitations, shortness of breath, dizziness or ankle swelling. Today's BP  is at goal -                           .         Patient's hyperlipidemia has hx/o statin intolerance  &  allergy to Ezetimibe  &  is not controlled with diet .  Patient denies myalgias or other medication SE's. Last lipids were not at goal :  Lab Results  Component Value Date   CHOL 326 (H) 11/23/2021   HDL 86 11/23/2021   LDLCALC 198 (H) 11/23/2021   TRIG 220 (H) 11/23/2021   CHOLHDL 3.8 11/23/2021           Patient has hx/o moderate obesity  (BMI 34+)  and is monitored expectantly for glucose intolerance  and patient denies reactive hypoglycemic symptoms, visual blurring, diabetic polys or paresthesias. Last A1c was normal & at goal :  Lab Results  Component Value Date   HGBA1C 5.5 11/19/2020         Finally, patient has history of Vitamin D Deficiency ("40" /2010) and last Vitamin D was not  at goal (70-100) :  Lab Results  Component Value Date   VD25OH 34 11/23/2021       Current Outpatient Medications  Medication Instructions  . ADDERALL 30 MG tablet Take 1/2 to 1 tablet 2 x /day as needed for Focus & Concentration  [employer] BRAND ONLY  . CELEBREX  00 mg   Oral, 2 times daily  . Vitamin D 10,000 Units    Daily  . famciclovir (FAMVIR)  500 mg    2 times daily PRN     Allergies  Allergen Reactions  . Zetia [Ezetimibe] Diarrhea    Patient states she had IBS, that took a year to resolve.  . Prednisone Nausea And Vomiting  . Statins     paralyzing  . Strattera [Atomoxetine Hcl]     Increased sadness     Past Medical History:  Diagnosis Date  . Allergy   . Anxiety   . Attention deficit disorder (ADD)   . Cancer [facility])    ? basal/squam cell on chest, and forehead  . Depression   . Dry eyes   . GERD (gastroesophageal reflux disease)    diet controlled, no meds  . HLD (hyperlipidemia)   . HSV infection   . Lumbar degenerative disc disease   . Ocular rosacea   . Osteopenia      Health Maintenance  Topic Date Due  . Zoster Vaccines- Shingrix (1 of 2) Never done  .  Pneumonia Vaccine 38+ Years old (2 - PPSV23 if available, else PCV20) 03/23/2016  . COVID-19 Vaccine (3 - Pfizer risk series) 05/29/2019  . INFLUENZA VACCINE  09/28/2020  . MAMMOGRAM  06/13/2022  . TETANUS/TDAP  01/14/2025  . COLONOSCOPY  07/13/2027  . DEXA SCAN  Completed  . Hepatitis C Screening  Completed  . HPV VACCINES  Aged Out    Immunization History  Administered Date(s) Administered  . DT  01/14/2015  . Influenza, High Dose  04/30/2018, 11/13/2018, 01/06/2020  . PFIZER SARS-COV-2 Vacc 04/07/2019, 05/01/2019  . Pneumococcal -13 03/24/2015  . Pneumococcal-23 12/31/2012  . Td 03/29/2004    Last Colon - 07/12/2017 -  Dr Lester Pomona - Recc 10 year f/u  due May 2029  (age 4 yo)    Last MGM - 06/14/2021 - scheduled 06/29/2022   Past Surgical History:  Procedure Laterality Date  . BREAST LUMPECTOMY Left 07/18/2019   Procedure: LEFT BREAST LUMPECTOMY;  Surgeon: Coralie Keens, MD;  Location: [facility];  Service: General;  Laterality: Left;  LMA  . COLONOSCOPY  2019  . RE-EXCISION OF BREAST  LUMPECTOMY Left 09/05/2019   Procedure: RE-EXCISION OF LEFT BREAST TUMOR;  Surgeon: Coralie Keens, MD;  Location: [facility];  Service: General;  Laterality: Left;  LMA  . SKIN BIOPSY     ? basal/squam cell on chest  . TONSILLECTOMY AND ADENOIDECTOMY    . TOTAL KNEE ARTHROPLASTY Right 03/04/2019   RIGHT TOTAL KNEE ARTHROPLASTY;  [name], MD  . UPPER GI ENDOSCOPY       Family History  Problem Relation Age of Onset  . Diabetes Father   . Hypertension Mother   . Depression Mother   . Macular degeneration Mother   . Heart disease Mother      Social History   Tobacco Use  . Smoking status: Former    Packs/day: 0.50    Years: 15.00    Pack years: 7.50    Types: Cigarettes    Quit date: 03/23/1980    Years since quitting: 41.0  . Smokeless tobacco: Never  Vaping Use  . Vaping Use: Never used  Substance Use Topics  . Alcohol use: Yes    Alcohol/week: 0.0 standard drinks    Comment: social  . Drug use: No      ROS Constitutional: Denies fever, chills, weight loss/gain, headaches, insomnia,  night sweats, and change in appetite. Does c/o fatigue. Eyes: Denies redness, blurred vision, diplopia, discharge, itchy, watery eyes.  ENT: Denies discharge, congestion, post nasal drip, epistaxis, sore throat, earache, hearing loss, dental pain, Tinnitus, Vertigo, Sinus pain, snoring.  Cardio: Denies chest pain, palpitations, irregular heartbeat, syncope, dyspnea, diaphoresis, orthopnea, PND, claudication, edema Respiratory: denies cough, dyspnea, DOE, pleurisy, hoarseness, laryngitis, wheezing.  Gastrointestinal: Denies dysphagia, heartburn, reflux, water brash, pain, cramps, nausea, vomiting, bloating, diarrhea, constipation, hematemesis, melena, hematochezia, jaundice, hemorrhoids Genitourinary: Denies dysuria, frequency, urgency, nocturia, hesitancy, discharge, hematuria, flank pain Breast: Breast lumps, nipple discharge, bleeding.  Musculoskeletal: Denies arthralgia, myalgia,  stiffness, Jt. Swelling, pain, limp, and strain/sprain. Denies falls. Skin: Denies puritis, rash, hives, warts, acne, eczema, changing in skin lesion Neuro: No weakness, tremor, incoordination, spasms, paresthesia, pain Psychiatric: Denies confusion, memory loss, sensory loss. Denies Depression. Endocrine: Denies change in weight, skin, hair change, nocturia, and paresthesia, diabetic polys, visual blurring, hyper / hypo glycemic episodes.  Heme/Lymph: No excessive bleeding, bruising, enlarged lymph nodes.  Physical Exam  LMP  (LMP Unknown)   General Appearance: Over nourished, well groomed and  in no apparent distress.  Eyes: PERRLA, EOMs, conjunctiva no swelling or erythema, normal fundi and vessels. Sinuses: No frontal/maxillary tenderness ENT/Mouth: EACs patent / TMs  nl. Nares clear without erythema, swelling, mucoid exudates. Oral hygiene is good. No erythema, swelling, or exudate. Tongue normal, non-obstructing. Tonsils not swollen or erythematous. Hearing normal.  Neck: Supple, thyroid not palpable. No bruits, nodes or JVD. Respiratory: Respiratory effort normal.  BS equal and clear bilateral without rales, rhonci, wheezing or stridor. Cardio: Heart sounds are normal with regular rate and rhythm and no murmurs, rubs or gallops. Peripheral pulses are normal and equal bilaterally without edema. No aortic or femoral bruits. Chest: symmetric with normal excursions and percussion. Breasts: Deferred to MGM Abdomen: Flat, soft with bowel sounds active. Nontender, no guarding, rebound, hernias, masses, or organomegaly.  Lymphatics: Non tender without lymphadenopathy.  Musculoskeletal: Full ROM all peripheral extremities, joint stability, 5/5 strength, and normal gait. Skin: Warm and dry without rashes, lesions, cyanosis, clubbing or  ecchymosis.  Neuro: Cranial nerves intact, reflexes equal bilaterally. Normal muscle tone, no cerebellar symptoms. Sensation intact.  Pysch: Alert and oriented  X 3, normal affect, Insight and Judgment appropriate.    Assessment and Plan  1. Annual Preventative Screening Examination  2. Labile hypertension  - EKG 12-Lead - Urinalysis, Routine w reflex microscopic - Microalbumin / creatinine urine ratio - CBC with Differential/Platelet - COMPLETE METABOLIC PANEL WITH GFR - Magnesium - TSH  3. Hyperlipidemia, mixed  - EKG 12-Lead - Lipid panel - TSH  4. Abnormal glucose  - EKG 12-Lead - Hemoglobin A1c - Insulin, random  5. Vitamin D deficiency  - VITAMIN D 25 Hydroxy   6. Depression, major, in remission ([place])  - TSH  7. Screening for ischemic heart disease  - EKG 12-Lead  8. FHx: heart disease  - EKG 12-Lead  9. Former smoker  - EKG 12-Lead  10. History of left breast cancer   11. Screening for colorectal cancer  - POC Hemoccult Bld/Stl  12. Medication management  - Urinalysis, Routine w reflex microscopic - Microalbumin / creatinine urine ratio - CBC with Differential/Platelet - COMPLETE METABOLIC PANEL WITH GFR - Magnesium - Lipid panel - TSH - Hemoglobin A1c - Insulin, random - VITAMIN D 25 Hydroxy           Patient was counseled in prudent diet to achieve/maintain BMI less than 25 for weight control, BP monitoring, regular exercise and medications. Discussed med's effects and SE's. Screening labs and tests as requested with regular follow-up as recommended. Over 40 minutes of exam, counseling, chart review and high complex critical decision making was performed.   Kirtland Bouchard, MD

## 2022-05-30 NOTE — Progress Notes (Signed)
Annual Screening/Preventative Visit & Comprehensive Evaluation &  Examination   Future Appointments  Date Time Provider Department  05/31/2022  3:00 PM Lucky Cowboy, MD [facility]  06/29/2022 11:40 AM [facility]         This very nice 77 y.o.  MWF with  HTN, HLD, Prediabetes  and Vitamin D Deficiency  presents for a Screening /Preventative Visit & comprehensive evaluation and management of multiple medical co-morbidities.  Patient has hx/o Depression in remission on Effexor. Chest CT scan in May 2023 showed Aortic Atherosclerosis & a 7 x 6 mm LLL nodule and Dr Rennis Golden scheduled a f/u Chest CT          Patienty has been followed expectantly for labile elevated BP.  Patient's BP  alleged  has been controlled  at home  and patient denies any cardiac symptoms as chest pain, palpitations, shortness of breath, dizziness or ankle swelling. Today's BP  is not at goal -   160/80 and confirmed.                      Patient's hyperlipidemia has hx/o statin intolerance  &  allergy to Ezetimibe  &  is not controlled with diet .   Patient is also followed by Dr Rennis Golden in the [facility]. Patient denies myalgias or other medication SE's. Last lipids were not at goal :  Lab Results  Component Value Date   CHOL 326 (H) 11/23/2021   HDL 86 11/23/2021   LDLCALC 198 (H) 11/23/2021   TRIG 220 (H) 11/23/2021   CHOLHDL 3.8 11/23/2021         Patient has hx/o moderate obesity  (BMI 34+)  and is monitored expectantly for glucose intolerance  and patient denies reactive hypoglycemic symptoms, visual blurring, diabetic polys or paresthesias. Last A1c was normal & at goal :  Lab Results  Component Value Date   HGBA1C 5.5 11/19/2020         Finally, patient has history of Vitamin D Deficiency ("40" /2010) and last Vitamin D was not  at goal (70-100) :  Lab Results  Component Value Date   VD25OH 34 11/23/2021       Current Outpatient Medications  Medication Instructions    ADDERALL 30 MG tablet Take 1/2 to 1 tablet 2 x /day as needed for Focus & Concentration TEVA BRAND ONLY   CELEBREX  00 mg   Oral, 2 times daily   Vitamin D 10,000 Units    Daily   famciclovir (FAMVIR)  500 mg    2 times daily PRN     Allergies  Allergen Reactions   Zetia [Ezetimibe] Diarrhea    Patient states she had IBS, that took a year to resolve.   Prednisone Nausea And Vomiting   Statins     paralyzing   Strattera [Atomoxetine Hcl]     Increased sadness     Past Medical History:  Diagnosis Date   Allergy    Anxiety    Attention deficit disorder (ADD)    Cancer (HCC)    ? basal/squam cell on chest, and forehead   Depression    Dry eyes    GERD (gastroesophageal reflux disease)    diet controlled, no meds   HLD (hyperlipidemia)    HSV infection    Lumbar degenerative disc disease    Ocular rosacea    Osteopenia      Health Maintenance  Topic Date Due  Zoster Vaccines- Shingrix (1 of 2) Never done   Pneumonia Vaccine 91+ Years old (2 - PPSV23 if available, else PCV20) 03/23/2016   COVID-19 Vaccine (3 - Pfizer risk series) 05/29/2019   INFLUENZA VACCINE  09/28/2020   MAMMOGRAM  06/13/2022   TETANUS/TDAP  01/14/2025   COLONOSCOPY  07/13/2027   DEXA SCAN  Completed   Hepatitis C Screening  Completed   HPV VACCINES  Aged Out    Immunization History  Administered Date(s) Administered   DT  01/14/2015   Influenza, High Dose  04/30/2018, 11/13/2018, 01/06/2020   PFIZER SARS-COV-2 Vacc 04/07/2019, 05/01/2019   Pneumococcal -13 03/24/2015   Pneumococcal-23 12/31/2012   Td 03/29/2004    Last Colon - 07/12/2017 -  Dr Pollyann Samples - Recc 10 year f/u  due May 2029  (age 31 yo)    Last MGM - 06/14/2021 - scheduled 06/29/2022   Past Surgical History:  Procedure Laterality Date   BREAST LUMPECTOMY Left 07/18/2019   Procedure: LEFT BREAST LUMPECTOMY;  Surgeon: Abigail Miyamoto, MD;  Location: [facility];  Service: General;  Laterality: Left;  LMA    COLONOSCOPY  2019   RE-EXCISION OF BREAST LUMPECTOMY Left 09/05/2019   Procedure: RE-EXCISION OF LEFT BREAST TUMOR;  Surgeon: Abigail Miyamoto, MD;  Location: [facility] OR;  Service: General;  Laterality: Left;  LMA   SKIN BIOPSY     ? basal/squam cell on chest   TONSILLECTOMY AND ADENOIDECTOMY     TOTAL KNEE ARTHROPLASTY Right 03/04/2019   RIGHT TOTAL KNEE ARTHROPLASTY;  Eldred Manges, MD   UPPER GI ENDOSCOPY       Family History  Problem Relation Age of Onset   Diabetes Father    Hypertension Mother    Depression Mother    Macular degeneration Mother    Heart disease Mother      Social History   Tobacco Use   Smoking status: Former    Packs/day: 0.50    Years: 15.00    Pack years: 7.50    Types: Cigarettes    Quit date: 03/23/1980    Years since quitting: 41.0   Smokeless tobacco: Never  Vaping Use   Vaping Use: Never used  Substance Use Topics   Alcohol use: Yes    Alcohol/week: 0.0 standard drinks    Comment: social   Drug use: No      ROS Constitutional: Denies fever, chills, weight loss/gain, headaches, insomnia,  night sweats, and change in appetite. Does c/o fatigue. Eyes: Denies redness, blurred vision, diplopia, discharge, itchy, watery eyes.  ENT: Denies discharge, congestion, post nasal drip, epistaxis, sore throat, earache, hearing loss, dental pain, Tinnitus, Vertigo, Sinus pain, snoring.  Cardio: Denies chest pain, palpitations, irregular heartbeat, syncope, dyspnea, diaphoresis, orthopnea, PND, claudication, edema Respiratory: denies cough, dyspnea, DOE, pleurisy, hoarseness, laryngitis, wheezing.  Gastrointestinal: Denies dysphagia, heartburn, reflux, water brash, pain, cramps, nausea, vomiting, bloating, diarrhea, constipation, hematemesis, melena, hematochezia, jaundice, hemorrhoids Genitourinary: Denies dysuria, frequency, urgency, nocturia, hesitancy, discharge, hematuria, flank pain Breast: Breast lumps, nipple discharge, bleeding.  Musculoskeletal:  Denies arthralgia, myalgia, stiffness, Jt. Swelling, pain, limp, and strain/sprain. Denies falls. Skin: Denies puritis, rash, hives, warts, acne, eczema, changing in skin lesion Neuro: No weakness, tremor, incoordination, spasms, paresthesia, pain Psychiatric: Denies confusion, memory loss, sensory loss. Denies Depression. Endocrine: Denies change in weight, skin, hair change, nocturia, and paresthesia, diabetic polys, visual blurring, hyper / hypo glycemic episodes.  Heme/Lymph: No excessive bleeding, bruising, enlarged lymph nodes.  Physical Exam  BP (!) 160/80  Pulse 82   Temp 97.9 F (36.6 C)   Resp 16   Ht 5' 2.5" (1.588 m)   Wt 207 lb 6.4 oz (94.1 kg)   SpO2 96%   BMI 37.33 kg/m   General Appearance: Over nourished, well groomed and in no apparent distress.  Eyes: PERRLA, EOMs, conjunctiva no swelling or erythema, normal fundi and vessels. Sinuses: No frontal/maxillary tenderness ENT/Mouth: EACs patent / TMs  nl. Nares clear without erythema, swelling, mucoid exudates. Oral hygiene is good. No erythema, swelling, or exudate. Tongue normal, non-obstructing. Tonsils not swollen or erythematous. Hearing normal.  Neck: Supple, thyroid not palpable. No bruits, nodes or JVD. Respiratory: Respiratory effort normal.  BS equal and clear bilateral without rales, rhonci, wheezing or stridor. Cardio: Heart sounds are normal with regular rate and rhythm and no murmurs, rubs or gallops. Peripheral pulses are normal and equal bilaterally without edema. No aortic or femoral bruits. Chest: symmetric with normal excursions and percussion. Breasts: Deferred to MGM Abdomen: Flat, soft with bowel sounds active. Nontender, no guarding, rebound, hernias, masses, or organomegaly.  Lymphatics: Non tender without lymphadenopathy.  Musculoskeletal: Full ROM all peripheral extremities, joint stability, 5/5 strength, and normal gait. Skin: Warm and dry without rashes, lesions, cyanosis, clubbing or   ecchymosis.  Neuro: Cranial nerves intact, reflexes equal bilaterally. Normal muscle tone, no cerebellar symptoms. Sensation intact.  Pysch: Alert and oriented X 3, normal affect, Insight and Judgment appropriate.    Assessment and Plan  1. Annual Preventative Screening Examination   2. Labile hypertension  - EKG 12-Lead - Urinalysis, Routine w reflex microscopic - Microalbumin / creatinine urine ratio - CBC with Differential/Platelet - COMPLETE METABOLIC PANEL WITH GFR - Magnesium - TSH  3. Hyperlipidemia, mixed  - Management per Dr Blanchie Dessert recommendations  - EKG 12-Lead - Lipid panel - TSH  4. Abnormal glucose  - EKG 12-Lead - Hemoglobin A1c - Insulin, random  5. Vitamin D deficiency  - VITAMIN D 25 Hydroxy   6. Depression, major, in remission (HCC)  - TSH  7. Screening for ischemic heart disease  - EKG 12-Lead  8. FHx: heart disease  - EKG 12-Lead  9. Former smoker  - EKG 12-Lead  10. History of left breast cancer   11. Screening for colorectal cancer  - POC Hemoccult Bld/Stl  12. Medication management  - Urinalysis, Routine w reflex microscopic - Microalbumin / creatinine urine ratio - CBC with Differential/Platelet - COMPLETE METABOLIC PANEL WITH GFR - Magnesium - Lipid panel - TSH - Hemoglobin A1c - Insulin, random - VITAMIN D 25 Hydroxy           Patient was counseled in prudent diet to achieve/maintain BMI less than 25 for weight control, BP monitoring, regular exercise and medications. Discussed med's effects and SE's. Screening labs and tests as requested with regular follow-up as recommended. Over 40 minutes of exam, counseling, chart review and high complex critical decision making was performed.   Marinus Maw, MD

## 2022-05-30 NOTE — Patient Instructions (Addendum)

## 2022-05-31 ENCOUNTER — Ambulatory Visit: Payer: Medicare Other | Admitting: Rehabilitative and Restorative Service Providers"

## 2022-05-31 ENCOUNTER — Ambulatory Visit (INDEPENDENT_AMBULATORY_CARE_PROVIDER_SITE_OTHER): Payer: Medicare Other | Admitting: Internal Medicine

## 2022-05-31 ENCOUNTER — Encounter: Payer: Self-pay | Admitting: Internal Medicine

## 2022-05-31 ENCOUNTER — Encounter: Payer: Self-pay | Admitting: Rehabilitative and Restorative Service Providers"

## 2022-05-31 VITALS — BP 160/80 | HR 82 | Temp 97.9°F | Resp 16 | Ht 62.5 in | Wt 207.4 lb

## 2022-05-31 DIAGNOSIS — E782 Mixed hyperlipidemia: Secondary | ICD-10-CM | POA: Diagnosis not present

## 2022-05-31 DIAGNOSIS — Z79899 Other long term (current) drug therapy: Secondary | ICD-10-CM | POA: Diagnosis not present

## 2022-05-31 DIAGNOSIS — M6281 Muscle weakness (generalized): Secondary | ICD-10-CM

## 2022-05-31 DIAGNOSIS — R7309 Other abnormal glucose: Secondary | ICD-10-CM | POA: Diagnosis not present

## 2022-05-31 DIAGNOSIS — R600 Localized edema: Secondary | ICD-10-CM

## 2022-05-31 DIAGNOSIS — M25561 Pain in right knee: Secondary | ICD-10-CM

## 2022-05-31 DIAGNOSIS — R0989 Other specified symptoms and signs involving the circulatory and respiratory systems: Secondary | ICD-10-CM

## 2022-05-31 DIAGNOSIS — Z136 Encounter for screening for cardiovascular disorders: Secondary | ICD-10-CM

## 2022-05-31 DIAGNOSIS — Z0000 Encounter for general adult medical examination without abnormal findings: Secondary | ICD-10-CM

## 2022-05-31 DIAGNOSIS — Z853 Personal history of malignant neoplasm of breast: Secondary | ICD-10-CM

## 2022-05-31 DIAGNOSIS — F325 Major depressive disorder, single episode, in full remission: Secondary | ICD-10-CM

## 2022-05-31 DIAGNOSIS — G8929 Other chronic pain: Secondary | ICD-10-CM | POA: Diagnosis not present

## 2022-05-31 DIAGNOSIS — R262 Difficulty in walking, not elsewhere classified: Secondary | ICD-10-CM | POA: Diagnosis not present

## 2022-05-31 DIAGNOSIS — Z0001 Encounter for general adult medical examination with abnormal findings: Secondary | ICD-10-CM

## 2022-05-31 DIAGNOSIS — Z1211 Encounter for screening for malignant neoplasm of colon: Secondary | ICD-10-CM

## 2022-05-31 DIAGNOSIS — E559 Vitamin D deficiency, unspecified: Secondary | ICD-10-CM

## 2022-05-31 DIAGNOSIS — Z87891 Personal history of nicotine dependence: Secondary | ICD-10-CM

## 2022-05-31 DIAGNOSIS — Z8249 Family history of ischemic heart disease and other diseases of the circulatory system: Secondary | ICD-10-CM

## 2022-05-31 NOTE — Therapy (Signed)
OUTPATIENT PHYSICAL THERAPY TREATMENT   Patient Name: Kathy Cook MRN: [id number] DOB:03/07/1945, 77 y.o., female Today's Date: 05/31/2022  END OF SESSION:  PT End of Session - 05/31/22 1013     Visit Number 2    Number of Visits 15    Date for PT Re-Evaluation 07/20/22    Authorization Type [employer] MCR    Progress Note Due on Visit 10    PT Start Time 1009    PT Stop Time 1048    PT Time Calculation (min) 39 min    Activity Tolerance Patient tolerated treatment well    Behavior During Therapy WFL for tasks assessed/performed              Past Medical History:  Diagnosis Date   Allergy    Anxiety    Attention deficit disorder (ADD)    Cancer    ? basal/squam cell on chest, and forehead   Depression    Dry eyes    GERD (gastroesophageal reflux disease)    diet controlled, no meds   HLD (hyperlipidemia)    HSV infection    Lumbar degenerative disc disease    Ocular rosacea    Osteopenia    Personal history of radiation therapy    Past Surgical History:  Procedure Laterality Date   BREAST BIOPSY Left 05/28/2019   BREAST LUMPECTOMY Left 07/18/2019   Procedure: LEFT BREAST LUMPECTOMY;  Surgeon: Coralie Keens, MD;  Location: [place];  Service: General;  Laterality: Left;  LMA   COLONOSCOPY  2019   RE-EXCISION OF BREAST LUMPECTOMY Left 09/05/2019   Procedure: RE-EXCISION OF LEFT BREAST TUMOR;  Surgeon: Coralie Keens, MD;  Location: [place];  Service: General;  Laterality: Left;  LMA   SKIN BIOPSY     ?basal/squam cell on chest   TONSILLECTOMY AND ADENOIDECTOMY     TOTAL KNEE ARTHROPLASTY Right 03/04/2019   Procedure: RIGHT TOTAL KNEE ARTHROPLASTY;  Surgeon: [name], MD;  Location: [place];  Service: Orthopedics;  Laterality: Right;   UPPER GI ENDOSCOPY     Patient Active Problem List   Diagnosis Date Noted   Quadriceps weakness 05/17/2022   Acute anal fissure 05/17/2020   Phyllodes tumor of breast 08/07/2019   Ductal carcinoma in  situ (DCIS) of left breast 08/06/2019   Status post total hip replacement, right 03/05/2019   Arthritis of right knee 03/04/2019   Unilateral primary osteoarthritis, right knee 02/05/2019   Knee locking, right 01/09/2019   Metatarsal stress fracture, right, initial encounter 10/03/2016   DJD (degenerative joint disease) 06/09/2016   Labile hypertension 08/01/2014   Vitamin D deficiency 08/01/2014   Medication management 08/01/2014   Obesity 07/04/2014   Hyperlipemia 06/27/2013   Attention deficit disorder (ADD)    GERD (gastroesophageal reflux disease)    Allergy    Depression, major, in remission ([place])    Anemia    Ocular rosacea    HSV infection    Osteopenia     PCP: Unk Pinto, MD   REFERRING PROVIDER: [name], MD,  REFERRING DIAG:  [phone] (ICD-10-CM) - Chronic pain of right knee  M62.81 (ICD-10-CM) - Quadriceps weakness    THERAPY DIAG:  Chronic pain of right knee  Muscle weakness (generalized)  Difficulty in walking, not elsewhere classified  Localized edema  Rationale for Evaluation and Treatment: Rehabilitation  ONSET DATE: Rt TKA 03/04/19 with residual quad weakness  SUBJECTIVE:   SUBJECTIVE STATEMENT: She indicated not having pain but having some discomfort  and soreness.    PERTINENT HISTORY:PMH: Rt TKA 03/04/19, Ca,lumbar DDD, osteopenia,  PAIN:  NPRS scale: no pain Pain location: anterior Rt knee Pain description: irritating and sore Aggravating factors: walking, stairs Relieving factors: rest  PRECAUTIONS: None  WEIGHT BEARING RESTRICTIONS: No  FALLS:  Has patient fallen in last 6 months? No  OCCUPATION:   PLOF: Independent  PATIENT GOALS: improve strength and walk longer as she has trip coming up to [place] end of May.   NEXT MD VISIT: nothing scheduled  OBJECTIVE:   DIAGNOSTIC FINDINGS:  05/25/2022 Result Date: 05/17/2022 Standing AP both knees lateral right knee and sunrise patella x-ray demonstrates  satisfactory right total knee arthroplasty without loosening or subsidence.  Opposite left knee shows mild arthritic changes. Impression: Satisfactory right total knee arthroplasty unchanged from January 2021 images.   PATIENT SURVEYS:  05/25/2022 FOTO wasn't set up for her so will omit  COGNITION: 05/25/2022 Overall cognitive status: Within functional limits for tasks assessed     SENSATION: 05/25/2022 She reports light sensation still diminished some around Rt knee  EDEMA:  05/25/2022 Eval: mild edema in Rt knee  MUSCLE LENGTH: 05/25/2022 Tight quads on Rt  POSTURE:   PALPATION:   LOWER EXTREMITY ROM:  Active ROM/PROM Right 05/25/2022 Left 05/25/2022  Hip flexion    Hip extension    Hip abduction    Hip adduction    Hip internal rotation    Hip external rotation    Knee flexion 115/120   Knee extension 0   Ankle dorsiflexion    Ankle plantarflexion    Ankle inversion    Ankle eversion     (Blank rows = not tested)  LOWER EXTREMITY MMT:  MMT Right 05/25/2022 Left 05/25/2022  Hip flexion 4 4+  Hip extension    Hip abduction 4 4+  Hip adduction    Hip internal rotation    Hip external rotation    Knee flexion 5 5  Knee extension 4 43# 4 39#  Ankle dorsiflexion    Ankle plantarflexion    Ankle inversion    Ankle eversion     (Blank rows = not tested)  LOWER EXTREMITY SPECIAL TESTS:    FUNCTIONAL TESTS:  05/25/2022: 5 times sit to stand: 8.9 seconds without UE support SLS avg of 6 seconds bilat  GAIT:Eval Comments: WFL gait, independent community Ambulator but does have pain with prolonged walking  TODAY'S TREATMENT:  05/31/2022 Therex: Nustep lvl 5 8 mins LE only  Leg press double leg 112 lbs x 15, single leg 50 lbs 2 x 10 bilateral 56 lbs Incline calf stretch 30 sec x 3 bilateral Step on over and down 4 inch step x 15 bilateral Lateral step down 4 inch x 10 bilateral Seated SLR 2 x 10 bilaterally  Knee extension machine double leg up, single  lower eccentrically 2 x 10 10 lbs    05/25/2022 HEP creation and review with demonstration and trial set preformed, see below for details Nu step LE only L5 X 6 min Partial lunges with UE support X 10 bilat, slow eccentrics Standing quad stretch with strap and UE support 3 X 30 sec on Rt Reviewed her gym program and home work out program    PATIENT EDUCATION: 05/31/2022 Education details: HEP update Person educated: Patient Education method: Explanation, Demonstration, Verbal cues, and Handouts Education comprehension: verbalized understanding and needs further education   HOME EXERCISE PROGRAM: Access Code: [email or web address] URL: [email or web address] Date: 05/25/2022 Prepared by: Elsie Ra  Exercises - Standing  Sports administrator with Strap  - 1 x daily - 6 x weekly - 1 sets - 3 reps - 30 sec hold - Standing Partial Lunge  - 1 x daily - 6 x weekly - 2 sets - 10 reps - Step Up  - 1 x daily - 6 x weekly - 1 sets - 10 reps - Sit to Stand Without Arm Support  - 1 x daily - 6 x weekly - 1-2 sets - 10 reps - Full Leg Press  - 1 x daily - 6 x weekly - 2-3 sets - 10-20 reps - Single Leg Press  - 1 x daily - 6 x weekly - 2-3 sets - 10-20 reps - Single Leg Knee Extension with Weight Machine  - 1 x daily - 6 x weekly - 2-3 sets - 10-20 reps  ASSESSMENT:  CLINICAL IMPRESSION:  Continued with plan for strengthening improvements.  Cues given for eccentric control focus on most exercises.  Pt to benefit from skilled PT services to continued improvements towards goals.   OBJECTIVE IMPAIRMENTS: decreased activity tolerance, difficulty walking, decreased balance, decreased endurance, decreased mobility, decreased ROM, decreased strength, impaired flexibility, impaired LE use, postural dysfunction, and pain.  ACTIVITY LIMITATIONS: bending, lifting, carry, locomotion, cleaning, community activity, driving, and or occupation  PERSONAL FACTORS: PMH: Rt TKA 03/04/19, Ca,lumbar DDD, osteopenia,  are also affecting patient's functional outcome.  REHAB POTENTIAL: Good  CLINICAL DECISION MAKING: Stable/uncomplicated  EVALUATION COMPLEXITY: Low    GOALS: Short term PT Goals Target date: 06/22/2022   Pt will be I and compliant with HEP. Baseline:  Goal status: on going 05/31/2022 Pt will decrease pain by 25% overall Baseline: Goal status: on going 05/31/2022  Long term PT goals Target date:07/20/2022 Pt will improve  hip/knee strength to at least 5-/5 MMT to improve functional strength Baseline: Goal status: on going 05/31/2022 Pt will reduce pain to overall less than 2-3/10 with usual activity, walking, standing Baseline: Goal status: on going 05/31/2022 Pt will be able to ambulate community distances at least 1000 ft WNL gait pattern without complaints and navigate one flight of stairs reciprocal without complaints Baseline: Goal status: on going 05/31/2022  PLAN: PT FREQUENCY: 1-3 times per week   PT DURATION: 8 weeks  PLANNED INTERVENTIONS (unless contraindicated): aquatic PT, Canalith repositioning, cryotherapy, Electrical stimulation, Iontophoresis with 4 mg/ml dexamethasome, Moist heat, traction, Ultrasound, gait training, Therapeutic exercise, balance training, neuromuscular re-education, patient/family education, prosthetic training, manual techniques, passive ROM, dry needling, taping, vasopnuematic device, vestibular, spinal manipulations, joint manipulations  PLAN FOR NEXT SESSION:   Progressive strengthening avoiding pain increase.    Scot Jun, PT, DPT, OCS, ATC 05/31/22  10:48 AM

## 2022-06-01 LAB — CBC WITH DIFFERENTIAL/PLATELET
Absolute Monocytes: 525 cells/uL (ref 200–950)
Basophils Absolute: 38 cells/uL (ref 0–200)
Basophils Relative: 0.5 %
Eosinophils Absolute: 143 cells/uL (ref 15–500)
Eosinophils Relative: 1.9 %
HCT: 39.2 % (ref 35.0–45.0)
Hemoglobin: 13.3 g/dL (ref 11.7–15.5)
Lymphs Abs: 1515 cells/uL (ref 850–3900)
MCH: 32 pg (ref 27.0–33.0)
MCHC: 33.9 g/dL (ref 32.0–36.0)
MCV: 94.5 fL (ref 80.0–100.0)
MPV: 11.2 fL (ref 7.5–12.5)
Monocytes Relative: 7 %
Neutro Abs: 5280 cells/uL (ref 1500–7800)
Neutrophils Relative %: 70.4 %
Platelets: 219 10*3/uL (ref 140–400)
RBC: 4.15 10*6/uL (ref 3.80–5.10)
RDW: 12 % (ref 11.0–15.0)
Total Lymphocyte: 20.2 %
WBC: 7.5 10*3/uL (ref 3.8–10.8)

## 2022-06-01 LAB — MAGNESIUM: Magnesium: 2.1 mg/dL (ref 1.5–2.5)

## 2022-06-01 LAB — MICROALBUMIN / CREATININE URINE RATIO
Creatinine, Urine: 38 mg/dL (ref 20–275)
Microalb, Ur: <0.2 mg/dL

## 2022-06-01 LAB — COMPLETE METABOLIC PANEL WITH GFR
AG Ratio: 1.8 (calc) (ref 1.0–2.5)
ALT: 13 U/L (ref 6–29)
AST: 11 U/L (ref 10–35)
Albumin: 4.4 g/dL (ref 3.6–5.1)
Alkaline phosphatase (APISO): 76 U/L (ref 37–153)
BUN/Creatinine Ratio: 32 (calc) — ABNORMAL HIGH (ref 6–22)
BUN: 28 mg/dL — ABNORMAL HIGH (ref 7–25)
CO2: 27 mmol/L (ref 20–32)
Calcium: 9.7 mg/dL (ref 8.6–10.4)
Chloride: 103 mmol/L (ref 98–110)
Creat: 0.88 mg/dL (ref 0.60–1.00)
Globulin: 2.4 g/dL (calc) (ref 1.9–3.7)
Glucose, Bld: 113 mg/dL — ABNORMAL HIGH (ref 65–99)
Potassium: 4.4 mmol/L (ref 3.5–5.3)
Sodium: 140 mmol/L (ref 135–146)
Total Bilirubin: 0.4 mg/dL (ref 0.2–1.2)
Total Protein: 6.8 g/dL (ref 6.1–8.1)
eGFR: 69 mL/min/{1.73_m2} (ref >=60)

## 2022-06-01 LAB — TSH: TSH: 2.24 mIU/L (ref 0.40–4.50)

## 2022-06-01 LAB — HEMOGLOBIN A1C
Hgb A1c MFr Bld: 5.9 % of total Hgb — ABNORMAL HIGH (ref <5.7)
Mean Plasma Glucose: 123 mg/dL
eAG (mmol/L): 6.8 mmol/L

## 2022-06-01 LAB — URINALYSIS, ROUTINE W REFLEX MICROSCOPIC
Bilirubin Urine: NEGATIVE
Glucose, UA: NEGATIVE
Hgb urine dipstick: NEGATIVE
Ketones, ur: NEGATIVE
Leukocytes,Ua: NEGATIVE
Nitrite: NEGATIVE
Protein, ur: NEGATIVE
Specific Gravity, Urine: 1.01 (ref 1.001–1.035)
pH: 6 (ref 5.0–8.0)

## 2022-06-01 LAB — INSULIN, RANDOM: Insulin: 52.5 u[IU]/mL — ABNORMAL HIGH

## 2022-06-01 LAB — VITAMIN D 25 HYDROXY (VIT D DEFICIENCY, FRACTURES): Vit D, 25-Hydroxy: 39 ng/mL (ref 30–100)

## 2022-06-01 NOTE — Progress Notes (Signed)
<><><><><><><><><><><><><><><><><><><><><><><><><><><><><><><><><> <><><><><><><><><><><><><><><><><><><><><><><><><><><><><><><><><> - Test results slightly outside the reference range are not unusual. If there is anything important, I will review this with you,  otherwise it is considered normal test values.  If you have further questions,  please do not hesitate to contact me at the office or via My Chart.  <><><><><><><><><><><><><><><><><><><><><><><><><><><><><><><><><> <><><><><><><><><><><><><><><><><><><><><><><><><><><><><><><><><>   - Kidney functions ( GFR ) look a little dehydrated   -  Very important to drink adequate amounts of fluids to prevent permanent damage    - Recommend drink at least 6 bottles (16 ounces)                                                                         of fluids /water /day = 96 Oz ~100 oz  - 100 oz = 3,000 cc or 3 liters / day  - >>                                                       That's 1 &1/2 bottles of a 2 liter soda bottle /day !  <><><><><><><><><><><><><><><><><><><><><><><><><><><><><><><><><>  -  A1c = 5.9% Blood sugar and A1c  is now  elevated in the borderline and                                                           early or pre-diabetes range which has the same   300% increased risk for heart attack, stroke, cancer and                                                                alzheimer- type vascular dementia                                                                                                    as full blown diabetes.   But the good news is that diet, exercise with weight loss can cure  the early diabetes at this point. <><><><><><><><><><><><><><><><><><><><><><><><><><><><><><><><><> <><><><><><><><><><><><><><><><><><><><><><><><><><><><><><><><><>  -  Insulin = 52.5 is elevated  ( Normal is less than 20 ! )  and shows insulin resistance    -  Insulin Resistance is a sign of early diabetes and associated with a   300 % greater risk for heart attacks, strokes, cancer &                                                             Alzheimer type vascular dementia   - All this can be cured  and prevented with losing weight   - get Dr Fara Olden Fuhrman's book 'the End of Diabetes" and "the End of Dieting"  - and add many years of good health to your life. <><><><><><><><><><><><><><><><><><><><><><><><><><><><><><><><><>  -  Vitamin D = 39 is Low   - Vitamin D goal is between 70-100.   - Please INCREASE your Vitamin D 5,000 unit capsule to                                                                           take 2 capsules = 10,000 units Daily .   - It is very important as a natural anti-inflammatory and helping the                                 immune system protect against viral infections, like the Covid-19    - helps hair, skin, and nails, as well as reducing stroke and heart attack risk.   - It helps your bones and helps with mood.  - It also decreases numerous cancer risks so please take it as directed.   - Low Vit D is associated with a 200-300% higher risk for CANCER   and 200-300% higher risk for HEART   ATTACK  &  STROKE.    - It is also associated with higher death rate at younger ages,   autoimmune diseases like Rheumatoid arthritis, Lupus, Multiple Sclerosis.     - Also many other serious conditions, like depression, Alzheimer'sDementia,   muscle aches, fatigue, fibromyalgia   <><><><><><><><><><><><><><><><><><><><><><><><><><><><><><><><><> <><><><><><><><><><><><><><><><><><><><><><><><><><><><><><><><><>  -  All Else - CBC - Kidneys - Electrolytes - Liver - Magnesium & Thyroid    - all  Normal / OK <><><><><><><><><><><><><><><><><><><><><><><><><><><><><><><><><> <><><><><><><><><><><><><><><><><><><><><><><><><><><><><><><><><>

## 2022-06-02 ENCOUNTER — Telehealth: Payer: Self-pay | Admitting: Internal Medicine

## 2022-06-02 ENCOUNTER — Encounter: Payer: Self-pay | Admitting: Physical Therapy

## 2022-06-02 ENCOUNTER — Ambulatory Visit: Payer: Medicare Other | Admitting: Physical Therapy

## 2022-06-02 DIAGNOSIS — R262 Difficulty in walking, not elsewhere classified: Secondary | ICD-10-CM | POA: Diagnosis not present

## 2022-06-02 DIAGNOSIS — M6281 Muscle weakness (generalized): Secondary | ICD-10-CM

## 2022-06-02 DIAGNOSIS — M25561 Pain in right knee: Secondary | ICD-10-CM | POA: Diagnosis not present

## 2022-06-02 DIAGNOSIS — R911 Solitary pulmonary nodule: Secondary | ICD-10-CM

## 2022-06-02 DIAGNOSIS — R600 Localized edema: Secondary | ICD-10-CM | POA: Diagnosis not present

## 2022-06-02 DIAGNOSIS — G8929 Other chronic pain: Secondary | ICD-10-CM

## 2022-06-02 NOTE — Telephone Encounter (Signed)
Per patient results of CT Cardiac scoring test,  "Will need repeat non-contrast chest CT in 6 months to follow-up on nodule." Does patient need to have this test ordered?

## 2022-06-02 NOTE — Therapy (Signed)
OUTPATIENT PHYSICAL THERAPY TREATMENT   Patient Name: Kathy Cook MRN: [id number] DOB:Sep 28, 1945, 77 y.o., female Today's Date: 06/02/2022  END OF SESSION:  PT End of Session - 06/02/22 1111     Visit Number 3    Number of Visits 15    Date for PT Re-Evaluation 07/20/22    Authorization Type U[employer] MCR    Progress Note Due on Visit 10    PT Start Time 1103    PT Stop Time 1142    PT Time Calculation (min) 39 min    Activity Tolerance Patient tolerated treatment well    Behavior During Therapy WFL for tasks assessed/performed               Past Medical History:  Diagnosis Date   Allergy    Anxiety    Attention deficit disorder (ADD)    Cancer    ? basal/squam cell on chest, and forehead   Depression    Dry eyes    GERD (gastroesophageal reflux disease)    diet controlled, no meds   HLD (hyperlipidemia)    HSV infection    Lumbar degenerative disc disease    Ocular rosacea    Osteopenia    Personal history of radiation therapy    Past Surgical History:  Procedure Laterality Date   BREAST BIOPSY Left 05/28/2019   BREAST LUMPECTOMY Left 07/18/2019   Procedure: LEFT BREAST LUMPECTOMY;  Surgeon: Coralie Keens, MD;  Location: [facility];  Service: General;  Laterality: Left;  LMA   COLONOSCOPY  2019   RE-EXCISION OF BREAST LUMPECTOMY Left 09/05/2019   Procedure: RE-EXCISION OF LEFT BREAST TUMOR;  Surgeon: Coralie Keens, MD;  Location: [facility];  Service: General;  Laterality: Left;  LMA   SKIN BIOPSY     ?basal/squam cell on chest   TONSILLECTOMY AND ADENOIDECTOMY     TOTAL KNEE ARTHROPLASTY Right 03/04/2019   Procedure: RIGHT TOTAL KNEE ARTHROPLASTY;  Surgeon: [name], MD;  Location: [facility];  Service: Orthopedics;  Laterality: Right;   UPPER GI ENDOSCOPY     Patient Active Problem List   Diagnosis Date Noted   Quadriceps weakness 05/17/2022   Acute anal fissure 05/17/2020   Phyllodes tumor of breast 08/07/2019   Ductal carcinoma  in situ (DCIS) of left breast 08/06/2019   Status post total hip replacement, right 03/05/2019   Arthritis of right knee 03/04/2019   Unilateral primary osteoarthritis, right knee 02/05/2019   Knee locking, right 01/09/2019   Metatarsal stress fracture, right, initial encounter 10/03/2016   DJD (degenerative joint disease) 06/09/2016   Labile hypertension 08/01/2014   Vitamin D deficiency 08/01/2014   Medication management 08/01/2014   Obesity 07/04/2014   Hyperlipemia 06/27/2013   Attention deficit disorder (ADD)    GERD (gastroesophageal reflux disease)    Allergy    Depression, major, in remission ([facility])    Anemia    Ocular rosacea    HSV infection    Osteopenia     PCP: Unk Pinto, MD   REFERRING PROVIDER: [name], MD,  REFERRING DIAG:  [phone] (ICD-10-CM) - Chronic pain of right knee  M62.81 (ICD-10-CM) - Quadriceps weakness    THERAPY DIAG:  Chronic pain of right knee  Muscle weakness (generalized)  Difficulty in walking, not elsewhere classified  Localized edema  Rationale for Evaluation and Treatment: Rehabilitation  ONSET DATE: Rt TKA 03/04/19 with residual quad weakness  SUBJECTIVE:   SUBJECTIVE STATEMENT:  Things are feeling better, stairs are getting  easier   PERTINENT HISTORY:PMH: Rt TKA 03/04/19, Ca,lumbar DDD, osteopenia,  PAIN:  NPRS scale: no pain 0/10 Pain location:  Pain description:  Aggravating factors:  Relieving factors:   PRECAUTIONS: None  WEIGHT BEARING RESTRICTIONS: No  FALLS:  Has patient fallen in last 6 months? No  OCCUPATION:   PLOF: Independent  PATIENT GOALS: improve strength and walk longer as she has trip coming up to [place] end of May.   NEXT MD VISIT: nothing scheduled  OBJECTIVE:   DIAGNOSTIC FINDINGS:  05/25/2022 Result Date: 05/17/2022 Standing AP both knees lateral right knee and sunrise patella x-ray demonstrates satisfactory right total knee arthroplasty without loosening or  subsidence.  Opposite left knee shows mild arthritic changes. Impression: Satisfactory right total knee arthroplasty unchanged from January 2021 images.   PATIENT SURVEYS:  05/25/2022 FOTO wasn't set up for her so will omit  COGNITION: 05/25/2022 Overall cognitive status: Within functional limits for tasks assessed     SENSATION: 05/25/2022 She reports light sensation still diminished some around Rt knee  EDEMA:  05/25/2022 Eval: mild edema in Rt knee  MUSCLE LENGTH: 05/25/2022 Tight quads on Rt  POSTURE:   PALPATION:   LOWER EXTREMITY ROM:  Active ROM/PROM Right 05/25/2022 Left 05/25/2022  Hip flexion    Hip extension    Hip abduction    Hip adduction    Hip internal rotation    Hip external rotation    Knee flexion 115/120   Knee extension 0   Ankle dorsiflexion    Ankle plantarflexion    Ankle inversion    Ankle eversion     (Blank rows = not tested)  LOWER EXTREMITY MMT:  MMT Right 05/25/2022 Left 05/25/2022  Hip flexion 4 4+  Hip extension    Hip abduction 4 4+  Hip adduction    Hip internal rotation    Hip external rotation    Knee flexion 5 5  Knee extension 4 43# 4 39#  Ankle dorsiflexion    Ankle plantarflexion    Ankle inversion    Ankle eversion     (Blank rows = not tested)  LOWER EXTREMITY SPECIAL TESTS:    FUNCTIONAL TESTS:  05/25/2022: 5 times sit to stand: 8.9 seconds without UE support SLS avg of 6 seconds bilat  GAIT:Eval Comments: WFL gait, independent community Ambulator but does have pain with prolonged walking  TODAY'S TREATMENT:    06/02/22  TherEx  Nustep L5 x 8 minutes BLEs only  Shuttle LE press power up/slow down 125# x12 reps BLEs Single LE press power up/slow down 62# x12 B  Doorway calf stretch 2x30 seconds B Supine quad stretch B 2x30 seconds B  Supine hip flexor B 2x30 seconds SLR + ER 2x10 B STS 2 inch box under opposite LE 1x10 B       05/31/2022 Therex: Nustep lvl 5 8 mins LE only  Leg press  double leg 112 lbs x 15, single leg 50 lbs 2 x 10 bilateral 56 lbs Incline calf stretch 30 sec x 3 bilateral Step on over and down 4 inch step x 15 bilateral Lateral step down 4 inch x 10 bilateral Seated SLR 2 x 10 bilaterally  Knee extension machine double leg up, single lower eccentrically 2 x 10 10 lbs    05/25/2022 HEP creation and review with demonstration and trial set preformed, see below for details Nu step LE only L5 X 6 min Partial lunges with UE support X 10 bilat, slow eccentrics Standing quad stretch with  strap and UE support 3 X 30 sec on Rt Reviewed her gym program and home work out program    PATIENT EDUCATION: 05/31/2022 Education details: HEP update Person educated: Patient Education method: Explanation, Demonstration, Verbal cues, and Handouts Education comprehension: verbalized understanding and needs further education   HOME EXERCISE PROGRAM: Access Code: [email or web address] URL: [email or web address] Date: 05/25/2022 Prepared by: Elsie Ra  Exercises - Standing Quad Stretch with Strap  - 1 x daily - 6 x weekly - 1 sets - 3 reps - 30 sec hold - Standing Partial Lunge  - 1 x daily - 6 x weekly - 2 sets - 10 reps - Step Up  - 1 x daily - 6 x weekly - 1 sets - 10 reps - Sit to Stand Without Arm Support  - 1 x daily - 6 x weekly - 1-2 sets - 10 reps - Full Leg Press  - 1 x daily - 6 x weekly - 2-3 sets - 10-20 reps - Single Leg Press  - 1 x daily - 6 x weekly - 2-3 sets - 10-20 reps - Single Leg Knee Extension with Weight Machine  - 1 x daily - 6 x weekly - 2-3 sets - 10-20 reps  ASSESSMENT:  CLINICAL IMPRESSION:   Ms. Shutt arrives today doing well, still not having pain in her knee and stairs are getting easier. Continued focus on functional strengthening with progressions as tolerated today. Will continue efforts.   OBJECTIVE IMPAIRMENTS: decreased activity tolerance, difficulty walking, decreased balance, decreased endurance, decreased  mobility, decreased ROM, decreased strength, impaired flexibility, impaired LE use, postural dysfunction, and pain.  ACTIVITY LIMITATIONS: bending, lifting, carry, locomotion, cleaning, community activity, driving, and or occupation  PERSONAL FACTORS: PMH: Rt TKA 03/04/19, Ca,lumbar DDD, osteopenia, are also affecting patient's functional outcome.  REHAB POTENTIAL: Good  CLINICAL DECISION MAKING: Stable/uncomplicated  EVALUATION COMPLEXITY: Low    GOALS: Short term PT Goals Target date: 06/22/2022   Pt will be I and compliant with HEP. Baseline:  Goal status: on going 05/31/2022 Pt will decrease pain by 25% overall Baseline: Goal status: on going 05/31/2022  Long term PT goals Target date:07/20/2022 Pt will improve  hip/knee strength to at least 5-/5 MMT to improve functional strength Baseline: Goal status: on going 05/31/2022 Pt will reduce pain to overall less than 2-3/10 with usual activity, walking, standing Baseline: Goal status: on going 05/31/2022 Pt will be able to ambulate community distances at least 1000 ft WNL gait pattern without complaints and navigate one flight of stairs reciprocal without complaints Baseline: Goal status: on going 05/31/2022  PLAN: PT FREQUENCY: 1-3 times per week   PT DURATION: 8 weeks  PLANNED INTERVENTIONS (unless contraindicated): aquatic PT, Canalith repositioning, cryotherapy, Electrical stimulation, Iontophoresis with 4 mg/ml dexamethasome, Moist heat, traction, Ultrasound, gait training, Therapeutic exercise, balance training, neuromuscular re-education, patient/family education, prosthetic training, manual techniques, passive ROM, dry needling, taping, vasopnuematic device, vestibular, spinal manipulations, joint manipulations  PLAN FOR NEXT SESSION:   Progressive strengthening avoiding pain increase. How is HEP?   Deniece Ree PT DPT PN2

## 2022-06-02 NOTE — Telephone Encounter (Signed)
Pt would like a callback regarding her having another CT Cardiac Scoring done. Please advise

## 2022-06-02 NOTE — Telephone Encounter (Signed)
Routing to correct triage pool 

## 2022-06-03 NOTE — Telephone Encounter (Signed)
Spoke with patient. She said she was told she needed a chest CT but that nurse she spoke with could not order it or schedule it. Explained that there is an order active, was to be done 12/2021.   Advised will reorder test, in the event it has expired/been cancelled and will send message to scheduling team to contact her to arrange an appointment.

## 2022-06-04 ENCOUNTER — Encounter: Payer: Self-pay | Admitting: Internal Medicine

## 2022-06-06 ENCOUNTER — Ambulatory Visit: Payer: Medicare Other | Admitting: Physical Therapy

## 2022-06-06 ENCOUNTER — Encounter: Payer: Self-pay | Admitting: Physical Therapy

## 2022-06-06 DIAGNOSIS — R262 Difficulty in walking, not elsewhere classified: Secondary | ICD-10-CM

## 2022-06-06 DIAGNOSIS — R600 Localized edema: Secondary | ICD-10-CM | POA: Diagnosis not present

## 2022-06-06 DIAGNOSIS — M25561 Pain in right knee: Secondary | ICD-10-CM

## 2022-06-06 DIAGNOSIS — M6281 Muscle weakness (generalized): Secondary | ICD-10-CM

## 2022-06-06 DIAGNOSIS — G8929 Other chronic pain: Secondary | ICD-10-CM

## 2022-06-06 NOTE — Therapy (Signed)
OUTPATIENT PHYSICAL THERAPY TREATMENT   Patient Name: Kathy Cook MRN: [id number] DOB:1945/07/27, 77 y.o., female Today's Date: 06/06/2022  END OF SESSION:  PT End of Session - 06/06/22 1300     Visit Number 4    Number of Visits 15    Date for PT Re-Evaluation 07/20/22    Authorization Type UHC MCR    Progress Note Due on Visit 10    PT Start Time 1300    PT Stop Time 1340    PT Time Calculation (min) 40 min    Activity Tolerance Patient tolerated treatment well    Behavior During Therapy WFL for tasks assessed/performed               Past Medical History:  Diagnosis Date   Allergy    Anxiety    Attention deficit disorder (ADD)    Cancer    ? basal/squam cell on chest, and forehead   Depression    Dry eyes    GERD (gastroesophageal reflux disease)    diet controlled, no meds   HLD (hyperlipidemia)    HSV infection    Lumbar degenerative disc disease    Ocular rosacea    Osteopenia    Personal history of radiation therapy    Past Surgical History:  Procedure Laterality Date   BREAST BIOPSY Left 05/28/2019   BREAST LUMPECTOMY Left 07/18/2019   Procedure: LEFT BREAST LUMPECTOMY;  Surgeon: Abigail MiyamotoBlackman, Douglas, MD;  Location: [facility];  Service: General;  Laterality: Left;  LMA   COLONOSCOPY  2019   RE-EXCISION OF BREAST LUMPECTOMY Left 09/05/2019   Procedure: RE-EXCISION OF LEFT BREAST TUMOR;  Surgeon: Abigail MiyamotoBlackman, Douglas, MD;  Location: [facility];  Service: General;  Laterality: Left;  LMA   SKIN BIOPSY     ?basal/squam cell on chest   TONSILLECTOMY AND ADENOIDECTOMY     TOTAL KNEE ARTHROPLASTY Right 03/04/2019   Procedure: RIGHT TOTAL KNEE ARTHROPLASTY;  Surgeon: Eldred MangesYates, Mark C, MD;  Location: [facility];  Service: Orthopedics;  Laterality: Right;   UPPER GI ENDOSCOPY     Patient Active Problem List   Diagnosis Date Noted   Quadriceps weakness 05/17/2022   Acute anal fissure 05/17/2020   Phyllodes tumor of breast 08/07/2019   Ductal carcinoma  in situ (DCIS) of left breast 08/06/2019   Status post total hip replacement, right 03/05/2019   Arthritis of right knee 03/04/2019   Unilateral primary osteoarthritis, right knee 02/05/2019   Knee locking, right 01/09/2019   Metatarsal stress fracture, right, initial encounter 10/03/2016   DJD (degenerative joint disease) 06/09/2016   Labile hypertension 08/01/2014   Vitamin D deficiency 08/01/2014   Medication management 08/01/2014   Obesity 07/04/2014   Hyperlipemia 06/27/2013   Attention deficit disorder (ADD)    GERD (gastroesophageal reflux disease)    Allergy    Depression, major, in remission ([facility])    Anemia    Ocular rosacea    HSV infection    Osteopenia     PCP: Lucky CowboyMcKeown, William, MD   REFERRING PROVIDER: Eldred MangesYates, Mark C, MD,  REFERRING DIAG:  [phone],G89.29 (ICD-10-CM) - Chronic pain of right knee  M62.81 (ICD-10-CM) - Quadriceps weakness    THERAPY DIAG:  Chronic pain of right knee  Muscle weakness (generalized)  Difficulty in walking, not elsewhere classified  Localized edema  Rationale for Evaluation and Treatment: Rehabilitation  ONSET DATE: Rt TKA 03/04/19 with residual quad weakness  SUBJECTIVE:   SUBJECTIVE STATEMENT: Relays she had some cramping in her quads  lately and feels like she strained her upper Rt back some.  PERTINENT HISTORY:PMH: Rt TKA 03/04/19, Ca,lumbar DDD, osteopenia,  PAIN:  NPRS scale: no pain 0/10 Pain location:  Pain description:  Aggravating factors:  Relieving factors:   PRECAUTIONS: None  WEIGHT BEARING RESTRICTIONS: No  FALLS:  Has patient fallen in last 6 months? No  OCCUPATION:   PLOF: Independent  PATIENT GOALS: improve strength and walk longer as she has trip coming up to [place] end of May.   NEXT MD VISIT: nothing scheduled  OBJECTIVE:   DIAGNOSTIC FINDINGS:  05/25/2022 Result Date: 05/17/2022 Standing AP both knees lateral right knee and sunrise patella x-ray demonstrates satisfactory right total  knee arthroplasty without loosening or subsidence.  Opposite left knee shows mild arthritic changes. Impression: Satisfactory right total knee arthroplasty unchanged from January 2021 images.   PATIENT SURVEYS:  05/25/2022 FOTO wasn't set up for her so will omit  COGNITION: 05/25/2022 Overall cognitive status: Within functional limits for tasks assessed     SENSATION: 05/25/2022 She reports light sensation still diminished some around Rt knee  EDEMA:  05/25/2022 Eval: mild edema in Rt knee  MUSCLE LENGTH: 05/25/2022 Tight quads on Rt  POSTURE:   PALPATION:   LOWER EXTREMITY ROM:  Active ROM/PROM Right 05/25/2022 Left 05/25/2022  Hip flexion    Hip extension    Hip abduction    Hip adduction    Hip internal rotation    Hip external rotation    Knee flexion 115/120   Knee extension 0   Ankle dorsiflexion    Ankle plantarflexion    Ankle inversion    Ankle eversion     (Blank rows = not tested)  LOWER EXTREMITY MMT:  MMT Right 05/25/2022 Left 05/25/2022  Hip flexion 4 4+  Hip extension    Hip abduction 4 4+  Hip adduction    Hip internal rotation    Hip external rotation    Knee flexion 5 5  Knee extension 4 43# 4 39#  Ankle dorsiflexion    Ankle plantarflexion    Ankle inversion    Ankle eversion     (Blank rows = not tested)  LOWER EXTREMITY SPECIAL TESTS:    FUNCTIONAL TESTS:  05/25/2022: 5 times sit to stand: 8.9 seconds without UE support SLS avg of 6 seconds bilat  GAIT:Eval Comments: WFL gait, independent community Ambulator but does have pain with prolonged walking  TODAY'S TREATMENT:  06/06/22  TherEx  Nustep L5 x 8 minutes UE/LE  Shuttle LE press power up/slow down 125# 2 X 10 reps BLEs Single LE press power up/slow down 125# x10 B (wanted to try this on her own accord) Leg extension machine Rt leg only 10# 2X10 (offered eccentrics but she wanted to do one leg only), left leg only 15# 2X10 Leg curl machine DL 60# 6T01 Sidestepping  with with green band around ankles and squat each rep at counter top 2 round trips Monster walking with with green band around ankles and squat each rep at counter top 2 round trips Seated SLR with 2# 2X10 each side Tandem stance 30 sec X 2 bilat Single leg stance 15 sec X 3 bilat Sit to stands no UE support X 10, Rt foot slightly further back   hip flexor/quad stretch at treadmill 30 sec X 3 bilat  06/02/22  TherEx  Nustep L5 x 8 minutes BLEs only  Shuttle LE press power up/slow down 125# x12 reps BLEs Single LE press power up/slow down 62#  x12 B  Doorway calf stretch 2x30 seconds B Supine quad stretch B 2x30 seconds B  Supine hip flexor B 2x30 seconds SLR + ER 2x10 B STS 2 inch box under opposite LE 1x10 B   05/31/2022 Therex: Nustep lvl 5 8 mins LE only  Leg press double leg 112 lbs x 15, single leg 50 lbs 2 x 10 bilateral 56 lbs Incline calf stretch 30 sec x 3 bilateral Step on over and down 4 inch step x 15 bilateral Lateral step down 4 inch x 10 bilateral Seated SLR 2 x 10 bilaterally  Knee extension machine double leg up, single lower eccentrically 2 x 10 10 lbs    05/25/2022 HEP creation and review with demonstration and trial set preformed, see below for details Nu step LE only L5 X 6 min Partial lunges with UE support X 10 bilat, slow eccentrics Standing quad stretch with strap and UE support 3 X 30 sec on Rt Reviewed her gym program and home work out program    PATIENT EDUCATION: 05/31/2022 Education details: HEP update Person educated: Patient Education method: Explanation, Demonstration, Verbal cues, and Handouts Education comprehension: verbalized understanding and needs further education   HOME EXERCISE PROGRAM: Access Code: [email or web address] URL: [email or web address] Date: 05/25/2022 Prepared by: Ivery Quale  Exercises - Standing Quad Stretch with Strap  - 1 x daily - 6 x weekly - 1 sets - 3 reps - 30 sec hold - Standing Partial Lunge  - 1 x  daily - 6 x weekly - 2 sets - 10 reps - Step Up  - 1 x daily - 6 x weekly - 1 sets - 10 reps - Sit to Stand Without Arm Support  - 1 x daily - 6 x weekly - 1-2 sets - 10 reps - Full Leg Press  - 1 x daily - 6 x weekly - 2-3 sets - 10-20 reps - Single Leg Press  - 1 x daily - 6 x weekly - 2-3 sets - 10-20 reps - Single Leg Knee Extension with Weight Machine  - 1 x daily - 6 x weekly - 2-3 sets - 10-20 reps  ASSESSMENT:  CLINICAL IMPRESSION:  She is making improvements in her Rt knee strength. She was observed to have worse balance on Rt leg compared to Lt and we will work to improve this as well  OBJECTIVE IMPAIRMENTS: decreased activity tolerance, difficulty walking, decreased balance, decreased endurance, decreased mobility, decreased ROM, decreased strength, impaired flexibility, impaired LE use, postural dysfunction, and pain.  ACTIVITY LIMITATIONS: bending, lifting, carry, locomotion, cleaning, community activity, driving, and or occupation  PERSONAL FACTORS: PMH: Rt TKA 03/04/19, Ca,lumbar DDD, osteopenia, are also affecting patient's functional outcome.  REHAB POTENTIAL: Good  CLINICAL DECISION MAKING: Stable/uncomplicated  EVALUATION COMPLEXITY: Low    GOALS: Short term PT Goals Target date: 06/22/2022   Pt will be I and compliant with HEP. Baseline:  Goal status: on going 05/31/2022 Pt will decrease pain by 25% overall Baseline: Goal status: on going 05/31/2022  Long term PT goals Target date:07/20/2022 Pt will improve  hip/knee strength to at least 5-/5 MMT to improve functional strength Baseline: Goal status: on going 05/31/2022 Pt will reduce pain to overall less than 2-3/10 with usual activity, walking, standing Baseline: Goal status: on going 05/31/2022 Pt will be able to ambulate community distances at least 1000 ft WNL gait pattern without complaints and navigate one flight of stairs reciprocal without complaints Baseline: Goal status: on going 05/31/2022  PLAN: PT  FREQUENCY:  1-3 times per week   PT DURATION: 8 weeks  PLANNED INTERVENTIONS (unless contraindicated): aquatic PT, Canalith repositioning, cryotherapy, Electrical stimulation, Iontophoresis with 4 mg/ml dexamethasome, Moist heat, traction, Ultrasound, gait training, Therapeutic exercise, balance training, neuromuscular re-education, patient/family education, prosthetic training, manual techniques, passive ROM, dry needling, taping, vasopnuematic device, vestibular, spinal manipulations, joint manipulations  PLAN FOR NEXT SESSION:   Progressive strengthening avoiding pain increase. Single leg balance  Ivery Quale, PT, DPT 06/06/22 1:00 PM

## 2022-06-09 ENCOUNTER — Ambulatory Visit: Payer: Medicare Other | Admitting: Physical Therapy

## 2022-06-09 ENCOUNTER — Encounter: Payer: Self-pay | Admitting: Physical Therapy

## 2022-06-09 DIAGNOSIS — R262 Difficulty in walking, not elsewhere classified: Secondary | ICD-10-CM | POA: Diagnosis not present

## 2022-06-09 DIAGNOSIS — G8929 Other chronic pain: Secondary | ICD-10-CM | POA: Diagnosis not present

## 2022-06-09 DIAGNOSIS — M6281 Muscle weakness (generalized): Secondary | ICD-10-CM

## 2022-06-09 DIAGNOSIS — M25561 Pain in right knee: Secondary | ICD-10-CM | POA: Diagnosis not present

## 2022-06-09 DIAGNOSIS — R600 Localized edema: Secondary | ICD-10-CM | POA: Diagnosis not present

## 2022-06-09 NOTE — Therapy (Signed)
OUTPATIENT PHYSICAL THERAPY TREATMENT   Patient Name: Kathy Cook MRN: [id number] DOB:Sep 17, 1945, 77 y.o., female Today's Date: 06/09/2022  END OF SESSION:  PT End of Session - 06/09/22 1255     Visit Number 5    Number of Visits 15    Date for PT Re-Evaluation 07/20/22    Authorization Type U[employer] MCR    Progress Note Due on Visit 10    PT Start Time 1255    PT Stop Time 1335    PT Time Calculation (min) 40 min    Activity Tolerance Patient tolerated treatment well    Behavior During Therapy WFL for tasks assessed/performed               Past Medical History:  Diagnosis Date   Allergy    Anxiety    Attention deficit disorder (ADD)    Cancer    ? basal/squam cell on chest, and forehead   Depression    Dry eyes    GERD (gastroesophageal reflux disease)    diet controlled, no meds   HLD (hyperlipidemia)    HSV infection    Lumbar degenerative disc disease    Ocular rosacea    Osteopenia    Personal history of radiation therapy    Past Surgical History:  Procedure Laterality Date   BREAST BIOPSY Left 05/28/2019   BREAST LUMPECTOMY Left 07/18/2019   Procedure: LEFT BREAST LUMPECTOMY;  Surgeon: Abigail Miyamoto, MD;  Location: [facility];  Service: General;  Laterality: Left;  LMA   COLONOSCOPY  2019   RE-EXCISION OF BREAST LUMPECTOMY Left 09/05/2019   Procedure: RE-EXCISION OF LEFT BREAST TUMOR;  Surgeon: Abigail Miyamoto, MD;  Location: [facility];  Service: General;  Laterality: Left;  LMA   SKIN BIOPSY     ?basal/squam cell on chest   TONSILLECTOMY AND ADENOIDECTOMY     TOTAL KNEE ARTHROPLASTY Right 03/04/2019   Procedure: RIGHT TOTAL KNEE ARTHROPLASTY;  Surgeon: Eldred Manges, MD;  Location: [facility];  Service: Orthopedics;  Laterality: Right;   UPPER GI ENDOSCOPY     Patient Active Problem List   Diagnosis Date Noted   Quadriceps weakness 05/17/2022   Acute anal fissure 05/17/2020   Phyllodes tumor of breast 08/07/2019   Ductal carcinoma  in situ (DCIS) of left breast 08/06/2019   Status post total hip replacement, right 03/05/2019   Arthritis of right knee 03/04/2019   Unilateral primary osteoarthritis, right knee 02/05/2019   Knee locking, right 01/09/2019   Metatarsal stress fracture, right, initial encounter 10/03/2016   DJD (degenerative joint disease) 06/09/2016   Labile hypertension 08/01/2014   Vitamin D deficiency 08/01/2014   Medication management 08/01/2014   Obesity 07/04/2014   Hyperlipemia 06/27/2013   Attention deficit disorder (ADD)    GERD (gastroesophageal reflux disease)    Allergy    Depression, major, in remission (HCC)    Anemia    Ocular rosacea    HSV infection    Osteopenia     PCP: Lucky Cowboy, MD   REFERRING PROVIDER: Eldred Manges, MD,  REFERRING DIAG:  [phone] (ICD-10-CM) - Chronic pain of right knee  M62.81 (ICD-10-CM) - Quadriceps weakness    THERAPY DIAG:  Chronic pain of right knee  Muscle weakness (generalized)  Difficulty in walking, not elsewhere classified  Localized edema  Rationale for Evaluation and Treatment: Rehabilitation  ONSET DATE: Rt TKA 03/04/19 with residual quad weakness  SUBJECTIVE:   SUBJECTIVE STATEMENT: She relays her knee has been feeling better,  she feels she has turned a corner with PT  PERTINENT HISTORY:PMH: Rt TKA 03/04/19, Ca,lumbar DDD, osteopenia,  PAIN:  NPRS scale: no pain 0/10 Pain location:  Pain description:  Aggravating factors:  Relieving factors:   PRECAUTIONS: None  WEIGHT BEARING RESTRICTIONS: No  FALLS:  Has patient fallen in last 6 months? No  OCCUPATION:   PLOF: Independent  PATIENT GOALS: improve strength and walk longer as she has trip coming up to [place] end of May.   NEXT MD VISIT: nothing scheduled  OBJECTIVE:   DIAGNOSTIC FINDINGS:  05/25/2022 Result Date: 05/17/2022 Standing AP both knees lateral right knee and sunrise patella x-ray demonstrates satisfactory right total knee arthroplasty  without loosening or subsidence.  Opposite left knee shows mild arthritic changes. Impression: Satisfactory right total knee arthroplasty unchanged from January 2021 images.   PATIENT SURVEYS:  05/25/2022 FOTO wasn't set up for her so will omit  COGNITION: 05/25/2022 Overall cognitive status: Within functional limits for tasks assessed     SENSATION: 05/25/2022 She reports light sensation still diminished some around Rt knee  EDEMA:  05/25/2022 Eval: mild edema in Rt knee  MUSCLE LENGTH: 05/25/2022 Tight quads on Rt  POSTURE:   PALPATION:   LOWER EXTREMITY ROM:  Active ROM/PROM Right 05/25/2022 Left 05/25/2022  Hip flexion    Hip extension    Hip abduction    Hip adduction    Hip internal rotation    Hip external rotation    Knee flexion 115/120   Knee extension 0   Ankle dorsiflexion    Ankle plantarflexion    Ankle inversion    Ankle eversion     (Blank rows = not tested)  LOWER EXTREMITY MMT:  MMT Right 05/25/2022 Left 05/25/2022  Hip flexion 4 4+  Hip extension    Hip abduction 4 4+  Hip adduction    Hip internal rotation    Hip external rotation    Knee flexion 5 5  Knee extension 4 43# 4 39#  Ankle dorsiflexion    Ankle plantarflexion    Ankle inversion    Ankle eversion     (Blank rows = not tested)  LOWER EXTREMITY SPECIAL TESTS:    FUNCTIONAL TESTS:  05/25/2022: 5 times sit to stand: 8.9 seconds without UE support SLS avg of 6 seconds bilat  GAIT:Eval Comments: WFL gait, independent community Ambulator but does have pain with prolonged walking  TODAY'S TREATMENT:  06/06/22  TherEx  Nustep L6 x 8 minutes UE/LE  Shuttle LE press power up/slow down 125# 2 X 10 reps BLEs Single LE press power up/slow down 125# x10 B (wanted to try this on her own accord) Leg extension machine Rt leg only 10# 2X10 then DL 16# 1W96 Leg curl machine DL 04# 5W09 Sidestepping with with green band around ankles and squat each rep at counter top 2 round  trips Monster walking with with green band around ankles and squat each rep at counter top 2 round trips Seated SLR with 2.5# 2X10 each side Tandem walk 20 feet X 4 reps with supervision Single leg stance 20 sec X 3 bilat Sit to stands no UE support X 10, Rt foot slightly further back     06/02/22  TherEx  Nustep L5 x 8 minutes BLEs only  Shuttle LE press power up/slow down 125# x12 reps BLEs Single LE press power up/slow down 62# x12 B  Doorway calf stretch 2x30 seconds B Supine quad stretch B 2x30 seconds B  Supine hip flexor  B 2x30 seconds SLR + ER 2x10 B STS 2 inch box under opposite LE 1x10 B   05/31/2022 Therex: Nustep lvl 5 8 mins LE only  Leg press double leg 112 lbs x 15, single leg 50 lbs 2 x 10 bilateral 56 lbs Incline calf stretch 30 sec x 3 bilateral Step on over and down 4 inch step x 15 bilateral Lateral step down 4 inch x 10 bilateral Seated SLR 2 x 10 bilaterally  Knee extension machine double leg up, single lower eccentrically 2 x 10 10 lbs    05/25/2022 HEP creation and review with demonstration and trial set preformed, see below for details Nu step LE only L5 X 6 min Partial lunges with UE support X 10 bilat, slow eccentrics Standing quad stretch with strap and UE support 3 X 30 sec on Rt Reviewed her gym program and home work out program    PATIENT EDUCATION: 05/31/2022 Education details: HEP update Person educated: Patient Education method: Explanation, Demonstration, Verbal cues, and Handouts Education comprehension: verbalized understanding and needs further education   HOME EXERCISE PROGRAM: Access Code: [email or web address] URL: [email or web address] Date: 05/25/2022 Prepared by: Ivery QualeBrian Hallie Ertl  Exercises - Standing Quad Stretch with Strap  - 1 x daily - 6 x weekly - 1 sets - 3 reps - 30 sec hold - Standing Partial Lunge  - 1 x daily - 6 x weekly - 2 sets - 10 reps - Step Up  - 1 x daily - 6 x weekly - 1 sets - 10 reps - Sit to Stand  Without Arm Support  - 1 x daily - 6 x weekly - 1-2 sets - 10 reps - Full Leg Press  - 1 x daily - 6 x weekly - 2-3 sets - 10-20 reps - Single Leg Press  - 1 x daily - 6 x weekly - 2-3 sets - 10-20 reps - Single Leg Knee Extension with Weight Machine  - 1 x daily - 6 x weekly - 2-3 sets - 10-20 reps  ASSESSMENT:  CLINICAL IMPRESSION:  She showed some improvement in balance today but as she gets tired she starts to have more difficulty. She had good tolerance to strength progressions today and overall is progressing well with PT.  OBJECTIVE IMPAIRMENTS: decreased activity tolerance, difficulty walking, decreased balance, decreased endurance, decreased mobility, decreased ROM, decreased strength, impaired flexibility, impaired LE use, postural dysfunction, and pain.  ACTIVITY LIMITATIONS: bending, lifting, carry, locomotion, cleaning, community activity, driving, and or occupation  PERSONAL FACTORS: PMH: Rt TKA 03/04/19, Ca,lumbar DDD, osteopenia, are also affecting patient's functional outcome.  REHAB POTENTIAL: Good  CLINICAL DECISION MAKING: Stable/uncomplicated  EVALUATION COMPLEXITY: Low    GOALS: Short term PT Goals Target date: 06/22/2022   Pt will be I and compliant with HEP. Baseline:  Goal status: on going 05/31/2022 Pt will decrease pain by 25% overall Baseline: Goal status: on going 05/31/2022  Long term PT goals Target date:07/20/2022 Pt will improve  hip/knee strength to at least 5-/5 MMT to improve functional strength Baseline: Goal status: on going 05/31/2022 Pt will reduce pain to overall less than 2-3/10 with usual activity, walking, standing Baseline: Goal status: on going 05/31/2022 Pt will be able to ambulate community distances at least 1000 ft WNL gait pattern without complaints and navigate one flight of stairs reciprocal without complaints Baseline: Goal status: on going 05/31/2022  PLAN: PT FREQUENCY: 1-3 times per week   PT DURATION: 8 weeks  PLANNED  INTERVENTIONS (unless contraindicated): aquatic PT, Canalith  repositioning, cryotherapy, Electrical stimulation, Iontophoresis with 4 mg/ml dexamethasome, Moist heat, traction, Ultrasound, gait training, Therapeutic exercise, balance training, neuromuscular re-education, patient/family education, prosthetic training, manual techniques, passive ROM, dry needling, taping, vasopnuematic device, vestibular, spinal manipulations, joint manipulations  PLAN FOR NEXT SESSION:   Progressive strengthening avoiding pain increase. Single leg balance  Ivery Quale, PT, DPT 06/09/22 12:57 PM

## 2022-06-13 ENCOUNTER — Encounter: Payer: Self-pay | Admitting: Physical Therapy

## 2022-06-13 ENCOUNTER — Other Ambulatory Visit: Payer: Self-pay | Admitting: Internal Medicine

## 2022-06-13 ENCOUNTER — Ambulatory Visit: Payer: Medicare Other | Admitting: Physical Therapy

## 2022-06-13 DIAGNOSIS — M25561 Pain in right knee: Secondary | ICD-10-CM | POA: Diagnosis not present

## 2022-06-13 DIAGNOSIS — G8929 Other chronic pain: Secondary | ICD-10-CM | POA: Diagnosis not present

## 2022-06-13 DIAGNOSIS — M6281 Muscle weakness (generalized): Secondary | ICD-10-CM

## 2022-06-13 DIAGNOSIS — R600 Localized edema: Secondary | ICD-10-CM | POA: Diagnosis not present

## 2022-06-13 DIAGNOSIS — R262 Difficulty in walking, not elsewhere classified: Secondary | ICD-10-CM

## 2022-06-13 DIAGNOSIS — F988 Other specified behavioral and emotional disorders with onset usually occurring in childhood and adolescence: Secondary | ICD-10-CM

## 2022-06-13 MED ORDER — AMPHETAMINE-DEXTROAMPHETAMINE 30 MG PO TABS
ORAL_TABLET | ORAL | 0 refills | Status: DC
Start: 2022-06-13 — End: 2022-10-06

## 2022-06-13 NOTE — Therapy (Signed)
OUTPATIENT PHYSICAL THERAPY TREATMENT   Patient Name: Kathy Cook MRN: [id number] DOB:1945/06/09, 77 y.o., female Today's Date: 06/13/2022  END OF SESSION:  PT End of Session - 06/13/22 1254     Visit Number 6    Number of Visits 15    Date for PT Re-Evaluation 07/20/22    Authorization Type UHC MCR    Progress Note Due on Visit 10    PT Start Time 1257    PT Stop Time 1340    PT Time Calculation (min) 43 min    Activity Tolerance Patient tolerated treatment well    Behavior During Therapy WFL for tasks assessed/performed               Past Medical History:  Diagnosis Date   Allergy    Anxiety    Attention deficit disorder (ADD)    Cancer    ? basal/squam cell on chest, and forehead   Depression    Dry eyes    GERD (gastroesophageal reflux disease)    diet controlled, no meds   HLD (hyperlipidemia)    HSV infection    Lumbar degenerative disc disease    Ocular rosacea    Osteopenia    Personal history of radiation therapy    Past Surgical History:  Procedure Laterality Date   BREAST BIOPSY Left 05/28/2019   BREAST LUMPECTOMY Left 07/18/2019   Procedure: LEFT BREAST LUMPECTOMY;  Surgeon: Abigail Miyamoto, MD;  Location: [facility];  Service: General;  Laterality: Left;  LMA   COLONOSCOPY  2019   RE-EXCISION OF BREAST LUMPECTOMY Left 09/05/2019   Procedure: RE-EXCISION OF LEFT BREAST TUMOR;  Surgeon: Abigail Miyamoto, MD;  Location: [facility];  Service: General;  Laterality: Left;  LMA   SKIN BIOPSY     ?basal/squam cell on chest   TONSILLECTOMY AND ADENOIDECTOMY     TOTAL KNEE ARTHROPLASTY Right 03/04/2019   Procedure: RIGHT TOTAL KNEE ARTHROPLASTY;  Surgeon: Eldred Manges, MD;  Location: [facility];  Service: Orthopedics;  Laterality: Right;   UPPER GI ENDOSCOPY     Patient Active Problem List   Diagnosis Date Noted   Quadriceps weakness 05/17/2022   Acute anal fissure 05/17/2020   Phyllodes tumor of breast 08/07/2019   Ductal carcinoma  in situ (DCIS) of left breast 08/06/2019   Status post total hip replacement, right 03/05/2019   Arthritis of right knee 03/04/2019   Unilateral primary osteoarthritis, right knee 02/05/2019   Knee locking, right 01/09/2019   Metatarsal stress fracture, right, initial encounter 10/03/2016   DJD (degenerative joint disease) 06/09/2016   Labile hypertension 08/01/2014   Vitamin D deficiency 08/01/2014   Medication management 08/01/2014   Obesity 07/04/2014   Hyperlipemia 06/27/2013   Attention deficit disorder (ADD)    GERD (gastroesophageal reflux disease)    Allergy    Depression, major, in remission (HCC)    Anemia    Ocular rosacea    HSV infection    Osteopenia     PCP: Lucky Cowboy, MD   REFERRING PROVIDER: Eldred Manges, MD,  REFERRING DIAG:  [phone] (ICD-10-CM) - Chronic pain of right knee  M62.81 (ICD-10-CM) - Quadriceps weakness    THERAPY DIAG:  Chronic pain of right knee  Muscle weakness (generalized)  Difficulty in walking, not elsewhere classified  Localized edema  Rationale for Evaluation and Treatment: Rehabilitation  ONSET DATE: Rt TKA 03/04/19 with residual quad weakness  SUBJECTIVE:   SUBJECTIVE STATEMENT: Her knee is doing okay but has more  back pain after walking a lot at [employer]  PERTINENT HISTORY:PMH: Rt TKA 03/04/19, Ca,lumbar DDD, osteopenia,  PAIN:  NPRS scale: no pain 0/10, complaints of back pain today Pain location:  Pain description:  Aggravating factors:  Relieving factors:   PRECAUTIONS: None  WEIGHT BEARING RESTRICTIONS: No  FALLS:  Has patient fallen in last 6 months? No  OCCUPATION:   PLOF: Independent  PATIENT GOALS: improve strength and walk longer as she has trip coming up to [place] end of May.   NEXT MD VISIT: nothing scheduled  OBJECTIVE:   DIAGNOSTIC FINDINGS:  05/25/2022 Result Date: 05/17/2022 Standing AP both knees lateral right knee and sunrise patella x-ray demonstrates satisfactory  right total knee arthroplasty without loosening or subsidence.  Opposite left knee shows mild arthritic changes. Impression: Satisfactory right total knee arthroplasty unchanged from January 2021 images.   PATIENT SURVEYS:  05/25/2022 FOTO wasn't set up for her so will omit  COGNITION: 05/25/2022 Overall cognitive status: Within functional limits for tasks assessed     SENSATION: 05/25/2022 She reports light sensation still diminished some around Rt knee  EDEMA:  05/25/2022 Eval: mild edema in Rt knee  MUSCLE LENGTH: 05/25/2022 Tight quads on Rt  POSTURE:   PALPATION:   LOWER EXTREMITY ROM:  Active ROM/PROM Right 05/25/2022 Left 05/25/2022  Hip flexion    Hip extension    Hip abduction    Hip adduction    Hip internal rotation    Hip external rotation    Knee flexion 115/120   Knee extension 0   Ankle dorsiflexion    Ankle plantarflexion    Ankle inversion    Ankle eversion     (Blank rows = not tested)  LOWER EXTREMITY MMT:  MMT Right 05/25/2022 Left 05/25/2022  Hip flexion 4 4+  Hip extension    Hip abduction 4 4+  Hip adduction    Hip internal rotation    Hip external rotation    Knee flexion 5 5  Knee extension 4 43# 4 39#  Ankle dorsiflexion    Ankle plantarflexion    Ankle inversion    Ankle eversion     (Blank rows = not tested)  LOWER EXTREMITY SPECIAL TESTS:    FUNCTIONAL TESTS:  05/25/2022: 5 times sit to stand: 8.9 seconds without UE support SLS avg of 6 seconds bilat  GAIT:Eval Comments: WFL gait, independent community Ambulator but does have pain with prolonged walking  TODAY'S TREATMENT:  06/13/22  TherEx  Nustep L6 x 8 minutes UE/LE  Shuttle LE press power up/slow down 150# 2 X 10 reps BLEs Single LE press power up/slow down 125# 2 x10 B  Leg extension machine Rt leg only 10# 2X10 then DL 60# 4V40 Leg curl machine DL 98# 1X91 Step ups fwd on 6 inch step X 15 bilat Step ups lateral on 6 inch step X 10 bilat Sidestepping  with with green band around ankles and squat each rep at counter top 2 round trips Monster walking with with green band around ankles and squat each rep at counter top 4 round trips at bars Seated SLR with 2.5# 2X10 each side Tandem walk 20 feet X 4 reps with supervision Single leg stance 20 sec X 2 bilat Sit to stands no UE support 2 X 10, Rt foot slightly further back   06/06/22  TherEx  Nustep L6 x 8 minutes UE/LE  Shuttle LE press power up/slow down 125# 2 X 10 reps BLEs Single LE press power up/slow down  125# x10 B  Leg extension machine Rt leg only 10# 2X10 then DL 95# 1O84 Leg curl machine DL 16# 6A63 Sidestepping with with green band around ankles and squat each rep at counter top 2 round trips Monster walking with with green band around ankles and squat each rep at counter top 2 round trips Seated SLR with 2.5# 2X10 each side Tandem walk 20 feet X 4 reps with supervision Single leg stance 20 sec X 3 bilat Sit to stands no UE support X 10, Rt foot slightly further back     06/02/22  TherEx  Nustep L5 x 8 minutes BLEs only  Shuttle LE press power up/slow down 125# x12 reps BLEs Single LE press power up/slow down 62# x12 B  Doorway calf stretch 2x30 seconds B Supine quad stretch B 2x30 seconds B  Supine hip flexor B 2x30 seconds SLR + ER 2x10 B STS 2 inch box under opposite LE 1x10 B   05/31/2022 Therex: Nustep lvl 5 8 mins LE only  Leg press double leg 112 lbs x 15, single leg 50 lbs 2 x 10 bilateral 56 lbs Incline calf stretch 30 sec x 3 bilateral Step on over and down 4 inch step x 15 bilateral Lateral step down 4 inch x 10 bilateral Seated SLR 2 x 10 bilaterally  Knee extension machine double leg up, single lower eccentrically 2 x 10 10 lbs    05/25/2022 HEP creation and review with demonstration and trial set preformed, see below for details Nu step LE only L5 X 6 min Partial lunges with UE support X 10 bilat, slow eccentrics Standing quad stretch with strap  and UE support 3 X 30 sec on Rt Reviewed her gym program and home work out program    PATIENT EDUCATION: 05/31/2022 Education details: HEP update Person educated: Patient Education method: Explanation, Demonstration, Verbal cues, and Handouts Education comprehension: verbalized understanding and needs further education   HOME EXERCISE PROGRAM: Access Code: [email or web address] URL: [email or web address] Date: 05/25/2022 Prepared by: Ivery Quale  Exercises - Standing Quad Stretch with Strap  - 1 x daily - 6 x weekly - 1 sets - 3 reps - 30 sec hold - Standing Partial Lunge  - 1 x daily - 6 x weekly - 2 sets - 10 reps - Step Up  - 1 x daily - 6 x weekly - 1 sets - 10 reps - Sit to Stand Without Arm Support  - 1 x daily - 6 x weekly - 1-2 sets - 10 reps - Full Leg Press  - 1 x daily - 6 x weekly - 2-3 sets - 10-20 reps - Single Leg Press  - 1 x daily - 6 x weekly - 2-3 sets - 10-20 reps - Single Leg Knee Extension with Weight Machine  - 1 x daily - 6 x weekly - 2-3 sets - 10-20 reps  ASSESSMENT:  CLINICAL IMPRESSION:  She had some increased complaints of back pain upon arrival but despite this had good overall tolerance to leg strengthening program and is making progress in this area.   OBJECTIVE IMPAIRMENTS: decreased activity tolerance, difficulty walking, decreased balance, decreased endurance, decreased mobility, decreased ROM, decreased strength, impaired flexibility, impaired LE use, postural dysfunction, and pain.  ACTIVITY LIMITATIONS: bending, lifting, carry, locomotion, cleaning, community activity, driving, and or occupation  PERSONAL FACTORS: PMH: Rt TKA 03/04/19, Ca,lumbar DDD, osteopenia, are also affecting patient's functional outcome.  REHAB POTENTIAL: Good  CLINICAL DECISION MAKING: Stable/uncomplicated  EVALUATION COMPLEXITY: Low  GOALS: Short term PT Goals Target date: 06/22/2022   Pt will be I and compliant with HEP. Baseline:  Goal status: on  going 05/31/2022 Pt will decrease pain by 25% overall Baseline: Goal status: on going 05/31/2022  Long term PT goals Target date:07/20/2022 Pt will improve  hip/knee strength to at least 5-/5 MMT to improve functional strength Baseline: Goal status: on going 05/31/2022 Pt will reduce pain to overall less than 2-3/10 with usual activity, walking, standing Baseline: Goal status: on going 05/31/2022 Pt will be able to ambulate community distances at least 1000 ft WNL gait pattern without complaints and navigate one flight of stairs reciprocal without complaints Baseline: Goal status: on going 05/31/2022  PLAN: PT FREQUENCY: 1-3 times per week   PT DURATION: 8 weeks  PLANNED INTERVENTIONS (unless contraindicated): aquatic PT, Canalith repositioning, cryotherapy, Electrical stimulation, Iontophoresis with 4 mg/ml dexamethasome, Moist heat, traction, Ultrasound, gait training, Therapeutic exercise, balance training, neuromuscular re-education, patient/family education, prosthetic training, manual techniques, passive ROM, dry needling, taping, vasopnuematic device, vestibular, spinal manipulations, joint manipulations  PLAN FOR NEXT SESSION:   Progressive strengthening avoiding pain increase. Single leg balance  Ivery Quale, PT, DPT 06/13/22 1:10 PM

## 2022-06-16 ENCOUNTER — Encounter: Payer: Self-pay | Admitting: Physical Therapy

## 2022-06-16 ENCOUNTER — Ambulatory Visit: Payer: Medicare Other | Admitting: Physical Therapy

## 2022-06-16 DIAGNOSIS — R262 Difficulty in walking, not elsewhere classified: Secondary | ICD-10-CM | POA: Diagnosis not present

## 2022-06-16 DIAGNOSIS — R600 Localized edema: Secondary | ICD-10-CM

## 2022-06-16 DIAGNOSIS — M25561 Pain in right knee: Secondary | ICD-10-CM

## 2022-06-16 DIAGNOSIS — M6281 Muscle weakness (generalized): Secondary | ICD-10-CM | POA: Diagnosis not present

## 2022-06-16 DIAGNOSIS — G8929 Other chronic pain: Secondary | ICD-10-CM

## 2022-06-16 NOTE — Therapy (Signed)
OUTPATIENT PHYSICAL THERAPY TREATMENT   Patient Name: Kathy Cook MRN: [id number] DOB:02/20/1946, 77 y.o., female Today's Date: 06/16/2022  END OF SESSION:  PT End of Session - 06/16/22 1356     Visit Number 7    Number of Visits 15    Date for PT Re-Evaluation 07/20/22    Authorization Type U[employer] MCR    Progress Note Due on Visit 10    PT Start Time 1350    PT Stop Time 1430    PT Time Calculation (min) 40 min    Activity Tolerance Patient tolerated treatment well    Behavior During Therapy WFL for tasks assessed/performed               Past Medical History:  Diagnosis Date   Allergy    Anxiety    Attention deficit disorder (ADD)    Cancer    ? basal/squam cell on chest, and forehead   Depression    Dry eyes    GERD (gastroesophageal reflux disease)    diet controlled, no meds   HLD (hyperlipidemia)    HSV infection    Lumbar degenerative disc disease    Ocular rosacea    Osteopenia    Personal history of radiation therapy    Past Surgical History:  Procedure Laterality Date   BREAST BIOPSY Left 05/28/2019   BREAST LUMPECTOMY Left 07/18/2019   Procedure: LEFT BREAST LUMPECTOMY;  Surgeon: Abigail Miyamoto, MD;  Location: [facility];  Service: General;  Laterality: Left;  LMA   COLONOSCOPY  2019   RE-EXCISION OF BREAST LUMPECTOMY Left 09/05/2019   Procedure: RE-EXCISION OF LEFT BREAST TUMOR;  Surgeon: Abigail Miyamoto, MD;  Location: [facility];  Service: General;  Laterality: Left;  LMA   SKIN BIOPSY     ?basal/squam cell on chest   TONSILLECTOMY AND ADENOIDECTOMY     TOTAL KNEE ARTHROPLASTY Right 03/04/2019   Procedure: RIGHT TOTAL KNEE ARTHROPLASTY;  Surgeon: Eldred Manges, MD;  Location: [facility];  Service: Orthopedics;  Laterality: Right;   UPPER GI ENDOSCOPY     Patient Active Problem List   Diagnosis Date Noted   Quadriceps weakness 05/17/2022   Acute anal fissure 05/17/2020   Phyllodes tumor of breast 08/07/2019   Ductal carcinoma  in situ (DCIS) of left breast 08/06/2019   Status post total hip replacement, right 03/05/2019   Arthritis of right knee 03/04/2019   Unilateral primary osteoarthritis, right knee 02/05/2019   Knee locking, right 01/09/2019   Metatarsal stress fracture, right, initial encounter 10/03/2016   DJD (degenerative joint disease) 06/09/2016   Labile hypertension 08/01/2014   Vitamin D deficiency 08/01/2014   Medication management 08/01/2014   Obesity 07/04/2014   Hyperlipemia 06/27/2013   Attention deficit disorder (ADD)    GERD (gastroesophageal reflux disease)    Allergy    Depression, major, in remission (HCC)    Anemia    Ocular rosacea    HSV infection    Osteopenia     PCP: Lucky Cowboy, MD   REFERRING PROVIDER: Eldred Manges, MD,  REFERRING DIAG:  [phone] (ICD-10-CM) - Chronic pain of right knee  M62.81 (ICD-10-CM) - Quadriceps weakness    THERAPY DIAG:  Chronic pain of right knee  Muscle weakness (generalized)  Difficulty in walking, not elsewhere classified  Localized edema  Rationale for Evaluation and Treatment: Rehabilitation  ONSET DATE: Rt TKA 03/04/19 with residual quad weakness  SUBJECTIVE:   SUBJECTIVE STATEMENT: Denies knee pain, her back pain is also  improving  PERTINENT HISTORY:PMH: Rt TKA 03/04/19, Ca,lumbar DDD, osteopenia,  PAIN:  NPRS scale: no pain 0/10, less complaints of back pain today Pain location:  Pain description:  Aggravating factors:  Relieving factors:   PRECAUTIONS: None  WEIGHT BEARING RESTRICTIONS: No  FALLS:  Has patient fallen in last 6 months? No  OCCUPATION:   PLOF: Independent  PATIENT GOALS: improve strength and walk longer as she has trip coming up to [place] end of May.   NEXT MD VISIT: nothing scheduled  OBJECTIVE:   DIAGNOSTIC FINDINGS:  05/25/2022 Result Date: 05/17/2022 Standing AP both knees lateral right knee and sunrise patella x-ray demonstrates satisfactory right total knee arthroplasty  without loosening or subsidence.  Opposite left knee shows mild arthritic changes. Impression: Satisfactory right total knee arthroplasty unchanged from January 2021 images.   PATIENT SURVEYS:  05/25/2022 FOTO wasn't set up for her so will omit  COGNITION: 05/25/2022 Overall cognitive status: Within functional limits for tasks assessed     SENSATION: 05/25/2022 She reports light sensation still diminished some around Rt knee  EDEMA:  05/25/2022 Eval: mild edema in Rt knee  MUSCLE LENGTH: 05/25/2022 Tight quads on Rt  POSTURE:   PALPATION:   LOWER EXTREMITY ROM:  Active ROM/PROM Right 05/25/2022 Left 05/25/2022  Hip flexion    Hip extension    Hip abduction    Hip adduction    Hip internal rotation    Hip external rotation    Knee flexion 115/120   Knee extension 0   Ankle dorsiflexion    Ankle plantarflexion    Ankle inversion    Ankle eversion     (Blank rows = not tested)  LOWER EXTREMITY MMT:  MMT Right 05/25/2022 Left 05/25/2022  Hip flexion 4 4+  Hip extension    Hip abduction 4 4+  Hip adduction    Hip internal rotation    Hip external rotation    Knee flexion 5 5  Knee extension 4 43# 4 39#  Ankle dorsiflexion    Ankle plantarflexion    Ankle inversion    Ankle eversion     (Blank rows = not tested)  LOWER EXTREMITY SPECIAL TESTS:    FUNCTIONAL TESTS:  05/25/2022: 5 times sit to stand: 8.9 seconds without UE support SLS avg of 6 seconds bilat  GAIT:Eval Comments: WFL gait, independent community Ambulator but does have pain with prolonged walking  TODAY'S TREATMENT:  06/16/22  TherEx  Nustep L6 x 8 minutes UE/LE  Shuttle LE press power up/slow down 150# 3 X 10 reps BLEs Single LE press power up/slow down 100# 2 x10 B  Leg extension machine Rt leg only 10# 2X10 then DL 16# 1W96 Leg curl machine DL 04# 5W09 Step ups fwd on 6 inch step X 10 up with Rt and down in front with left Step ups lateral on 6 inch step X 10 bilat Sidestepping  with with green band around ankles and squat each rep at counter top 2 round trips Monster walking with with green band around ankles and squat each rep at counter top 4 round trips at bars Seated SLR with 3# 2X10 each side Tandem walk 20 feet X 4 reps with supervision Single leg stance 20 sec X 3 bilat Sit to stands no UE support 2 X 10, Rt foot slightly further back, 10# weight   06/13/22  TherEx  Nustep L6 x 8 minutes UE/LE  Shuttle LE press power up/slow down 150# 2 X 10 reps BLEs Single LE press  power up/slow down 125# 2 x10 B  Leg extension machine Rt leg only 10# 2X10 then DL 01# 7B93 Leg curl machine DL 90# 3E09 Step ups fwd on 6 inch step X 15 bilat Step ups lateral on 6 inch step X 10 bilat Sidestepping with with green band around ankles and squat each rep at counter top 2 round trips Monster walking with with green band around ankles and squat each rep at counter top 4 round trips at bars Seated SLR with 2.5# 2X10 each side Tandem walk 20 feet X 4 reps with supervision Single leg stance 20 sec X 2 bilat Sit to stands no UE support 2 X 10, Rt foot slightly further back   06/06/22  TherEx  Nustep L6 x 8 minutes UE/LE  Shuttle LE press power up/slow down 125# 2 X 10 reps BLEs Single LE press power up/slow down 125# x10 B  Leg extension machine Rt leg only 10# 2X10 then DL 23# 3A07 Leg curl machine DL 62# 2Q33 Sidestepping with with green band around ankles and squat each rep at counter top 2 round trips Monster walking with with green band around ankles and squat each rep at counter top 2 round trips Seated SLR with 2.5# 2X10 each side Tandem walk 20 feet X 4 reps with supervision Single leg stance 20 sec X 3 bilat Sit to stands no UE support X 10, Rt foot slightly further back      PATIENT EDUCATION: 05/31/2022 Education details: HEP update Person educated: Patient Education method: Programmer, multimedia, Demonstration, Verbal cues, and Handouts Education comprehension:  verbalized understanding and needs further education   HOME EXERCISE PROGRAM: Access Code: [email or web address] URL: [email or web address] Date: 05/25/2022 Prepared by: Ivery Quale  Exercises - Standing Quad Stretch with Strap  - 1 x daily - 6 x weekly - 1 sets - 3 reps - 30 sec hold - Standing Partial Lunge  - 1 x daily - 6 x weekly - 2 sets - 10 reps - Step Up  - 1 x daily - 6 x weekly - 1 sets - 10 reps - Sit to Stand Without Arm Support  - 1 x daily - 6 x weekly - 1-2 sets - 10 reps - Full Leg Press  - 1 x daily - 6 x weekly - 2-3 sets - 10-20 reps - Single Leg Press  - 1 x daily - 6 x weekly - 2-3 sets - 10-20 reps - Single Leg Knee Extension with Weight Machine  - 1 x daily - 6 x weekly - 2-3 sets - 10-20 reps  ASSESSMENT:  CLINICAL IMPRESSION:  She had good tolerance to exercise progressions today focused on increased quad strengthening. Her pain is overall improving. Continue current POC.  OBJECTIVE IMPAIRMENTS: decreased activity tolerance, difficulty walking, decreased balance, decreased endurance, decreased mobility, decreased ROM, decreased strength, impaired flexibility, impaired LE use, postural dysfunction, and pain.  ACTIVITY LIMITATIONS: bending, lifting, carry, locomotion, cleaning, community activity, driving, and or occupation  PERSONAL FACTORS: PMH: Rt TKA 03/04/19, Ca,lumbar DDD, osteopenia, are also affecting patient's functional outcome.  REHAB POTENTIAL: Good  CLINICAL DECISION MAKING: Stable/uncomplicated  EVALUATION COMPLEXITY: Low    GOALS: Short term PT Goals Target date: 06/22/2022   Pt will be I and compliant with HEP. Baseline:  Goal status: on going 05/31/2022 Pt will decrease pain by 25% overall Baseline: Goal status: on going 05/31/2022  Long term PT goals Target date:07/20/2022 Pt will improve  hip/knee strength to at least 5-/5 MMT to improve functional  strength Baseline: Goal status: on going 05/31/2022 Pt will reduce pain to overall  less than 2-3/10 with usual activity, walking, standing Baseline: Goal status: on going 05/31/2022 Pt will be able to ambulate community distances at least 1000 ft WNL gait pattern without complaints and navigate one flight of stairs reciprocal without complaints Baseline: Goal status: on going 05/31/2022  PLAN: PT FREQUENCY: 1-3 times per week   PT DURATION: 8 weeks  PLANNED INTERVENTIONS (unless contraindicated): aquatic PT, Canalith repositioning, cryotherapy, Electrical stimulation, Iontophoresis with 4 mg/ml dexamethasome, Moist heat, traction, Ultrasound, gait training, Therapeutic exercise, balance training, neuromuscular re-education, patient/family education, prosthetic training, manual techniques, passive ROM, dry needling, taping, vasopnuematic device, vestibular, spinal manipulations, joint manipulations  PLAN FOR NEXT SESSION:   Progressive strengthening avoiding pain increase. Single leg balance  Ivery Quale, PT, DPT 06/16/22 1:57 PM

## 2022-06-20 ENCOUNTER — Encounter: Payer: Self-pay | Admitting: Physical Therapy

## 2022-06-20 ENCOUNTER — Ambulatory Visit: Payer: Medicare Other | Admitting: Physical Therapy

## 2022-06-20 DIAGNOSIS — R600 Localized edema: Secondary | ICD-10-CM

## 2022-06-20 DIAGNOSIS — G8929 Other chronic pain: Secondary | ICD-10-CM | POA: Diagnosis not present

## 2022-06-20 DIAGNOSIS — M25561 Pain in right knee: Secondary | ICD-10-CM | POA: Diagnosis not present

## 2022-06-20 DIAGNOSIS — R262 Difficulty in walking, not elsewhere classified: Secondary | ICD-10-CM

## 2022-06-20 DIAGNOSIS — M6281 Muscle weakness (generalized): Secondary | ICD-10-CM | POA: Diagnosis not present

## 2022-06-20 NOTE — Therapy (Signed)
OUTPATIENT PHYSICAL THERAPY TREATMENT   Patient Name: Kathy Cook MRN: [id number] DOB:Jan 15, 1946, 77 y.o., female Today's Date: 06/20/2022  END OF SESSION:  PT End of Session - 06/20/22 1312     Visit Number 8    Number of Visits 15    Date for PT Re-Evaluation 07/20/22    Authorization Type UHC MCR    Progress Note Due on Visit 10    PT Start Time 1258    PT Stop Time 1336    PT Time Calculation (min) 38 min    Activity Tolerance Patient tolerated treatment well    Behavior During Therapy WFL for tasks assessed/performed                Past Medical History:  Diagnosis Date   Allergy    Anxiety    Attention deficit disorder (ADD)    Cancer    ? basal/squam cell on chest, and forehead   Depression    Dry eyes    GERD (gastroesophageal reflux disease)    diet controlled, no meds   HLD (hyperlipidemia)    HSV infection    Lumbar degenerative disc disease    Ocular rosacea    Osteopenia    Personal history of radiation therapy    Past Surgical History:  Procedure Laterality Date   BREAST BIOPSY Left 05/28/2019   BREAST LUMPECTOMY Left 07/18/2019   Procedure: LEFT BREAST LUMPECTOMY;  Surgeon: Abigail Miyamoto, MD;  Location: [facility];  Service: General;  Laterality: Left;  LMA   COLONOSCOPY  2019   RE-EXCISION OF BREAST LUMPECTOMY Left 09/05/2019   Procedure: RE-EXCISION OF LEFT BREAST TUMOR;  Surgeon: Abigail Miyamoto, MD;  Location: [facility];  Service: General;  Laterality: Left;  LMA   SKIN BIOPSY     ?basal/squam cell on chest   TONSILLECTOMY AND ADENOIDECTOMY     TOTAL KNEE ARTHROPLASTY Right 03/04/2019   Procedure: RIGHT TOTAL KNEE ARTHROPLASTY;  Surgeon: Eldred Manges, MD;  Location: [facility];  Service: Orthopedics;  Laterality: Right;   UPPER GI ENDOSCOPY     Patient Active Problem List   Diagnosis Date Noted   Quadriceps weakness 05/17/2022   Acute anal fissure 05/17/2020   Phyllodes tumor of breast 08/07/2019   Ductal  carcinoma in situ (DCIS) of left breast 08/06/2019   Status post total hip replacement, right 03/05/2019   Arthritis of right knee 03/04/2019   Unilateral primary osteoarthritis, right knee 02/05/2019   Knee locking, right 01/09/2019   Metatarsal stress fracture, right, initial encounter 10/03/2016   DJD (degenerative joint disease) 06/09/2016   Labile hypertension 08/01/2014   Vitamin D deficiency 08/01/2014   Medication management 08/01/2014   Obesity 07/04/2014   Hyperlipemia 06/27/2013   Attention deficit disorder (ADD)    GERD (gastroesophageal reflux disease)    Allergy    Depression, major, in remission (HCC)    Anemia    Ocular rosacea    HSV infection    Osteopenia     PCP: Lucky Cowboy, MD   REFERRING PROVIDER: Eldred Manges, MD,  REFERRING DIAG:  [phone] (ICD-10-CM) - Chronic pain of right knee  M62.81 (ICD-10-CM) - Quadriceps weakness    THERAPY DIAG:  Chronic pain of right knee  Muscle weakness (generalized)  Difficulty in walking, not elsewhere classified  Localized edema  Rationale for Evaluation and Treatment: Rehabilitation  ONSET DATE: Rt TKA 03/04/19 with residual quad weakness  SUBJECTIVE:   SUBJECTIVE STATEMENT: Denies knee pain in her knee, still  has some residual back pain, her chief complaint today is how tired she is after a busy weekend out of town.  PERTINENT HISTORY:PMH: Rt TKA 03/04/19, Ca,lumbar DDD, osteopenia,  PAIN:  NPRS scale: no pain 0/10, less complaints of back pain today Pain location:  Pain description:  Aggravating factors:  Relieving factors:   PRECAUTIONS: None  WEIGHT BEARING RESTRICTIONS: No  FALLS:  Has patient fallen in last 6 months? No  OCCUPATION:   PLOF: Independent  PATIENT GOALS: improve strength and walk longer as she has trip coming up to [place] end of May.   NEXT MD VISIT: nothing scheduled  OBJECTIVE:   DIAGNOSTIC FINDINGS:  05/25/2022 Result Date: 05/17/2022 Standing AP both  knees lateral right knee and sunrise patella x-ray demonstrates satisfactory right total knee arthroplasty without loosening or subsidence.  Opposite left knee shows mild arthritic changes. Impression: Satisfactory right total knee arthroplasty unchanged from January 2021 images.   PATIENT SURVEYS:  05/25/2022 FOTO wasn't set up for her so will omit  COGNITION: 05/25/2022 Overall cognitive status: Within functional limits for tasks assessed     SENSATION: 05/25/2022 She reports light sensation still diminished some around Rt knee  EDEMA:  05/25/2022 Eval: mild edema in Rt knee  MUSCLE LENGTH: 05/25/2022 Tight quads on Rt  POSTURE:   PALPATION:   LOWER EXTREMITY ROM:  Active ROM/PROM Right 05/25/2022 Left 05/25/2022  Hip flexion    Hip extension    Hip abduction    Hip adduction    Hip internal rotation    Hip external rotation    Knee flexion 115/120   Knee extension 0   Ankle dorsiflexion    Ankle plantarflexion    Ankle inversion    Ankle eversion     (Blank rows = not tested)  LOWER EXTREMITY MMT:  MMT Right 05/25/2022 Left 05/25/2022  Hip flexion 4 4+  Hip extension    Hip abduction 4 4+  Hip adduction    Hip internal rotation    Hip external rotation    Knee flexion 5 5  Knee extension 4 43# 4 39#  Ankle dorsiflexion    Ankle plantarflexion    Ankle inversion    Ankle eversion     (Blank rows = not tested)  LOWER EXTREMITY SPECIAL TESTS:    FUNCTIONAL TESTS:  05/25/2022: 5 times sit to stand: 8.9 seconds without UE support SLS avg of 6 seconds bilat  GAIT:Eval Comments: WFL gait, independent community Ambulator but does have pain with prolonged walking  TODAY'S TREATMENT:  06/20/22  TherEx  Nustep L6 x 8 minutes UE/LE  Shuttle LE press power up/slow down 150# 3 X 10 reps BLEs Single LE press power up/slow down 100# 2 x10 B  Leg extension machine Rt leg only 10# 2X10 then DL 13# 2G40 Leg curl machine DL 10# 2V25 TRX lunges X 10 bilat   Sidestepping with with green band around ankles and squat each rep at counter top 2 round trips Monster walking with with green band around ankles and squat each rep at counter top 4 round trips at bars Seated SLR with 3# 2X10 each side Tandem walk 20 feet X 4 reps with supervision Single leg stance 20 sec X 3 bilat Sit to stands no UE support 2 X 10, Rt foot slightly further back then one set of 10 with left leg slightly back  06/16/22  TherEx  Nustep L6 x 8 minutes UE/LE  Shuttle LE press power up/slow down 150# 3 X 10  reps BLEs Single LE press power up/slow down 100# 2 x10 B  Leg extension machine Rt leg only 10# 2X10 then DL 08# 6V78 Leg curl machine DL 46# 9G29 Step ups fwd on 6 inch step X 10 up with Rt and down in front with left Step ups lateral on 6 inch step X 10 bilat Sidestepping with with green band around ankles and squat each rep at counter top 2 round trips Monster walking with with green band around ankles and squat each rep at counter top 4 round trips at bars Seated SLR with 3# 2X10 each side Tandem walk 20 feet X 4 reps with supervision Single leg stance 20 sec X 3 bilat Sit to stands no UE support 2 X 10, Rt foot slightly further back, 10# weight   06/13/22  TherEx  Nustep L6 x 8 minutes UE/LE  Shuttle LE press power up/slow down 150# 2 X 10 reps BLEs Single LE press power up/slow down 125# 2 x10 B  Leg extension machine Rt leg only 10# 2X10 then DL 52# 8U13 Leg curl machine DL 24# 4W10 Step ups fwd on 6 inch step X 15 bilat Step ups lateral on 6 inch step X 10 bilat Sidestepping with with green band around ankles and squat each rep at counter top 2 round trips Monster walking with with green band around ankles and squat each rep at counter top 4 round trips at bars Seated SLR with 2.5# 2X10 each side Tandem walk 20 feet X 4 reps with supervision Single leg stance 20 sec X 2 bilat Sit to stands no UE support 2 X 10, Rt foot slightly further back    06/06/22  TherEx  Nustep L6 x 8 minutes UE/LE  Shuttle LE press power up/slow down 125# 2 X 10 reps BLEs Single LE press power up/slow down 125# x10 B  Leg extension machine Rt leg only 10# 2X10 then DL 27# 2Z36 Leg curl machine DL 64# 4I34 Sidestepping with with green band around ankles and squat each rep at counter top 2 round trips Monster walking with with green band around ankles and squat each rep at counter top 2 round trips Seated SLR with 2.5# 2X10 each side Tandem walk 20 feet X 4 reps with supervision Single leg stance 20 sec X 3 bilat Sit to stands no UE support X 10, Rt foot slightly further back      PATIENT EDUCATION: 05/31/2022 Education details: HEP update Person educated: Patient Education method: Programmer, multimedia, Demonstration, Verbal cues, and Handouts Education comprehension: verbalized understanding and needs further education   HOME EXERCISE PROGRAM: Access Code: [email or web address] URL: [email or web address] Date: 05/25/2022 Prepared by: Ivery Quale  Exercises - Standing Quad Stretch with Strap  - 1 x daily - 6 x weekly - 1 sets - 3 reps - 30 sec hold - Standing Partial Lunge  - 1 x daily - 6 x weekly - 2 sets - 10 reps - Step Up  - 1 x daily - 6 x weekly - 1 sets - 10 reps - Sit to Stand Without Arm Support  - 1 x daily - 6 x weekly - 1-2 sets - 10 reps - Full Leg Press  - 1 x daily - 6 x weekly - 2-3 sets - 10-20 reps - Single Leg Press  - 1 x daily - 6 x weekly - 2-3 sets - 10-20 reps - Single Leg Knee Extension with Weight Machine  - 1 x daily - 6 x weekly -  2-3 sets - 10-20 reps  ASSESSMENT:  CLINICAL IMPRESSION:  She had good pain tolerance today to strengthening program but was a little more limited by fatigue today. PT recommending to continue current plan.   OBJECTIVE IMPAIRMENTS: decreased activity tolerance, difficulty walking, decreased balance, decreased endurance, decreased mobility, decreased ROM, decreased strength, impaired  flexibility, impaired LE use, postural dysfunction, and pain.  ACTIVITY LIMITATIONS: bending, lifting, carry, locomotion, cleaning, community activity, driving, and or occupation  PERSONAL FACTORS: PMH: Rt TKA 03/04/19, Ca,lumbar DDD, osteopenia, are also affecting patient's functional outcome.  REHAB POTENTIAL: Good  CLINICAL DECISION MAKING: Stable/uncomplicated  EVALUATION COMPLEXITY: Low    GOALS: Short term PT Goals Target date: 06/22/2022   Pt will be I and compliant with HEP. Baseline:  Goal status: on going 05/31/2022 Pt will decrease pain by 25% overall Baseline: Goal status: on going 05/31/2022  Long term PT goals Target date:07/20/2022 Pt will improve  hip/knee strength to at least 5-/5 MMT to improve functional strength Baseline: Goal status: on going 05/31/2022 Pt will reduce pain to overall less than 2-3/10 with usual activity, walking, standing Baseline: Goal status: on going 05/31/2022 Pt will be able to ambulate community distances at least 1000 ft WNL gait pattern without complaints and navigate one flight of stairs reciprocal without complaints Baseline: Goal status: on going 05/31/2022  PLAN: PT FREQUENCY: 1-3 times per week   PT DURATION: 8 weeks  PLANNED INTERVENTIONS (unless contraindicated): aquatic PT, Canalith repositioning, cryotherapy, Electrical stimulation, Iontophoresis with 4 mg/ml dexamethasome, Moist heat, traction, Ultrasound, gait training, Therapeutic exercise, balance training, neuromuscular re-education, patient/family education, prosthetic training, manual techniques, passive ROM, dry needling, taping, vasopnuematic device, vestibular, spinal manipulations, joint manipulations  PLAN FOR NEXT SESSION:   Progressive strengthening avoiding pain increase. Single leg balance  Ivery Quale, PT, DPT 06/20/22 1:31 PM

## 2022-06-23 ENCOUNTER — Ambulatory Visit: Payer: Medicare Other | Admitting: Physical Therapy

## 2022-06-23 ENCOUNTER — Encounter: Payer: Self-pay | Admitting: Physical Therapy

## 2022-06-23 DIAGNOSIS — R262 Difficulty in walking, not elsewhere classified: Secondary | ICD-10-CM

## 2022-06-23 DIAGNOSIS — R600 Localized edema: Secondary | ICD-10-CM | POA: Diagnosis not present

## 2022-06-23 DIAGNOSIS — M6281 Muscle weakness (generalized): Secondary | ICD-10-CM | POA: Diagnosis not present

## 2022-06-23 DIAGNOSIS — M25561 Pain in right knee: Secondary | ICD-10-CM

## 2022-06-23 DIAGNOSIS — G8929 Other chronic pain: Secondary | ICD-10-CM | POA: Diagnosis not present

## 2022-06-23 NOTE — Therapy (Signed)
OUTPATIENT PHYSICAL THERAPY TREATMENT   Patient Name: Kathy Cook MRN: [id number] DOB:Jun 24, 1945, 77 y.o., female Today's Date: 06/23/2022  END OF SESSION:  PT End of Session - 06/23/22 1258     Visit Number 9    Number of Visits 15    Date for PT Re-Evaluation 07/20/22    Authorization Type U[employer] MCR    Progress Note Due on Visit 10    PT Start Time 1258    PT Stop Time 1336    PT Time Calculation (min) 38 min    Activity Tolerance Patient tolerated treatment well    Behavior During Therapy WFL for tasks assessed/performed                Past Medical History:  Diagnosis Date   Allergy    Anxiety    Attention deficit disorder (ADD)    Cancer    ? basal/squam cell on chest, and forehead   Depression    Dry eyes    GERD (gastroesophageal reflux disease)    diet controlled, no meds   HLD (hyperlipidemia)    HSV infection    Lumbar degenerative disc disease    Ocular rosacea    Osteopenia    Personal history of radiation therapy    Past Surgical History:  Procedure Laterality Date   BREAST BIOPSY Left 05/28/2019   BREAST LUMPECTOMY Left 07/18/2019   Procedure: LEFT BREAST LUMPECTOMY;  Surgeon: Abigail Miyamoto, MD;  Location: [facility];  Service: General;  Laterality: Left;  LMA   COLONOSCOPY  2019   RE-EXCISION OF BREAST LUMPECTOMY Left 09/05/2019   Procedure: RE-EXCISION OF LEFT BREAST TUMOR;  Surgeon: Abigail Miyamoto, MD;  Location: [facility];  Service: General;  Laterality: Left;  LMA   SKIN BIOPSY     ?basal/squam cell on chest   TONSILLECTOMY AND ADENOIDECTOMY     TOTAL KNEE ARTHROPLASTY Right 03/04/2019   Procedure: RIGHT TOTAL KNEE ARTHROPLASTY;  Surgeon: Eldred Manges, MD;  Location: [facility];  Service: Orthopedics;  Laterality: Right;   UPPER GI ENDOSCOPY     Patient Active Problem List   Diagnosis Date Noted   Quadriceps weakness 05/17/2022   Acute anal fissure 05/17/2020   Phyllodes tumor of breast 08/07/2019   Ductal  carcinoma in situ (DCIS) of left breast 08/06/2019   Status post total hip replacement, right 03/05/2019   Arthritis of right knee 03/04/2019   Unilateral primary osteoarthritis, right knee 02/05/2019   Knee locking, right 01/09/2019   Metatarsal stress fracture, right, initial encounter 10/03/2016   DJD (degenerative joint disease) 06/09/2016   Labile hypertension 08/01/2014   Vitamin D deficiency 08/01/2014   Medication management 08/01/2014   Obesity 07/04/2014   Hyperlipemia 06/27/2013   Attention deficit disorder (ADD)    GERD (gastroesophageal reflux disease)    Allergy    Depression, major, in remission (HCC)    Anemia    Ocular rosacea    HSV infection    Osteopenia     PCP: Lucky Cowboy, MD   REFERRING PROVIDER: Eldred Manges, MD,  REFERRING DIAG:  [phone] (ICD-10-CM) - Chronic pain of right knee  M62.81 (ICD-10-CM) - Quadriceps weakness    THERAPY DIAG:  Chronic pain of right knee  Muscle weakness (generalized)  Difficulty in walking, not elsewhere classified  Localized edema  Rationale for Evaluation and Treatment: Rehabilitation  ONSET DATE: Rt TKA 03/04/19 with residual quad weakness  SUBJECTIVE:   SUBJECTIVE STATEMENT: Denies pain today, her energy levels are  improving from last week, she went up the stairs today and did not have to hold on to rails  PERTINENT HISTORY:PMH: Rt TKA 03/04/19, Ca,lumbar DDD, osteopenia,  PAIN:  NPRS scale: no pain 0/10 Pain location:  Pain description:  Aggravating factors:  Relieving factors:   PRECAUTIONS: None  WEIGHT BEARING RESTRICTIONS: No  FALLS:  Has patient fallen in last 6 months? No  OCCUPATION:   PLOF: Independent  PATIENT GOALS: improve strength and walk longer as she has trip coming up to [place] end of May.   NEXT MD VISIT: nothing scheduled  OBJECTIVE:   DIAGNOSTIC FINDINGS:  05/25/2022 Result Date: 05/17/2022 Standing AP both knees lateral right knee and sunrise patella x-ray  demonstrates satisfactory right total knee arthroplasty without loosening or subsidence.  Opposite left knee shows mild arthritic changes. Impression: Satisfactory right total knee arthroplasty unchanged from January 2021 images.   PATIENT SURVEYS:  05/25/2022 FOTO wasn't set up for her so will omit  COGNITION: 05/25/2022 Overall cognitive status: Within functional limits for tasks assessed     SENSATION: 05/25/2022 She reports light sensation still diminished some around Rt knee  EDEMA:  05/25/2022 Eval: mild edema in Rt knee  MUSCLE LENGTH: 05/25/2022 Tight quads on Rt  POSTURE:   PALPATION:   LOWER EXTREMITY ROM:  Active ROM/PROM Right 05/25/2022 Left 05/25/2022  Hip flexion    Hip extension    Hip abduction    Hip adduction    Hip internal rotation    Hip external rotation    Knee flexion 115/120   Knee extension 0   Ankle dorsiflexion    Ankle plantarflexion    Ankle inversion    Ankle eversion     (Blank rows = not tested)  LOWER EXTREMITY MMT:  MMT Right 05/25/2022 Left 05/25/2022  Hip flexion 4 4+  Hip extension    Hip abduction 4 4+  Hip adduction    Hip internal rotation    Hip external rotation    Knee flexion 5 5  Knee extension 4 43# 4 39#  Ankle dorsiflexion    Ankle plantarflexion    Ankle inversion    Ankle eversion     (Blank rows = not tested)  LOWER EXTREMITY SPECIAL TESTS:    FUNCTIONAL TESTS:  05/25/2022: 5 times sit to stand: 8.9 seconds without UE support SLS avg of 6 seconds bilat  GAIT:Eval Comments: WFL gait, independent community Ambulator but does have pain with prolonged walking  TODAY'S TREATMENT:  06/20/22  TherEx  Nustep L6 x 8 minutes UE/LE  Shuttle LE press power up/slow down 150# 3 X 10 reps BLEs Single LE press power up/slow down 100# 2 x10 B  Leg extension machine Rt leg only 10# 2X10 then DL 16# 1W96 Leg curl machine DL 04# 5W09 TRX lunges X 10 bilat  TRX squats with 3 sec hold X15 Sidestepping with  with green band around ankles and squat each rep at counter top 2 round trips Monster walking with with green band around ankles and squat each rep at counter top 4 round trips at bars Seated SLR with 3# 2X10 each side Tandem stance 30 sec X 1 bilat Single leg stance 20 sec X 3 bilat   06/20/22  TherEx  Nustep L6 x 8 minutes UE/LE  Shuttle LE press power up/slow down 150# 3 X 10 reps BLEs Single LE press power up/slow down 100# 2 x10 B  Leg extension machine Rt leg only 10# 2X10 then DL 81# 1B14 Leg  curl machine DL 16# 1W96 TRX lunges X 10 bilat  Sidestepping with with green band around ankles and squat each rep at counter top 2 round trips Monster walking with with green band around ankles and squat each rep at counter top 4 round trips at bars Seated SLR with 3# 2X10 each side Tandem walk 20 feet X 4 reps with supervision Single leg stance 20 sec X 3 bilat Sit to stands no UE support 2 X 10, Rt foot slightly further back then one set of 10 with left leg slightly back  06/16/22  TherEx  Nustep L6 x 8 minutes UE/LE  Shuttle LE press power up/slow down 150# 3 X 10 reps BLEs Single LE press power up/slow down 100# 2 x10 B  Leg extension machine Rt leg only 10# 2X10 then DL 04# 5W09 Leg curl machine DL 81# 1B14 Step ups fwd on 6 inch step X 10 up with Rt and down in front with left Step ups lateral on 6 inch step X 10 bilat Sidestepping with with green band around ankles and squat each rep at counter top 2 round trips Monster walking with with green band around ankles and squat each rep at counter top 4 round trips at bars Seated SLR with 3# 2X10 each side Tandem walk 20 feet X 4 reps with supervision Single leg stance 20 sec X 3 bilat Sit to stands no UE support 2 X 10, Rt foot slightly further back, 10# weight   06/13/22  TherEx  Nustep L6 x 8 minutes UE/LE  Shuttle LE press power up/slow down 150# 2 X 10 reps BLEs Single LE press power up/slow down 125# 2 x10 B  Leg  extension machine Rt leg only 10# 2X10 then DL 78# 2N56 Leg curl machine DL 21# 3Y86 Step ups fwd on 6 inch step X 15 bilat Step ups lateral on 6 inch step X 10 bilat Sidestepping with with green band around ankles and squat each rep at counter top 2 round trips Monster walking with with green band around ankles and squat each rep at counter top 4 round trips at bars Seated SLR with 2.5# 2X10 each side Tandem walk 20 feet X 4 reps with supervision Single leg stance 20 sec X 2 bilat Sit to stands no UE support 2 X 10, Rt foot slightly further back   06/06/22  TherEx  Nustep L6 x 8 minutes UE/LE  Shuttle LE press power up/slow down 125# 2 X 10 reps BLEs Single LE press power up/slow down 125# x10 B  Leg extension machine Rt leg only 10# 2X10 then DL 57# 8I69 Leg curl machine DL 62# 9B28 Sidestepping with with green band around ankles and squat each rep at counter top 2 round trips Monster walking with with green band around ankles and squat each rep at counter top 2 round trips Seated SLR with 2.5# 2X10 each side Tandem walk 20 feet X 4 reps with supervision Single leg stance 20 sec X 3 bilat Sit to stands no UE support X 10, Rt foot slightly further back      PATIENT EDUCATION: 05/31/2022 Education details: HEP update Person educated: Patient Education method: Programmer, multimedia, Demonstration, Verbal cues, and Handouts Education comprehension: verbalized understanding and needs further education   HOME EXERCISE PROGRAM: Access Code: [email or web address] URL: [email or web address] Date: 05/25/2022 Prepared by: Ivery Quale  Exercises - Standing Quad Stretch with Strap  - 1 x daily - 6 x weekly - 1 sets - 3 reps -  30 sec hold - Standing Partial Lunge  - 1 x daily - 6 x weekly - 2 sets - 10 reps - Step Up  - 1 x daily - 6 x weekly - 1 sets - 10 reps - Sit to Stand Without Arm Support  - 1 x daily - 6 x weekly - 1-2 sets - 10 reps - Full Leg Press  - 1 x daily - 6 x weekly - 2-3  sets - 10-20 reps - Single Leg Press  - 1 x daily - 6 x weekly - 2-3 sets - 10-20 reps - Single Leg Knee Extension with Weight Machine  - 1 x daily - 6 x weekly - 2-3 sets - 10-20 reps  ASSESSMENT:  CLINICAL IMPRESSION:  She is improving with her strength and pain levels, we will do 10th visit progress note next visit.   OBJECTIVE IMPAIRMENTS: decreased activity tolerance, difficulty walking, decreased balance, decreased endurance, decreased mobility, decreased ROM, decreased strength, impaired flexibility, impaired LE use, postural dysfunction, and pain.  ACTIVITY LIMITATIONS: bending, lifting, carry, locomotion, cleaning, community activity, driving, and or occupation  PERSONAL FACTORS: PMH: Rt TKA 03/04/19, Ca,lumbar DDD, osteopenia, are also affecting patient's functional outcome.  REHAB POTENTIAL: Good  CLINICAL DECISION MAKING: Stable/uncomplicated  EVALUATION COMPLEXITY: Low    GOALS: Short term PT Goals Target date: 06/22/2022   Pt will be I and compliant with HEP. Baseline:  Goal status: on going 05/31/2022 Pt will decrease pain by 25% overall Baseline: Goal status: on going 05/31/2022  Long term PT goals Target date:07/20/2022 Pt will improve  hip/knee strength to at least 5-/5 MMT to improve functional strength Baseline: Goal status: on going 05/31/2022 Pt will reduce pain to overall less than 2-3/10 with usual activity, walking, standing Baseline: Goal status: on going 05/31/2022 Pt will be able to ambulate community distances at least 1000 ft WNL gait pattern without complaints and navigate one flight of stairs reciprocal without complaints Baseline: Goal status: on going 05/31/2022  PLAN: PT FREQUENCY: 1-3 times per week   PT DURATION: 8 weeks  PLANNED INTERVENTIONS (unless contraindicated): aquatic PT, Canalith repositioning, cryotherapy, Electrical stimulation, Iontophoresis with 4 mg/ml dexamethasome, Moist heat, traction, Ultrasound, gait training, Therapeutic  exercise, balance training, neuromuscular re-education, patient/family education, prosthetic training, manual techniques, passive ROM, dry needling, taping, vasopnuematic device, vestibular, spinal manipulations, joint manipulations  PLAN FOR NEXT SESSION:   10th visit progress note Ivery Quale, PT, DPT 06/23/22 12:58 PM

## 2022-06-27 ENCOUNTER — Encounter: Payer: Self-pay | Admitting: Physical Therapy

## 2022-06-27 ENCOUNTER — Ambulatory Visit: Payer: Medicare Other | Admitting: Physical Therapy

## 2022-06-27 DIAGNOSIS — R262 Difficulty in walking, not elsewhere classified: Secondary | ICD-10-CM

## 2022-06-27 DIAGNOSIS — M25561 Pain in right knee: Secondary | ICD-10-CM

## 2022-06-27 DIAGNOSIS — G8929 Other chronic pain: Secondary | ICD-10-CM

## 2022-06-27 DIAGNOSIS — R600 Localized edema: Secondary | ICD-10-CM

## 2022-06-27 DIAGNOSIS — M6281 Muscle weakness (generalized): Secondary | ICD-10-CM

## 2022-06-27 NOTE — Therapy (Addendum)
OUTPATIENT PHYSICAL THERAPY TREATMENT  Progress Note reporting period 05/25/22 to 06/27/22 See below for objective and subjective measurements relating to patients progress with PT.  Patient Name: Kathy Cook MRN: [id number] DOB:1945-12-09, 77 y.o., female Today's Date: 06/27/2022  END OF SESSION:  PT End of Session - 06/27/22 1324     Visit Number 10    Number of Visits 15    Date for PT Re-Evaluation 07/20/22    Authorization Type UHC MCR    Progress Note Due on Visit 10    PT Start Time 1300    PT Stop Time 1343    PT Time Calculation (min) 43 min    Activity Tolerance Patient tolerated treatment well    Behavior During Therapy WFL for tasks assessed/performed                Past Medical History:  Diagnosis Date   Allergy    Anxiety    Attention deficit disorder (ADD)    Cancer (HCC)    ? basal/squam cell on chest, and forehead   Depression    Dry eyes    GERD (gastroesophageal reflux disease)    diet controlled, no meds   HLD (hyperlipidemia)    HSV infection    Lumbar degenerative disc disease    Ocular rosacea    Osteopenia    Personal history of radiation therapy    Past Surgical History:  Procedure Laterality Date   BREAST BIOPSY Left 05/28/2019   BREAST LUMPECTOMY Left 07/18/2019   Procedure: LEFT BREAST LUMPECTOMY;  Surgeon: Abigail Miyamoto, MD;  Location: [facility];  Service: General;  Laterality: Left;  LMA   COLONOSCOPY  2019   RE-EXCISION OF BREAST LUMPECTOMY Left 09/05/2019   Procedure: RE-EXCISION OF LEFT BREAST TUMOR;  Surgeon: Abigail Miyamoto, MD;  Location: [facility];  Service: General;  Laterality: Left;  LMA   SKIN BIOPSY     ?basal/squam cell on chest   TONSILLECTOMY AND ADENOIDECTOMY     TOTAL KNEE ARTHROPLASTY Right 03/04/2019   Procedure: RIGHT TOTAL KNEE ARTHROPLASTY;  Surgeon: Eldred Manges, MD;  Location: [facility];  Service: Orthopedics;  Laterality: Right;   UPPER GI ENDOSCOPY     Patient Active Problem List    Diagnosis Date Noted   Quadriceps weakness 05/17/2022   Acute anal fissure 05/17/2020   Phyllodes tumor of breast 08/07/2019   Ductal carcinoma in situ (DCIS) of left breast 08/06/2019   Status post total hip replacement, right 03/05/2019   Arthritis of right knee 03/04/2019   Unilateral primary osteoarthritis, right knee 02/05/2019   Knee locking, right 01/09/2019   Metatarsal stress fracture, right, initial encounter 10/03/2016   DJD (degenerative joint disease) 06/09/2016   Labile hypertension 08/01/2014   Vitamin D deficiency 08/01/2014   Medication management 08/01/2014   Obesity 07/04/2014   Hyperlipemia 06/27/2013   Attention deficit disorder (ADD)    GERD (gastroesophageal reflux disease)    Allergy    Depression, major, in remission (HCC)    Anemia    Ocular rosacea    HSV infection    Osteopenia     PCP: Lucky Cowboy, MD   REFERRING PROVIDER: Eldred Manges, MD,  REFERRING DIAG:  [phone] (ICD-10-CM) - Chronic pain of right knee  M62.81 (ICD-10-CM) - Quadriceps weakness    THERAPY DIAG:  Chronic pain of right knee  Muscle weakness (generalized)  Difficulty in walking, not elsewhere classified  Localized edema  Rationale for Evaluation and Treatment: Rehabilitation  ONSET  DATE: Rt TKA 03/04/19 with residual quad weakness  SUBJECTIVE:   SUBJECTIVE STATEMENT: She relays some soreness in her Rt knee but not much pain today  PERTINENT HISTORY:PMH: Rt TKA 03/04/19, Ca,lumbar DDD, osteopenia,  PAIN:  NPRS scale: 110 Pain location: Rt knee Pain description: sorness Aggravating factors:  Relieving factors:   PRECAUTIONS: None  WEIGHT BEARING RESTRICTIONS: No  FALLS:  Has patient fallen in last 6 months? No  OCCUPATION:   PLOF: Independent  PATIENT GOALS: improve strength and walk longer as she has trip coming up to [place] end of May.   NEXT MD VISIT: nothing scheduled  OBJECTIVE:   DIAGNOSTIC FINDINGS:  05/25/2022 Result Date:  05/17/2022 Standing AP both knees lateral right knee and sunrise patella x-ray demonstrates satisfactory right total knee arthroplasty without loosening or subsidence.  Opposite left knee shows mild arthritic changes. Impression: Satisfactory right total knee arthroplasty unchanged from January 2021 images.   PATIENT SURVEYS:  05/25/2022 FOTO wasn't set up for her so will omit  COGNITION: 05/25/2022 Overall cognitive status: Within functional limits for tasks assessed     SENSATION: 05/25/2022 She reports light sensation still diminished some around Rt knee  EDEMA:  05/25/2022 Eval: mild edema in Rt knee  MUSCLE LENGTH: 05/25/2022 Tight quads on Rt  POSTURE:   PALPATION:   LOWER EXTREMITY ROM:  Active ROM/PROM Right 05/25/2022 Right 06/27/2022  Hip flexion    Hip extension    Hip abduction    Hip adduction    Hip internal rotation    Hip external rotation    Knee flexion 115/120 118/  Knee extension 0   Ankle dorsiflexion    Ankle plantarflexion    Ankle inversion    Ankle eversion     (Blank rows = not tested)  LOWER EXTREMITY MMT:  MMT Left 05/25/2022 Right 05/25/2022 Right 06/27/22  Hip flexion 4 4+ 5-  Hip extension     Hip abduction 4 4+ 5-  Hip adduction     Hip internal rotation     Hip external rotation     Knee flexion 5 5 5   Knee extension 4 43# 4 39# 4+ 47#  Ankle dorsiflexion     Ankle plantarflexion     Ankle inversion     Ankle eversion      (Blank rows = not tested)  LOWER EXTREMITY SPECIAL TESTS:    FUNCTIONAL TESTS:  05/25/2022: 5 times sit to stand: 8.9 seconds without UE support SLS avg of 6 seconds bilat  06/27/22 SLS avg 13.3 seconds, was able to perform 20+ seconds at best  GAIT:Eval Comments: WFL gait, independent community Ambulator but does have pain with prolonged walking  TODAY'S TREATMENT:  06/27/22  TherEx  Nustep L6 x 10 minutes UE/LE  Updated measurements and goals for Progress note Shuttle LE press power  up/slow down 156# 2 X 10 reps BLEs Single LE press power up/slow down 100# 2 x10 B  Leg extension machine Rt leg only 10# 2X15 then DL 16# 1W96 Leg curl machine DL 04# 5W09 TRX lunges (held today due to knee soreness with them) TRX squats with 3 sec hold X15, then 10 reps Sidestepping with with green band around ankles and squat each rep at counter top 2 round trips Monster walking with with green band around ankles and squat each rep at counter top 2 round trips at bars Single leg stance 20 sec X 3 bilat  06/23/22  TherEx  Nustep L6 x 8 minutes UE/LE  Shuttle  LE press power up/slow down 150# 3 X 10 reps BLEs Single LE press power up/slow down 100# 2 x10 B  Leg extension machine Rt leg only 10# 2X10 then DL 16# 1W96 Leg curl machine DL 04# 5W09 TRX lunges X 10 bilat  TRX squats with 3 sec hold X15 Sidestepping with with green band around ankles and squat each rep at counter top 2 round trips Monster walking with with green band around ankles and squat each rep at counter top 4 round trips at bars Seated SLR with 3# 2X10 each side Tandem stance 30 sec X 1 bilat Single leg stance 20 sec X 3 bilat     PATIENT EDUCATION: 05/31/2022 Education details: HEP update Person educated: Patient Education method: Programmer, multimedia, Demonstration, Verbal cues, and Handouts Education comprehension: verbalized understanding and needs further education   HOME EXERCISE PROGRAM: Access Code: [email or web address] URL: [email or web address] Date: 05/25/2022 Prepared by: Ivery Quale  Exercises - Standing Quad Stretch with Strap  - 1 x daily - 6 x weekly - 1 sets - 3 reps - 30 sec hold - Standing Partial Lunge  - 1 x daily - 6 x weekly - 2 sets - 10 reps - Step Up  - 1 x daily - 6 x weekly - 1 sets - 10 reps - Sit to Stand Without Arm Support  - 1 x daily - 6 x weekly - 1-2 sets - 10 reps - Full Leg Press  - 1 x daily - 6 x weekly - 2-3 sets - 10-20 reps - Single Leg Press  - 1 x daily - 6 x  weekly - 2-3 sets - 10-20 reps - Single Leg Knee Extension with Weight Machine  - 1 x daily - 6 x weekly - 2-3 sets - 10-20 reps  ASSESSMENT:  CLINICAL IMPRESSION:  10th visit progress note reflects she has made improvements in Rt knee ROM and strength as well as single leg balance. Her functional abillities continue to improve with less overall pain reported. PT recommending to continue with current plan to maximize function.   OBJECTIVE IMPAIRMENTS: decreased activity tolerance, difficulty walking, decreased balance, decreased endurance, decreased mobility, decreased ROM, decreased strength, impaired flexibility, impaired LE use, postural dysfunction, and pain.  ACTIVITY LIMITATIONS: bending, lifting, carry, locomotion, cleaning, community activity, driving, and or occupation  PERSONAL FACTORS: PMH: Rt TKA 03/04/19, Ca,lumbar DDD, osteopenia, are also affecting patient's functional outcome.  REHAB POTENTIAL: Good  CLINICAL DECISION MAKING: Stable/uncomplicated  EVALUATION COMPLEXITY: Low    GOALS: Short term PT Goals Target date: 06/22/2022   Pt will be I and compliant with HEP. Baseline:  Goal status: MET 06/27/2022 Pt will decrease pain by 25% overall Baseline: Goal status: MET 06/27/2022  Long term PT goals Target date:07/20/2022 Pt will improve  hip/knee strength to at least 5-/5 MMT to improve functional strength Baseline: Goal status: MET 05/31/2022 Pt will reduce pain to overall less than 2-3/10 with usual activity, walking, standing Baseline: Goal status: MET 05/31/2022 Pt will be able to ambulate community distances at least 1000 ft WNL gait pattern without complaints and navigate one flight of stairs reciprocal without complaints Baseline: Goal status: on going 06/27/2022, still some difficulty with longer distances and stairs at times  PLAN: PT FREQUENCY: 1-3 times per week   PT DURATION: 8 weeks  PLANNED INTERVENTIONS (unless contraindicated): aquatic PT, Canalith  repositioning, cryotherapy, Electrical stimulation, Iontophoresis with 4 mg/ml dexamethasome, Moist heat, traction, Ultrasound, gait training, Therapeutic exercise, balance training, neuromuscular re-education, patient/family education, prosthetic training,  manual techniques, passive ROM, dry needling, taping, vasopnuematic device, vestibular, spinal manipulations, joint manipulations  PLAN FOR NEXT SESSION:  Progress knee strength and SL balance and stairs as able  Ivery Quale, PT, DPT 06/27/22 1:24 PM

## 2022-06-29 ENCOUNTER — Ambulatory Visit
Admission: RE | Admit: 2022-06-29 | Discharge: 2022-06-29 | Disposition: A | Discharge status: Home / Self Care | Payer: Medicare Other | Source: Ambulatory Visit | Attending: Internal Medicine | Admitting: Internal Medicine

## 2022-06-29 DIAGNOSIS — N644 Mastodynia: Secondary | ICD-10-CM | POA: Diagnosis not present

## 2022-06-29 DIAGNOSIS — Z853 Personal history of malignant neoplasm of breast: Secondary | ICD-10-CM

## 2022-06-30 ENCOUNTER — Ambulatory Visit: Payer: Medicare Other | Admitting: Physical Therapy

## 2022-06-30 ENCOUNTER — Encounter: Payer: Self-pay | Admitting: Physical Therapy

## 2022-06-30 DIAGNOSIS — G8929 Other chronic pain: Secondary | ICD-10-CM | POA: Diagnosis not present

## 2022-06-30 DIAGNOSIS — R600 Localized edema: Secondary | ICD-10-CM

## 2022-06-30 DIAGNOSIS — M6281 Muscle weakness (generalized): Secondary | ICD-10-CM | POA: Diagnosis not present

## 2022-06-30 DIAGNOSIS — R262 Difficulty in walking, not elsewhere classified: Secondary | ICD-10-CM | POA: Diagnosis not present

## 2022-06-30 DIAGNOSIS — M25561 Pain in right knee: Secondary | ICD-10-CM | POA: Diagnosis not present

## 2022-06-30 NOTE — Therapy (Signed)
OUTPATIENT PHYSICAL THERAPY TREATMENT  Patient Name: Kathy Cook MRN: [id number] DOB:[birth year], 77 y.o., female Today's Date: 06/30/2022  END OF SESSION:  PT End of Session - 06/30/22 1310     Visit Number 11    Number of Visits 15    Date for PT Re-Evaluation 07/20/22    Authorization Type [employer] MCR    Progress Note Due on Visit 10    PT Start Time 1302    PT Stop Time 1345    PT Time Calculation (min) 43 min    Activity Tolerance Patient tolerated treatment well    Behavior During Therapy WFL for tasks assessed/performed                Past Medical History:  Diagnosis Date   Allergy    Anxiety    Attention deficit disorder (ADD)    Cancer (HCC)    ? basal/squam cell on chest, and forehead   Depression    Dry eyes    GERD (gastroesophageal reflux disease)    diet controlled, no meds   HLD (hyperlipidemia)    HSV infection    Lumbar degenerative disc disease    Ocular rosacea    Osteopenia    Personal history of radiation therapy    Past Surgical History:  Procedure Laterality Date   BREAST BIOPSY Left 05/28/2019   BREAST LUMPECTOMY Left 07/18/2019   Procedure: LEFT BREAST LUMPECTOMY;  Surgeon: Abigail Miyamoto, MD;  Location: [facility];  Service: General;  Laterality: Left;  LMA   COLONOSCOPY  2019   RE-EXCISION OF BREAST LUMPECTOMY Left 09/05/2019   Procedure: RE-EXCISION OF LEFT BREAST TUMOR;  Surgeon: Abigail Miyamoto, MD;  Location: [facility];  Service: General;  Laterality: Left;  LMA   SKIN BIOPSY     ?basal/squam cell on chest   TONSILLECTOMY AND ADENOIDECTOMY     TOTAL KNEE ARTHROPLASTY Right 03/04/2019   Procedure: RIGHT TOTAL KNEE ARTHROPLASTY;  Surgeon: Eldred Manges, MD;  Location: [facility];  Service: Orthopedics;  Laterality: Right;   UPPER GI ENDOSCOPY     Patient Active Problem List   Diagnosis Date Noted   Quadriceps weakness 05/17/2022   Acute anal fissure 05/17/2020   Phyllodes tumor of breast 08/07/2019   Ductal  carcinoma in situ (DCIS) of left breast 08/06/2019   Status post total hip replacement, right 03/05/2019   Arthritis of right knee 03/04/2019   Unilateral primary osteoarthritis, right knee 02/05/2019   Knee locking, right 01/09/2019   Metatarsal stress fracture, right, initial encounter 10/03/2016   DJD (degenerative joint disease) 06/09/2016   Labile hypertension 08/01/2014   Vitamin D deficiency 08/01/2014   Medication management 08/01/2014   Obesity 07/04/2014   Hyperlipemia 06/27/2013   Attention deficit disorder (ADD)    GERD (gastroesophageal reflux disease)    Allergy    Depression, major, in remission (HCC)    Anemia    Ocular rosacea    HSV infection    Osteopenia     PCP: Lucky Cowboy, MD   REFERRING PROVIDER: Eldred Manges, MD,  REFERRING DIAG:  [phone] (ICD-10-CM) - Chronic pain of right knee  M62.81 (ICD-10-CM) - Quadriceps weakness    THERAPY DIAG:  Chronic pain of right knee  Muscle weakness (generalized)  Difficulty in walking, not elsewhere classified  Localized edema  Rationale for Evaluation and Treatment: Rehabilitation  ONSET DATE: Rt TKA 03/04/19 with residual quad weakness  SUBJECTIVE:   SUBJECTIVE STATEMENT: She relays some pain on the top  of her Rt foot, her knee is doing good.  PERTINENT HISTORY:PMH: Rt TKA 03/04/19, Ca,lumbar DDD, osteopenia,  PAIN:  NPRS scale: 1/10 Pain location: Rt knee Pain description: sorness Aggravating factors:  Relieving factors:   PRECAUTIONS: None  WEIGHT BEARING RESTRICTIONS: No  FALLS:  Has patient fallen in last 6 months? No  OCCUPATION:   PLOF: Independent  PATIENT GOALS: improve strength and walk longer as she has trip coming up to [place] end of May.   NEXT MD VISIT: nothing scheduled  OBJECTIVE:   DIAGNOSTIC FINDINGS:  05/25/2022 Result Date: 05/17/2022 Standing AP both knees lateral right knee and sunrise patella x-ray demonstrates satisfactory right total knee  arthroplasty without loosening or subsidence.  Opposite left knee shows mild arthritic changes. Impression: Satisfactory right total knee arthroplasty unchanged from January 2021 images.   PATIENT SURVEYS:  05/25/2022 FOTO wasn't set up for her so will omit  COGNITION: 05/25/2022 Overall cognitive status: Within functional limits for tasks assessed     SENSATION: 05/25/2022 She reports light sensation still diminished some around Rt knee  EDEMA:  05/25/2022 Eval: mild edema in Rt knee  MUSCLE LENGTH: 05/25/2022 Tight quads on Rt  POSTURE:   PALPATION:   LOWER EXTREMITY ROM:  Active ROM/PROM Right 05/25/2022 Right 06/27/2022  Hip flexion    Hip extension    Hip abduction    Hip adduction    Hip internal rotation    Hip external rotation    Knee flexion 115/120 118/  Knee extension 0   Ankle dorsiflexion    Ankle plantarflexion    Ankle inversion    Ankle eversion     (Blank rows = not tested)  LOWER EXTREMITY MMT:  MMT Left 05/25/2022 Right 05/25/2022 Right 06/27/22  Hip flexion 4 4+ 5-  Hip extension     Hip abduction 4 4+ 5-  Hip adduction     Hip internal rotation     Hip external rotation     Knee flexion 5 5 5   Knee extension 4 43# 4 39# 4+ 47#  Ankle dorsiflexion     Ankle plantarflexion     Ankle inversion     Ankle eversion      (Blank rows = not tested)  LOWER EXTREMITY SPECIAL TESTS:    FUNCTIONAL TESTS:  05/25/2022: 5 times sit to stand: 8.9 seconds without UE support SLS avg of 6 seconds bilat  06/27/22 SLS avg 13.3 seconds, was able to perform 20+ seconds at best  GAIT:Eval Comments: WFL gait, independent community Ambulator but does have pain with prolonged walking  TODAY'S TREATMENT:  06/30/22  TherEx  Recumbent bike L 3 X 10 minutes   Shuttle LE press power up/slow down 156# 2 X 10 reps BLEs Single LE press power up/slow down 100# 2 x10 B  Leg extension machine Rt leg only 10# 2X15 then DL 45# [id number] Leg curl machine DL 11#  [id number] TRX lunges (held today due to knee soreness with them) TRX squats with 3 sec hold 2 X 10 reps Sidestepping with with green band around ankles and squat each rep at counter top 3 round trips Monster walking with with green band around ankles and squat each rep at counter top 3 round trips at bars Single leg stance 20 sec X 3 bilat  TODAY'S TREATMENT:  06/27/22   TherEx   Nustep L6 x 10 minutes UE/LE  Updated measurements and goals for Progress note Shuttle LE press power up/slow down 156# 2 X 10 reps BLEs  Single LE press power up/slow down 100# 2 x10 B  Leg extension machine Rt leg only 10# 2X15 then DL 16# [id number] Leg curl machine DL 04# 5W09 TRX lunges (held today due to knee soreness with them) TRX squats with 3 sec hold X15, then 10 reps Sidestepping with with green band around ankles and squat each rep at counter top 2 round trips Monster walking with with green band around ankles and squat each rep at counter top 2 round trips at bars Single leg stance 20 sec X 3 bilat    PATIENT EDUCATION: 05/31/2022 Education details: HEP update Person educated: Patient Education method: Programmer, multimedia, Demonstration, Verbal cues, and Handouts Education comprehension: verbalized understanding and needs further education   HOME EXERCISE PROGRAM: Access Code: [email or web address] URL: [email or web address] Date: 05/25/2022 Prepared by: Ivery Quale  Exercises - Standing Quad Stretch with Strap  - 1 x daily - 6 x weekly - 1 sets - 3 reps - 30 sec hold - Standing Partial Lunge  - 1 x daily - 6 x weekly - 2 sets - 10 reps - Step Up  - 1 x daily - 6 x weekly - 1 sets - 10 reps - Sit to Stand Without Arm Support  - 1 x daily - 6 x weekly - 1-2 sets - 10 reps - Full Leg Press  - 1 x daily - 6 x weekly - 2-3 sets - 10-20 reps - Single Leg Press  - 1 x daily - 6 x weekly - 2-3 sets - 10-20 reps - Single Leg Knee Extension with Weight Machine  - 1 x daily - 6 x weekly - 2-3 sets - 10-20  reps  ASSESSMENT:  CLINICAL IMPRESSION:  She is doing well with gradual strength progressions and will continue to benefit from PT.  OBJECTIVE IMPAIRMENTS: decreased activity tolerance, difficulty walking, decreased balance, decreased endurance, decreased mobility, decreased ROM, decreased strength, impaired flexibility, impaired LE use, postural dysfunction, and pain.  ACTIVITY LIMITATIONS: bending, lifting, carry, locomotion, cleaning, community activity, driving, and or occupation  PERSONAL FACTORS: PMH: Rt TKA 03/04/19, Ca,lumbar DDD, osteopenia, are also affecting patient's functional outcome.  REHAB POTENTIAL: Good  CLINICAL DECISION MAKING: Stable/uncomplicated  EVALUATION COMPLEXITY: Low    GOALS: Short term PT Goals Target date: 06/22/2022   Pt will be I and compliant with HEP. Baseline:  Goal status: MET 06/27/2022 Pt will decrease pain by 25% overall Baseline: Goal status: MET 06/27/2022  Long term PT goals Target date:07/20/2022 Pt will improve  hip/knee strength to at least 5-/5 MMT to improve functional strength Baseline: Goal status: MET 05/31/2022 Pt will reduce pain to overall less than 2-3/10 with usual activity, walking, standing Baseline: Goal status: MET 05/31/2022 Pt will be able to ambulate community distances at least 1000 ft WNL gait pattern without complaints and navigate one flight of stairs reciprocal without complaints Baseline: Goal status: on going 06/27/2022, still some difficulty with longer distances and stairs at times  PLAN: PT FREQUENCY: 1-3 times per week   PT DURATION: 8 weeks  PLANNED INTERVENTIONS (unless contraindicated): aquatic PT, Canalith repositioning, cryotherapy, Electrical stimulation, Iontophoresis with 4 mg/ml dexamethasome, Moist heat, traction, Ultrasound, gait training, Therapeutic exercise, balance training, neuromuscular re-education, patient/family education, prosthetic training, manual techniques, passive ROM, dry  needling, taping, vasopnuematic device, vestibular, spinal manipulations, joint manipulations  PLAN FOR NEXT SESSION:  Progress knee strength and SL balance and stairs as able  Ivery Quale, PT, DPT 06/30/22 1:10 PM

## 2022-07-04 ENCOUNTER — Encounter: Payer: Self-pay | Admitting: Physical Therapy

## 2022-07-04 ENCOUNTER — Ambulatory Visit: Payer: Medicare Other | Admitting: Physical Therapy

## 2022-07-04 DIAGNOSIS — M6281 Muscle weakness (generalized): Secondary | ICD-10-CM

## 2022-07-04 DIAGNOSIS — R600 Localized edema: Secondary | ICD-10-CM

## 2022-07-04 DIAGNOSIS — M25561 Pain in right knee: Secondary | ICD-10-CM

## 2022-07-04 DIAGNOSIS — R262 Difficulty in walking, not elsewhere classified: Secondary | ICD-10-CM

## 2022-07-04 DIAGNOSIS — G8929 Other chronic pain: Secondary | ICD-10-CM | POA: Diagnosis not present

## 2022-07-04 NOTE — Therapy (Signed)
OUTPATIENT PHYSICAL THERAPY TREATMENT  Patient Name: Kathy Cook MRN: [id number] DOB:[birth year], 77 y.o., female Today's Date: 07/04/2022  END OF SESSION:  PT End of Session - 07/04/22 1325     Visit Number 12    Number of Visits 15    Date for PT Re-Evaluation 07/20/22    Authorization Type UHC MCR    Progress Note Due on Visit 10    PT Start Time 1300    PT Stop Time 1343    PT Time Calculation (min) 43 min    Activity Tolerance Patient tolerated treatment well    Behavior During Therapy WFL for tasks assessed/performed                Past Medical History:  Diagnosis Date   Allergy    Anxiety    Attention deficit disorder (ADD)    Cancer (HCC)    ? basal/squam cell on chest, and forehead   Depression    Dry eyes    GERD (gastroesophageal reflux disease)    diet controlled, no meds   HLD (hyperlipidemia)    HSV infection    Lumbar degenerative disc disease    Ocular rosacea    Osteopenia    Personal history of radiation therapy    Past Surgical History:  Procedure Laterality Date   BREAST BIOPSY Left 05/28/2019   BREAST LUMPECTOMY Left 07/18/2019   Procedure: LEFT BREAST LUMPECTOMY;  Surgeon: Abigail Miyamoto, MD;  Location: [facility];  Service: General;  Laterality: Left;  LMA   COLONOSCOPY  2019   RE-EXCISION OF BREAST LUMPECTOMY Left 09/05/2019   Procedure: RE-EXCISION OF LEFT BREAST TUMOR;  Surgeon: Abigail Miyamoto, MD;  Location: [facility];  Service: General;  Laterality: Left;  LMA   SKIN BIOPSY     ?basal/squam cell on chest   TONSILLECTOMY AND ADENOIDECTOMY     TOTAL KNEE ARTHROPLASTY Right 03/04/2019   Procedure: RIGHT TOTAL KNEE ARTHROPLASTY;  Surgeon: Eldred Manges, MD;  Location: [facility];  Service: Orthopedics;  Laterality: Right;   UPPER GI ENDOSCOPY     Patient Active Problem List   Diagnosis Date Noted   Quadriceps weakness 05/17/2022   Acute anal fissure 05/17/2020   Phyllodes tumor of breast 08/07/2019   Ductal  carcinoma in situ (DCIS) of left breast 08/06/2019   Status post total hip replacement, right 03/05/2019   Arthritis of right knee 03/04/2019   Unilateral primary osteoarthritis, right knee 02/05/2019   Knee locking, right 01/09/2019   Metatarsal stress fracture, right, initial encounter 10/03/2016   DJD (degenerative joint disease) 06/09/2016   Labile hypertension 08/01/2014   Vitamin D deficiency 08/01/2014   Medication management 08/01/2014   Obesity 07/04/2014   Hyperlipemia 06/27/2013   Attention deficit disorder (ADD)    GERD (gastroesophageal reflux disease)    Allergy    Depression, major, in remission (HCC)    Anemia    Ocular rosacea    HSV infection    Osteopenia     PCP: Lucky Cowboy, MD   REFERRING PROVIDER: Eldred Manges, MD,  REFERRING DIAG:  [phone] (ICD-10-CM) - Chronic pain of right knee  M62.81 (ICD-10-CM) - Quadriceps weakness    THERAPY DIAG:  Chronic pain of right knee  Muscle weakness (generalized)  Difficulty in walking, not elsewhere classified  Localized edema  Rationale for Evaluation and Treatment: Rehabilitation  ONSET DATE: Rt TKA 03/04/19 with residual quad weakness  SUBJECTIVE:   SUBJECTIVE STATEMENT: She relays her knee is doing pretty  good today  PERTINENT HISTORY:PMH: Rt TKA 03/04/19, Ca,lumbar DDD, osteopenia,  PAIN:  NPRS scale: 1/10 Pain location: Rt knee Pain description: sorness Aggravating factors:  Relieving factors:   PRECAUTIONS: None  WEIGHT BEARING RESTRICTIONS: No  FALLS:  Has patient fallen in last 6 months? No  OCCUPATION:   PLOF: Independent  PATIENT GOALS: improve strength and walk longer as she has trip coming up to [place] end of May.   NEXT MD VISIT: nothing scheduled  OBJECTIVE:   DIAGNOSTIC FINDINGS:  05/25/2022 Result Date: 05/17/2022 Standing AP both knees lateral right knee and sunrise patella x-ray demonstrates satisfactory right total knee arthroplasty without loosening or  subsidence.  Opposite left knee shows mild arthritic changes. Impression: Satisfactory right total knee arthroplasty unchanged from January 2021 images.   PATIENT SURVEYS:  05/25/2022 FOTO wasn't set up for her so will omit  COGNITION: 05/25/2022 Overall cognitive status: Within functional limits for tasks assessed     SENSATION: 05/25/2022 She reports light sensation still diminished some around Rt knee  EDEMA:  05/25/2022 Eval: mild edema in Rt knee  MUSCLE LENGTH: 05/25/2022 Tight quads on Rt  POSTURE:   PALPATION:   LOWER EXTREMITY ROM:  Active ROM/PROM Right 05/25/2022 Right 06/27/2022  Hip flexion    Hip extension    Hip abduction    Hip adduction    Hip internal rotation    Hip external rotation    Knee flexion 115/120 118/  Knee extension 0   Ankle dorsiflexion    Ankle plantarflexion    Ankle inversion    Ankle eversion     (Blank rows = not tested)  LOWER EXTREMITY MMT:  MMT Left 05/25/2022 Right 05/25/2022 Right 06/27/22  Hip flexion 4 4+ 5-  Hip extension     Hip abduction 4 4+ 5-  Hip adduction     Hip internal rotation     Hip external rotation     Knee flexion 5 5 5   Knee extension 4 43# 4 39# 4+ 47#  Ankle dorsiflexion     Ankle plantarflexion     Ankle inversion     Ankle eversion      (Blank rows = not tested)  LOWER EXTREMITY SPECIAL TESTS:    FUNCTIONAL TESTS:  05/25/2022: 5 times sit to stand: 8.9 seconds without UE support SLS avg of 6 seconds bilat  06/27/22 SLS avg 13.3 seconds, was able to perform 20+ seconds at best  GAIT:Eval Comments: WFL gait, independent community Ambulator but does have pain with prolonged walking  TODAY'S TREATMENT:  07/04/22  TherEx  Recumbent bike L 3 X 10 minutes   Shuttle LE press power up/slow down 156# 2 X 10 reps BLEs Single LE press power up/slow down 100# 2 x10 B  Leg extension machine Rt leg only 10# 2X15 then DL 16# X 7 (stopped due to pain) Leg curl machine DL 10# [id number] TRX  lunges (held today due to knee soreness with them) TRX squats with 3 sec hold 2 X 10 reps Sidestepping with with green band around ankles and squat each rep at counter top 3 round trips Monster walking with with green band around ankles and squat each rep at counter top 3 round trips at bars Single leg stance 20 sec X 3 bilat  06/30/22  TherEx  Recumbent bike L 3 X 10 minutes   Shuttle LE press power up/slow down 156# 2 X 10 reps BLEs Single LE press power up/slow down 100# 2 x10 B  Leg  extension machine Rt leg only 10# 2X15 then DL 13# [id number] Leg curl machine DL 78# [id number] TRX lunges (held today due to knee soreness with them) TRX squats with 3 sec hold 2 X 10 reps Sidestepping with with green band around ankles and squat each rep at counter top 3 round trips Monster walking with with green band around ankles and squat each rep at counter top 3 round trips at bars Single leg stance 20 sec X 3 bilat    PATIENT EDUCATION: 05/31/2022 Education details: HEP update Person educated: Patient Education method: Programmer, multimedia, Demonstration, Verbal cues, and Handouts Education comprehension: verbalized understanding and needs further education   HOME EXERCISE PROGRAM: Access Code: [email or web address] URL: [email or web address] Date: 05/25/2022 Prepared by: Ivery Quale  Exercises - Standing Quad Stretch with Strap  - 1 x daily - 6 x weekly - 1 sets - 3 reps - 30 sec hold - Standing Partial Lunge  - 1 x daily - 6 x weekly - 2 sets - 10 reps - Step Up  - 1 x daily - 6 x weekly - 1 sets - 10 reps - Sit to Stand Without Arm Support  - 1 x daily - 6 x weekly - 1-2 sets - 10 reps - Full Leg Press  - 1 x daily - 6 x weekly - 2-3 sets - 10-20 reps - Single Leg Press  - 1 x daily - 6 x weekly - 2-3 sets - 10-20 reps - Single Leg Knee Extension with Weight Machine  - 1 x daily - 6 x weekly - 2-3 sets - 10-20 reps  ASSESSMENT:  CLINICAL IMPRESSION:  She is doing well with gradual strength  progressions although did have some pain with leg extension machine today so this was discontinued. She has one more visit scheduled and we will place on hold after that as she will be out of town for 3 weeks  OBJECTIVE IMPAIRMENTS: decreased activity tolerance, difficulty walking, decreased balance, decreased endurance, decreased mobility, decreased ROM, decreased strength, impaired flexibility, impaired LE use, postural dysfunction, and pain.  ACTIVITY LIMITATIONS: bending, lifting, carry, locomotion, cleaning, community activity, driving, and or occupation  PERSONAL FACTORS: PMH: Rt TKA 03/04/19, Ca,lumbar DDD, osteopenia, are also affecting patient's functional outcome.  REHAB POTENTIAL: Good  CLINICAL DECISION MAKING: Stable/uncomplicated  EVALUATION COMPLEXITY: Low    GOALS: Short term PT Goals Target date: 06/22/2022   Pt will be I and compliant with HEP. Baseline:  Goal status: MET 06/27/2022 Pt will decrease pain by 25% overall Baseline: Goal status: MET 06/27/2022  Long term PT goals Target date:07/20/2022 Pt will improve  hip/knee strength to at least 5-/5 MMT to improve functional strength Baseline: Goal status: MET 05/31/2022 Pt will reduce pain to overall less than 2-3/10 with usual activity, walking, standing Baseline: Goal status: MET 05/31/2022 Pt will be able to ambulate community distances at least 1000 ft WNL gait pattern without complaints and navigate one flight of stairs reciprocal without complaints Baseline: Goal status: on going 06/27/2022, still some difficulty with longer distances and stairs at times  PLAN: PT FREQUENCY: 1-3 times per week   PT DURATION: 8 weeks  PLANNED INTERVENTIONS (unless contraindicated): aquatic PT, Canalith repositioning, cryotherapy, Electrical stimulation, Iontophoresis with 4 mg/ml dexamethasome, Moist heat, traction, Ultrasound, gait training, Therapeutic exercise, balance training, neuromuscular re-education, patient/family  education, prosthetic training, manual techniques, passive ROM, dry needling, taping, vasopnuematic device, vestibular, spinal manipulations, joint manipulations  PLAN FOR NEXT SESSION:  Hold PT after next session as she will  miss 3 weeks traveling.  Ivery Quale, PT, DPT 07/04/22 1:25 PM

## 2022-07-07 ENCOUNTER — Ambulatory Visit: Payer: Medicare Other | Admitting: Physical Therapy

## 2022-07-07 ENCOUNTER — Ambulatory Visit
Admission: RE | Admit: 2022-07-07 | Discharge: 2022-07-07 | Disposition: A | Discharge status: Home / Self Care | Payer: Medicare Other | Source: Ambulatory Visit | Attending: Internal Medicine | Admitting: Internal Medicine

## 2022-07-07 ENCOUNTER — Encounter: Payer: Medicare Other | Admitting: Physical Therapy

## 2022-07-07 ENCOUNTER — Encounter: Payer: Self-pay | Admitting: Physical Therapy

## 2022-07-07 DIAGNOSIS — M25561 Pain in right knee: Secondary | ICD-10-CM | POA: Diagnosis not present

## 2022-07-07 DIAGNOSIS — R262 Difficulty in walking, not elsewhere classified: Secondary | ICD-10-CM | POA: Diagnosis not present

## 2022-07-07 DIAGNOSIS — I700 Atherosclerosis of aorta: Secondary | ICD-10-CM | POA: Diagnosis not present

## 2022-07-07 DIAGNOSIS — R600 Localized edema: Secondary | ICD-10-CM

## 2022-07-07 DIAGNOSIS — G8929 Other chronic pain: Secondary | ICD-10-CM

## 2022-07-07 DIAGNOSIS — M6281 Muscle weakness (generalized): Secondary | ICD-10-CM

## 2022-07-07 DIAGNOSIS — R911 Solitary pulmonary nodule: Secondary | ICD-10-CM

## 2022-07-07 NOTE — Therapy (Addendum)
OUTPATIENT PHYSICAL THERAPY TREATMENT  / DISCHARGE  Patient Name: Kathy Cook MRN: [id number] DOB:December 31, 1945, 77 y.o., female Today's Date: 07/07/2022  END OF SESSION:  PT End of Session - 07/07/22 1019     Visit Number 13    Number of Visits 15    Date for PT Re-Evaluation 07/20/22    Authorization Type [employer] MCR    Progress Note Due on Visit 10    PT Start Time 0925    PT Stop Time 1010    PT Time Calculation (min) 45 min    Activity Tolerance Patient tolerated treatment well    Behavior During Therapy WFL for tasks assessed/performed                 Past Medical History:  Diagnosis Date   Allergy    Anxiety    Attention deficit disorder (ADD)    Cancer (HCC)    ? basal/squam cell on chest, and forehead   Depression    Dry eyes    GERD (gastroesophageal reflux disease)    diet controlled, no meds   HLD (hyperlipidemia)    HSV infection    Lumbar degenerative disc disease    Ocular rosacea    Osteopenia    Personal history of radiation therapy    Past Surgical History:  Procedure Laterality Date   BREAST BIOPSY Left 05/28/2019   BREAST LUMPECTOMY Left 07/18/2019   Procedure: LEFT BREAST LUMPECTOMY;  Surgeon: Abigail Miyamoto, MD;  Location: [facility];  Service: General;  Laterality: Left;  LMA   COLONOSCOPY  2019   RE-EXCISION OF BREAST LUMPECTOMY Left 09/05/2019   Procedure: RE-EXCISION OF LEFT BREAST TUMOR;  Surgeon: Abigail Miyamoto, MD;  Location: [facility];  Service: General;  Laterality: Left;  LMA   SKIN BIOPSY     ?basal/squam cell on chest   TONSILLECTOMY AND ADENOIDECTOMY     TOTAL KNEE ARTHROPLASTY Right 03/04/2019   Procedure: RIGHT TOTAL KNEE ARTHROPLASTY;  Surgeon: Eldred Manges, MD;  Location: [facility] OR;  Service: Orthopedics;  Laterality: Right;   UPPER GI ENDOSCOPY     Patient Active Problem List   Diagnosis Date Noted   Quadriceps weakness 05/17/2022   Acute anal fissure 05/17/2020   Phyllodes tumor of breast 08/07/2019    Ductal carcinoma in situ (DCIS) of left breast 08/06/2019   Status post total hip replacement, right 03/05/2019   Arthritis of right knee 03/04/2019   Unilateral primary osteoarthritis, right knee 02/05/2019   Knee locking, right 01/09/2019   Metatarsal stress fracture, right, initial encounter 10/03/2016   DJD (degenerative joint disease) 06/09/2016   Labile hypertension 08/01/2014   Vitamin D deficiency 08/01/2014   Medication management 08/01/2014   Obesity 07/04/2014   Hyperlipemia 06/27/2013   Attention deficit disorder (ADD)    GERD (gastroesophageal reflux disease)    Allergy    Depression, major, in remission (HCC)    Anemia    Ocular rosacea    HSV infection    Osteopenia     PCP: Lucky Cowboy, MD   REFERRING PROVIDER: Eldred Manges, MD,  REFERRING DIAG:  [phone] (ICD-10-CM) - Chronic pain of right knee  M62.81 (ICD-10-CM) - Quadriceps weakness    THERAPY DIAG:  Chronic pain of right knee  Muscle weakness (generalized)  Difficulty in walking, not elsewhere classified  Localized edema  Rationale for Evaluation and Treatment: Rehabilitation  ONSET DATE: Rt TKA 03/04/19 with residual quad weakness  SUBJECTIVE:   SUBJECTIVE STATEMENT: She relays her  knee is feeling pretty good, she will hold PT to go on vacation for 3 weeks  PERTINENT HISTORY:PMH: Rt TKA 03/04/19, Ca,lumbar DDD, osteopenia,  PAIN:  NPRS scale: 1/10 Pain location: Rt knee Pain description: sorness Aggravating factors:  Relieving factors:   PRECAUTIONS: None  WEIGHT BEARING RESTRICTIONS: No  FALLS:  Has patient fallen in last 6 months? No  OCCUPATION:   PLOF: Independent  PATIENT GOALS: improve strength and walk longer as she has trip coming up to [place] end of May.   NEXT MD VISIT: nothing scheduled  OBJECTIVE:   DIAGNOSTIC FINDINGS:  05/25/2022 Result Date: 05/17/2022 Standing AP both knees lateral right knee and sunrise patella x-ray demonstrates satisfactory  right total knee arthroplasty without loosening or subsidence.  Opposite left knee shows mild arthritic changes. Impression: Satisfactory right total knee arthroplasty unchanged from January 2021 images.   PATIENT SURVEYS:  05/25/2022 FOTO wasn't set up for her so will omit  COGNITION: 05/25/2022 Overall cognitive status: Within functional limits for tasks assessed     SENSATION: 05/25/2022 She reports light sensation still diminished some around Rt knee  EDEMA:  05/25/2022 Eval: mild edema in Rt knee  MUSCLE LENGTH: 05/25/2022 Tight quads on Rt  POSTURE:   PALPATION:   LOWER EXTREMITY ROM:  Active ROM/PROM Right 05/25/2022 Right 06/27/2022  Hip flexion    Hip extension    Hip abduction    Hip adduction    Hip internal rotation    Hip external rotation    Knee flexion 115/120 118/  Knee extension 0   Ankle dorsiflexion    Ankle plantarflexion    Ankle inversion    Ankle eversion     (Blank rows = not tested)  LOWER EXTREMITY MMT:  MMT Left 05/25/2022 Right 05/25/2022 Right 06/27/22  Hip flexion 4 4+ 5-  Hip extension     Hip abduction 4 4+ 5-  Hip adduction     Hip internal rotation     Hip external rotation     Knee flexion 5 5 5   Knee extension 4 43# 4 39# 4+ 47#  Ankle dorsiflexion     Ankle plantarflexion     Ankle inversion     Ankle eversion      (Blank rows = not tested)  LOWER EXTREMITY SPECIAL TESTS:    FUNCTIONAL TESTS:  05/25/2022: 5 times sit to stand: 8.9 seconds without UE support SLS avg of 6 seconds bilat  06/27/22 SLS avg 13.3 seconds, was able to perform 20+ seconds at best  GAIT:Eval Comments: WFL gait, independent community Ambulator but does have pain with prolonged walking  TODAY'S TREATMENT:  07/07/22  TherEx  Recumbent bike L5 X 10 minutes   Shuttle LE press power up/slow down 156# 2 X 10 reps BLEs Single LE press power up/slow down 100# 2 x10 B  Leg extension machine Rt leg only 10# 2X15 then DL 69# X 7 (stopped  due to pain) Leg curl machine DL 48# [id number] Sidestepping with with green band around ankles and squat each rep at counter top 3 round trips Monster walking with with green band around ankles and squat each rep at counter top 3 round trips at bars Single leg stance 1 minute X 3  07/04/22  TherEx  Recumbent bike L 3 X 10 minutes   Shuttle LE press power up/slow down 156# 2 X 10 reps BLEs Single LE press power up/slow down 100# 2 x10 B  Leg extension machine Rt leg only 10# 2X15 then  DL 60# X 7 (stopped due to pain) Leg curl machine DL 63# [id number] TRX lunges (held today due to knee soreness with them) TRX squats with 3 sec hold 2 X 10 reps Sidestepping with with green band around ankles and squat each rep at counter top 3 round trips Monster walking with with green band around ankles and squat each rep at counter top 3 round trips at bars Single leg stance 20 sec X 3 bilat     PATIENT EDUCATION: 05/31/2022 Education details: HEP update Person educated: Patient Education method: Programmer, multimedia, Demonstration, Verbal cues, and Handouts Education comprehension: verbalized understanding and needs further education   HOME EXERCISE PROGRAM: Access Code: [email or web address] URL: [email or web address] Date: 05/25/2022 Prepared by: Ivery Quale  Exercises - Standing Quad Stretch with Strap  - 1 x daily - 6 x weekly - 1 sets - 3 reps - 30 sec hold - Standing Partial Lunge  - 1 x daily - 6 x weekly - 2 sets - 10 reps - Step Up  - 1 x daily - 6 x weekly - 1 sets - 10 reps - Sit to Stand Without Arm Support  - 1 x daily - 6 x weekly - 1-2 sets - 10 reps - Full Leg Press  - 1 x daily - 6 x weekly - 2-3 sets - 10-20 reps - Single Leg Press  - 1 x daily - 6 x weekly - 2-3 sets - 10-20 reps - Single Leg Knee Extension with Weight Machine  - 1 x daily - 6 x weekly - 2-3 sets - 10-20 reps  ASSESSMENT:  CLINICAL IMPRESSION:  She did great with PT and is at a good functional level with her knee. We  will place on hold after that as she will be out of town for 3 weeks  OBJECTIVE IMPAIRMENTS: decreased activity tolerance, difficulty walking, decreased balance, decreased endurance, decreased mobility, decreased ROM, decreased strength, impaired flexibility, impaired LE use, postural dysfunction, and pain.  ACTIVITY LIMITATIONS: bending, lifting, carry, locomotion, cleaning, community activity, driving, and or occupation  PERSONAL FACTORS: PMH: Rt TKA 03/04/19, Ca,lumbar DDD, osteopenia, are also affecting patient's functional outcome.  REHAB POTENTIAL: Good  CLINICAL DECISION MAKING: Stable/uncomplicated  EVALUATION COMPLEXITY: Low    GOALS: Short term PT Goals Target date: 06/22/2022   Pt will be I and compliant with HEP. Baseline:  Goal status: MET 06/27/2022 Pt will decrease pain by 25% overall Baseline: Goal status: MET 06/27/2022  Long term PT goals Target date:07/20/2022 Pt will improve  hip/knee strength to at least 5-/5 MMT to improve functional strength Baseline: Goal status: MET 05/31/2022 Pt will reduce pain to overall less than 2-3/10 with usual activity, walking, standing Baseline: Goal status: MET 05/31/2022 Pt will be able to ambulate community distances at least 1000 ft WNL gait pattern without complaints and navigate one flight of stairs reciprocal without complaints Baseline: Goal status: on going 06/27/2022, still some difficulty with longer distances and stairs at times  PLAN: PT FREQUENCY: 1-3 times per week   PT DURATION: 8 weeks  PLANNED INTERVENTIONS (unless contraindicated): aquatic PT, Canalith repositioning, cryotherapy, Electrical stimulation, Iontophoresis with 4 mg/ml dexamethasome, Moist heat, traction, Ultrasound, gait training, Therapeutic exercise, balance training, neuromuscular re-education, patient/family education, prosthetic training, manual techniques, passive ROM, dry needling, taping, vasopnuematic device, vestibular, spinal manipulations,  joint manipulations  PLAN FOR NEXT SESSION:  Hold PT after next session as she will miss 3 weeks traveling.  Ivery Quale, PT, DPT 07/07/22 10:20 AM   PHYSICAL THERAPY  DISCHARGE SUMMARY  Visits from Start of Care: 13  Current functional level related to goals / functional outcomes: See note   Remaining deficits: See note   Education / Equipment: HEP  Patient goals were  mostly met . Patient is being discharged due to not returning since the last visit.   Chyrel Masson, PT, DPT, OCS, ATC 09/13/22  3:26 PM

## 2022-07-14 ENCOUNTER — Other Ambulatory Visit: Payer: Self-pay | Admitting: *Deleted

## 2022-07-14 DIAGNOSIS — R911 Solitary pulmonary nodule: Secondary | ICD-10-CM

## 2022-08-10 ENCOUNTER — Ambulatory Visit: Payer: Medicare Other | Admitting: Emergency Medicine

## 2022-08-10 ENCOUNTER — Encounter: Payer: Self-pay | Admitting: Emergency Medicine

## 2022-08-10 VITALS — BP 134/72 | HR 79 | Temp 99.8°F | Ht 62.5 in | Wt 207.6 lb

## 2022-08-10 DIAGNOSIS — R911 Solitary pulmonary nodule: Secondary | ICD-10-CM | POA: Insufficient documentation

## 2022-08-10 NOTE — Progress Notes (Signed)
Subjective:    Patient ID: Kathy Cook, female    DOB: 12/24/1945, 77 y.o.   MRN: [id number]  HPI 77 year old former smoker (10-15 pack years) with a history of allergies, GERD, hyperlipidemia, skin cancer on the chest and forehead, L breast CA in 2021 (lumpectomy, XRT, tamoxifen).  She is referred today for pulmonary nodule noted on CT scan of the chest.  She underwent a cardiac scoring CT chest 06/2021 that showed a 7 x 6 mm pulmonary nodule the medial aspect left lower lobe and a few other scattered nodules.  This prompted repeat CT chest as below  CT scan of the chest 07/07/2022 reviewed by me, shows no mediastinal or hilar adenopathy, increase in size of the left lower lobe pulmonary nodule now 1.3 x 0.7 cm.  The other scattered small nodules are unchanged.   Review of Systems As per HPI  Past Medical History:  Diagnosis Date   Allergy    Anxiety    Attention deficit disorder (ADD)    Cancer (HCC)    ? basal/squam cell on chest, and forehead   Depression    Dry eyes    GERD (gastroesophageal reflux disease)    diet controlled, no meds   HLD (hyperlipidemia)    HSV infection    Lumbar degenerative disc disease    Ocular rosacea    Osteopenia    Personal history of radiation therapy      Family History  Problem Relation Age of Onset   Diabetes Father    Hypertension Mother    Depression Mother    Macular degeneration Mother    Heart disease Mother      Social History   Socioeconomic History   Marital status: Married    Spouse name: Not on file   Number of children: Not on file   Years of education: Not on file   Highest education level: Not on file  Occupational History   Not on file  Tobacco Use   Smoking status: Former    Packs/day: 0.50    Years: 15.00    Additional pack years: 0.00    Total pack years: 7.50    Types: Cigarettes    Quit date: 03/23/1980    Years since quitting: 42.4   Smokeless tobacco: Never  Vaping Use   Vaping Use: Never used   Substance and Sexual Activity   Alcohol use: Yes    Alcohol/week: 0.0 standard drinks of alcohol    Comment: social   Drug use: No   Sexual activity: Yes    Birth control/protection: Post-menopausal  Other Topics Concern   Not on file  Social History Narrative   Not on file   Social Determinants of Health   Financial Resource Strain: Not on file  Food Insecurity: Not on file  Transportation Needs: Not on file  Physical Activity: Not on file  Stress: Not on file  Social Connections: Not on file  Intimate Partner Violence: Not on file    From [place] 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    Allergies  Allergen Reactions   Zetia [Ezetimibe] Diarrhea    Patient states she had IBS, that took a year to resolve.   Prednisone Nausea And Vomiting   Statins Other (See Comments)    paralyzing   Strattera [Atomoxetine Hcl]     Increased sadness     Outpatient Medications Prior to Visit  Medication Sig Dispense Refill   amphetamine-dextroamphetamine (ADDERALL)  30 MG tablet Take 1/2 to 1 tablet 2 x /day as needed for Focus & Concentration TEVA BRAND ONLY 60 tablet 0   celecoxib (CELEBREX) 200 MG capsule Take 200 mg by mouth 2 (two) times daily.     Cholecalciferol (DIALYVITE VITAMIN D 5000) 125 MCG (5000 UT) capsule Take 10,000 Units by mouth daily.     famciclovir (FAMVIR) 500 MG tablet Take 1 tablet (500 mg total) by mouth 2 (two) times daily as needed (fever blisters). 60 tablet 1   No facility-administered medications prior to visit.        Objective:   Physical Exam  Vitals:   08/10/22 1401  BP: 134/72  Pulse: 79  Temp: 99.8 F (37.7 C)  TempSrc: Oral  SpO2: 97%  Weight: 207 lb 9.6 oz (94.2 kg)  Height: 5' 2.5" (1.588 m)   Gen: Pleasant, well-nourished, in no distress,  normal affect  ENT: No lesions,  mouth clear,  oropharynx clear, no postnasal drip  Neck: No JVD, no stridor  Lungs: No use of accessory muscles, no  crackles or wheezing on normal respiration, no wheeze on forced expiration  Cardiovascular: RRR, heart sounds normal, no murmur or gallops, no peripheral edema  Musculoskeletal: No deformities, no cyanosis or clubbing  Neuro: alert, awake, non focal  Skin: Warm, no lesions or rash     Assessment & Plan:  Pulmonary nodule Irregular medial left lower lobe pulmonary nodule.  May be slightly larger on repeat CT chest on 07/07/2022.  By my measurements it looks quite similar to the index scan from 06/2021.  We talked about the differential diagnosis today.  She does have a history of tobacco use and breast cancer.  We will perform a PET scan to better characterize the nodule.  If there is suspicion for possible malignancy then we will arrange for navigational bronchoscopy versus primary resection.  With a history of breast cancer, tissue diagnosis would probably be preferred for surgical removal.   Levy Pupa, MD, PhD 08/10/2022, 2:47 PM [facility] [phone] or if no answer before 7:00PM call [phone] For any issues after 7:00PM please call eLink [phone]

## 2022-08-10 NOTE — Assessment & Plan Note (Signed)
Irregular medial left lower lobe pulmonary nodule.  May be slightly larger on repeat CT chest on 07/07/2022.  By my measurements it looks quite similar to the index scan from 06/2021.  We talked about the differential diagnosis today.  She does have a history of tobacco use and breast cancer.  We will perform a PET scan to better characterize the nodule.  If there is suspicion for possible malignancy then we will arrange for navigational bronchoscopy versus primary resection.  With a history of breast cancer, tissue diagnosis would probably be preferred for surgical removal.

## 2022-08-10 NOTE — Patient Instructions (Signed)
We reviewed your CT scans of the chest today. We will arrange for a PET scan to better characterize a small left lower lobe pulmonary nodule Will arrange for pulmonary function testing at some point in the future. Follow Dr. Delton Coombes next available after the PET scan so we can review those results together.

## 2022-08-22 ENCOUNTER — Telehealth: Payer: Self-pay | Admitting: Emergency Medicine

## 2022-08-22 NOTE — Telephone Encounter (Signed)
PT had to resched w/Dr. Delton Coombes to rev PET scan results. Based on those results will she still need the PFT? Please call to advise @ [phone]

## 2022-08-25 NOTE — Telephone Encounter (Signed)
Called patient but she did not answer. Left message for her to call us back.  

## 2022-08-26 NOTE — Telephone Encounter (Signed)
Please try again so I may close encounter. Thank you.

## 2022-09-05 ENCOUNTER — Ambulatory Visit (HOSPITAL_COMMUNITY)
Admission: RE | Admit: 2022-09-05 | Discharge: 2022-09-05 | Disposition: A | Discharge status: Home / Self Care | Payer: Medicare Other | Source: Ambulatory Visit | Attending: Emergency Medicine | Admitting: Emergency Medicine

## 2022-09-05 DIAGNOSIS — R911 Solitary pulmonary nodule: Secondary | ICD-10-CM | POA: Diagnosis not present

## 2022-09-05 DIAGNOSIS — R918 Other nonspecific abnormal finding of lung field: Secondary | ICD-10-CM | POA: Diagnosis not present

## 2022-09-05 LAB — GLUCOSE, CAPILLARY: Glucose-Capillary: 103 mg/dL — ABNORMAL HIGH (ref 70–99)

## 2022-09-05 MED ORDER — FLUDEOXYGLUCOSE F - 18 (FDG) INJECTION
10.4000 | Freq: Once | INTRAVENOUS | Status: AC
  Administered 2022-09-05: 10.31 via INTRAVENOUS

## 2022-09-06 ENCOUNTER — Ambulatory Visit: Payer: Medicare Other | Admitting: Nurse Practitioner

## 2022-09-14 ENCOUNTER — Ambulatory Visit: Payer: Medicare Other | Admitting: Emergency Medicine

## 2022-09-14 ENCOUNTER — Telehealth: Payer: Self-pay | Admitting: Emergency Medicine

## 2022-09-14 NOTE — Telephone Encounter (Signed)
[phone] Please call w/PET scan results.

## 2022-09-16 NOTE — Telephone Encounter (Signed)
Dr. Delton Coombes can you please advise on PET scan results?

## 2022-09-19 NOTE — Telephone Encounter (Signed)
PLease let the patient know that her PET scan does not show any significant metabolic activity in her pulmonary nodule. I would still like to see her as planned in August so we can review her scan, her PFT and to discuss next steps in her evaluation.

## 2022-09-21 NOTE — Telephone Encounter (Signed)
Called an adv PT on scan. She expressed understanding. NFN

## 2022-09-27 DIAGNOSIS — Z853 Personal history of malignant neoplasm of breast: Secondary | ICD-10-CM | POA: Diagnosis not present

## 2022-09-27 DIAGNOSIS — M8588 Other specified disorders of bone density and structure, other site: Secondary | ICD-10-CM | POA: Diagnosis not present

## 2022-09-28 ENCOUNTER — Encounter: Payer: Self-pay | Admitting: Nurse Practitioner

## 2022-09-28 ENCOUNTER — Ambulatory Visit (INDEPENDENT_AMBULATORY_CARE_PROVIDER_SITE_OTHER): Payer: Medicare Other | Admitting: Nurse Practitioner

## 2022-09-28 VITALS — BP 144/80 | HR 60 | Temp 97.7°F | Ht 62.5 in | Wt 202.8 lb

## 2022-09-28 DIAGNOSIS — F325 Major depressive disorder, single episode, in full remission: Secondary | ICD-10-CM

## 2022-09-28 DIAGNOSIS — D649 Anemia, unspecified: Secondary | ICD-10-CM

## 2022-09-28 DIAGNOSIS — L718 Other rosacea: Secondary | ICD-10-CM

## 2022-09-28 DIAGNOSIS — M150 Primary generalized (osteo)arthritis: Secondary | ICD-10-CM

## 2022-09-28 DIAGNOSIS — E782 Mixed hyperlipidemia: Secondary | ICD-10-CM

## 2022-09-28 DIAGNOSIS — R7309 Other abnormal glucose: Secondary | ICD-10-CM | POA: Diagnosis not present

## 2022-09-28 DIAGNOSIS — M159 Polyosteoarthritis, unspecified: Secondary | ICD-10-CM

## 2022-09-28 DIAGNOSIS — M8580 Other specified disorders of bone density and structure, unspecified site: Secondary | ICD-10-CM | POA: Diagnosis not present

## 2022-09-28 DIAGNOSIS — Z0000 Encounter for general adult medical examination without abnormal findings: Secondary | ICD-10-CM

## 2022-09-28 DIAGNOSIS — R6889 Other general symptoms and signs: Secondary | ICD-10-CM | POA: Diagnosis not present

## 2022-09-28 DIAGNOSIS — E66812 Obesity, class 2: Secondary | ICD-10-CM

## 2022-09-28 DIAGNOSIS — D0512 Intraductal carcinoma in situ of left breast: Secondary | ICD-10-CM

## 2022-09-28 DIAGNOSIS — D4860 Neoplasm of uncertain behavior of unspecified breast: Secondary | ICD-10-CM

## 2022-09-28 DIAGNOSIS — R0989 Other specified symptoms and signs involving the circulatory and respiratory systems: Secondary | ICD-10-CM

## 2022-09-28 DIAGNOSIS — F988 Other specified behavioral and emotional disorders with onset usually occurring in childhood and adolescence: Secondary | ICD-10-CM

## 2022-09-28 DIAGNOSIS — Z0001 Encounter for general adult medical examination with abnormal findings: Secondary | ICD-10-CM

## 2022-09-28 DIAGNOSIS — K219 Gastro-esophageal reflux disease without esophagitis: Secondary | ICD-10-CM

## 2022-09-28 DIAGNOSIS — Z79899 Other long term (current) drug therapy: Secondary | ICD-10-CM | POA: Diagnosis not present

## 2022-09-28 DIAGNOSIS — T7840XD Allergy, unspecified, subsequent encounter: Secondary | ICD-10-CM

## 2022-09-28 DIAGNOSIS — E559 Vitamin D deficiency, unspecified: Secondary | ICD-10-CM

## 2022-09-28 DIAGNOSIS — Z6836 Body mass index (BMI) 36.0-36.9, adult: Secondary | ICD-10-CM

## 2022-09-28 DIAGNOSIS — B009 Herpesviral infection, unspecified: Secondary | ICD-10-CM

## 2022-09-28 LAB — CBC WITH DIFFERENTIAL/PLATELET
Absolute Monocytes: 561 cells/uL (ref 200–950)
Basophils Absolute: 49 cells/uL (ref 0–200)
Basophils Relative: 0.8 %
Eosinophils Absolute: 189 cells/uL (ref 15–500)
Eosinophils Relative: 3.1 %
HCT: 41.6 % (ref 35.0–45.0)
Hemoglobin: 13.8 g/dL (ref 11.7–15.5)
Lymphs Abs: 1104 cells/uL (ref 850–3900)
MCH: 31.7 pg (ref 27.0–33.0)
MCHC: 33.2 g/dL (ref 32.0–36.0)
MCV: 95.6 fL (ref 80.0–100.0)
MPV: 10.7 fL (ref 7.5–12.5)
Monocytes Relative: 9.2 %
Neutro Abs: 4197 cells/uL (ref 1500–7800)
Neutrophils Relative %: 68.8 %
Platelets: 204 10*3/uL (ref 140–400)
RBC: 4.35 10*6/uL (ref 3.80–5.10)
RDW: 12.1 % (ref 11.0–15.0)
Total Lymphocyte: 18.1 %
WBC: 6.1 10*3/uL (ref 3.8–10.8)

## 2022-09-28 NOTE — Progress Notes (Signed)
MEDICARE WELLNESS and follow up MEDICARE visit.  Assessment:   Encounter for Medicare annual wellness exam Due annually  Health maintenance reviewed Healthily lifestyle goals set  Depression, major, in remission (HCC) Continue medications.   Reviewed relaxation techniques.  Sleep hygiene. Recommended Cognitive Behavioral Therapy (CBT). Recommended mindfulness meditation and exercise.   Insight-oriented psychotherapy given for 16 minutes exclusively. Psychoeducation:  encouraged personality growth wand development through coping techniques and problem-solving skills. Limit/Decrease/Monitor drug/alcohol intake.     Phyllodes tumor of breast S/p lumpectomy with radiation  Ductal carcinoma in situ (DCIS) of left breast S/p lumpectomy with radiation  Labile hypertension Discussed DASH (Dietary Approaches to Stop Hypertension) DASH diet is lower in sodium than a typical American diet. Cut back on foods that are high in saturated fat, cholesterol, and trans fats. Eat more whole-grain foods, fish, poultry, and nuts Remain active and exercise as tolerated daily.  Monitor BP at home-Call if greater than 130/80.  Check CMP/CBC  Hyperlipidemia, unspecified hyperlipidemia type Discussed lifestyle modifications. Recommended diet heavy in fruits and veggies, omega 3's. Decrease consumption of animal meats, cheeses, and dairy products. Remain active and exercise as tolerated. Continue to monitor. Check lipids/TSH  Medication management All medications discussed and reviewed in full. All questions and concerns regarding medications addressed.    Vitamin D deficiency Continue supplement  Anemia, unspecified type Monitor, continue iron supp with Vitamin C and increase green leafy veggies and lean red meat if consumed.  Osteopenia, unspecified location Monitor Continue conservative management PRN  Ocular rosacea Monitor  Class 2 obesity due to excess calories with serious  comorbidity  Plans to start [facility] - will pick up Rx today. Discussed appropriate BMI Diet modification. Physical activity. Encouraged/praised to build confidence.  Attention deficit disorder, unspecified hyperactivity presence Continue Adderall  Gastroesophageal reflux disease, unspecified whether esophagitis present No suspected reflux complications (Barret/stricture). Lifestyle modification:  wt loss, avoid meals 2-3h before bedtime. Consider eliminating food triggers:  chocolate, caffeine, EtOH, acid/spicy food.  Allergy, subsequent encounter Allergic rhinitis Allegra OTC, increase H20, allergy hygiene explained.  HSV I infection Monitor  Primary osteoarthritis involving multiple joints Continue follow up ortho  Vitamin D deficiency Continue supplement for goal of 60-100 Monitor Vitamin D levels  Abnormal glucose/prediabetes Education: Reviewed 'ABCs' of diabetes management  Discussed goals to be met and/or maintained include A1C (<7) Blood pressure (<130/80) Cholesterol (LDL <70) Continue Eye Exam yearly  Continue Dental Exam Q6 mo Discussed dietary recommendations Discussed Physical Activity recommendations Check A1C  Orders Placed This Encounter  Procedures   CBC with Differential/Platelet   COMPLETE METABOLIC PANEL WITH GFR   Lipid panel   Hemoglobin A1C w/out eAG   VITAMIN D 25 Hydroxy (Vit-D Deficiency, Fractures)    Notify office for further evaluation and treatment, questions or concerns if any reported s/s fail to improve.   The patient was advised to call back or seek an in-person evaluation if any symptoms worsen or if the condition fails to improve as anticipated.   Further disposition pending results of labs. Discussed med's effects and SE's.    I discussed the assessment and treatment plan with the patient. The patient was provided an opportunity to ask questions and all were answered. The patient agreed with the plan and demonstrated an  understanding of the instructions.  Discussed med's effects and SE's. Screening labs and tests as requested with regular follow-up as recommended.  I provided 35 minutes of face-to-face time during this encounter including counseling, chart review, and critical decision making was  preformed.  Today's Plan of Care is based on a patient-centered health care approach known as shared decision making - the decisions, tests and treatments allow for patient preferences and values to be balanced with clinical evidence.    Future Appointments  Date Time Provider Department Center  10/13/2022 11:00 AM [facility] None  10/18/2022 10:00 AM Leslye Peer, MD [facility] None  12/08/2022  2:30 PM Lucky Cowboy, MD [facility] None  09/29/2023 11:30 AM Adela Glimpse, NP [facility] None    Medicare Attestation I have personally reviewed: The patient's medical and social history Their use of alcohol, tobacco or illicit drugs Their current medications and supplements The patient's functional ability including ADLs,fall risks, home safety risks, cognitive, and hearing and visual impairment Diet and physical activities Evidence for depression or mood disorders  The patient's weight, height, BMI, and visual acuity have been recorded in the chart.  I have made referrals, counseling, and provided education to the patient based on review of the above and I have provided the patient with a written personalized care plan for preventive services.    MEDICARE WELLNESS OBJECTIVES: Physical activity: Current Exercise Habits: Structured exercise class Cardiac risk factors:   Depression/mood screen:      09/28/2022   12:32 PM  Depression screen PHQ 2/9  Decreased Interest 0  Down, Depressed, Hopeless 0  PHQ - 2 Score 0    ADLs:     09/28/2022   12:33 PM 05/31/2022   12:07 AM  In your present state of health, do you have any difficulty performing the following activities:  Hearing? 0 0   Vision? 0 0  Difficulty concentrating or making decisions? 0 0  Walking or climbing stairs? 0 0  Dressing or bathing? 0 0  Doing errands, shopping? 0 0     Cognitive Testing  Alert? Yes  Normal Appearance?Yes  Oriented to person? Yes  Place? Yes   Time? Yes  Recall of three objects?  Yes  Can perform simple calculations? Yes  Displays appropriate judgment?Yes  Can read the correct time from a watch face?Yes  EOL planning: Does Patient Have a Medical Advance Directive?: Yes Type of Advance Directive: Living will    Subjective:   Kathy Cook is a 76 y.o. female who presents for Medicare wellness and 3 month follow up on hypertension, hyperlipidemia, vitamin D def.   BMI is Body mass index is 36.5 kg/m., she is working on diet and exercise. Wt Readings from Last 3 Encounters:  09/28/22 202 lb 12.8 oz (92 kg)  08/10/22 207 lb 9.6 oz (94.2 kg)  05/31/22 207 lb 6.4 oz (94.1 kg)   She is S/p lumpectomy with radiation on her left breast, no longer on hormones due to DC in situ, started on effexor 37.5mg  by Dr. Arlice Colt.   She does well on the Adderall, helps with concentration however has not been taking.  Has HSV 1 and has famcyclovir for occ out break.    Her blood pressure has been controlled at home, today their BP is BP: (!) 144/80 She does workout, walks. She denies chest pain, shortness of breath, dizziness.  She is not on cholesterol medication, can not tolerate statins and could not tolerate zetia due to diarrhea. No family history.  Her cholesterol is not at goal. The cholesterol last visit was:   Lab Results  Component Value Date   CHOL 326 (H) 11/23/2021   HDL 86 11/23/2021   LDLCALC 198 (H) 11/23/2021   TRIG  220 (H) 11/23/2021   CHOLHDL 3.8 11/23/2021   Last U9W in the office was:  Lab Results  Component Value Date   HGBA1C 5.9 (H) 05/31/2022   Patient is on Vitamin D supplement. Lab Results  Component Value Date   VD25OH 39 05/31/2022    Names of  Other Physician/Practitioners you currently use: 1. [facility]- here for primary care 2. [place], eye doctor, 07/2022 3.  Dr. Ninetta Lights, dentist, 07/2022 last visit q 6 months  Patient Care Team: Lucky Cowboy, MD as PCP - General (Internal Medicine) Marcelle Overlie, MD as Consulting Physician (Obstetrics and Gynecology) Ernesto Rutherford, MD as Consulting Physician (Ophthalmology) Leta Speller, MD as Consulting Physician (Dermatology) Magrinat, Valentino Hue, MD (Inactive) as Consulting Physician (Oncology) Abigail Miyamoto, MD as Consulting Physician (General Surgery) Lonie Peak, MD as Attending Physician (Radiation Oncology) Eldred Manges, MD as Consulting Physician (Orthopedic Surgery) Pershing Proud, RN as Oncology Nurse Navigator Donnelly Angelica, RN as Oncology Nurse Navigator  Medication Review     Current Outpatient Medications (Analgesics):    celecoxib (CELEBREX) 200 MG capsule, Take 200 mg by mouth 2 (two) times daily.   Current Outpatient Medications (Other):    amphetamine-dextroamphetamine (ADDERALL) 30 MG tablet, Take 1/2 to 1 tablet 2 x /day as needed for Focus & Concentration TEVA BRAND ONLY   Cholecalciferol (DIALYVITE VITAMIN D 5000) 125 MCG (5000 UT) capsule, Take 10,000 Units by mouth daily.   famciclovir (FAMVIR) 500 MG tablet, Take 1 tablet (500 mg total) by mouth 2 (two) times daily as needed (fever blisters).  Current Problems (verified) Patient Active Problem List   Diagnosis Date Noted   Pulmonary nodule 08/10/2022   Quadriceps weakness 05/17/2022   Acute anal fissure 05/17/2020   Phyllodes tumor of breast 08/07/2019   Ductal carcinoma in situ (DCIS) of left breast 08/06/2019   Status post total hip replacement, right 03/05/2019   Arthritis of right knee 03/04/2019   Unilateral primary osteoarthritis, right knee 02/05/2019   Knee locking, right 01/09/2019   Metatarsal stress fracture, right, initial encounter  10/03/2016   DJD (degenerative joint disease) 06/09/2016   Labile hypertension 08/01/2014   Vitamin D deficiency 08/01/2014   Medication management 08/01/2014   Obesity 07/04/2014   Hyperlipemia 06/27/2013   Attention deficit disorder (ADD)    GERD (gastroesophageal reflux disease)    Allergy    Depression, major, in remission (HCC)    Anemia    Ocular rosacea    HSV infection    Osteopenia     Screening Tests Immunization History  Administered Date(s) Administered   DT (Pediatric) 01/14/2015   Influenza, High Dose Seasonal PF 04/30/2018, 11/13/2018, 01/06/2020   PFIZER(Purple Top)SARS-COV-2 Vaccination 04/07/2019, 05/01/2019   Pneumococcal Conjugate-13 03/24/2015   Pneumococcal-Unspecified 12/31/2012   Td 03/29/2004    Preventative care: Last colonoscopy: 06/2017 last colonoscopy per Dr. Loreta Ave Last mammogram: 07/2021 at GYN  Last pap smear/pelvic exam: 2023 at G[facility] Dr. Milton Ferguson DEXA 08/2022 Osteopenia  Prior vaccinations: TD or Tdap: 2016  Influenza: 2023 Pneumococcal: 2014 Prevnar 13: 2017 Shingles/Zostavax: DUE  Allergies Allergies  Allergen Reactions   Zetia [Ezetimibe] Diarrhea    Patient states she had IBS, that took a year to resolve.   Prednisone Nausea And Vomiting   Statins Other (See Comments)    paralyzing   Strattera [Atomoxetine Hcl]     Increased sadness    SURGICAL HISTORY She  has a past surgical history that includes Tonsillectomy and adenoidectomy; Skin biopsy; Total  knee arthroplasty (Right, 03/04/2019); Breast lumpectomy (Left, 07/18/2019); Colonoscopy (2019); Upper gi endoscopy; Re-excision of breast lumpectomy (Left, 09/05/2019); and Breast biopsy (Left, 05/28/2019). FAMILY HISTORY Her family history includes Depression in her mother; Diabetes in her father; Heart disease in her mother; Hypertension in her mother; Macular degeneration in her mother. SOCIAL HISTORY She  reports that she quit smoking about 42 years ago. Her smoking use  included cigarettes. She started smoking about 57 years ago. She has a 7.5 pack-year smoking history. She has never used smokeless tobacco. She reports current alcohol use. She reports that she does not use drugs.  Review of Systems  Constitutional: Negative.   HENT: Negative.    Eyes: Negative.   Respiratory: Negative.    Cardiovascular: Negative.   Gastrointestinal: Negative.   Genitourinary: Negative.   Musculoskeletal: Negative.   Skin: Negative.   Neurological:  Positive for tingling (back worse with stress). Negative for dizziness, tremors, sensory change, speech change, focal weakness, seizures, loss of consciousness and headaches.     Objective:   Blood pressure (!) 144/80, pulse 60, temperature 97.7 F (36.5 C), height 5' 2.5" (1.588 m), weight 202 lb 12.8 oz (92 kg), SpO2 98%. Body mass index is 36.5 kg/m.  General appearance: alert, no distress, WD/WN,  female HEENT: normocephalic, sclerae anicteric, pupils dilated at this time due to recent eye exam TMs pearly, nares patent, no discharge or erythema, pharynx normal Oral cavity: MMM, no lesions Neck: supple, no lymphadenopathy, no thyromegaly, no masses Heart: RRR, normal S1, S2, no murmurs Lungs: CTA bilaterally, no wheezes, rhonchi, or rales Abdomen: +bs, soft, non tender, non distended, no masses, no hepatomegaly, no splenomegaly Musculoskeletal: nontender, no swelling, no obvious deformity Extremities: no edema, no cyanosis, no clubbing Pulses: 2+ symmetric, upper and lower extremities, normal cap refill Neurological: alert, oriented x 3, CN2-12 intact, strength normal upper extremities and lower extremities, sensation normal throughout, DTRs 2+ throughout, no cerebellar signs, gait normal Psychiatric: normal affect, behavior normal, pleasant  Breast: Left breast with large 2-3 cm mobile non tender mass at 12 oclock.  Gyn: defer Rectal: defer    Adela Glimpse, NP   09/28/2022

## 2022-09-29 ENCOUNTER — Telehealth: Payer: Self-pay | Admitting: Nurse Practitioner

## 2022-09-29 NOTE — Telephone Encounter (Signed)
Patient would like to know how you feel about a compounding Semaglutide.

## 2022-09-29 NOTE — Telephone Encounter (Signed)
Pt wants to speak to nurse or provider about [employer]. Supposedly, Tonya and her spoke about it at her last appt.

## 2022-09-30 NOTE — Telephone Encounter (Signed)
Patient aware.

## 2022-10-06 ENCOUNTER — Other Ambulatory Visit: Payer: Self-pay | Admitting: Nurse Practitioner

## 2022-10-06 ENCOUNTER — Telehealth: Payer: Self-pay | Admitting: Nurse Practitioner

## 2022-10-06 DIAGNOSIS — F988 Other specified behavioral and emotional disorders with onset usually occurring in childhood and adolescence: Secondary | ICD-10-CM

## 2022-10-06 MED ORDER — AMPHETAMINE-DEXTROAMPHETAMINE 30 MG PO TABS
ORAL_TABLET | ORAL | 0 refills | Status: DC
Start: 2022-10-06 — End: 2023-01-19

## 2022-10-06 NOTE — Telephone Encounter (Signed)
PT SAW Kathy Cook LAST WEEK AND SHE NEEDED HER ADDERALL REFILLED AND SHE CALLED THE PHARM AND IT HASN'T BEEN DONE- WILL YOU PLEASE CALL IN HER REFILL TO [facility] ON [address]

## 2022-10-12 ENCOUNTER — Other Ambulatory Visit: Payer: Self-pay

## 2022-10-12 DIAGNOSIS — R911 Solitary pulmonary nodule: Secondary | ICD-10-CM

## 2022-10-13 ENCOUNTER — Ambulatory Visit (INDEPENDENT_AMBULATORY_CARE_PROVIDER_SITE_OTHER): Payer: Medicare Other | Admitting: Emergency Medicine

## 2022-10-13 DIAGNOSIS — R911 Solitary pulmonary nodule: Secondary | ICD-10-CM

## 2022-10-13 LAB — PULMONARY FUNCTION TEST
DL/VA % pred: 119 %
DL/VA: 4.99 ml/min/mmHg/L
DLCO cor % pred: 70 %
DLCO cor: 12.71 ml/min/mmHg
DLCO unc % pred: 71 %
DLCO unc: 12.86 ml/min/mmHg
FEF 25-75 Post: 2.91 L/s
FEF 25-75 Pre: 1.93 L/s
FEF2575-%Change-Post: 50 %
FEF2575-%Pred-Post: 182 %
FEF2575-%Pred-Pre: 120 %
FEV1-%Change-Post: 9 %
FEV1-%Pred-Post: 120 %
FEV1-%Pred-Pre: 110 %
FEV1-Post: 2.36 L
FEV1-Pre: 2.16 L
FEV1FVC-%Change-Post: 6 %
FEV1FVC-%Pred-Pre: 106 %
FEV6-%Change-Post: 2 %
FEV6-%Pred-Post: 112 %
FEV6-%Pred-Pre: 109 %
FEV6-Post: 2.79 L
FEV6-Pre: 2.71 L
FEV6FVC-%Pred-Post: 105 %
FEV6FVC-%Pred-Pre: 105 %
FVC-%Change-Post: 2 %
FVC-%Pred-Post: 106 %
FVC-%Pred-Pre: 103 %
FVC-Post: 2.79 L
FVC-Pre: 2.71 L
Post FEV1/FVC ratio: 85 %
Post FEV6/FVC ratio: 100 %
Pre FEV1/FVC ratio: 80 %
Pre FEV6/FVC Ratio: 100 %
RV % pred: 50 %
RV: 1.09 L
TLC % pred: 139 %
TLC: 6.61 L

## 2022-10-13 NOTE — Progress Notes (Signed)
Full PFT performed today. °

## 2022-10-13 NOTE — Patient Instructions (Signed)
Full PFT performed today. °

## 2022-10-18 ENCOUNTER — Ambulatory Visit: Payer: Medicare Other | Admitting: Emergency Medicine

## 2022-12-08 ENCOUNTER — Ambulatory Visit: Payer: Medicare Other | Admitting: Internal Medicine

## 2023-01-03 DIAGNOSIS — Z713 Dietary counseling and surveillance: Secondary | ICD-10-CM | POA: Diagnosis not present

## 2023-01-19 ENCOUNTER — Encounter: Payer: Self-pay | Admitting: Internal Medicine

## 2023-01-19 ENCOUNTER — Ambulatory Visit (INDEPENDENT_AMBULATORY_CARE_PROVIDER_SITE_OTHER): Payer: Medicare Other | Admitting: Internal Medicine

## 2023-01-19 VITALS — BP 120/72 | HR 75 | Temp 97.9°F | Resp 17 | Ht 62.5 in | Wt 198.6 lb

## 2023-01-19 DIAGNOSIS — Z789 Other specified health status: Secondary | ICD-10-CM | POA: Diagnosis not present

## 2023-01-19 DIAGNOSIS — E559 Vitamin D deficiency, unspecified: Secondary | ICD-10-CM

## 2023-01-19 DIAGNOSIS — Z23 Encounter for immunization: Secondary | ICD-10-CM

## 2023-01-19 DIAGNOSIS — R7309 Other abnormal glucose: Secondary | ICD-10-CM | POA: Diagnosis not present

## 2023-01-19 DIAGNOSIS — Z79899 Other long term (current) drug therapy: Secondary | ICD-10-CM

## 2023-01-19 DIAGNOSIS — R0989 Other specified symptoms and signs involving the circulatory and respiratory systems: Secondary | ICD-10-CM | POA: Diagnosis not present

## 2023-01-19 DIAGNOSIS — E782 Mixed hyperlipidemia: Secondary | ICD-10-CM | POA: Diagnosis not present

## 2023-01-19 DIAGNOSIS — F900 Attention-deficit hyperactivity disorder, predominantly inattentive type: Secondary | ICD-10-CM

## 2023-01-19 MED ORDER — AMPHETAMINE-DEXTROAMPHETAMINE 30 MG PO TABS
ORAL_TABLET | ORAL | 0 refills | Status: DC
Start: 2023-01-19 — End: 2023-04-17

## 2023-01-19 NOTE — Progress Notes (Signed)
[facility]  Lucky Cowboy, M.D.        Rance Muir, D.NP      Adela Glimpse, D.NP  [facility] [address]  [place], [place]. [address] Telephone [phone] Telefax [phone]  Future Appointments  Date Time Provider Department  01/19/2023               6 mo ov  11:00 AM Lucky Cowboy, MD [facility]  09/29/2023               wellness  11:30 AM Adela Glimpse, NP [facility]    History of Present Illness:      This very nice 77 y.o. MWF presents for 6 month follow up with HTN, HLD, Pre-Diabetes and Vitamin D Deficiency. Patient has hx/o Depression in remission on Effexor. Chest CT scan in May 2023 showed Aortic Atherosclerosis & a 7 x 6 mm LLL nodule and Dr Rennis Golden scheduled a f/u Chest CT  in May 2024. Then Patient had a NM PET  in mid July per Dr Levy Pupa  suggesting f/u Chest CT scan in  3-6 months,         Patient is followed expectantly for labile  HTN  since  & BP has been controlled at home. Today's BP is at goal - 120/72. Patient has had no complaints of any cardiac type chest pain, palpitations, dyspnea Pollyann Kennedy /PND, dizziness, claudication  or dependent edema.        Patient is Statin  & Ezetimibe intolerant.   Hyperlipidemia is not controlled with diet.  Patient is also followed by Dr Rennis Golden in the [facility]id Clinic. Patient denies myalgias or other med SE's. Last Lipids were not at goal :  Lab Results  Component Value Date   CHOL 287 (H) 09/28/2022   HDL 76 09/28/2022   LDLCALC 170 (H) 09/28/2022   TRIG 240 (H) 09/28/2022   CHOLHDL 3.8 09/28/2022     Also, the patient has Patient has hx/o moderate obesity  (BMI 34+)  and is monitored expectantly for glucose intolerance.  She  has had no symptoms of reactive hypoglycemia, diabetic polys, paresthesias or visual blurring.  Last A1c was  near goal :  Lab Results  Component Value Date   HGBA1C 5.9 (H) 09/28/2022                                                         Further, the patient also has history of Vitamin D Deficiency and supplements vitamin D . Last vitamin D was at goal :  Lab Results  Component Value Date   VD25OH 79 09/28/2022     Current Outpatient Medications on File Prior to Visit  Medication Sig   ADDERALL 30 MG tablet Take 1/2 to 1 tablet 2 x /day as needed for Focus & Concentration TEVA BRAND ONLY   celecoxib 200 MG capsule Take 200 mg   2  times daily.   Cholecalciferol  5000 u  Take 10,000  u  daily.   famciclovir 500 MG tablet Take 1 tablet  2  times daily as needed      Allergies  Allergen Reactions   Zetia [Ezetimibe] Diarrhea    Patient states she had IBS, that took a year to resolve.   Prednisone Nausea And  Vomiting   Statins Other (See Comments)    paralyzing   Strattera [Atomoxetine Hcl]     Increased sadness     PMHx:   Past Medical History:  Diagnosis Date   Allergy    Anxiety    Attention deficit disorder (ADD)    Cancer (HCC)    ? basal/squam cell on chest, and forehead   Depression    Dry eyes    GERD (gastroesophageal reflux disease)    diet controlled, no meds   HLD (hyperlipidemia)    HSV infection    Lumbar degenerative disc disease    Ocular rosacea    Osteopenia    Personal history of radiation therapy      Immunization History  Administered Date(s) Administered   DT (Pediatric) 01/14/2015   Influenza, High Dose Seasonal PF 04/30/2018, 11/13/2018, 01/06/2020   PFIZER(Purple Top)SARS-COV-2 Vaccination 04/07/2019, 05/01/2019   Pneumococcal Conjugate-13 03/24/2015   Pneumococcal-Unspecified 12/31/2012   Td 03/29/2004     Past Surgical History:  Procedure Laterality Date   BREAST BIOPSY Left 05/28/2019   BREAST LUMPECTOMY Left 07/18/2019   Procedure: LEFT BREAST LUMPECTOMY;  Surgeon: Abigail Miyamoto, MD;  Location: [facility];  Service: General;  Laterality: Left;  LMA   COLONOSCOPY  2019   RE-EXCISION OF BREAST LUMPECTOMY Left  09/05/2019   Procedure: RE-EXCISION OF LEFT BREAST TUMOR;  Surgeon: Abigail Miyamoto, MD;  Location: [facility] OR;  Service: General;  Laterality: Left;  LMA   SKIN BIOPSY     ?basal/squam cell on chest   TONSILLECTOMY AND ADENOIDECTOMY     TOTAL KNEE ARTHROPLASTY Right 03/04/2019   Procedure: RIGHT TOTAL KNEE ARTHROPLASTY;  Surgeon: Eldred Manges, MD;  Location: [facility] OR;  Service: Orthopedics;  Laterality: Right;   UPPER GI ENDOSCOPY      FHx:    Reviewed / unchanged   SHx:    Reviewed / unchanged    Systems Review:  Constitutional: Denies fever, chills, wt changes, headaches, insomnia, fatigue, night sweats, change in appetite. Eyes: Denies redness, blurred vision, diplopia, discharge, itchy, watery eyes.  ENT: Denies discharge, congestion, post nasal drip, epistaxis, sore throat, earache, hearing loss, dental pain, tinnitus, vertigo, sinus pain, snoring.  CV: Denies chest pain, palpitations, irregular heartbeat, syncope, dyspnea, diaphoresis, orthopnea, PND, claudication or edema. Respiratory: denies cough, dyspnea, DOE, pleurisy, hoarseness, laryngitis, wheezing.  Gastrointestinal: Denies dysphagia, odynophagia, heartburn, reflux, water brash, abdominal pain or cramps, nausea, vomiting, bloating, diarrhea, constipation, hematemesis, melena, hematochezia  or hemorrhoids. Genitourinary: Denies dysuria, frequency, urgency, nocturia, hesitancy, discharge, hematuria or flank pain. Musculoskeletal: Denies arthralgias, myalgias, stiffness, jt. swelling, pain, limping or strain/sprain.  Skin: Denies pruritus, rash, hives, warts, acne, eczema or change in skin lesion(s). Neuro: No weakness, tremor, incoordination, spasms, paresthesia or pain. Psychiatric: Denies confusion, memory loss or sensory loss. Endo: Denies change in weight, skin or hair change.  Heme/Lymph: No excessive bleeding, bruising or enlarged lymph nodes.   Physical Exam  BP 120/72   Pulse 75   Temp 97.9 F (36.6 C)   Resp  17   Ht 5' 2.5" (1.588 m)   Wt 198 lb 9.6 oz (90.1 kg)   LMP  (LMP Unknown)   SpO2 98%   BMI 35.75 kg/m   Appears  well nourished, well groomed  and in no distress.  Eyes: PERRLA, EOMs, conjunctiva no swelling or erythema. Sinuses: No frontal/maxillary tenderness ENT/Mouth: EAC's clear, TM's nl w/o erythema, bulging. Nares clear w/o erythema, swelling, exudates. Oropharynx clear without erythema or exudates.  Oral hygiene is good. Tongue normal, non obstructing. Hearing intact.  Neck: Supple. Thyroid not palpable. Car 2+/2+ without bruits, nodes or JVD. Chest: Respirations nl with BS clear & equal w/o rales, rhonchi, wheezing or stridor.  Cor: Heart sounds normal w/ regular rate and rhythm without sig. murmurs, gallops, clicks or rubs. Peripheral pulses normal and equal  without edema.  Abdomen: Soft & bowel sounds normal. Non-tender w/o guarding, rebound, hernias, masses or organomegaly.  Lymphatics: Unremarkable.  Musculoskeletal: Full ROM all peripheral extremities, joint stability, 5/5 strength and normal gait.  Skin: Warm, dry without exposed rashes, lesions or ecchymosis apparent.  Neuro: Cranial nerves intact, reflexes equal bilaterally. Sensory-motor testing grossly intact. Tendon reflexes grossly intact.  Pysch: Alert & oriented x 3.  Insight and judgement nl & appropriate. No ideations.   Assessment and Plan:  1. Labile hypertension  - Continue medication, monitor blood pressure at home.  - Continue DASH diet.  Reminder to go to the ER if any CP,  SOB, nausea, dizziness, severe HA, changes vision/speech.    - CBC with Differential/Platelet - COMPLETE METABOLIC PANEL WITH GFR - Magnesium - TSH   2. Hyperlipidemia, mixed  - Continue diet/meds, exercise,& lifestyle modifications.  - Continue monitor periodic cholesterol/liver & renal functions     - TSH - Cardio IQ (R) Advanced Lipid Panel   3. Statin intolerance  - Cardio IQ (R) Advanced Lipid Panel   4.  Abnormal glucose  - Continue diet, exercise  - Lifestyle modifications.  - Monitor appropriate labs   - Hemoglobin A1c - Insulin, random   5. Vitamin D deficiency  - Continue supplementation   - VITAMIN D 25 Hydroxy    6. Medication management  - CBC with Differential/Platelet - COMPLETE METABOLIC PANEL WITH GFR - Magnesium - TSH - Hemoglobin A1c - Insulin, random - VITAMIN D 25 Hydroxy - Cardio IQ (R) Advanced Lipid Panel  7. Need for influenza vaccination  - Flu vaccine HIGH DOSE PF(Fluzone Trivalent)   8. Attention deficit hyperactivity disorder (ADHD)  -ADDERALL 30 MG tablet;  Take 1/2 to 1 tablet 2 x /day as needed for Focus & Concentration  TEVA BRAND ONLY  Dispense: 60 tablet; Refill: 0        Discussed  regular exercise, BP monitoring, weight control to achieve/maintain BMI less than 25 and discussed med and SE's. Recommended labs to assess /monitor clinical status .  I discussed the assessment and treatment plan with the patient. The patient was provided an opportunity to ask questions and all were answered. The patient agreed with the plan and demonstrated an understanding of the instructions.  I provided over 30 minutes of exam, counseling, chart review and  complex critical decision making.        The patient was advised to call back or seek an in-person evaluation if the symptoms worsen or if the condition fails to improve as anticipated.   Marinus Maw, MD

## 2023-01-19 NOTE — Patient Instructions (Signed)

## 2023-01-21 NOTE — Progress Notes (Signed)
<>*<>*<>*<>*<>*<>*<>*<>*<>*<>*<>*<>*<>*<>*<>*<>*<>*<>*<>*<>*<>*<>*<>*<>*<> <>*<>*<>*<>*<>*<>*<>*<>*<>*<>*<>*<>*<>*<>*<>*<>*<>*<>*<>*<>*<>*<>*<>*<>*<>  -  Test results slightly outside the reference range are not unusual. If there is anything important, I will review this with you,  otherwise it is considered normal test values.  If you have further questions,  please do not hesitate to contact me at the office or via My Chart.   <>*<>*<>*<>*<>*<>*<>*<>*<>*<>*<>*<>*<>*<>*<>*<>*<>*<>*<>*<>*<>*<>*<>*<>*<> <>*<>*<>*<>*<>*<>*<>*<>*<>*<>*<>*<>*<>*<>*<>*<>*<>*<>*<>*<>*<>*<>*<>*<>*<>   -  Kidney functions  look a little dehydrated ( GFR is decreased )  -  Very important to drink adequate amounts of fluids to prevent                                                                                              permanent damage    -  Recommend drink at least 6 bottles (16 ounces) of fluids /water /day                                                                                                   = 96 Oz ~100 oz  -  100 oz = 3,000 cc or 3 liters / day  -->>                                                   That's 1 &1/2 bottles of a 2 liter soda bottle /day!   <>*<>*<>*<>*<>*<>*<>*<>*<>*<>*<>*<>*<>*<>*<>*<>*<>*<>*<>*<>*<>*<>*<>*<>*<> <>*<>*<>*<>*<>*<>*<>*<>*<>*<>*<>*<>*<>*<>*<>*<>*<>*<>*<>*<>*<>*<>*<>*<>*<>  -  A1c = 5.6% - back in Normal  Non preDiabetic range  <>*<>*<>*<>*<>*<>*<>*<>*<>*<>*<>*<>*<>*<>*<>*<>*<>*<>*<>*<>*<>*<>*<>*<>*<> <>*<>*<>*<>*<>*<>*<>*<>*<>*<>*<>*<>*<>*<>*<>*<>*<>*<>*<>*<>*<>*<>*<>*<>*<>  -  Vitamin D = 80 - Excellent  - Please keep dosage same   <>*<>*<>*<>*<>*<>*<>*<>*<>*<>*<>*<>*<>*<>*<>*<>*<>*<>*<>*<>*<>*<>*<>*<>*<> <>*<>*<>*<>*<>*<>*<>*<>*<>*<>*<>*<>*<>*<>*<>*<>*<>*<>*<>*<>*<>*<>*<>*<>*<>  -  All Else - CBC - Kidneys - Electrolytes - Liver - Magnesium & Thyroid    - all  Normal / OK  - Cardio IQ Lipid panel usually takes about 2 weeks to come  back  <>*<>*<>*<>*<>*<>*<>*<>*<>*<>*<>*<>*<>*<>*<>*<>*<>*<>*<>*<>*<>*<>*<>*<>*<> <>*<>*<>*<>*<>*<>*<>*<>*<>*<>*<>*<>*<>*<>*<>*<>*<>*<>*<>*<>*<>*<>*<>*<>*<>

## 2023-01-27 LAB — COMPLETE METABOLIC PANEL WITH GFR
AG Ratio: 2 (calc) (ref 1.0–2.5)
ALT: 15 U/L (ref 6–29)
AST: 18 U/L (ref 10–35)
Albumin: 4.5 g/dL (ref 3.6–5.1)
Alkaline phosphatase (APISO): 75 U/L (ref 37–153)
BUN: 17 mg/dL (ref 7–25)
CO2: 30 mmol/L (ref 20–32)
Calcium: 9.5 mg/dL (ref 8.6–10.4)
Chloride: 101 mmol/L (ref 98–110)
Creat: 1 mg/dL (ref 0.60–1.00)
Globulin: 2.2 g/dL (ref 1.9–3.7)
Glucose, Bld: 87 mg/dL (ref 65–99)
Potassium: 4.4 mmol/L (ref 3.5–5.3)
Sodium: 140 mmol/L (ref 135–146)
Total Bilirubin: 0.6 mg/dL (ref 0.2–1.2)
Total Protein: 6.7 g/dL (ref 6.1–8.1)
eGFR: 59 mL/min/{1.73_m2} — ABNORMAL LOW (ref >=60)

## 2023-01-27 LAB — CBC WITH DIFFERENTIAL/PLATELET
Absolute Lymphocytes: 1132 {cells}/uL (ref 850–3900)
Absolute Monocytes: 621 {cells}/uL (ref 200–950)
Basophils Absolute: 41 {cells}/uL (ref 0–200)
Basophils Relative: 0.6 %
Eosinophils Absolute: 179 {cells}/uL (ref 15–500)
Eosinophils Relative: 2.6 %
HCT: 40.9 % (ref 35.0–45.0)
Hemoglobin: 13.7 g/dL (ref 11.7–15.5)
MCH: 31.8 pg (ref 27.0–33.0)
MCHC: 33.5 g/dL (ref 32.0–36.0)
MCV: 94.9 fL (ref 80.0–100.0)
MPV: 10.5 fL (ref 7.5–12.5)
Monocytes Relative: 9 %
Neutro Abs: 4927 {cells}/uL (ref 1500–7800)
Neutrophils Relative %: 71.4 %
Platelets: 226 10*3/uL (ref 140–400)
RBC: 4.31 10*6/uL (ref 3.80–5.10)
RDW: 11.8 % (ref 11.0–15.0)
Total Lymphocyte: 16.4 %
WBC: 6.9 10*3/uL (ref 3.8–10.8)

## 2023-01-27 LAB — HEMOGLOBIN A1C
Hgb A1c MFr Bld: 5.6 %{Hb} (ref <5.7)
Mean Plasma Glucose: 114 mg/dL
eAG (mmol/L): 6.3 mmol/L

## 2023-01-27 LAB — CARDIO IQ(R) ADVANCED LIPID PANEL
Apolipoprotein B: 141 mg/dL — ABNORMAL HIGH (ref <90)
Cholesterol: 290 mg/dL — ABNORMAL HIGH (ref <200)
HDL LARGE: 10119 nmol/L (ref >6729)
HDL: 82 mg/dL (ref >49)
LDL Cholesterol (Calc): 171 mg/dL — ABNORMAL HIGH (ref <100)
LDL Medium: 704 nmol/L — ABNORMAL HIGH (ref <215)
LDL Particle Number: 3041 nmol/L — ABNORMAL HIGH (ref <1138)
LDL Peak Size: 221.7 Angstrom — ABNORMAL LOW (ref >222.9)
LDL Small: 473 nmol/L — ABNORMAL HIGH (ref <142)
Lipoprotein (a): 10 nmol/L (ref <75)
Non-HDL Cholesterol (Calc): 208 mg/dL — ABNORMAL HIGH (ref <130)
Total CHOL/HDL Ratio: 3.5 calc (ref <5.0)
Triglycerides: 221 mg/dL — ABNORMAL HIGH (ref <150)

## 2023-01-27 LAB — TSH: TSH: 3.16 m[IU]/L (ref 0.40–4.50)

## 2023-01-27 LAB — VITAMIN D 25 HYDROXY (VIT D DEFICIENCY, FRACTURES): Vit D, 25-Hydroxy: 80 ng/mL (ref 30–100)

## 2023-01-27 LAB — MAGNESIUM: Magnesium: 2.3 mg/dL (ref 1.5–2.5)

## 2023-01-27 LAB — INSULIN, RANDOM: Insulin: 10.2 u[IU]/mL

## 2023-01-28 ENCOUNTER — Other Ambulatory Visit: Payer: Self-pay | Admitting: Internal Medicine

## 2023-01-28 DIAGNOSIS — E782 Mixed hyperlipidemia: Secondary | ICD-10-CM

## 2023-01-28 DIAGNOSIS — Z789 Other specified health status: Secondary | ICD-10-CM

## 2023-01-28 NOTE — Progress Notes (Signed)
[][][][][][][][][][][][][][][][][][][][][][][][][][][][][][][][][][][][][][][][][]][][][][][][][][][][][][][][][][][][][][][][][[][][][][] [][][][][][][][][][][][][][][][][][][][][][][][][][][][][][][][][][][][][][][][][]][][][][][][][][][][][][][][][][][][][][][][][[][][][][]    Cardio IQ panel has finally returned & fortunately did show   - Lipoprotein (a)  (aka LP "little a")  did return low & very favorable , But    - other parameters - Apolipoprotein B , Total Chol , LDL & Triglycerides   did all return in high risk categories, So  - Since you are Intolerant to Statins & Zetia , I have requested a referral to   see Dr Rennis Golden who is in charge of the lipid clinic at [facility] for   recommendations for therapy.  [] [] [] [] [] [] [] [] [] [] [] [] [] [] [] [] [] [] [] [] [] [] [] [] [] [] [] [] [] [] [] [] [] [] [] [] [] [] [] [] [] ][] [] [] [] [] [] [] [] [] [] [] [] [] [] [] [] [] [] [] [] [] [] [[] [] [] [] []  [] [] [] [] [] [] [] [] [] [] [] [] [] [] [] [] [] [] [] [] [] [] [] [] [] [] [] [] [] [] [] [] [] [] [] [] [] [] [] [] [] ][] [] [] [] [] [] [] [] [] [] [] [] [] [] [] [] [] [] [] [] [] [] [[] [] [] [] []

## 2023-03-02 ENCOUNTER — Encounter: Payer: Self-pay | Admitting: Internal Medicine

## 2023-03-02 ENCOUNTER — Ambulatory Visit: Payer: Medicare Other | Attending: Internal Medicine | Admitting: Internal Medicine

## 2023-03-02 VITALS — BP 126/76 | HR 77 | Ht 62.0 in | Wt 196.4 lb

## 2023-03-02 DIAGNOSIS — I2510 Atherosclerotic heart disease of native coronary artery without angina pectoris: Secondary | ICD-10-CM | POA: Diagnosis not present

## 2023-03-02 DIAGNOSIS — E7800 Pure hypercholesterolemia, unspecified: Secondary | ICD-10-CM | POA: Diagnosis not present

## 2023-03-02 DIAGNOSIS — M791 Myalgia, unspecified site: Secondary | ICD-10-CM | POA: Diagnosis not present

## 2023-03-02 DIAGNOSIS — R911 Solitary pulmonary nodule: Secondary | ICD-10-CM

## 2023-03-02 DIAGNOSIS — I700 Atherosclerosis of aorta: Secondary | ICD-10-CM | POA: Diagnosis not present

## 2023-03-02 DIAGNOSIS — T466X5D Adverse effect of antihyperlipidemic and antiarteriosclerotic drugs, subsequent encounter: Secondary | ICD-10-CM | POA: Diagnosis not present

## 2023-03-02 NOTE — Patient Instructions (Addendum)
 Medication Instructions:  Dr. Mona recommends Repatha  Sureclick 140mg /mL (PCSK9). This is an injectable cholesterol medication self-administered once every 14 days. This medication will likely need prior approval with your insurance company, which we will work on. If the medication is not approved initially, we may need to do an appeal with your insurance.   Administer medication in area of fatty tissue such as abdomen, outer thigh, back of upper arm - and rotate site with each injection Store medication in refrigerator until ready to administer - allow to sit at room temp for 30 mins - 1 hour prior to injection Dispose of medication in a SHARPS container - your pharmacy should be able to direct you on this and proper disposal   If you need a co-pay card for Repatha : https://[email or web address].repatha .com/repatha -cost If you need a co-pay card for Praluent: [email or web address]  Patient Assistance:    These foundations have funds at various times.   The [employer] Foundation: https://www.panfoundation.org/disease-funds/hypercholesterolemia/ -- can sign up for wait list  The [facility] offers assistance to help pay for medication copays.  They will cover copays for all cholesterol lowering meds, including statins, fibrates, omega-3 fish oils like Vascepa, ezetimibe , Repatha , Praluent, Nexletol , Nexlizet .  The cards are usually good for $2,500 or 12 months, whichever comes first. Our fax # is [phone] (you will need this to apply) Go to healthwellfoundation.org Click on "Apply Now" Answer questions as to whom is applying (patient or representative) Your disease fund will be "hypercholesterolemia - Medicare access" They will ask questions about finances and which medications you are taking for cholesterol When you submit, the approval is usually within minutes.  You will need to print the card information from the site You will need to show this information to your pharmacy,  they will bill your Medicare Part D plan first -then bill Health Well --for the copay.   You can also call them at [phone], although the hold times can be quite long.     *If you need a refill on your cardiac medications before your next appointment, please call your pharmacy*   Lab Work: FASTING lab work in 3-4 months  ** complete about ONE WEEK before next visit  If you have labs (blood work) drawn today and your tests are completely normal, you will receive your results only by: MyChart Message (if you have MyChart) OR A paper copy in the mail If you have any lab test that is abnormal or we need to change your treatment, we will call you to review the results.  Testing: Chest CT at [facility]  Follow-Up: At [facility], you and your health needs are our priority.  As part of our continuing mission to provide you with exceptional heart care, we have created designated Provider Care Teams.  These Care Teams include your primary Cardiologist (physician) and Advanced Practice Providers (APPs -  Physician Assistants and Nurse Practitioners) who all work together to provide you with the care you need, when you need it.  We recommend signing up for the patient portal called MyChart.  Sign up information is provided on this After Visit Summary.  MyChart is used to connect with patients for Virtual Visits (Telemedicine).  Patients are able to view lab/test results, encounter notes, upcoming appointments, etc.  Non-urgent messages can be sent to your provider as well.   To learn more about what you can do with MyChart, go to [email or web address].    Your next appointment:    3-4 months with Dr. Mona

## 2023-03-02 NOTE — Progress Notes (Signed)
 LIPID CLINIC CONSULT NOTE  Chief Complaint:  Manage dyslipidemia  Primary Care Physician: Tonita Fallow, MD  Primary Cardiologist:  None  HPI:  Kathy Cook is a 78 y.o. female who is being seen today for the evaluation of dyslipidemia at the request of Tonita Fallow, MD. This is a 78 year old female with a history of longstanding dyslipidemia that has not necessarily improved with diet and weight loss in the past.  She has no history of early onset heart disease.  She is tried statins in the past and had significant side effects with it.  She also took Zetia  which caused severe IBS symptoms which lasted for about a year.  Subsequently she has not been on any lipid-lowering therapy recently.  She reports having had a look into her high cholesterol in the past and had specifically particle testing which showed she had large buoyant particles, suggestive of lower risk dyslipidemia.  She has had longstanding high HDL cholesterol.  Triglycerides have also been initiated more recently were elevated.  The most recent labs show total cholesterol 242, HDL 74, triglycerides 368 and LDL 115.  Lipids from 2 years ago however showed total cholesterol over 300 with HDL 100 and LDL 186 calculated.  03/02/2023  Ms. Kaatz returns today for follow-up.  I actually saw her back in 2023.  After reviewing her dyslipidemia was not clear that she may have had a high risk lipid profile however I did recommend a calcium score to further evaluate it.  She did have some coronary calcification at 16.7 which was 43rd percentile.  Overall not high risk.  She was noted to have aortic atherosclerosis and a left lower lobe pulmonary nodule.  She had a follow-up evaluation in May 2024 which showed interval increase in size of her pulmonary nodule.  Based on that she was referred to pulmonary.  She had a PET scan in July 2024.  This was indeterminant with mild hypermetabolism.  She was seen by Dr. Shelah who felt that  he was not concerned about malignancy and recommended repeat imaging in 3 to 6 months however that has not yet been arranged.  She did have a recent cardio IQ panel.  This showed a total cholesterol 290, HDL 82, triglycerides 221 and LDL 171.  LDL particle #3041 primarily large and medium size particles.  There was a mild increase in small LDL particles.  APO B was elevated 141.  LP(a) was negative at 10.    PMHx:  Past Medical History:  Diagnosis Date   Allergy    Anxiety    Attention deficit disorder (ADD)    Cancer (HCC)    ? basal/squam cell on chest, and forehead   Depression    Dry eyes    GERD (gastroesophageal reflux disease)    diet controlled, no meds   HLD (hyperlipidemia)    HSV infection    Lumbar degenerative disc disease    Ocular rosacea    Osteopenia    Personal history of radiation therapy     Past Surgical History:  Procedure Laterality Date   BREAST BIOPSY Left 05/28/2019   BREAST LUMPECTOMY Left 07/18/2019   Procedure: LEFT BREAST LUMPECTOMY;  Surgeon: Vernetta Berg, MD;  Location: [facility];  Service: General;  Laterality: Left;  LMA   COLONOSCOPY  2019   RE-EXCISION OF BREAST LUMPECTOMY Left 09/05/2019   Procedure: RE-EXCISION OF LEFT BREAST TUMOR;  Surgeon: Vernetta Berg, MD;  Location: [facility] OR;  Service: General;  Laterality: Left;  LMA   SKIN BIOPSY     ?basal/squam cell on chest   TONSILLECTOMY AND ADENOIDECTOMY     TOTAL KNEE ARTHROPLASTY Right 03/04/2019   Procedure: RIGHT TOTAL KNEE ARTHROPLASTY;  Surgeon: Barbarann Oneil BROCKS, MD;  Location: [facility] OR;  Service: Orthopedics;  Laterality: Right;   UPPER GI ENDOSCOPY      FAMHx:  Family History  Problem Relation Age of Onset   Diabetes Father    Hypertension Mother    Depression Mother    Macular degeneration Mother    Heart disease Mother     SOCHx:   reports that she quit smoking about 42 years ago. Her smoking use included cigarettes. She started smoking about 57 years ago.  She has a 7.5 pack-year smoking history. She has never used smokeless tobacco. She reports current alcohol use. She reports that she does not use drugs.  ALLERGIES:  Allergies  Allergen Reactions   Zetia  [Ezetimibe ] Diarrhea    Patient states she had IBS, that took a year to resolve.   Prednisone  Nausea And Vomiting   Statins Other (See Comments)    paralyzing   Strattera [Atomoxetine Hcl]     Increased sadness    ROS: Pertinent items noted in HPI and remainder of comprehensive ROS otherwise negative.  HOME MEDS: Current Outpatient Medications on File Prior to Visit  Medication Sig Dispense Refill   amphetamine -dextroamphetamine  (ADDERALL) 30 MG tablet Take 1/2 to 1 tablet 2 x /day as needed for Focus & Concentration TEVA BRAND ONLY 60 tablet 0   celecoxib (CELEBREX) 200 MG capsule Take 200 mg by mouth 2 (two) times daily.     Cholecalciferol (DIALYVITE VITAMIN D  5000) 125 MCG (5000 UT) capsule Take 10,000 Units by mouth daily.     famciclovir  (FAMVIR ) 500 MG tablet Take 1 tablet (500 mg total) by mouth 2 (two) times daily as needed (fever blisters). 60 tablet 1   Semaglutide-Weight Management (WEGOVY) 0.25 MG/0.5ML SOAJ Inject 0.25 mg into the skin once a week.     No current facility-administered medications on file prior to visit.    LABS/IMAGING: No results found for this or any previous visit (from the past 48 hours). No results found.  LIPID PANEL:    Component Value Date/Time   CHOL 290 (H) 01/19/2023 1055   TRIG 221 (H) 01/19/2023 1055   HDL 82 01/19/2023 1055   CHOLHDL 3.5 01/19/2023 1055   VLDL 46 (H) 10/04/2016 1005   LDLCALC 171 (H) 01/19/2023 1055    WEIGHTS: Wt Readings from Last 3 Encounters:  03/02/23 196 lb 6.4 oz (89.1 kg)  01/19/23 198 lb 9.6 oz (90.1 kg)  09/28/22 202 lb 12.8 oz (92 kg)    VITALS: BP 126/76 (BP Location: Left Arm, Patient Position: Sitting, Cuff Size: Normal)   Pulse 77   Ht 5' 2 (1.575 m)   Wt 196 lb 6.4 oz (89.1 kg)   LMP   (LMP Unknown)   SpO2 97%   BMI 35.92 kg/m   EXAM: Deferred  EKG: Deferred  ASSESSMENT: Mixed dyslipidemia with high triglycerides, possible familial hyperlipidemia CAC score of 16.7, 43rd percentile for age and sex matched controls (06/2021) Aortic atherosclerosis Statin and ezetimibe  intolerant  PLAN: 1.   Mrs. Spatz does have some coronary calcification although not high risk findings but also has aortic atherosclerosis.  Her lipid profile shows excessive LDL numbers with a high particle number although LP(a) was negative.  APO B is elevated at 141.  I  do feel that given her cardiovascular disease that treatment would be warranted.  Based on those findings, a good option for her's PCSK9 inhibitor therapy.  Would proceed for prior authorization with Repatha .  In addition since she is due for follow-up CT scan of her pulmonary nodule, will order CT scan of the chest without contrast.  She should have a follow-up with Dr. Shelah afterwards.  Plan follow-up with me in about 3 to 4 months with a repeat NMR.  Vinie KYM Maxcy, MD, [facility], FACP  [place]  [facility]  Medical Director of the Advanced Lipid Disorders &  Cardiovascular Risk Reduction Clinic Diplomate of the American Board of Clinical Lipidology Attending Cardiologist  Direct Dial: [phone]  Fax: [phone]  Website:  [email or web address]  Vinie BROCKS Zunairah Devers 03/02/2023, 2:10 PM

## 2023-03-03 ENCOUNTER — Other Ambulatory Visit (HOSPITAL_COMMUNITY): Payer: Self-pay

## 2023-03-03 ENCOUNTER — Telehealth: Payer: Self-pay | Admitting: Pharmacy Technician

## 2023-03-03 NOTE — Telephone Encounter (Signed)
-----   Message from Nurse Eileen Stanford E sent at 03/02/2023  2:44 PM EST ----- Regarding: PA for Repatha 140mg /mL Hey team  This patient needs a PA for Repatha  LDL >190  Thanks

## 2023-03-03 NOTE — Telephone Encounter (Signed)
 Update sent to patient via MyChart

## 2023-03-03 NOTE — Telephone Encounter (Signed)
 Pharmacy Patient Advocate Encounter   Received notification from Physician's Office that prior authorization for repatha  is required/requested.   Insurance verification completed.   The patient is insured through [facility] .   Per test claim: PA required; PA submitted to above mentioned insurance via CoverMyMeds Key/confirmation #/EOC [id number] Status is pending

## 2023-03-03 NOTE — Telephone Encounter (Signed)
 Pharmacy Patient Advocate Encounter  Received notification from [employer] that Prior Authorization for repatha  has been APPROVED from 03/03/23 to 08/31/23. Ran test claim, Copay is $302.00 one month (Deductible). This test claim was processed through [facility]- copay amounts may vary at other pharmacies due to pharmacy/plan contracts, or as the patient moves through the different stages of their insurance plan.   PA #/Case ID/Reference #: [id number]

## 2023-03-08 MED ORDER — REPATHA SURECLICK 140 MG/ML ~~LOC~~ SOAJ: 140.0000 mg | SUBCUTANEOUS | 3 refills | Status: DC

## 2023-03-08 NOTE — Addendum Note (Signed)
 Addended by: Lindell Spar on: 03/08/2023 03:37 PM   Modules accepted: Orders

## 2023-03-08 NOTE — Telephone Encounter (Signed)
 Rx(s) sent to pharmacy electronically.

## 2023-03-22 ENCOUNTER — Ambulatory Visit
Admission: RE | Admit: 2023-03-22 | Discharge: 2023-03-22 | Disposition: A | Discharge status: Home / Self Care | Payer: Medicare Other | Source: Ambulatory Visit | Attending: Internal Medicine | Admitting: Internal Medicine

## 2023-03-22 DIAGNOSIS — I700 Atherosclerosis of aorta: Secondary | ICD-10-CM | POA: Diagnosis not present

## 2023-03-22 DIAGNOSIS — R911 Solitary pulmonary nodule: Secondary | ICD-10-CM

## 2023-03-22 DIAGNOSIS — I2510 Atherosclerotic heart disease of native coronary artery without angina pectoris: Secondary | ICD-10-CM | POA: Diagnosis not present

## 2023-03-29 ENCOUNTER — Telehealth: Payer: Self-pay | Admitting: Internal Medicine

## 2023-03-29 NOTE — Telephone Encounter (Signed)
Patient calling in about her results for her CT. Please advise

## 2023-03-29 NOTE — Telephone Encounter (Signed)
Per chart review CT report not available  RN Noralee Chars radiology, radiology states CT has not been read, will place as STAT   ____________________________________________ Patient identification verified by 2 forms. Marilynn Rail, RN    Called and spoke to patient  Informed Kathy Cook CT results not available, once available she will be outreached Kathy Cook verbalized understanding, no questions at this time

## 2023-04-12 ENCOUNTER — Encounter: Payer: Self-pay | Admitting: Internal Medicine

## 2023-04-17 ENCOUNTER — Other Ambulatory Visit: Payer: Self-pay

## 2023-04-17 DIAGNOSIS — F900 Attention-deficit hyperactivity disorder, predominantly inattentive type: Secondary | ICD-10-CM

## 2023-04-17 MED ORDER — AMPHETAMINE-DEXTROAMPHETAMINE 30 MG PO TABS
ORAL_TABLET | ORAL | 0 refills | Status: DC
Start: 2023-04-17 — End: 2023-10-12

## 2023-04-25 DIAGNOSIS — H2513 Age-related nuclear cataract, bilateral: Secondary | ICD-10-CM | POA: Diagnosis not present

## 2023-04-25 DIAGNOSIS — H04123 Dry eye syndrome of bilateral lacrimal glands: Secondary | ICD-10-CM | POA: Diagnosis not present

## 2023-04-25 DIAGNOSIS — H353131 Nonexudative age-related macular degeneration, bilateral, early dry stage: Secondary | ICD-10-CM | POA: Diagnosis not present

## 2023-04-25 DIAGNOSIS — H532 Diplopia: Secondary | ICD-10-CM | POA: Diagnosis not present

## 2023-04-25 DIAGNOSIS — H5051 Esophoria: Secondary | ICD-10-CM | POA: Diagnosis not present

## 2023-04-25 DIAGNOSIS — H43812 Vitreous degeneration, left eye: Secondary | ICD-10-CM | POA: Diagnosis not present

## 2023-05-10 ENCOUNTER — Ambulatory Visit: Payer: Medicare Other | Admitting: Nurse Practitioner

## 2023-05-22 ENCOUNTER — Other Ambulatory Visit: Payer: Self-pay | Admitting: Internal Medicine

## 2023-05-22 DIAGNOSIS — Z9889 Other specified postprocedural states: Secondary | ICD-10-CM

## 2023-05-22 DIAGNOSIS — Z853 Personal history of malignant neoplasm of breast: Secondary | ICD-10-CM

## 2023-05-27 LAB — NMR, LIPOPROFILE
Cholesterol, Total: 221 mg/dL — ABNORMAL HIGH (ref 100–199)
HDL Particle Number: 38.4 umol/L (ref >=30.5)
HDL-C: 83 mg/dL (ref >39)
LDL Particle Number: 1095 nmol/L — ABNORMAL HIGH (ref <1000)
LDL Size: 21.8 nm (ref >20.5)
LDL-C (NIH Calc): 103 mg/dL — ABNORMAL HIGH (ref 0–99)
LP-IR Score: 29 (ref <=45)
Small LDL Particle Number: <90 nmol/L (ref <=527)
Triglycerides: 206 mg/dL — ABNORMAL HIGH (ref 0–149)

## 2023-05-27 LAB — APOLIPOPROTEIN B: Apolipoprotein B: 94 mg/dL — ABNORMAL HIGH (ref <90)

## 2023-06-01 ENCOUNTER — Encounter: Payer: Self-pay | Admitting: Internal Medicine

## 2023-06-01 ENCOUNTER — Ambulatory Visit: Payer: Medicare Other | Attending: Internal Medicine | Admitting: Internal Medicine

## 2023-06-01 VITALS — BP 122/80 | HR 46 | Ht 62.5 in | Wt 191.6 lb

## 2023-06-01 DIAGNOSIS — M791 Myalgia, unspecified site: Secondary | ICD-10-CM

## 2023-06-01 DIAGNOSIS — I2510 Atherosclerotic heart disease of native coronary artery without angina pectoris: Secondary | ICD-10-CM

## 2023-06-01 DIAGNOSIS — E7800 Pure hypercholesterolemia, unspecified: Secondary | ICD-10-CM

## 2023-06-01 DIAGNOSIS — T466X5A Adverse effect of antihyperlipidemic and antiarteriosclerotic drugs, initial encounter: Secondary | ICD-10-CM

## 2023-06-01 DIAGNOSIS — I700 Atherosclerosis of aorta: Secondary | ICD-10-CM

## 2023-06-01 DIAGNOSIS — T466X5D Adverse effect of antihyperlipidemic and antiarteriosclerotic drugs, subsequent encounter: Secondary | ICD-10-CM

## 2023-06-01 NOTE — Progress Notes (Signed)
 LIPID CLINIC CONSULT NOTE  Chief Complaint:  Manage dyslipidemia  Primary Care Physician: Lucky Cowboy, MD  Primary Cardiologist:  None  HPI:  Kathy Cook is a 78 y.o. female who is being seen today for the evaluation of dyslipidemia at the request of Lucky Cowboy, MD. This is a 78 year old female with a history of longstanding dyslipidemia that has not necessarily improved with diet and weight loss in the past.  She has no history of early onset heart disease.  She is tried statins in the past and had significant side effects with it.  She also took Zetia which caused severe IBS symptoms which lasted for about a year.  Subsequently she has not been on any lipid-lowering therapy recently.  She reports having had a look into her high cholesterol in the past and had specifically particle testing which showed she had "large buoyant particles", suggestive of lower risk dyslipidemia.  She has had longstanding high HDL cholesterol.  Triglycerides have also been initiated more recently were elevated.  The most recent labs show total cholesterol 242, HDL 74, triglycerides 368 and LDL 115.  Lipids from 2 years ago however showed total cholesterol over 300 with HDL 100 and LDL 186 calculated.  03/02/2023  Kathy Cook returns today for follow-up.  I actually saw her back in 2023.  After reviewing her dyslipidemia was not clear that she may have had a high risk lipid profile however I did recommend a calcium score to further evaluate it.  She did have some coronary calcification at 16.7 which was 43rd percentile.  Overall not high risk.  She was noted to have aortic atherosclerosis and a left lower lobe pulmonary nodule.  She had a follow-up evaluation in May 2024 which showed interval increase in size of her pulmonary nodule.  Based on that she was referred to pulmonary.  She had a PET scan in July 2024.  This was indeterminant with mild hypermetabolism.  She was seen by Dr. Delton Coombes who felt that  he was not concerned about malignancy and recommended repeat imaging in 3 to 6 months however that has not yet been arranged.  She did have a recent cardio IQ panel.  This showed a total cholesterol 290, HDL 82, triglycerides 221 and LDL 171.  LDL particle #3041 primarily large and medium size particles.  There was a mild increase in small LDL particles.  APO B was elevated 141.  LP(a) was negative at 10.    06/01/2023  Kathy Cook returns today for follow-up.  She has had a decent response to Repatha.  Although is not quite of 50 to 60% reduction her LDL particle numbers come down to 1095 from 3041.  LDL now 103 (down from 171), HDL 83 and triglycerides 206.  She reports her diet is ideal and does not know how it could be improved any further.  She also notes that she does get some activity but I have encouraged more regular exercise if possible to help with her triglycerides.  PMHx:  Past Medical History:  Diagnosis Date   Allergy    Anxiety    Attention deficit disorder (ADD)    Cancer (HCC)    ? basal/squam cell on chest, and forehead   Depression    Dry eyes    GERD (gastroesophageal reflux disease)    diet controlled, no meds   HLD (hyperlipidemia)    HSV infection    Lumbar degenerative disc disease    Ocular rosacea  Osteopenia    Personal history of radiation therapy     Past Surgical History:  Procedure Laterality Date   BREAST BIOPSY Left 05/28/2019   BREAST LUMPECTOMY Left 07/18/2019   Procedure: LEFT BREAST LUMPECTOMY;  Surgeon: Abigail Miyamoto, MD;  Location: [facility];  Service: General;  Laterality: Left;  LMA   COLONOSCOPY  2019   RE-EXCISION OF BREAST LUMPECTOMY Left 09/05/2019   Procedure: RE-EXCISION OF LEFT BREAST TUMOR;  Surgeon: Abigail Miyamoto, MD;  Location: [facility] OR;  Service: General;  Laterality: Left;  LMA   SKIN BIOPSY     ?basal/squam cell on chest   TONSILLECTOMY AND ADENOIDECTOMY     TOTAL KNEE ARTHROPLASTY Right 03/04/2019    Procedure: RIGHT TOTAL KNEE ARTHROPLASTY;  Surgeon: Eldred Manges, MD;  Location: [facility] OR;  Service: Orthopedics;  Laterality: Right;   UPPER GI ENDOSCOPY      FAMHx:  Family History  Problem Relation Age of Onset   Diabetes Father    Hypertension Mother    Depression Mother    Macular degeneration Mother    Heart disease Mother     SOCHx:   reports that she quit smoking about 43 years ago. Her smoking use included cigarettes. She started smoking about 58 years ago. She has a 7.5 pack-year smoking history. She has never used smokeless tobacco. She reports current alcohol use. She reports that she does not use drugs.  ALLERGIES:  Allergies  Allergen Reactions   Zetia [Ezetimibe] Diarrhea    Patient states she had IBS, that took a year to resolve.   Prednisone Nausea And Vomiting   Statins Other (See Comments)    paralyzing   Strattera [Atomoxetine Hcl]     Increased sadness    ROS: Pertinent items noted in HPI and remainder of comprehensive ROS otherwise negative.  HOME MEDS: Current Outpatient Medications on File Prior to Visit  Medication Sig Dispense Refill   amphetamine-dextroamphetamine (ADDERALL) 30 MG tablet Take 1/2 to 1 tablet 2 x /day as needed for Focus & Concentration TEVA BRAND ONLY 60 tablet 0   celecoxib (CELEBREX) 200 MG capsule Take 200 mg by mouth 2 (two) times daily.     Cholecalciferol (DIALYVITE VITAMIN D 5000) 125 MCG (5000 UT) capsule Take 10,000 Units by mouth daily.     Evolocumab (REPATHA SURECLICK) 140 MG/ML SOAJ Inject 140 mg into the skin every 14 (fourteen) days. 6 mL 3   famciclovir (FAMVIR) 500 MG tablet Take 1 tablet (500 mg total) by mouth 2 (two) times daily as needed (fever blisters). 60 tablet 1   Semaglutide-Weight Management (WEGOVY) 0.25 MG/0.5ML SOAJ Inject 0.25 mg into the skin once a week.     No current facility-administered medications on file prior to visit.    LABS/IMAGING: No results found for this or any previous visit (from  the past 48 hours). No results found.  LIPID PANEL:    Component Value Date/Time   CHOL 290 (H) 01/19/2023 1055   TRIG 221 (H) 01/19/2023 1055   HDL 82 01/19/2023 1055   CHOLHDL 3.5 01/19/2023 1055   VLDL 46 (H) 10/04/2016 1005   LDLCALC 171 (H) 01/19/2023 1055    WEIGHTS: Wt Readings from Last 3 Encounters:  06/01/23 191 lb 9.6 oz (86.9 kg)  03/02/23 196 lb 6.4 oz (89.1 kg)  01/19/23 198 lb 9.6 oz (90.1 kg)    VITALS: BP 122/80 (BP Location: Right Arm, Patient Position: Sitting, Cuff Size: Normal)   Pulse (!) 46  Ht 5' 2.5" (1.588 m)   Wt 191 lb 9.6 oz (86.9 kg)   LMP  (LMP Unknown)   SpO2 96%   BMI 34.49 kg/m   EXAM: Deferred  EKG: Deferred  ASSESSMENT: Mixed dyslipidemia with high triglycerides, possible familial hyperlipidemia CAC score of 16.7, 43rd percentile for age and sex matched controls (06/2021) Aortic atherosclerosis Statin and ezetimibe intolerant Stable lung nodule, low-level FDG uptake, followed by Dr. Delton Coombes  PLAN: 1.   Mrs. Piekarski has had a good response to Repatha and seems to be tolerating it well.  Her lipids have improved and are still somewhat elevated although her small LDL particle numbers are low and her LDL is a little above target.  Triglycerides are also elevated.  I have encouraged more lifestyle modification including more exercise and activity.  Plan follow-up in 1 year with Marcelino Duster and a repeat lipid NMR.  Chrystie Nose, MD, [facility], FACP  [place]  [facility]  Medical Director of the Advanced Lipid Disorders &  Cardiovascular Risk Reduction Clinic Diplomate of the American Board of Clinical Lipidology Attending Cardiologist  Direct Dial: [phone]  Fax: [phone]  Website:  [email or web address] Nicely Jayant Kriz 06/01/2023, 9:23 AM

## 2023-06-01 NOTE — Patient Instructions (Signed)
 Medication Instructions:  Your physician recommends that you continue on your current medications as directed. Please refer to the Current Medication list given to you today.  *If you need a refill on your cardiac medications before your next appointment, please call your pharmacy*  Lab Work: Your physician recommends that you return for lab work in: 1 week prior to office visit in 1 year.  If you have labs (blood work) drawn today and your tests are completely normal, you will receive your results only by: MyChart Message (if you have MyChart) OR A paper copy in the mail If you have any lab test that is abnormal or we need to change your treatment, we will call you to review the results.   Follow-Up: At [facility], you and your health needs are our priority.  As part of our continuing mission to provide you with exceptional heart care, our providers are all part of one team.  This team includes your primary Cardiologist (physician) and Advanced Practice Providers or APPs (Physician Assistants and Nurse Practitioners) who all work together to provide you with the care you need, when you need it.  Your next appointment:   12 month(s)   Provider:   K. Italy Hilty, MD or Eligha Bridegroom, NP     We recommend signing up for the patient portal called "MyChart".  Sign up information is provided on this After Visit Summary.  MyChart is used to connect with patients for Virtual Visits (Telemedicine).  Patients are able to view lab/test results, encounter notes, upcoming appointments, etc.  Non-urgent messages can be sent to your provider as well.   To learn more about what you can do with MyChart, go to [email or web address].   Other Instructions [facility]      1st Floor: - Lobby - Registration  - Pharmacy  - Lab - Cafe  2nd Floor: - PV Lab - Diagnostic Testing (echo, CT, nuclear med)  3rd Floor: - Vacant  4th Floor: - TCTS (cardiothoracic surgery) - AFib  Clinic - Structural Heart Clinic - Vascular Surgery  - Vascular Ultrasound  5th Floor: - HeartCare Cardiology (general and EP) - Clinical Pharmacy for coumadin, hypertension, lipid, weight-loss medications, and med management appointments    Valet parking services will be available as well.

## 2023-06-30 ENCOUNTER — Encounter

## 2023-07-06 ENCOUNTER — Other Ambulatory Visit: Payer: Self-pay | Admitting: Obstetrics and Gynecology

## 2023-07-06 DIAGNOSIS — Z853 Personal history of malignant neoplasm of breast: Secondary | ICD-10-CM

## 2023-07-06 DIAGNOSIS — Z9889 Other specified postprocedural states: Secondary | ICD-10-CM

## 2023-07-10 ENCOUNTER — Ambulatory Visit
Admission: RE | Admit: 2023-07-10 | Discharge: 2023-07-10 | Disposition: A | Discharge status: Home / Self Care | Source: Ambulatory Visit | Attending: Internal Medicine | Admitting: Internal Medicine

## 2023-07-10 DIAGNOSIS — Z9889 Other specified postprocedural states: Secondary | ICD-10-CM

## 2023-08-11 ENCOUNTER — Encounter: Payer: Medicare Other | Admitting: Internal Medicine

## 2023-08-21 ENCOUNTER — Ambulatory Visit: Payer: Medicare Other | Admitting: Internal Medicine

## 2023-09-29 ENCOUNTER — Ambulatory Visit: Payer: Medicare Other | Admitting: Nurse Practitioner

## 2023-10-12 ENCOUNTER — Ambulatory Visit (HOSPITAL_BASED_OUTPATIENT_CLINIC_OR_DEPARTMENT_OTHER): Admitting: Family Medicine

## 2023-10-12 ENCOUNTER — Encounter (HOSPITAL_BASED_OUTPATIENT_CLINIC_OR_DEPARTMENT_OTHER): Payer: Self-pay | Admitting: Family Medicine

## 2023-10-12 VITALS — BP 148/94 | HR 78 | Temp 98.6°F | Ht 62.5 in | Wt 189.4 lb

## 2023-10-12 DIAGNOSIS — F900 Attention-deficit hyperactivity disorder, predominantly inattentive type: Secondary | ICD-10-CM

## 2023-10-12 DIAGNOSIS — R0989 Other specified symptoms and signs involving the circulatory and respiratory systems: Secondary | ICD-10-CM

## 2023-10-12 DIAGNOSIS — E559 Vitamin D deficiency, unspecified: Secondary | ICD-10-CM | POA: Diagnosis not present

## 2023-10-12 DIAGNOSIS — E785 Hyperlipidemia, unspecified: Secondary | ICD-10-CM | POA: Diagnosis not present

## 2023-10-12 DIAGNOSIS — R7303 Prediabetes: Secondary | ICD-10-CM | POA: Diagnosis not present

## 2023-10-12 MED ORDER — AMPHETAMINE-DEXTROAMPHETAMINE 30 MG PO TABS
ORAL_TABLET | ORAL | 0 refills | Status: DC
Start: 2023-10-12 — End: 2024-01-12

## 2023-10-12 NOTE — Progress Notes (Unsigned)
 New Patient Office Visit  Subjective   Patient ID: Kathy Cook, female    DOB: [birth year]  Age: 78 y.o. MRN: [id number]  CC:  Chief Complaint  Patient presents with   Establish Care    Patient presents today to establish care. She states that she has been having issues with fatigue. She was previously seen by Dr Tonita    HPI Kathy Cook presents to establish care Last PCP - Dr. Tonita  Hyperlipidemia: Has been intolerant of statins in the past, currently follows with cardiology.  Because of statin intolerance, she currently takes Repatha .  She does follow with [facility] related to bilateral macular degeneration.  Prediabetes: Hemoglobin A1c has fluctuated between normal range and low end of prediabetes range.  No current concerns with polyuria or polydipsia.  Hypertension: Generally has been well-controlled without current use of medication.  No current symptoms of chest pain or headaches.  History of fatigue, started more so with being on treatment for breast cancer. Has persisted some following discontinuation of medication, but improving more recently. Notes some issues in the past few days, but states that her husband has decided to stop wearing his CPAP and is now snoring through the night.  Reports arthritis in ankles and feet - takes celecoxib as needed for this.  Reports ADHD, takes Adderall for control of symptoms. Requesting refill today.  Patient is originally from [place], [place]. 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 She enjoys playing bridge, Conservation officer, nature, caring for her grandchildren.  Outpatient Encounter Medications as of 10/12/2023  Medication Sig   celecoxib (CELEBREX) 200 MG capsule Take 200 mg by mouth 2 (two) times daily.   Cholecalciferol (DIALYVITE VITAMIN D  5000) 125 MCG (5000 UT) capsule Take 10,000 Units by mouth daily.   Evolocumab  (REPATHA  SURECLICK) 140 MG/ML SOAJ Inject 140 mg into the skin  every 14 (fourteen) days.   famciclovir  (FAMVIR ) 500 MG tablet Take 1 tablet (500 mg total) by mouth 2 (two) times daily as needed (fever blisters).   NONFORMULARY OR COMPOUNDED ITEM    [DISCONTINUED] amphetamine -dextroamphetamine  (ADDERALL) 30 MG tablet Take 1/2 to 1 tablet 2 x /day as needed for Focus & Concentration TEVA BRAND ONLY   amphetamine -dextroamphetamine  (ADDERALL) 30 MG tablet Take 1 tablet in the morning and 0.5 tablet  at noon. TEVA BRAND ONLY   Semaglutide-Weight Management (WEGOVY) 0.25 MG/0.5ML SOAJ Inject 0.25 mg into the skin once a week. (Patient not taking: Reported on 10/12/2023)   No facility-administered encounter medications on file as of 10/12/2023.    Past Medical History:  Diagnosis Date   Allergy    Anxiety    Attention deficit disorder (ADD)    Cancer (HCC)    ? basal/squam cell on chest, and forehead   Depression    Dry eyes    GERD (gastroesophageal reflux disease)    diet controlled, no meds   HLD (hyperlipidemia)    HSV infection    Lumbar degenerative disc disease    Ocular rosacea    Osteopenia    Personal history of radiation therapy     Past Surgical History:  Procedure Laterality Date   BREAST BIOPSY Left 05/28/2019   BREAST LUMPECTOMY Left 07/18/2019   Procedure: LEFT BREAST LUMPECTOMY;  Surgeon: Vernetta Berg, MD;  Location:  SURGERY CENTER;  Service: General;  Laterality: Left;  LMA   COLONOSCOPY  2019   RE-EXCISION OF BREAST LUMPECTOMY Left 09/05/2019   Procedure: RE-EXCISION OF LEFT  BREAST TUMOR;  Surgeon: Vernetta Berg, MD;  Location: [facility] OR;  Service: General;  Laterality: Left;  LMA   SKIN BIOPSY     ?basal/squam cell on chest   TONSILLECTOMY AND ADENOIDECTOMY     TOTAL KNEE ARTHROPLASTY Right 03/04/2019   Procedure: RIGHT TOTAL KNEE ARTHROPLASTY;  Surgeon: Barbarann Oneil BROCKS, MD;  Location: [facility] OR;  Service: Orthopedics;  Laterality: Right;   UPPER GI ENDOSCOPY      Family History  Problem Relation Age of Onset    Diabetes Father    Hypertension Mother    Depression Mother    Macular degeneration Mother    Heart disease Mother     Social History   Socioeconomic History   Marital status: Married    Spouse name: Not on file   Number of children: Not on file   Years of education: Not on file   Highest education level: Not on file  Occupational History   Not on file  Tobacco Use   Smoking status: Former    Current packs/day: 0.00    Average packs/day: 0.5 packs/day for 15.0 years (7.5 ttl pk-yrs)    Types: Cigarettes    Start date: 03/23/1965    Quit date: 03/23/1980    Years since quitting: 43.5   Smokeless tobacco: Never  Vaping Use   Vaping status: Never Used  Substance and Sexual Activity   Alcohol use: Yes    Alcohol/week: 4.0 standard drinks of alcohol    Types: 2 Glasses of wine, 2 Standard drinks or equivalent per week    Comment: social   Drug use: No   Sexual activity: Yes    Birth control/protection: Post-menopausal  Other Topics Concern   Not on file  Social History Narrative   Not on file   Social Drivers of Health   Financial Resource Strain: Not on file  Food Insecurity: Not on file  Transportation Needs: Not on file  Physical Activity: Not on file  Stress: Not on file  Social Connections: Not on file  Intimate Partner Violence: Not on file    Objective   BP (!) 148/94 (BP Location: Left Arm, Patient Position: Sitting)   Pulse 78   Temp 98.6 F (37 C) (Oral)   Ht 5' 2.5 (1.588 m)   Wt 189 lb 6.4 oz (85.9 kg)   LMP  (LMP Unknown)   BMI 34.09 kg/m   Physical Exam  78 year old female in no acute distress Cardiovascular exam with regular rate and rhythm Lungs clear to auscultation bilaterally  Assessment & Plan:   Hyperlipidemia, unspecified hyperlipidemia type Assessment & Plan: She reports that she has been intolerant to statin as well as ezetimibe .  She has had notable elevation in the past of total cholesterol, LDL.  We did discuss general  considerations related to management of cholesterol/lowering risk of adverse cardiovascular event.  She has had evaluation with Dr. Mona with recommendation for utilizing Repatha .   Attention deficit hyperactivity disorder (ADHD), predominantly inattentive type Assessment & Plan: Can continue with current medication as prescribed.  Reports that she has been on this medication for least couple decades.  While she was not diagnosed as a child, reports that she has had symptoms consistent with underlying ADHD since she was young. In conjunction with concern for underlying fatigue, it is somewhat unusual that patient Meredith that without medication, she will find that she can very easily fall asleep.  We can continue to monitor progress moving forward and monitor symptoms with medication  use.  Orders: -     Amphetamine -Dextroamphetamine ; Take 1 tablet in the morning and 0.5 tablet  at noon. TEVA BRAND ONLY  Dispense: 45 tablet; Refill: 0  Vitamin D  deficiency Assessment & Plan: We can proceed with labs at this time for monitoring.  Recommend continuing with supplementation as needed to maintain vitamin D  within normal range.  Orders: -     VITAMIN D  25 Hydroxy (Vit-D Deficiency, Fractures)  Prediabetes Assessment & Plan: Recommend continuing with lifestyle modifications.  We can keep a close eye on hemoglobin A1c monitoring.  Orders: -     Hemoglobin A1c  Labile hypertension Assessment & Plan: Blood pressure is elevated in office today.  She reports that it will tend to be higher at times when in the office. Not currently utilizing any medications.  We can continue to hold off on medications for now.  Recommend intermittent monitoring of blood pressure at home, DASH diet.  We will keep a close eye on the blood pressure in the office as well.  Orders: -     CBC with Differential/Platelet -     Comprehensive metabolic panel with GFR -     TSH Rfx on Abnormal to Free T4  Return in about 3  months (around 01/12/2024).   Spent 49 minutes on this patient encounter, including preparation, chart review, face-to-face counseling with patient and coordination of care, and documentation of encounter   ___________________________________________ Toua Stites de [place], MD, ABFM, [facility]

## 2023-10-13 LAB — CBC WITH DIFFERENTIAL/PLATELET
Basophils Absolute: 0 x10E3/uL (ref 0.0–0.2)
Basos: 1 %
EOS (ABSOLUTE): 0.1 x10E3/uL (ref 0.0–0.4)
Eos: 2 %
Hematocrit: 43 % (ref 34.0–46.6)
Hemoglobin: 13.7 g/dL (ref 11.1–15.9)
Immature Grans (Abs): 0 x10E3/uL (ref 0.0–0.1)
Immature Granulocytes: 0 %
Lymphocytes Absolute: 1.2 x10E3/uL (ref 0.7–3.1)
Lymphs: 22 %
MCH: 30.9 pg (ref 26.6–33.0)
MCHC: 31.9 g/dL (ref 31.5–35.7)
MCV: 97 fL (ref 79–97)
Monocytes Absolute: 0.7 x10E3/uL (ref 0.1–0.9)
Monocytes: 12 %
Neutrophils Absolute: 3.5 x10E3/uL (ref 1.4–7.0)
Neutrophils: 62 %
Platelets: 211 x10E3/uL (ref 150–450)
RBC: 4.43 x10E6/uL (ref 3.77–5.28)
RDW: 12.4 % (ref 11.7–15.4)
WBC: 5.6 x10E3/uL (ref 3.4–10.8)

## 2023-10-13 LAB — COMPREHENSIVE METABOLIC PANEL WITH GFR
ALT: 19 IU/L (ref 0–32)
AST: 17 IU/L (ref 0–40)
Albumin: 4.3 g/dL (ref 3.8–4.8)
Alkaline Phosphatase: 91 IU/L (ref 44–121)
BUN/Creatinine Ratio: 25 (ref 12–28)
BUN: 22 mg/dL (ref 8–27)
Bilirubin Total: 0.3 mg/dL (ref 0.0–1.2)
CO2: 22 mmol/L (ref 20–29)
Calcium: 9.6 mg/dL (ref 8.7–10.3)
Chloride: 102 mmol/L (ref 96–106)
Creatinine, Ser: 0.88 mg/dL (ref 0.57–1.00)
Globulin, Total: 2.2 g/dL (ref 1.5–4.5)
Glucose: 77 mg/dL (ref 70–99)
Potassium: 4.3 mmol/L (ref 3.5–5.2)
Sodium: 139 mmol/L (ref 134–144)
Total Protein: 6.5 g/dL (ref 6.0–8.5)
eGFR: 68 mL/min/1.73 (ref >59)

## 2023-10-13 LAB — HEMOGLOBIN A1C
Est. average glucose Bld gHb Est-mCnc: 108 mg/dL
Hgb A1c MFr Bld: 5.4 % (ref 4.8–5.6)

## 2023-10-13 LAB — VITAMIN D 25 HYDROXY (VIT D DEFICIENCY, FRACTURES): Vit D, 25-Hydroxy: 93.4 ng/mL (ref 30.0–100.0)

## 2023-10-13 LAB — TSH RFX ON ABNORMAL TO FREE T4: TSH: 2.27 u[IU]/mL (ref 0.450–4.500)

## 2023-10-16 NOTE — Assessment & Plan Note (Signed)
 Blood pressure is elevated in office today.  She reports that it will tend to be higher at times when in the office. Not currently utilizing any medications.  We can continue to hold off on medications for now.  Recommend intermittent monitoring of blood pressure at home, DASH diet.  We will keep a close eye on the blood pressure in the office as well.

## 2023-10-16 NOTE — Assessment & Plan Note (Signed)
 Can continue with current medication as prescribed.  Reports that she has been on this medication for least couple decades.  While she was not diagnosed as a child, reports that she has had symptoms consistent with underlying ADHD since she was young. In conjunction with concern for underlying fatigue, it is somewhat unusual that patient Kathy Cook that without medication, she will find that she can very easily fall asleep.  We can continue to monitor progress moving forward and monitor symptoms with medication use.

## 2023-10-16 NOTE — Assessment & Plan Note (Addendum)
 She reports that she has been intolerant to statin as well as ezetimibe .  She has had notable elevation in the past of total cholesterol, LDL.  We did discuss general considerations related to management of cholesterol/lowering risk of adverse cardiovascular event.  She has had evaluation with Dr. Mona with recommendation for utilizing Repatha .

## 2023-10-16 NOTE — Assessment & Plan Note (Signed)
 We can proceed with labs at this time for monitoring.  Recommend continuing with supplementation as needed to maintain vitamin D  within normal range.

## 2023-10-16 NOTE — Assessment & Plan Note (Signed)
 Recommend continuing with lifestyle modifications.  We can keep a close eye on hemoglobin A1c monitoring.

## 2023-10-18 ENCOUNTER — Ambulatory Visit (HOSPITAL_BASED_OUTPATIENT_CLINIC_OR_DEPARTMENT_OTHER): Payer: Self-pay | Admitting: Family Medicine

## 2023-10-23 ENCOUNTER — Ambulatory Visit: Admitting: Family Medicine

## 2023-11-15 ENCOUNTER — Telehealth: Payer: Self-pay | Admitting: Pharmacy Technician

## 2023-11-15 NOTE — Telephone Encounter (Signed)
 Pharmacy Patient Advocate Encounter   Received notification from CoverMyMeds that prior authorization for repatha  is required/requested.   Insurance verification completed.   The patient is insured through [facility] .   Per test claim: PA required; PA submitted to above mentioned insurance via Latent Key/confirmation #/EOC [id number] Status is pending

## 2023-11-15 NOTE — Telephone Encounter (Signed)
 Pharmacy Patient Advocate Encounter  Received notification from [employer] that Prior Authorization for repatha  has been APPROVED from 11/15/23 to 02/28/24   PA #/Case ID/Reference #: [id number]

## 2023-11-16 ENCOUNTER — Ambulatory Visit: Payer: Medicare Other | Admitting: Nurse Practitioner

## 2023-11-23 ENCOUNTER — Ambulatory Visit: Payer: Self-pay

## 2023-11-23 ENCOUNTER — Telehealth (HOSPITAL_BASED_OUTPATIENT_CLINIC_OR_DEPARTMENT_OTHER): Payer: Self-pay | Admitting: Family Medicine

## 2023-11-23 NOTE — Telephone Encounter (Addendum)
 Third attempt; no answer Second call ; no answer; left vm  First attempt; no answer  Patient believes to have shingles, has no blisters but feels her skin to be rough. Whenever she has this come along she usually takes famciclovir  (FAMVIR ) 500 MG tablet but will be running out by the weekend and is scared the refill wont make it in time and she will be in pain all weekend. Would like to know what else she can do to avoid the pain

## 2023-11-23 NOTE — Telephone Encounter (Unsigned)
 Copied from CRM [id number]. Topic: Clinical - Medication Refill >> Nov 23, 2023 10:58 AM Debby BROCKS wrote: Medication: famciclovir  (FAMVIR ) 500 MG tablet  Has the patient contacted their pharmacy? Yes (Agent: If no, request that the patient contact the pharmacy for the refill. If patient does not wish to contact the pharmacy document the reason why and proceed with request.) (Agent: If yes, when and what did the pharmacy advise?)  This is the patient's preferred pharmacy:  [id number] [place], [place] - [address] AT [facility] OF [facility] [address] & [facility] [address] [place] [address] Phone: [phone] Fax: [phone]  Is this the correct pharmacy for this prescription? Yes If no, delete pharmacy and type the correct one.   Has the prescription been filled recently? Yes  Is the patient out of the medication? No, has 2 pills lefts  Has the patient been seen for an appointment in the last year OR does the patient have an upcoming appointment? Yes  Can we respond through MyChart? Yes  Agent: Please be advised that Rx refills may take up to 3 business days. We ask that you follow-up with your pharmacy.

## 2023-11-23 NOTE — Telephone Encounter (Signed)
 Please see other phone encounter

## 2023-11-23 NOTE — Telephone Encounter (Signed)
 Patient made an appt for 9/26

## 2023-11-23 NOTE — Telephone Encounter (Signed)
 Patient called in through e2c2 and stated she has shingles and needs Famciclovir  500 mg sent to pharmacy.  Patient states she takes this medication when she has flare ups for fever blisters.  Patient wanted an appointment but we are full for today and tomorrow.  Please advise.

## 2023-11-24 ENCOUNTER — Ambulatory Visit (HOSPITAL_BASED_OUTPATIENT_CLINIC_OR_DEPARTMENT_OTHER): Admitting: Family Medicine

## 2023-11-24 ENCOUNTER — Encounter (HOSPITAL_BASED_OUTPATIENT_CLINIC_OR_DEPARTMENT_OTHER): Payer: Self-pay | Admitting: Family Medicine

## 2023-11-24 VITALS — BP 170/108 | HR 76 | Ht 62.0 in | Wt 187.0 lb

## 2023-11-24 DIAGNOSIS — M546 Pain in thoracic spine: Secondary | ICD-10-CM | POA: Diagnosis not present

## 2023-11-24 MED ORDER — FAMCICLOVIR 500 MG PO TABS: 500.0000 mg | ORAL_TABLET | Freq: Three times a day (TID) | ORAL | 1 refills | Status: AC

## 2023-11-24 NOTE — Patient Instructions (Signed)

## 2023-11-24 NOTE — Progress Notes (Signed)
    Procedures performed today:    None.  Independent interpretation of notes and tests performed by another provider:   None.  Brief History, Exam, Impression, and Recommendations:    BP (!) 170/108 (BP Location: Left Arm, Patient Position: Sitting, Cuff Size: Normal)   Pulse 76   Ht 5' 2 (1.575 m)   Wt 187 lb (84.8 kg)   LMP  (LMP Unknown)   SpO2 96%   BMI 34.20 kg/m   Right-sided thoracic back pain, unspecified chronicity Assessment & Plan: First noted that her back was hurting several days ago. Was feeling the pain when off on a trip with some girls, then had to drive home. Thinks it just started aching and feeling sting-y. Reports having shingles and that this is what it feels like. She started taking famciclovir . She would typically have famciclovir  on hand for oral blisters. On exam, patient is in no acute distress, vital signs stable.  Over right lower thoracic back, patient has pain with light touch.  No overlying rashes observed, however there is 1 singular subcentimeter lesion noted with mild scaling over top.  No significant surrounding erythema.  No current oozing, drainage or discharge.  No other lesions or rash noted over back.  No rash over anterior trunk. Exam not strongly suggestive of shingles, however patient reports similar episodes in the past which have been treated with antiviral medication and have responded to this treatment previously.  We discussed considerations, can proceed with antiviral therapy at this time and monitor symptom progress.  Additional consideration is musculoskeletal etiology.  We can monitor progress with treatment and observe for resolution with use of antiviral.  She will reach back out if having incomplete resolution   Other orders -     Famciclovir ; Take 1 tablet (500 mg total) by mouth 3 (three) times daily.  Dispense: 60 tablet; Refill: 1  Return if symptoms worsen or fail to  improve.   ___________________________________________ Alfredo Spong de [place], MD, ABFM, [facility] Primary Care and Sports Medicine [facility]

## 2023-11-24 NOTE — Assessment & Plan Note (Signed)
 First noted that her back was hurting several days ago. Was feeling the pain when off on a trip with some girls, then had to drive home. Thinks it just started aching and feeling sting-y. Reports having shingles and that this is what it feels like. She started taking famciclovir . She would typically have famciclovir  on hand for oral blisters. On exam, patient is in no acute distress, vital signs stable.  Over right lower thoracic back, patient has pain with light touch.  No overlying rashes observed, however there is 1 singular subcentimeter lesion noted with mild scaling over top.  No significant surrounding erythema.  No current oozing, drainage or discharge.  No other lesions or rash noted over back.  No rash over anterior trunk. Exam not strongly suggestive of shingles, however patient reports similar episodes in the past which have been treated with antiviral medication and have responded to this treatment previously.  We discussed considerations, can proceed with antiviral therapy at this time and monitor symptom progress.  Additional consideration is musculoskeletal etiology.  We can monitor progress with treatment and observe for resolution with use of antiviral.  She will reach back out if having incomplete resolution

## 2023-11-28 ENCOUNTER — Ambulatory Visit (INDEPENDENT_AMBULATORY_CARE_PROVIDER_SITE_OTHER): Admitting: *Deleted

## 2023-11-28 ENCOUNTER — Encounter (HOSPITAL_BASED_OUTPATIENT_CLINIC_OR_DEPARTMENT_OTHER): Payer: Self-pay

## 2023-11-28 VITALS — Ht 62.0 in | Wt 187.0 lb

## 2023-11-28 DIAGNOSIS — Z0000 Encounter for general adult medical examination without abnormal findings: Secondary | ICD-10-CM

## 2023-11-28 NOTE — Patient Instructions (Signed)
 Kathy Cook , Thank you for taking time to come for your Medicare Wellness Visit. I appreciate your ongoing commitment to your health goals. Please review the following plan we discussed and let me know if I can assist you in the future.   Screening recommendations/referrals: Colonoscopy: up to date Mammogram: up to date Bone Density: up to date Recommended yearly ophthalmology/optometry visit for glaucoma screening and checkup Recommended yearly dental visit for hygiene and checkup  Vaccinations: Influenza vaccine: Education provided Pneumococcal vaccine: Education provided Tdap vaccine: Education provided Shingles vaccine: Education provided   Preventive Care 65 Years and Older, Female Preventive care refers to lifestyle choices and visits with your health care provider that can promote health and wellness. What does preventive care include? A yearly physical exam. This is also called an annual well check. Dental exams once or twice a year. Routine eye exams. Ask your health care provider how often you should have your eyes checked. Personal lifestyle choices, including: Daily care of your teeth and gums. Regular physical activity. Eating a healthy diet. Avoiding tobacco and drug use. Limiting alcohol use. Practicing safe sex. Taking low-dose aspirin  every day. Taking vitamin and mineral supplements as recommended by your health care provider. What happens during an annual well check? The services and screenings done by your health care provider during your annual well check will depend on your age, overall health, lifestyle risk factors, and family history of disease. Counseling  Your health care provider may ask you questions about your: Alcohol use. Tobacco use. Drug use. Emotional well-being. Home and relationship well-being. Sexual activity. Eating habits. History of falls. Memory and ability to understand (cognition). Work and work Astronomer. Reproductive  health. Screening  You may have the following tests or measurements: Height, weight, and BMI. Blood pressure. Lipid and cholesterol levels. These may be checked every 5 years, or more frequently if you are over 76 years old. Skin check. Lung cancer screening. You may have this screening every year starting at age 76 if you have a 30-pack-year history of smoking and currently smoke or have quit within the past 15 years. Fecal occult blood test (FOBT) of the stool. You may have this test every year starting at age 76. Flexible sigmoidoscopy or colonoscopy. You may have a sigmoidoscopy every 5 years or a colonoscopy every 10 years starting at age 76. Hepatitis C blood test. Hepatitis B blood test. Sexually transmitted disease (STD) testing. Diabetes screening. This is done by checking your blood sugar (glucose) after you have not eaten for a while (fasting). You may have this done every 1-3 years. Bone density scan. This is done to screen for osteoporosis. You may have this done starting at age 76. Mammogram. This may be done every 1-2 years. Talk to your health care provider about how often you should have regular mammograms. Talk with your health care provider about your test results, treatment options, and if necessary, the need for more tests. Vaccines  Your health care provider may recommend certain vaccines, such as: Influenza vaccine. This is recommended every year. Tetanus, diphtheria, and acellular pertussis (Tdap, Td) vaccine. You may need a Td booster every 10 years. Zoster vaccine. You may need this after age 76. Pneumococcal 13-valent conjugate (PCV13) vaccine. One dose is recommended after age 76. Pneumococcal polysaccharide (PPSV23) vaccine. One dose is recommended after age 65. Talk to your health care provider about which screenings and vaccines you need and how often you need them. This information is not intended to replace advice  given to you by your health care provider.  Make sure you discuss any questions you have with your health care provider. Document Released: 03/13/2015 Document Revised: 11/04/2015 Document Reviewed: 12/16/2014 Elsevier Interactive Patient Education  2017 [employer].  Fall Prevention in the Home Falls can cause injuries. They can happen to people of all ages. There are many things you can do to make your home safe and to help prevent falls. What can I do on the outside of my home? Regularly fix the edges of walkways and driveways and fix any cracks. Remove anything that might make you trip as you walk through a door, such as a raised step or threshold. Trim any bushes or trees on the path to your home. Use bright outdoor lighting. Clear any walking paths of anything that might make someone trip, such as rocks or tools. Regularly check to see if handrails are loose or broken. Make sure that both sides of any steps have handrails. Any raised decks and porches should have guardrails on the edges. Have any leaves, snow, or ice cleared regularly. Use sand or salt on walking paths during winter. Clean up any spills in your garage right away. This includes oil or grease spills. What can I do in the bathroom? Use night lights. Install grab bars by the toilet and in the tub and shower. Do not use towel bars as grab bars. Use non-skid mats or decals in the tub or shower. If you need to sit down in the shower, use a plastic, non-slip stool. Keep the floor dry. Clean up any water that spills on the floor as soon as it happens. Remove soap buildup in the tub or shower regularly. Attach bath mats securely with double-sided non-slip rug tape. Do not have throw rugs and other things on the floor that can make you trip. What can I do in the bedroom? Use night lights. Make sure that you have a light by your bed that is easy to reach. Do not use any sheets or blankets that are too big for your bed. They should not hang down onto the floor. Have a  firm chair that has side arms. You can use this for support while you get dressed. Do not have throw rugs and other things on the floor that can make you trip. What can I do in the kitchen? Clean up any spills right away. Avoid walking on wet floors. Keep items that you use a lot in easy-to-reach places. If you need to reach something above you, use a strong step stool that has a grab bar. Keep electrical cords out of the way. Do not use floor polish or wax that makes floors slippery. If you must use wax, use non-skid floor wax. Do not have throw rugs and other things on the floor that can make you trip. What can I do with my stairs? Do not leave any items on the stairs. Make sure that there are handrails on both sides of the stairs and use them. Fix handrails that are broken or loose. Make sure that handrails are as long as the stairways. Check any carpeting to make sure that it is firmly attached to the stairs. Fix any carpet that is loose or worn. Avoid having throw rugs at the top or bottom of the stairs. If you do have throw rugs, attach them to the floor with carpet tape. Make sure that you have a light switch at the top of the stairs and the bottom of  the stairs. If you do not have them, ask someone to add them for you. What else can I do to help prevent falls? Wear shoes that: Do not have high heels. Have rubber bottoms. Are comfortable and fit you well. Are closed at the toe. Do not wear sandals. If you use a stepladder: Make sure that it is fully opened. Do not climb a closed stepladder. Make sure that both sides of the stepladder are locked into place. Ask someone to hold it for you, if possible. Clearly mark and make sure that you can see: Any grab bars or handrails. First and last steps. Where the edge of each step is. Use tools that help you move around (mobility aids) if they are needed. These include: Canes. Walkers. Scooters. Crutches. Turn on the lights when you  go into a dark area. Replace any light bulbs as soon as they burn out. Set up your furniture so you have a clear path. Avoid moving your furniture around. If any of your floors are uneven, fix them. If there are any pets around you, be aware of where they are. Review your medicines with your doctor. Some medicines can make you feel dizzy. This can increase your chance of falling. Ask your doctor what other things that you can do to help prevent falls. This information is not intended to replace advice given to you by your health care provider. Make sure you discuss any questions you have with your health care provider. Document Released: 12/11/2008 Document Revised: 07/23/2015 Document Reviewed: 03/21/2014 Elsevier Interactive Patient Education  2017 [employer].

## 2023-11-28 NOTE — Progress Notes (Signed)
 Subjective:   Kathy Cook is a 78 y.o. female who presents for Medicare Annual (Subsequent) preventive examination.  Visit Complete: Virtual I connected with  Kathy Cook on 11/28/23 by a audio enabled telemedicine application and verified that I am speaking with the correct person using two identifiers.  Patient Location: Home  Provider Location: Home Office  I discussed the limitations of evaluation and management by telemedicine. The patient expressed understanding and agreed to proceed.  Vital Signs: Because this visit was a virtual/telehealth visit, some criteria may be missing or patient reported. Any vitals not documented were not able to be obtained and vitals that have been documented are patient reported.   Cardiac Risk Factors include: advanced age (>69men, >56 women);family history of premature cardiovascular disease;obesity (BMI >30kg/m2)     Objective:    Today's Vitals   11/28/23 0815  Weight: 187 lb (84.8 kg)  Height: 5' 2 (1.575 m)  PainSc: 3    Body mass index is 34.2 kg/m.     11/28/2023    8:18 AM 09/28/2022   12:32 PM 11/23/2021    9:35 PM 11/19/2020    2:48 PM 11/19/2020    2:46 PM 11/18/2019    9:21 AM 10/01/2019   12:46 PM  Advanced Directives  Does Patient Have a Medical Advance Directive? Yes Yes Yes  Yes Yes No  Type of Advance Directive Healthcare Power of Attorney Living will  Living will;Healthcare Power of [employer] Power of [place];Living will   Does patient want to make changes to medical advance directive?    No - Patient declined     Copy of Healthcare Power of Attorney in Chart? No - copy requested   No - copy requested  No - copy requested   Would patient like information on creating a medical advance directive?       No - Patient declined    Current Medications (verified) Outpatient Encounter Medications as of 11/28/2023  Medication Sig   amphetamine -dextroamphetamine  (ADDERALL) 30 MG tablet Take 1 tablet in the  morning and 0.5 tablet  at noon. TEVA BRAND ONLY   celecoxib (CELEBREX) 200 MG capsule Take 200 mg by mouth 2 (two) times daily.   Cholecalciferol (DIALYVITE VITAMIN D  5000) 125 MCG (5000 UT) capsule Take 10,000 Units by mouth daily.   Evolocumab  (REPATHA  SURECLICK) 140 MG/ML SOAJ Inject 140 mg into the skin every 14 (fourteen) days.   famciclovir  (FAMVIR ) 500 MG tablet Take 1 tablet (500 mg total) by mouth 3 (three) times daily.   NONFORMULARY OR COMPOUNDED ITEM    Semaglutide-Weight Management (WEGOVY) 0.25 MG/0.5ML SOAJ Inject 0.25 mg into the skin once a week. (Patient not taking: Reported on 11/28/2023)   No facility-administered encounter medications on file as of 11/28/2023.    Allergies (verified) Zetia  [ezetimibe ], Prednisone , Statins, and Strattera [atomoxetine hcl]   History: Past Medical History:  Diagnosis Date   Allergy    Anxiety    Attention deficit disorder (ADD)    Cancer (HCC)    ? basal/squam cell on chest, and forehead   Depression    Dry eyes    GERD (gastroesophageal reflux disease)    diet controlled, no meds   HLD (hyperlipidemia)    HSV infection    Lumbar degenerative disc disease    Ocular rosacea    Osteopenia    Personal history of radiation therapy    Past Surgical History:  Procedure Laterality Date   BREAST BIOPSY Left 05/28/2019  BREAST LUMPECTOMY Left 07/18/2019   Procedure: LEFT BREAST LUMPECTOMY;  Surgeon: Vernetta Berg, MD;  Location: [facility];  Service: General;  Laterality: Left;  LMA   COLONOSCOPY  2019   RE-EXCISION OF BREAST LUMPECTOMY Left 09/05/2019   Procedure: RE-EXCISION OF LEFT BREAST TUMOR;  Surgeon: Vernetta Berg, MD;  Location: [facility] OR;  Service: General;  Laterality: Left;  LMA   SKIN BIOPSY     ?basal/squam cell on chest   TONSILLECTOMY AND ADENOIDECTOMY     TOTAL KNEE ARTHROPLASTY Right 03/04/2019   Procedure: RIGHT TOTAL KNEE ARTHROPLASTY;  Surgeon: Barbarann Oneil BROCKS, MD;  Location: [facility] OR;  Service:  Orthopedics;  Laterality: Right;   UPPER GI ENDOSCOPY     Family History  Problem Relation Age of Onset   Diabetes Father    Hypertension Mother    Depression Mother    Macular degeneration Mother    Heart disease Mother    Social History   Socioeconomic History   Marital status: Married    Spouse name: Not on file   Number of children: Not on file   Years of education: Not on file   Highest education level: Not on file  Occupational History   Not on file  Tobacco Use   Smoking status: Former    Current packs/day: 0.00    Average packs/day: 0.5 packs/day for 15.0 years (7.5 ttl pk-yrs)    Types: Cigarettes    Start date: 03/23/1965    Quit date: 03/23/1980    Years since quitting: 43.7   Smokeless tobacco: Never  Vaping Use   Vaping status: Never Used  Substance and Sexual Activity   Alcohol use: Yes    Alcohol/week: 4.0 standard drinks of alcohol    Types: 2 Glasses of wine, 2 Standard drinks or equivalent per week    Comment: social   Drug use: No   Sexual activity: Yes    Birth control/protection: Post-menopausal  Other Topics Concern   Not on file  Social History Narrative   Not on file   Social Drivers of Health   Financial Resource Strain: Low Risk  (11/28/2023)   Overall Financial Resource Strain (CARDIA)    Difficulty of Paying Living Expenses: Not hard at all  Food Insecurity: No Food Insecurity (11/28/2023)   Hunger Vital Sign    Worried About Running Out of Food in the Last Year: Never true    Ran Out of Food in the Last Year: Never true  Transportation Needs: No Transportation Needs (11/28/2023)   PRAPARE - Administrator, Civil Service (Medical): No    Lack of Transportation (Non-Medical): No  Physical Activity: Inactive (11/28/2023)   Exercise Vital Sign    Days of Exercise per Week: 0 days    Minutes of Exercise per Session: 0 min  Stress: No Stress Concern Present (11/28/2023)   [employer] of Occupational Health - Occupational  Stress Questionnaire    Feeling of Stress: Not at all  Social Connections: Moderately Integrated (11/28/2023)   Social Connection and Isolation Panel    Frequency of Communication with Friends and Family: More than three times a week    Frequency of Social Gatherings with Friends and Family: More than three times a week    Attends Religious Services: Never    Database administrator or Organizations: Yes    Attends Engineer, structural: More than 4 times per year    Marital Status: Married    Tobacco Counseling  Counseling given: Not Answered   Clinical Intake:  Pre-visit preparation completed: Yes  Pain : 0-10 Pain Score: 3  Pain Type: Chronic pain Pain Location: Back Pain Descriptors / Indicators: Burning, Aching Pain Onset: More than a month ago Pain Frequency: Intermittent     Diabetes: No  How often do you need to have someone help you when you read instructions, pamphlets, or other written materials from your doctor or pharmacy?: 1 - Never  Interpreter Needed?: No  Information entered by :: Mliss Graff LPN   Activities of Daily Living    11/28/2023    8:18 AM 01/19/2023    2:13 AM  In your present state of health, do you have any difficulty performing the following activities:  Hearing? 0 0  Vision? 0 0  Difficulty concentrating or making decisions? 0 0  Walking or climbing stairs? 0 0  Dressing or bathing? 0 0  Doing errands, shopping? 0 0  Preparing Food and eating ? N   Using the Toilet? N   In the past six months, have you accidently leaked urine? Y   Do you have problems with loss of bowel control? N   Managing your Medications? N   Managing your Finances? N   Housekeeping or managing your Housekeeping? N     Patient Care Team: [place], Quintin PARAS, MD as PCP - General (Family Medicine) Mat Browning, MD as Consulting Physician (Obstetrics and Gynecology) Octavia Charleston, MD as Consulting Physician (Ophthalmology) Dwane Lerner, MD as  Consulting Physician (Dermatology) Vernetta Berg, MD as Consulting Physician (General Surgery) Izell Domino, MD as Attending Physician (Radiation Oncology) Barbarann Oneil BROCKS, MD (Inactive) as Consulting Physician (Orthopedic Surgery) Tyree Nanetta SAILOR, RN as Oncology Nurse Navigator  Indicate any recent Medical Services you may have received from other than [facility] providers in the past year (date may be approximate).     Assessment:   This is a routine wellness examination for [place].  Hearing/Vision screen Hearing Screening - Comments:: No trouble hearing Vision Screening - Comments:: [facility] Up to date   Goals Addressed             This Visit's Progress    Weight (lb) < 200 lb (90.7 kg)   187 lb (84.8 kg)      Depression Screen    11/28/2023    8:23 AM 11/24/2023    9:38 AM 10/12/2023    2:44 PM 01/19/2023    2:12 AM 09/28/2022   12:32 PM 05/31/2022   12:07 AM 11/23/2021    9:36 PM  PHQ 2/9 Scores  PHQ - 2 Score 0 0 0 0 0 0 0  PHQ- 9 Score 4 0 2        Fall Risk    11/28/2023    8:16 AM 11/24/2023    9:38 AM 01/19/2023    2:12 AM 09/28/2022   12:32 PM 05/31/2022   12:06 AM  Fall Risk   Falls in the past year? 0 0 0 0 0  Number falls in past yr: 0 0     Injury with Fall? 0 0     Risk for fall due to :  No Fall Risks No Fall Risks  No Fall Risks  Follow up Falls evaluation completed;Education provided;Falls prevention discussed Falls evaluation completed Education provided;Falls prevention discussed;Falls evaluation completed  Falls evaluation completed;Education provided;Falls prevention discussed    MEDICARE RISK AT HOME: Medicare Risk at Home Any stairs in or around the home?: Yes If so, are  there any without handrails?: No Home free of loose throw rugs in walkways, pet beds, electrical cords, etc?: Yes Adequate lighting in your home to reduce risk of falls?: Yes Life alert?: No Use of a cane, walker or w/c?: No Grab bars in the bathroom?: No Shower chair or  bench in shower?: Yes Elevated toilet seat or a handicapped toilet?: No  TIMED UP AND GO:  Was the test performed?  No    Cognitive Function:        11/28/2023    8:20 AM  6CIT Screen  What Year? 0 points  What month? 0 points  What time? 0 points  Count back from 20 0 points  Months in reverse 0 points  Repeat phrase 0 points  Total Score 0 points    Immunizations Immunization History  Administered Date(s) Administered   DT (Pediatric) 01/14/2015   INFLUENZA, HIGH DOSE SEASONAL PF 04/30/2018, 11/13/2018, 01/06/2020, 01/19/2023   PFIZER(Purple Top)SARS-COV-2 Vaccination 04/07/2019, 05/01/2019   Pneumococcal Conjugate-13 03/24/2015   Pneumococcal-Unspecified 12/31/2012   Td 03/29/2004    TDAP status: Due, Education has been provided regarding the importance of this vaccine. Advised may receive this vaccine at local pharmacy or Health Dept. Aware to provide a copy of the vaccination record if obtained from local pharmacy or Health Dept. Verbalized acceptance and understanding.  Flu Vaccine status: Due, Education has been provided regarding the importance of this vaccine. Advised may receive this vaccine at local pharmacy or Health Dept. Aware to provide a copy of the vaccination record if obtained from local pharmacy or Health Dept. Verbalized acceptance and understanding.  Pneumococcal vaccine status: Due, Education has been provided regarding the importance of this vaccine. Advised may receive this vaccine at local pharmacy or Health Dept. Aware to provide a copy of the vaccination record if obtained from local pharmacy or Health Dept. Verbalized acceptance and understanding.  Covid-19 vaccine status: Declined, Education has been provided regarding the importance of this vaccine but patient still declined. Advised may receive this vaccine at local pharmacy or Health Dept.or vaccine clinic. Aware to provide a copy of the vaccination record if obtained from local pharmacy or  Health Dept. Verbalized acceptance and understanding.  Qualifies for Shingles Vaccine? Yes   Zostavax completed No   Shingrix Completed?: No.    Education has been provided regarding the importance of this vaccine. Patient has been advised to call insurance company to determine out of pocket expense if they have not yet received this vaccine. Advised may also receive vaccine at local pharmacy or Health Dept. Verbalized acceptance and understanding.  Screening Tests Health Maintenance  Topic Date Due   Zoster Vaccines- Shingrix (1 of 2) Never done   Pneumococcal Vaccine: 50+ Years (2 of 2 - PPSV23, PCV20, or PCV21) 05/19/2015   Influenza Vaccine  05/28/2024 (Originally 09/29/2023)   Medicare Annual Wellness (AWV)  11/27/2024   DTaP/Tdap/Td (3 - Tdap) 01/13/2025   Colonoscopy  07/13/2027   DEXA SCAN  Completed   Hepatitis C Screening  Completed   HPV VACCINES  Aged Out   Meningococcal B Vaccine  Aged Out   Mammogram  Discontinued   COVID-19 Vaccine  Discontinued    Health Maintenance  Health Maintenance Due  Topic Date Due   Zoster Vaccines- Shingrix (1 of 2) Never done   Pneumococcal Vaccine: 50+ Years (2 of 2 - PPSV23, PCV20, or PCV21) 05/19/2015    Colorectal cancer screening: No longer required.   Mammogram status: Completed  . Repeat every year  Bone Density Physician at [facility] due 2026  Lung Cancer Screening: (Low Dose CT Chest recommended if Age 76-80 years, 20 pack-year currently smoking OR have quit w/in 15years.) does not qualify.   Lung Cancer Screening Referral:   Additional Screening:  Hepatitis C Screening: does not qualify;  Vision Screening: Recommended annual ophthalmology exams for early detection of glaucoma and other disorders of the eye. Is the patient up to date with their annual eye exam?  Yes  Who is the provider or what is the name of the office in which the patient attends annual eye exams? [facility] If pt is not established with a provider, would  they like to be referred to a provider to establish care? No .   Dental Screening: Recommended annual dental exams for proper oral hygiene   Community Resource Referral / Chronic Care Management: CRR required this visit?  No   CCM required this visit?  No     Plan:     I have personally reviewed and noted the following in the patient's chart:   Medical and social history Use of alcohol, tobacco or illicit drugs  Current medications and supplements including opioid prescriptions. Patient is not currently taking opioid prescriptions. Functional ability and status Nutritional status Physical activity Advanced directives List of other physicians Hospitalizations, surgeries, and ER visits in previous 12 months Vitals Screenings to include cognitive, depression, and falls Referrals and appointments  In addition, I have reviewed and discussed with patient certain preventive protocols, quality metrics, and best practice recommendations. A written personalized care plan for preventive services as well as general preventive health recommendations were provided to patient.     Mliss Graff, LPN   [id number]   After Visit Summary: (MyChart) Due to this being a telephonic visit, the after visit summary with patients personalized plan was offered to patient via MyChart   Nurse Notes:

## 2024-01-12 ENCOUNTER — Ambulatory Visit (HOSPITAL_BASED_OUTPATIENT_CLINIC_OR_DEPARTMENT_OTHER): Admitting: Family Medicine

## 2024-01-12 ENCOUNTER — Encounter (HOSPITAL_BASED_OUTPATIENT_CLINIC_OR_DEPARTMENT_OTHER): Payer: Self-pay | Admitting: Family Medicine

## 2024-01-12 VITALS — BP 163/77 | HR 67 | Temp 98.6°F | Wt 188.0 lb

## 2024-01-12 DIAGNOSIS — E785 Hyperlipidemia, unspecified: Secondary | ICD-10-CM

## 2024-01-12 DIAGNOSIS — E559 Vitamin D deficiency, unspecified: Secondary | ICD-10-CM | POA: Diagnosis not present

## 2024-01-12 DIAGNOSIS — F900 Attention-deficit hyperactivity disorder, predominantly inattentive type: Secondary | ICD-10-CM | POA: Diagnosis not present

## 2024-01-12 DIAGNOSIS — R7303 Prediabetes: Secondary | ICD-10-CM

## 2024-01-12 DIAGNOSIS — Z23 Encounter for immunization: Secondary | ICD-10-CM | POA: Diagnosis not present

## 2024-01-12 DIAGNOSIS — R0989 Other specified symptoms and signs involving the circulatory and respiratory systems: Secondary | ICD-10-CM

## 2024-01-12 MED ORDER — AMPHETAMINE-DEXTROAMPHETAMINE 30 MG PO TABS: ORAL_TABLET | ORAL | 0 refills | Status: DC

## 2024-01-12 NOTE — Progress Notes (Signed)
    Procedures performed today:    None.  Independent interpretation of notes and tests performed by another provider:   None.  Brief History, Exam, Impression, and Recommendations:    BP (!) 163/77 (BP Location: Left Arm, Patient Position: Sitting, Cuff Size: Normal)   Pulse 67   Temp 98.6 F (37 C)   Wt 188 lb (85.3 kg)   LMP  (LMP Unknown)   SpO2 100%   BMI 34.39 kg/m   Hyperlipidemia, unspecified hyperlipidemia type Assessment & Plan: Patient was started on Repatha  with cardiology.  No current issues with medication.  She reports that she will be due for updated labs before she meets with cardiology next.  We have gone ahead and placed these orders and will have results forwarded to her cardiologist as well. Can continue with current medication regimen.  Would also recommend focusing on lifestyle modifications.  Will assess progress with labs before next appointment with cardiology  Orders: -     Lipid panel; Future -     NMR, lipoprofile; Future -     Apolipoprotein B; Future  Attention deficit hyperactivity disorder (ADHD), predominantly inattentive type Assessment & Plan: Patient continues with Adderall 30 mg.  Denies any issues with medication.  Indicates that she does need a refill today.  No reported concerns with chest pain, palpitations, sleep concerns. PDMP reviewed, no red flags.  Can proceed with refill today.  Orders: -     Amphetamine -Dextroamphetamine ; Take 1 tablet in the morning and 0.5 tablet  at noon. [employer]EV[employer] BRAND ONLY  Dispense: 45 tablet; Refill: 0  Vitamin D  deficiency Assessment & Plan: Recommend continuing with supplementation as needed to maintain vitamin D  within normal range. Will plan to recheck vitamin D  level before next visit  Orders: -     VITAMIN D  25 Hydroxy (Vit-D Deficiency, Fractures); Future  Prediabetes Assessment & Plan: Recommend continuing with lifestyle modifications.  We can keep a close eye on hemoglobin A1c monitoring.   Plan to check A1c before next visit  Orders: -     Hemoglobin A1c; Future  Labile hypertension Assessment & Plan: Blood pressure is elevated in office today, she continues to note issue with our cuff squeezing tighter and causing a lot of discomfort for her compared to when she checks at home or other offices.  On reviewing chart, blood pressure at office visits for other providers show better control. For now can continue with monitoring, not currently taking any medications for blood pressure.  Recommend intermittent monitoring of blood pressure at home, DASH diet.  Orders: -     CBC with Differential/Platelet; Future -     Comprehensive metabolic panel with GFR; Future -     TSH Rfx on Abnormal to Free T4; Future  Immunization due -     Pneumococcal Conjugate PCV21(Capvaxive)  Return in about 6 months (around 07/11/2024) for hypertension, prediabetes, with fasting labs 1 week prior.   ___________________________________________ Kathy Asencio de [place], MD, ABFM, CAQSM [facility]

## 2024-01-18 NOTE — Assessment & Plan Note (Signed)
 Blood pressure is elevated in office today, she continues to note issue with our cuff squeezing tighter and causing a lot of discomfort for her compared to when she checks at home or other offices.  On reviewing chart, blood pressure at office visits for other providers show better control. For now can continue with monitoring, not currently taking any medications for blood pressure.  Recommend intermittent monitoring of blood pressure at home, DASH diet.

## 2024-01-18 NOTE — Assessment & Plan Note (Signed)
 Patient was started on Repatha  with cardiology.  No current issues with medication.  She reports that she will be due for updated labs before she meets with cardiology next.  We have gone ahead and placed these orders and will have results forwarded to her cardiologist as well. Can continue with current medication regimen.  Would also recommend focusing on lifestyle modifications.  Will assess progress with labs before next appointment with cardiology

## 2024-01-18 NOTE — Assessment & Plan Note (Signed)
 Recommend continuing with supplementation as needed to maintain vitamin D  within normal range. Will plan to recheck vitamin D  level before next visit

## 2024-01-18 NOTE — Assessment & Plan Note (Signed)
 Recommend continuing with lifestyle modifications.  We can keep a close eye on hemoglobin A1c monitoring.  Plan to check A1c before next visit

## 2024-01-18 NOTE — Assessment & Plan Note (Signed)
 Patient continues with Adderall 30 mg.  Denies any issues with medication.  Indicates that she does need a refill today.  No reported concerns with chest pain, palpitations, sleep concerns. PDMP reviewed, no red flags.  Can proceed with refill today.

## 2024-01-23 ENCOUNTER — Other Ambulatory Visit: Payer: Self-pay | Admitting: Internal Medicine

## 2024-03-14 ENCOUNTER — Telehealth (HOSPITAL_BASED_OUTPATIENT_CLINIC_OR_DEPARTMENT_OTHER): Payer: Self-pay | Admitting: Family Medicine

## 2024-03-14 DIAGNOSIS — F900 Attention-deficit hyperactivity disorder, predominantly inattentive type: Secondary | ICD-10-CM

## 2024-03-14 NOTE — Telephone Encounter (Signed)
 Sent to wrong pool.

## 2024-03-14 NOTE — Telephone Encounter (Signed)
 Copied from CRM [id number]. Topic: Clinical - Prescription Issue >> Mar 14, 2024  2:01 PM Emylou G wrote: Reason for CRM: Patient called requesting if we can send  [employer] Brand of  amphetamine -dextroamphetamine  (ADDERALL) 30 MG tablet.[facility] [address] [place], [place] [address] [phone]  She is out of town and would like it to be sent there.[facility]  Pls call her if we can do this

## 2024-03-15 ENCOUNTER — Encounter (HOSPITAL_BASED_OUTPATIENT_CLINIC_OR_DEPARTMENT_OTHER): Payer: Self-pay | Admitting: Family Medicine

## 2024-03-15 MED ORDER — AMPHETAMINE-DEXTROAMPHETAMINE 30 MG PO TABS: ORAL_TABLET | ORAL | 0 refills | Status: AC

## 2024-07-05 ENCOUNTER — Ambulatory Visit (HOSPITAL_BASED_OUTPATIENT_CLINIC_OR_DEPARTMENT_OTHER)

## 2024-07-12 ENCOUNTER — Ambulatory Visit (HOSPITAL_BASED_OUTPATIENT_CLINIC_OR_DEPARTMENT_OTHER): Admitting: Family Medicine

## 2024-12-24 ENCOUNTER — Ambulatory Visit (HOSPITAL_BASED_OUTPATIENT_CLINIC_OR_DEPARTMENT_OTHER)
